# Patient Record
Sex: Male | Born: 1945 | Race: Black or African American | Hispanic: No | Marital: Married | Smoking: Former smoker
Health system: Southern US, Community
[De-identification: ages and names within clinical notes are randomized; demographics above are authoritative.]

## PROBLEM LIST (undated history)

## (undated) DIAGNOSIS — H409 Unspecified glaucoma: Secondary | ICD-10-CM

## (undated) DIAGNOSIS — M1990 Unspecified osteoarthritis, unspecified site: Secondary | ICD-10-CM

## (undated) DIAGNOSIS — Z9861 Coronary angioplasty status: Principal | ICD-10-CM

## (undated) DIAGNOSIS — E119 Type 2 diabetes mellitus without complications: Secondary | ICD-10-CM

## (undated) DIAGNOSIS — R112 Nausea with vomiting, unspecified: Secondary | ICD-10-CM

## (undated) DIAGNOSIS — R21 Rash and other nonspecific skin eruption: Secondary | ICD-10-CM

## (undated) DIAGNOSIS — C649 Malignant neoplasm of unspecified kidney, except renal pelvis: Secondary | ICD-10-CM

## (undated) DIAGNOSIS — R51 Headache: Secondary | ICD-10-CM

## (undated) DIAGNOSIS — N189 Chronic kidney disease, unspecified: Secondary | ICD-10-CM

## (undated) DIAGNOSIS — IMO0001 Reserved for inherently not codable concepts without codable children: Secondary | ICD-10-CM

## (undated) DIAGNOSIS — R011 Cardiac murmur, unspecified: Secondary | ICD-10-CM

## (undated) DIAGNOSIS — Z87442 Personal history of urinary calculi: Secondary | ICD-10-CM

## (undated) DIAGNOSIS — C61 Malignant neoplasm of prostate: Secondary | ICD-10-CM

## (undated) DIAGNOSIS — M109 Gout, unspecified: Secondary | ICD-10-CM

## (undated) DIAGNOSIS — I712 Thoracic aortic aneurysm, without rupture: Secondary | ICD-10-CM

## (undated) DIAGNOSIS — E785 Hyperlipidemia, unspecified: Secondary | ICD-10-CM

## (undated) DIAGNOSIS — Z9989 Dependence on other enabling machines and devices: Secondary | ICD-10-CM

## (undated) DIAGNOSIS — I2510 Atherosclerotic heart disease of native coronary artery without angina pectoris: Secondary | ICD-10-CM

## (undated) DIAGNOSIS — Z9889 Other specified postprocedural states: Secondary | ICD-10-CM

## (undated) DIAGNOSIS — R519 Headache, unspecified: Secondary | ICD-10-CM

## (undated) DIAGNOSIS — G4733 Obstructive sleep apnea (adult) (pediatric): Secondary | ICD-10-CM

## (undated) DIAGNOSIS — I10 Essential (primary) hypertension: Secondary | ICD-10-CM

## (undated) HISTORY — DX: Gout, unspecified: M10.9

## (undated) HISTORY — DX: Coronary angioplasty status: Z98.61

## (undated) HISTORY — DX: Unspecified glaucoma: H40.9

## (undated) HISTORY — DX: Obstructive sleep apnea (adult) (pediatric): G47.33

## (undated) HISTORY — PX: EYE SURGERY: SHX253

## (undated) HISTORY — DX: Reserved for inherently not codable concepts without codable children: IMO0001

## (undated) HISTORY — DX: Dependence on other enabling machines and devices: Z99.89

## (undated) HISTORY — DX: Essential (primary) hypertension: I10

## (undated) HISTORY — PX: NM MYOVIEW (ARMC HX): HXRAD1857

## (undated) HISTORY — PX: PROSTATECTOMY: SHX69

## (undated) HISTORY — PX: COLONOSCOPY W/ BIOPSIES AND POLYPECTOMY: SHX1376

## (undated) HISTORY — DX: Atherosclerotic heart disease of native coronary artery without angina pectoris: I25.10

## (undated) HISTORY — DX: Hyperlipidemia, unspecified: E78.5

## (undated) HISTORY — DX: Thoracic aortic aneurysm, without rupture: I71.2

## (undated) HISTORY — PX: REFRACTIVE SURGERY: SHX103

---

## 1999-11-07 HISTORY — PX: CORONARY ANGIOPLASTY WITH STENT PLACEMENT: SHX49

## 2004-08-27 ENCOUNTER — Emergency Department (HOSPITAL_COMMUNITY): Admission: EM | Admit: 2004-08-27 | Discharge: 2004-08-27 | Payer: Self-pay | Admitting: Emergency Medicine

## 2005-11-06 DIAGNOSIS — C61 Malignant neoplasm of prostate: Secondary | ICD-10-CM

## 2005-11-06 HISTORY — DX: Malignant neoplasm of prostate: C61

## 2008-01-23 ENCOUNTER — Emergency Department (HOSPITAL_COMMUNITY): Admission: EM | Admit: 2008-01-23 | Discharge: 2008-01-23 | Payer: Self-pay | Admitting: Emergency Medicine

## 2009-11-06 DIAGNOSIS — I2510 Atherosclerotic heart disease of native coronary artery without angina pectoris: Secondary | ICD-10-CM

## 2009-11-06 HISTORY — DX: Atherosclerotic heart disease of native coronary artery without angina pectoris: I25.10

## 2009-11-06 HISTORY — PX: CORONARY ANGIOPLASTY WITH STENT PLACEMENT: SHX49

## 2011-11-10 DIAGNOSIS — E119 Type 2 diabetes mellitus without complications: Secondary | ICD-10-CM | POA: Diagnosis not present

## 2011-11-16 DIAGNOSIS — M25569 Pain in unspecified knee: Secondary | ICD-10-CM | POA: Diagnosis not present

## 2011-11-16 DIAGNOSIS — M25849 Other specified joint disorders, unspecified hand: Secondary | ICD-10-CM | POA: Diagnosis not present

## 2011-11-16 DIAGNOSIS — S6980XA Other specified injuries of unspecified wrist, hand and finger(s), initial encounter: Secondary | ICD-10-CM | POA: Diagnosis not present

## 2011-11-16 DIAGNOSIS — E291 Testicular hypofunction: Secondary | ICD-10-CM | POA: Diagnosis not present

## 2011-11-16 DIAGNOSIS — M25469 Effusion, unspecified knee: Secondary | ICD-10-CM | POA: Diagnosis not present

## 2011-11-16 DIAGNOSIS — E119 Type 2 diabetes mellitus without complications: Secondary | ICD-10-CM | POA: Diagnosis not present

## 2011-11-16 DIAGNOSIS — M25869 Other specified joint disorders, unspecified knee: Secondary | ICD-10-CM | POA: Diagnosis not present

## 2011-11-16 DIAGNOSIS — M25549 Pain in joints of unspecified hand: Secondary | ICD-10-CM | POA: Diagnosis not present

## 2011-12-08 DIAGNOSIS — H01009 Unspecified blepharitis unspecified eye, unspecified eyelid: Secondary | ICD-10-CM | POA: Diagnosis not present

## 2011-12-08 DIAGNOSIS — H01119 Allergic dermatitis of unspecified eye, unspecified eyelid: Secondary | ICD-10-CM | POA: Diagnosis not present

## 2011-12-08 DIAGNOSIS — H02409 Unspecified ptosis of unspecified eyelid: Secondary | ICD-10-CM | POA: Diagnosis not present

## 2011-12-25 DIAGNOSIS — E291 Testicular hypofunction: Secondary | ICD-10-CM | POA: Diagnosis not present

## 2011-12-25 DIAGNOSIS — E789 Disorder of lipoprotein metabolism, unspecified: Secondary | ICD-10-CM | POA: Diagnosis not present

## 2011-12-25 DIAGNOSIS — E119 Type 2 diabetes mellitus without complications: Secondary | ICD-10-CM | POA: Diagnosis not present

## 2011-12-25 DIAGNOSIS — R05 Cough: Secondary | ICD-10-CM | POA: Diagnosis not present

## 2011-12-27 DIAGNOSIS — C61 Malignant neoplasm of prostate: Secondary | ICD-10-CM | POA: Diagnosis not present

## 2011-12-27 DIAGNOSIS — N529 Male erectile dysfunction, unspecified: Secondary | ICD-10-CM | POA: Diagnosis not present

## 2012-01-01 DIAGNOSIS — E119 Type 2 diabetes mellitus without complications: Secondary | ICD-10-CM | POA: Diagnosis not present

## 2012-01-01 DIAGNOSIS — R05 Cough: Secondary | ICD-10-CM | POA: Diagnosis not present

## 2012-01-18 DIAGNOSIS — E119 Type 2 diabetes mellitus without complications: Secondary | ICD-10-CM | POA: Diagnosis not present

## 2012-01-19 DIAGNOSIS — E789 Disorder of lipoprotein metabolism, unspecified: Secondary | ICD-10-CM | POA: Diagnosis not present

## 2012-01-19 DIAGNOSIS — E291 Testicular hypofunction: Secondary | ICD-10-CM | POA: Diagnosis not present

## 2012-01-19 DIAGNOSIS — I10 Essential (primary) hypertension: Secondary | ICD-10-CM | POA: Diagnosis not present

## 2012-01-19 DIAGNOSIS — E119 Type 2 diabetes mellitus without complications: Secondary | ICD-10-CM | POA: Diagnosis not present

## 2012-01-30 DIAGNOSIS — T380X5A Adverse effect of glucocorticoids and synthetic analogues, initial encounter: Secondary | ICD-10-CM | POA: Diagnosis not present

## 2012-01-30 DIAGNOSIS — H35039 Hypertensive retinopathy, unspecified eye: Secondary | ICD-10-CM | POA: Diagnosis not present

## 2012-01-30 DIAGNOSIS — H4060X0 Glaucoma secondary to drugs, unspecified eye, stage unspecified: Secondary | ICD-10-CM | POA: Diagnosis not present

## 2012-01-30 DIAGNOSIS — H01009 Unspecified blepharitis unspecified eye, unspecified eyelid: Secondary | ICD-10-CM | POA: Diagnosis not present

## 2012-01-30 DIAGNOSIS — E119 Type 2 diabetes mellitus without complications: Secondary | ICD-10-CM | POA: Diagnosis not present

## 2012-01-30 DIAGNOSIS — H2510 Age-related nuclear cataract, unspecified eye: Secondary | ICD-10-CM | POA: Diagnosis not present

## 2012-02-01 DIAGNOSIS — H04129 Dry eye syndrome of unspecified lacrimal gland: Secondary | ICD-10-CM | POA: Diagnosis not present

## 2012-02-01 DIAGNOSIS — H401390 Pigmentary glaucoma, unspecified eye, stage unspecified: Secondary | ICD-10-CM | POA: Diagnosis not present

## 2012-02-01 DIAGNOSIS — H409 Unspecified glaucoma: Secondary | ICD-10-CM | POA: Diagnosis not present

## 2012-02-01 DIAGNOSIS — H2510 Age-related nuclear cataract, unspecified eye: Secondary | ICD-10-CM | POA: Diagnosis not present

## 2012-02-01 DIAGNOSIS — E119 Type 2 diabetes mellitus without complications: Secondary | ICD-10-CM | POA: Diagnosis not present

## 2012-02-15 DIAGNOSIS — H04129 Dry eye syndrome of unspecified lacrimal gland: Secondary | ICD-10-CM | POA: Diagnosis not present

## 2012-02-15 DIAGNOSIS — H409 Unspecified glaucoma: Secondary | ICD-10-CM | POA: Diagnosis not present

## 2012-02-15 DIAGNOSIS — H02409 Unspecified ptosis of unspecified eyelid: Secondary | ICD-10-CM | POA: Diagnosis not present

## 2012-02-15 DIAGNOSIS — H401390 Pigmentary glaucoma, unspecified eye, stage unspecified: Secondary | ICD-10-CM | POA: Diagnosis not present

## 2012-02-29 DIAGNOSIS — H40019 Open angle with borderline findings, low risk, unspecified eye: Secondary | ICD-10-CM | POA: Diagnosis not present

## 2012-02-29 DIAGNOSIS — H2510 Age-related nuclear cataract, unspecified eye: Secondary | ICD-10-CM | POA: Diagnosis not present

## 2012-02-29 DIAGNOSIS — H01009 Unspecified blepharitis unspecified eye, unspecified eyelid: Secondary | ICD-10-CM | POA: Diagnosis not present

## 2012-03-21 DIAGNOSIS — H409 Unspecified glaucoma: Secondary | ICD-10-CM | POA: Diagnosis not present

## 2012-03-21 DIAGNOSIS — H401390 Pigmentary glaucoma, unspecified eye, stage unspecified: Secondary | ICD-10-CM | POA: Diagnosis not present

## 2012-04-18 DIAGNOSIS — H01009 Unspecified blepharitis unspecified eye, unspecified eyelid: Secondary | ICD-10-CM | POA: Diagnosis not present

## 2012-04-18 DIAGNOSIS — H11159 Pinguecula, unspecified eye: Secondary | ICD-10-CM | POA: Diagnosis not present

## 2012-04-18 DIAGNOSIS — H40059 Ocular hypertension, unspecified eye: Secondary | ICD-10-CM | POA: Diagnosis not present

## 2012-04-18 DIAGNOSIS — H401390 Pigmentary glaucoma, unspecified eye, stage unspecified: Secondary | ICD-10-CM | POA: Diagnosis not present

## 2012-04-22 DIAGNOSIS — I700 Atherosclerosis of aorta: Secondary | ICD-10-CM | POA: Diagnosis not present

## 2012-04-22 DIAGNOSIS — R918 Other nonspecific abnormal finding of lung field: Secondary | ICD-10-CM | POA: Diagnosis not present

## 2012-04-22 DIAGNOSIS — E1169 Type 2 diabetes mellitus with other specified complication: Secondary | ICD-10-CM | POA: Diagnosis not present

## 2012-04-22 DIAGNOSIS — R079 Chest pain, unspecified: Secondary | ICD-10-CM | POA: Diagnosis not present

## 2012-04-22 DIAGNOSIS — I2510 Atherosclerotic heart disease of native coronary artery without angina pectoris: Secondary | ICD-10-CM | POA: Diagnosis not present

## 2012-04-25 DIAGNOSIS — R0602 Shortness of breath: Secondary | ICD-10-CM | POA: Diagnosis not present

## 2012-04-25 DIAGNOSIS — R079 Chest pain, unspecified: Secondary | ICD-10-CM | POA: Diagnosis not present

## 2012-04-25 DIAGNOSIS — I10 Essential (primary) hypertension: Secondary | ICD-10-CM | POA: Diagnosis not present

## 2012-04-25 DIAGNOSIS — E782 Mixed hyperlipidemia: Secondary | ICD-10-CM | POA: Diagnosis not present

## 2012-05-01 DIAGNOSIS — E119 Type 2 diabetes mellitus without complications: Secondary | ICD-10-CM | POA: Diagnosis not present

## 2012-05-01 DIAGNOSIS — I2510 Atherosclerotic heart disease of native coronary artery without angina pectoris: Secondary | ICD-10-CM | POA: Diagnosis not present

## 2012-05-01 DIAGNOSIS — R079 Chest pain, unspecified: Secondary | ICD-10-CM | POA: Diagnosis not present

## 2012-05-01 DIAGNOSIS — R0602 Shortness of breath: Secondary | ICD-10-CM | POA: Diagnosis not present

## 2012-05-06 DIAGNOSIS — I2510 Atherosclerotic heart disease of native coronary artery without angina pectoris: Secondary | ICD-10-CM | POA: Diagnosis not present

## 2012-05-06 DIAGNOSIS — I10 Essential (primary) hypertension: Secondary | ICD-10-CM | POA: Diagnosis not present

## 2012-05-15 DIAGNOSIS — E789 Disorder of lipoprotein metabolism, unspecified: Secondary | ICD-10-CM | POA: Diagnosis not present

## 2012-05-16 DIAGNOSIS — H40059 Ocular hypertension, unspecified eye: Secondary | ICD-10-CM | POA: Diagnosis not present

## 2012-05-22 DIAGNOSIS — I10 Essential (primary) hypertension: Secondary | ICD-10-CM | POA: Diagnosis not present

## 2012-05-22 DIAGNOSIS — E789 Disorder of lipoprotein metabolism, unspecified: Secondary | ICD-10-CM | POA: Diagnosis not present

## 2012-05-22 DIAGNOSIS — E119 Type 2 diabetes mellitus without complications: Secondary | ICD-10-CM | POA: Diagnosis not present

## 2012-05-22 DIAGNOSIS — I509 Heart failure, unspecified: Secondary | ICD-10-CM | POA: Diagnosis not present

## 2012-05-28 DIAGNOSIS — R1310 Dysphagia, unspecified: Secondary | ICD-10-CM | POA: Diagnosis not present

## 2012-06-20 DIAGNOSIS — E119 Type 2 diabetes mellitus without complications: Secondary | ICD-10-CM | POA: Diagnosis not present

## 2012-06-20 DIAGNOSIS — E789 Disorder of lipoprotein metabolism, unspecified: Secondary | ICD-10-CM | POA: Diagnosis not present

## 2012-06-27 DIAGNOSIS — N529 Male erectile dysfunction, unspecified: Secondary | ICD-10-CM | POA: Diagnosis not present

## 2012-06-27 DIAGNOSIS — I10 Essential (primary) hypertension: Secondary | ICD-10-CM | POA: Diagnosis not present

## 2012-06-27 DIAGNOSIS — C61 Malignant neoplasm of prostate: Secondary | ICD-10-CM | POA: Diagnosis not present

## 2012-06-27 DIAGNOSIS — R350 Frequency of micturition: Secondary | ICD-10-CM | POA: Diagnosis not present

## 2012-06-27 DIAGNOSIS — E789 Disorder of lipoprotein metabolism, unspecified: Secondary | ICD-10-CM | POA: Diagnosis not present

## 2012-06-27 DIAGNOSIS — N318 Other neuromuscular dysfunction of bladder: Secondary | ICD-10-CM | POA: Diagnosis not present

## 2012-06-27 DIAGNOSIS — R3915 Urgency of urination: Secondary | ICD-10-CM | POA: Diagnosis not present

## 2012-06-27 DIAGNOSIS — E119 Type 2 diabetes mellitus without complications: Secondary | ICD-10-CM | POA: Diagnosis not present

## 2012-07-04 DIAGNOSIS — C61 Malignant neoplasm of prostate: Secondary | ICD-10-CM | POA: Diagnosis not present

## 2012-07-10 DIAGNOSIS — R195 Other fecal abnormalities: Secondary | ICD-10-CM | POA: Diagnosis not present

## 2012-07-10 DIAGNOSIS — R0602 Shortness of breath: Secondary | ICD-10-CM | POA: Diagnosis not present

## 2012-07-10 DIAGNOSIS — Z86010 Personal history of colonic polyps: Secondary | ICD-10-CM | POA: Diagnosis not present

## 2012-07-17 DIAGNOSIS — H409 Unspecified glaucoma: Secondary | ICD-10-CM | POA: Diagnosis not present

## 2012-07-17 DIAGNOSIS — H401390 Pigmentary glaucoma, unspecified eye, stage unspecified: Secondary | ICD-10-CM | POA: Diagnosis not present

## 2012-08-13 DIAGNOSIS — R1310 Dysphagia, unspecified: Secondary | ICD-10-CM | POA: Diagnosis not present

## 2012-08-21 DIAGNOSIS — E789 Disorder of lipoprotein metabolism, unspecified: Secondary | ICD-10-CM | POA: Diagnosis not present

## 2012-08-21 DIAGNOSIS — E119 Type 2 diabetes mellitus without complications: Secondary | ICD-10-CM | POA: Diagnosis not present

## 2012-08-21 DIAGNOSIS — I10 Essential (primary) hypertension: Secondary | ICD-10-CM | POA: Diagnosis not present

## 2012-08-21 DIAGNOSIS — E291 Testicular hypofunction: Secondary | ICD-10-CM | POA: Diagnosis not present

## 2012-08-28 DIAGNOSIS — L909 Atrophic disorder of skin, unspecified: Secondary | ICD-10-CM | POA: Diagnosis not present

## 2012-08-28 DIAGNOSIS — E789 Disorder of lipoprotein metabolism, unspecified: Secondary | ICD-10-CM | POA: Diagnosis not present

## 2012-08-28 DIAGNOSIS — E119 Type 2 diabetes mellitus without complications: Secondary | ICD-10-CM | POA: Diagnosis not present

## 2012-08-28 DIAGNOSIS — L919 Hypertrophic disorder of the skin, unspecified: Secondary | ICD-10-CM | POA: Diagnosis not present

## 2012-08-28 DIAGNOSIS — Z23 Encounter for immunization: Secondary | ICD-10-CM | POA: Diagnosis not present

## 2012-08-28 DIAGNOSIS — E291 Testicular hypofunction: Secondary | ICD-10-CM | POA: Diagnosis not present

## 2012-08-28 DIAGNOSIS — I10 Essential (primary) hypertension: Secondary | ICD-10-CM | POA: Diagnosis not present

## 2012-09-25 DIAGNOSIS — I10 Essential (primary) hypertension: Secondary | ICD-10-CM | POA: Diagnosis not present

## 2012-10-23 DIAGNOSIS — I10 Essential (primary) hypertension: Secondary | ICD-10-CM | POA: Diagnosis not present

## 2012-10-25 DIAGNOSIS — E119 Type 2 diabetes mellitus without complications: Secondary | ICD-10-CM | POA: Diagnosis not present

## 2012-10-25 DIAGNOSIS — I10 Essential (primary) hypertension: Secondary | ICD-10-CM | POA: Diagnosis not present

## 2012-10-25 DIAGNOSIS — E789 Disorder of lipoprotein metabolism, unspecified: Secondary | ICD-10-CM | POA: Diagnosis not present

## 2012-11-20 DIAGNOSIS — I10 Essential (primary) hypertension: Secondary | ICD-10-CM | POA: Diagnosis not present

## 2012-11-20 DIAGNOSIS — Z9861 Coronary angioplasty status: Secondary | ICD-10-CM | POA: Diagnosis not present

## 2012-11-20 DIAGNOSIS — E782 Mixed hyperlipidemia: Secondary | ICD-10-CM | POA: Diagnosis not present

## 2012-11-20 DIAGNOSIS — I2510 Atherosclerotic heart disease of native coronary artery without angina pectoris: Secondary | ICD-10-CM | POA: Diagnosis not present

## 2012-12-10 DIAGNOSIS — K625 Hemorrhage of anus and rectum: Secondary | ICD-10-CM | POA: Diagnosis not present

## 2012-12-10 DIAGNOSIS — R079 Chest pain, unspecified: Secondary | ICD-10-CM | POA: Diagnosis not present

## 2012-12-10 DIAGNOSIS — I10 Essential (primary) hypertension: Secondary | ICD-10-CM | POA: Diagnosis not present

## 2013-01-01 DIAGNOSIS — E789 Disorder of lipoprotein metabolism, unspecified: Secondary | ICD-10-CM | POA: Diagnosis not present

## 2013-01-01 DIAGNOSIS — G4730 Sleep apnea, unspecified: Secondary | ICD-10-CM | POA: Diagnosis not present

## 2013-01-01 DIAGNOSIS — I10 Essential (primary) hypertension: Secondary | ICD-10-CM | POA: Diagnosis not present

## 2013-01-01 DIAGNOSIS — E119 Type 2 diabetes mellitus without complications: Secondary | ICD-10-CM | POA: Diagnosis not present

## 2013-01-14 DIAGNOSIS — K921 Melena: Secondary | ICD-10-CM | POA: Diagnosis not present

## 2013-01-14 DIAGNOSIS — R195 Other fecal abnormalities: Secondary | ICD-10-CM | POA: Diagnosis not present

## 2013-02-05 DIAGNOSIS — G4733 Obstructive sleep apnea (adult) (pediatric): Secondary | ICD-10-CM | POA: Diagnosis not present

## 2013-02-18 DIAGNOSIS — G4733 Obstructive sleep apnea (adult) (pediatric): Secondary | ICD-10-CM | POA: Diagnosis not present

## 2013-03-05 DIAGNOSIS — K625 Hemorrhage of anus and rectum: Secondary | ICD-10-CM | POA: Diagnosis not present

## 2013-03-05 DIAGNOSIS — K219 Gastro-esophageal reflux disease without esophagitis: Secondary | ICD-10-CM | POA: Diagnosis not present

## 2013-03-05 DIAGNOSIS — M545 Low back pain: Secondary | ICD-10-CM | POA: Diagnosis not present

## 2013-03-12 DIAGNOSIS — G4733 Obstructive sleep apnea (adult) (pediatric): Secondary | ICD-10-CM | POA: Diagnosis not present

## 2013-03-14 DIAGNOSIS — H4010X0 Unspecified open-angle glaucoma, stage unspecified: Secondary | ICD-10-CM | POA: Diagnosis not present

## 2013-03-26 DIAGNOSIS — E789 Disorder of lipoprotein metabolism, unspecified: Secondary | ICD-10-CM | POA: Diagnosis not present

## 2013-03-28 DIAGNOSIS — H4010X0 Unspecified open-angle glaucoma, stage unspecified: Secondary | ICD-10-CM | POA: Diagnosis not present

## 2013-04-02 DIAGNOSIS — E119 Type 2 diabetes mellitus without complications: Secondary | ICD-10-CM | POA: Diagnosis not present

## 2013-04-02 DIAGNOSIS — I10 Essential (primary) hypertension: Secondary | ICD-10-CM | POA: Diagnosis not present

## 2013-04-02 DIAGNOSIS — G4730 Sleep apnea, unspecified: Secondary | ICD-10-CM | POA: Diagnosis not present

## 2013-04-14 DIAGNOSIS — H4010X0 Unspecified open-angle glaucoma, stage unspecified: Secondary | ICD-10-CM | POA: Diagnosis not present

## 2013-05-05 DIAGNOSIS — H4010X0 Unspecified open-angle glaucoma, stage unspecified: Secondary | ICD-10-CM | POA: Diagnosis not present

## 2013-06-04 ENCOUNTER — Ambulatory Visit (INDEPENDENT_AMBULATORY_CARE_PROVIDER_SITE_OTHER): Payer: Medicare Other | Admitting: Cardiology

## 2013-06-04 ENCOUNTER — Encounter: Payer: Self-pay | Admitting: Cardiology

## 2013-06-04 VITALS — BP 134/70 | HR 60 | Ht 72.0 in | Wt 275.5 lb

## 2013-06-04 DIAGNOSIS — Z9861 Coronary angioplasty status: Secondary | ICD-10-CM | POA: Diagnosis not present

## 2013-06-04 DIAGNOSIS — R0609 Other forms of dyspnea: Secondary | ICD-10-CM

## 2013-06-04 DIAGNOSIS — E119 Type 2 diabetes mellitus without complications: Secondary | ICD-10-CM

## 2013-06-04 DIAGNOSIS — G4733 Obstructive sleep apnea (adult) (pediatric): Secondary | ICD-10-CM | POA: Diagnosis not present

## 2013-06-04 DIAGNOSIS — I10 Essential (primary) hypertension: Secondary | ICD-10-CM

## 2013-06-04 DIAGNOSIS — R0989 Other specified symptoms and signs involving the circulatory and respiratory systems: Secondary | ICD-10-CM | POA: Diagnosis not present

## 2013-06-04 DIAGNOSIS — R0600 Dyspnea, unspecified: Secondary | ICD-10-CM

## 2013-06-04 DIAGNOSIS — E669 Obesity, unspecified: Secondary | ICD-10-CM

## 2013-06-04 DIAGNOSIS — I2510 Atherosclerotic heart disease of native coronary artery without angina pectoris: Secondary | ICD-10-CM

## 2013-06-04 DIAGNOSIS — Z794 Long term (current) use of insulin: Secondary | ICD-10-CM

## 2013-06-04 DIAGNOSIS — E785 Hyperlipidemia, unspecified: Secondary | ICD-10-CM

## 2013-06-04 NOTE — Patient Instructions (Addendum)
From a heart standpoint, your seem to be doing OK.  Your Blood Pressure is stable.  The swelling in your R ankle seems to be more related to a joint problem than fluid build up.   I am concerned about your weight gain.  We talked about some diet modifications, I would like for you to follow up with one of My PAs - Mr. Wilburt Finlay in ~3 months, then me in 6 months.  We are hoping to see some progress with decreasing your wgt.  He is a good motivator.  HARDING,DAVID W

## 2013-06-05 ENCOUNTER — Encounter: Payer: Self-pay | Admitting: Cardiology

## 2013-06-05 DIAGNOSIS — Z9989 Dependence on other enabling machines and devices: Secondary | ICD-10-CM | POA: Insufficient documentation

## 2013-06-05 DIAGNOSIS — I119 Hypertensive heart disease without heart failure: Secondary | ICD-10-CM | POA: Insufficient documentation

## 2013-06-05 DIAGNOSIS — E785 Hyperlipidemia, unspecified: Secondary | ICD-10-CM | POA: Insufficient documentation

## 2013-06-05 DIAGNOSIS — Z9861 Coronary angioplasty status: Secondary | ICD-10-CM | POA: Insufficient documentation

## 2013-06-05 DIAGNOSIS — R0600 Dyspnea, unspecified: Secondary | ICD-10-CM | POA: Insufficient documentation

## 2013-06-05 DIAGNOSIS — Z794 Long term (current) use of insulin: Secondary | ICD-10-CM | POA: Insufficient documentation

## 2013-06-05 DIAGNOSIS — I2510 Atherosclerotic heart disease of native coronary artery without angina pectoris: Secondary | ICD-10-CM | POA: Insufficient documentation

## 2013-06-05 DIAGNOSIS — E669 Obesity, unspecified: Secondary | ICD-10-CM | POA: Insufficient documentation

## 2013-06-05 DIAGNOSIS — E119 Type 2 diabetes mellitus without complications: Secondary | ICD-10-CM | POA: Insufficient documentation

## 2013-06-05 NOTE — Assessment & Plan Note (Signed)
He is on Vytorin and TriCor, prevertebral to be well-controlled. 3 diabetes mellitus primary physician. As close his ear was last checked about a minute good idea to check and a marked cystocele or stand. The anterior cruciate ligament was low before -- which should be helped with his increased activity his been doing.

## 2013-06-05 NOTE — Assessment & Plan Note (Signed)
Not really sure what to make of what this is. His EF was normal by the nuclear scan. The shortness really has gotten notable when he started putting on weight he is now about 24 pounds at what was last summer. I think some of these cysts shortness of breath when bending over or when just sitting there is do to his increased abdominal girth. When asked as substrate he was able to be breath following, this would insinuate that bending over causes some external restrictive problems, decreased ability of diaphragmatic contraction.Marland Kitchen

## 2013-06-05 NOTE — Assessment & Plan Note (Signed)
Stable, no active anginal symptoms. He is on a beta blocker, calcium channel blocker and ARB. He is also on statin and Plavix. He had been on aspirin so not sure why it was not listed. Stress test last year with no evidence of ischemia, excellent exercise tolerance.  Plan: Continue current therapy with a gush of risk modification.

## 2013-06-05 NOTE — Assessment & Plan Note (Signed)
His blood pressure was much better than it had been in the past. He is just on the borderline blood pressure today for his diabetes. His R. great dose of Bystolic and I think max dose of the Azor. When he is not on as a diuretic other than furosemide. Since he doesn't have that much leg edema, if his blood pressure increased to get this to switch furosemide for chlorthalidone.

## 2013-06-05 NOTE — Assessment & Plan Note (Signed)
Last but not least there is a 23 pound weight gain. We talked a lot about dietary modifications including simply removing the majority of the carbohydrates in his diet that or not noted in fruits and vegetables. Especially the simple carbohydrates, that are simply free calories. Hopefully once his back starts feeling better in his knees are feeling better, he'll get a get back into his routine exercise. We did talk about possibly doing water aerobics type activities while they're still bothering him. He may very well benefit from nutrition counseling session.  With that in mind and have him followup with Mr. Wilburt Finlay, PA in about 3 months to discuss continued lifestyle modification with dietary adjustment and exercise regimen plan. If he is agreeable at that time, we could even potentially consider referral to nutritionist.

## 2013-06-05 NOTE — Progress Notes (Signed)
Patient ID: Jacob Berger, male   DOB: 1946-07-26, 67 y.o.   MRN: [id number]  PCP: Michiel Sites, MD  Clinic Note: Chief Complaint  Patient presents with  . 6 month visit    no chest pain, little bit sob, edema in ankle ,    HPI: Jacob Berger is a 67 y.o. male with a PMH below who presents today for followup of his coronary artery disease time difficult control hypertension as well as dyslipidemia. He is a former patient of Dr. Caprice Kluver, who has a history of single vessel coronary disease, with PCI to the PDA with Endeavor DES stent in 2011 while still in [place]. He has not had any real recurrent anginal symptoms since then. Myoview done last year that was also normal. Most notably his been continuing to gain weight and got slightly but more short of breath. Last summer he had really difficult to hypertension but has been much improved.  Interval History: I last saw him in January, at that time he would 12 pounds positive for the last visit. He was not aware of it, but is now definitely aware of having been 11 pounds from last visit. This the point now where he is short of breath bending over to tie his shoes. He also notices that his heart is take full deep breath in. He is to really tell most 5-6 days legal in the gym, but now is trying at least 2-3 days and they've been having a lot of back spasms lately along with some pain in his left knee. That's really limited his activity.  He does continue to note a little mild shortness of breath on exertion, is not at all associated with chest tightness or chest pressure that was in the restroom exertion. Not really any change from what it was before. He is feeling feeling he has less energy. He denies any PND, orthopnea or any significant edema besides the swelling in his left ankle that may be more related to his left knee pain. He has occasional palpitations, but otherwise denies any , lightheadedness or dizziness, no wooziness or  syncope/near-syncope. No TIA or amaurosis fugax symptoms. No melena, hematochezia or hematuria. He denies any claudication symptoms.  Past Medical History  Diagnosis Date  . CAD S/P percutaneous coronary angioplasty 2011    PCI of PDA Endeavor 2.5 mm x 12 mm DES - [facility]  . Obesity, Class II, BMI 35-39.9, with comorbidity     BMI 37  . Hypertension, essential   . OSA on CPAP   . Diabetes mellitus, type II, insulin dependent     No longer on insulin according to his current med list   . Dyslipidemia, goal LDL below 70   . Gout   . Glaucoma    Prior Cardiac Evaluation and Past Surgical History: Past Surgical History  Procedure Laterality Date  . Prostatectomy  1995  . Coronary angioplasty with stent placement  2001    Endeavor 2.5 mm x 12 mm DES - distal PDA ([facility])  . Nm myoview ltd  June 2013    Treadmill Myoview: 10 minutes, 12 METS; no ischemia infarction, EF 65%    Allergies  Allergen Reactions  . Accupril (Quinapril Hcl) Swelling    Mouth swelling    Current Outpatient Prescriptions  Medication Sig Dispense Refill  . allopurinol (ZYLOPRIM) 300 MG tablet Take 300 mg by mouth daily.      Marland Kitchen amLODipine-olmesartan (AZOR) 10-40 MG  per tablet Take 1 tablet by mouth daily.      Marland Kitchen aspirin EC 81 MG tablet Take 81 mg by mouth daily.      . Brinzolamide-Brimonidine (SIMBRINZA) 1-0.2 % SUSP Apply to eye. 1 gtt each eye twice a day      . clopidogrel (PLAVIX) 75 MG tablet Take 75 mg by mouth daily.      . diphenhydrAMINE (BENADRYL) 25 mg capsule Take 25 mg by mouth every 6 (six) hours as needed for itching.      . ezetimibe-simvastatin (VYTORIN) 10-40 MG per tablet Take 1 tablet by mouth at bedtime.      . fenofibrate (TRICOR) 145 MG tablet Take 145 mg by mouth daily.      . furosemide (LASIX) 20 MG tablet Take 20 mg by mouth.      . metFORMIN (GLUCOPHAGE) 500 MG tablet Take 500 mg by mouth 3 (three) times daily.      . naproxen sodium  (ANAPROX) 220 MG tablet Take 220 mg by mouth 2 (two) times daily with a meal.      . Nebivolol HCl (BYSTOLIC) 20 MG TABS Take by mouth.      . NON FORMULARY USES C-PAP      . OMEGA-3 KRILL OIL PO Take by mouth.      . timolol (TIMOPTIC) 0.5 % ophthalmic solution Place 1 drop into both eyes 2 (two) times daily.       No current facility-administered medications for this visit.   Was previously on Lantus 60 units each bedtime; aspirin 81 mg,   Azor dose was doubled from last visit   Social History Narrative   Married father of 3. Had been walking up to 2 miles every other day appetite is good his weight for about half an hour time period, but limited now due to back pain. Occasional alcohol. No tobacco products -- he quit about 20-30 years ago.    ROS: A comprehensive Review of Systems - Negative except Pertinent cardiology positives above. Other noncardiac symptoms below General ROS: positive for  - weight gain Musculoskeletal ROS: positive for - Low back spasm, knee pain and swelling mostly in the left knee, this is associated with swelling of the left ankle as well. Mild hemorrhoids with occasional blood when wiping.  PHYSICAL EXAM BP 134/70  Pulse 60  Ht 6' (1.829 m)  Wt 275 lb 8 oz (124.966 kg)  BMI 37.36 kg/m2 General appearance: alert and oriented x3, cooperative, appears stated age, no distress, moderately obese and well-groomed, well-nourished. Pleasant mood and affect. Answers questions properly. HEENT: [place]/AT, EOMI, MMM, anicteric sclera Neck: no adenopathy, no JVD, supple, symmetrical, trachea midline and he may have a soft right carotid bruit versus just radiation of his systolic murmur. Lungs: clear to auscultation bilaterally, normal percussion bilaterally and nonlabored with good air movement. Heart: regular rate and rhythm, S1, S2 normal, no S3 or S4, systolic murmur: systolic ejection 1/6, crescendo and decrescendo at 2nd right intercostal space, radiates to carotids,  no click and no rub Abdomen: soft, non-tender; bowel sounds normal; no masses,  no organomegaly and moderate obesity Extremities: extremities normal, atraumatic, no cyanosis or edema and the left knee is swollen but not warm, in a similar manner the left ankle is also swollen. There is no edema in between on the foot Pulses: 2+ and symmetric Neurologic: Grossly normal  [id number] today: Yes Rate: 60 , Rhythm: Normal sinus rhythm, normal ECG;    Recent Labs: None available  ASSESSMENT / PLAN:  CAD S/P percutaneous coronary angioplasty Stable, no active anginal symptoms. He is on a beta blocker, calcium channel blocker and ARB. He is also on statin and Plavix. He had been on aspirin so not sure why it was not listed. Stress test last year with no evidence of ischemia, excellent exercise tolerance.  Plan: Continue current therapy with a gush of risk modification.  Dyspnea - likely due to increasing obesity Not really sure what to make of what this is. His EF was normal by the nuclear scan. The shortness really has gotten notable when he started putting on weight he is now about 24 pounds at what was last summer. I think some of these cysts shortness of breath when bending over or when just sitting there is do to his increased abdominal girth. When asked as substrate he was able to be breath following, this would insinuate that bending over causes some external restrictive problems, decreased ability of diaphragmatic contraction..  Hypertension, essential His blood pressure was much better than it had been in the past. He is just on the borderline blood pressure today for his diabetes. His R. great dose of Bystolic and I think max dose of the Azor. When he is not on as a diuretic other than furosemide. Since he doesn't have that much leg edema, if his blood pressure increased to get this to switch furosemide for chlorthalidone.  Dyslipidemia, goal LDL below 70 He is on Vytorin and TriCor,  prevertebral to be well-controlled. 3 diabetes mellitus primary physician. As close his ear was last checked about a minute good idea to check and a marked cystocele or stand. The anterior cruciate ligament was low before -- which should be helped with his increased activity his been doing.  Obesity (BMI 30-39.9) Last but not least there is a 23 pound weight gain. We talked a lot about dietary modifications including simply removing the majority of the carbohydrates in his diet that or not noted in fruits and vegetables. Especially the simple carbohydrates, that are simply free calories. Hopefully once his back starts feeling better in his knees are feeling better, he'll get a get back into his routine exercise. We did talk about possibly doing water aerobics type activities while they're still bothering him. He may very well benefit from nutrition counseling session.  With that in mind and have him followup with Mr. Wilburt Finlay, PA in about 3 months to discuss continued lifestyle modification with dietary adjustment and exercise regimen plan. If he is agreeable at that time, we could even potentially consider referral to nutritionist.    Orders Placed This Encounter  Procedures  . EKG 12-Lead   Followup: Wilburt Finlay, PA - 3 months, with me in 6 months  Dylyn Mclaren W. Herbie Baltimore, M.D., M.S. [facility] [address] [place], [place]  [address]  [phone] Pager # [phone]

## 2013-07-25 DIAGNOSIS — H4010X0 Unspecified open-angle glaucoma, stage unspecified: Secondary | ICD-10-CM | POA: Diagnosis not present

## 2013-08-26 DIAGNOSIS — H4010X0 Unspecified open-angle glaucoma, stage unspecified: Secondary | ICD-10-CM | POA: Diagnosis not present

## 2013-08-27 ENCOUNTER — Ambulatory Visit (INDEPENDENT_AMBULATORY_CARE_PROVIDER_SITE_OTHER): Payer: Medicare Other | Admitting: Physician Assistant

## 2013-08-27 ENCOUNTER — Encounter: Payer: Self-pay | Admitting: Physician Assistant

## 2013-08-27 VITALS — BP 140/80 | HR 60 | Ht 72.0 in | Wt 279.0 lb

## 2013-08-27 DIAGNOSIS — E119 Type 2 diabetes mellitus without complications: Secondary | ICD-10-CM

## 2013-08-27 DIAGNOSIS — R079 Chest pain, unspecified: Secondary | ICD-10-CM

## 2013-08-27 DIAGNOSIS — E669 Obesity, unspecified: Secondary | ICD-10-CM

## 2013-08-27 DIAGNOSIS — Z79899 Other long term (current) drug therapy: Secondary | ICD-10-CM | POA: Diagnosis not present

## 2013-08-27 DIAGNOSIS — Z794 Long term (current) use of insulin: Secondary | ICD-10-CM

## 2013-08-27 DIAGNOSIS — Z125 Encounter for screening for malignant neoplasm of prostate: Secondary | ICD-10-CM | POA: Diagnosis not present

## 2013-08-27 DIAGNOSIS — I10 Essential (primary) hypertension: Secondary | ICD-10-CM

## 2013-08-27 DIAGNOSIS — E789 Disorder of lipoprotein metabolism, unspecified: Secondary | ICD-10-CM | POA: Diagnosis not present

## 2013-08-27 NOTE — Patient Instructions (Signed)
1.  I will refer you for Medical Nutrition Therapy with Amy Bena Kobel. 2. Increase cardio exercise to 60 minutes per day.  Do so in a gradual manner. 3.  I will call Dr. Juleen China to get a TSH lab. 4.  Follow up in 6 months with Dr. Herbie Baltimore or sooner if needed.

## 2013-08-27 NOTE — Assessment & Plan Note (Signed)
The pressures mildly elevated today. Patient is on therapy with nebivolol, and Azor.

## 2013-08-27 NOTE — Assessment & Plan Note (Signed)
A1c is being checked by Dr. Juleen China

## 2013-08-27 NOTE — Assessment & Plan Note (Addendum)
The patient seemed to be eager to lose weight.  We discussed exercises to add to his workout at the gym.  We also discussed consult for medical nutrition therapy.  This will be arranged as soon as possible.  He is having labs drawn by Dr. Juleen China. We'll see if we can add a TSH

## 2013-08-27 NOTE — Progress Notes (Signed)
Date:  08/27/2013   ID:  Jacob Berger, DOB October 15, 1946, MRN [id number]  PCP:  Michiel Sites, MD  Primary Cardiologist:  Herbie Baltimore     History of Present Illness: Jacob Berger is a 67 y.o. male with a history of coronary artery disease PCI to the PDA Endeavor DES, obesity, hypertension, obstructive sleep apnea on CPAP, diabetes mellitus type 2, dyslipidemia, gout, glaucoma.  Patient has noticed continued weight gain over the last year.   Patient presented today for evaluation to discuss weight loss options and exercise.  Incidentally he's noticed some chest pain recently with said the chest pain would occur sometimes when lying in bed.  Her, it didn't necessarily occur while he was exercising at the gym. Left arm pain was also exacerbated with weight lifting.  He reports some nausea, shortness of breath but otherwise denies vomiting, fever, orthopnea, dizziness, PND, cough, congestion, abdominal pain, hematochezia, melena, lower extremity edema, claudication.  Wt Readings from Last 3 Encounters:  08/27/13 279 lb (126.554 kg)  06/04/13 275 lb 8 oz (124.966 kg)     Past Medical History  Diagnosis Date  . CAD S/P percutaneous coronary angioplasty 2011    PCI of PDA Endeavor 2.5 mm x 12 mm DES - [facility]  . Obesity, Class II, BMI 35-39.9, with comorbidity     BMI 37  . Hypertension, essential   . OSA on CPAP   . Diabetes mellitus, type II, insulin dependent     No longer on insulin according to his current med list   . Dyslipidemia, goal LDL below 70   . Gout   . Glaucoma     Current Outpatient Prescriptions  Medication Sig Dispense Refill  . allopurinol (ZYLOPRIM) 300 MG tablet Take 300 mg by mouth daily.      Marland Kitchen amLODipine-olmesartan (AZOR) 10-40 MG per tablet Take 1 tablet by mouth daily.      Marland Kitchen aspirin EC 81 MG tablet Take 81 mg by mouth daily.      . Brinzolamide-Brimonidine (SIMBRINZA) 1-0.2 % SUSP Apply to eye. 1 gtt each eye twice a day      .  clopidogrel (PLAVIX) 75 MG tablet Take 75 mg by mouth daily.      . diphenhydrAMINE (BENADRYL) 25 mg capsule Take 25 mg by mouth every 6 (six) hours as needed for itching.      . ezetimibe-simvastatin (VYTORIN) 10-40 MG per tablet Take 1 tablet by mouth at bedtime.      . fenofibrate (TRICOR) 145 MG tablet Take 145 mg by mouth daily.      . furosemide (LASIX) 20 MG tablet Take 20 mg by mouth.      . metFORMIN (GLUCOPHAGE) 500 MG tablet Take 500 mg by mouth 3 (three) times daily.      . naproxen sodium (ANAPROX) 220 MG tablet Take 220 mg by mouth 2 (two) times daily with a meal.      . Nebivolol HCl (BYSTOLIC) 20 MG TABS Take by mouth.      . NON FORMULARY USES C-PAP      . OMEGA-3 KRILL OIL PO Take by mouth.      . timolol (TIMOPTIC) 0.5 % ophthalmic solution Place 1 drop into both eyes 2 (two) times daily.       No current facility-administered medications for this visit.    Allergies:    Allergies  Allergen Reactions  . Accupril [Quinapril Hcl] Swelling    Mouth swelling    Social  History:  The patient  reports that he quit smoking about 20 years ago. His smoking use included Cigarettes. He smoked 0.00 packs per day. He does not have any smokeless tobacco history on file. He reports that he drinks alcohol. He reports that he does not use illicit drugs.   Family history:  History reviewed. No pertinent family history.  ROS:  Please see the history of present illness.  All other systems reviewed and negative.   PHYSICAL EXAM: VS:  BP 140/80  Pulse 60  Ht 6' (1.829 m)  Wt 279 lb (126.554 kg)  BMI 37.83 kg/m2 Obese, well developed, in no acute distress HEENT: Pupils are equal round react to light accommodation extraocular movements are intact.  Neck: no JVDNo cervical lymphadenopathy. Cardiac: Regular rate and rhythm 1/6 systolic murmur. Lungs:  clear to auscultation bilaterally, no wheezing, rhonchi or rales Abd: soft, nontender, positive bowel sounds all quadrants, no  hepatosplenomegaly Ext: Trace lower extremity edema.  2+ radial and dorsalis pedis pulses. Skin: warm and dry Neuro:  Grossly normal   ASSESSMENT AND PLAN:  Problem List Items Addressed This Visit   Obesity (BMI 30-39.9) (Chronic)     The patient seemed to be eager to lose weight.  We discussed exercises to add to his workout at the gym.  We also discussed consult for medical nutrition therapy.  This will be arranged as soon as possible.  He is having labs drawn by Dr. Juleen China. We'll see if we can add a TSH     Hypertension, essential (Chronic)     The pressures mildly elevated today. Patient is on therapy with nebivolol, and Azor.    Diabetes mellitus, type II, insulin dependent (Chronic)     A1c is being checked by Dr. Juleen China    Chest pain - Primary   Relevant Orders      EKG 12-Lead

## 2013-08-28 ENCOUNTER — Encounter: Payer: Self-pay | Admitting: Physician Assistant

## 2013-09-03 DIAGNOSIS — G479 Sleep disorder, unspecified: Secondary | ICD-10-CM | POA: Diagnosis not present

## 2013-09-03 DIAGNOSIS — R0609 Other forms of dyspnea: Secondary | ICD-10-CM | POA: Diagnosis not present

## 2013-09-03 DIAGNOSIS — I10 Essential (primary) hypertension: Secondary | ICD-10-CM | POA: Diagnosis not present

## 2013-09-03 DIAGNOSIS — M79609 Pain in unspecified limb: Secondary | ICD-10-CM | POA: Diagnosis not present

## 2013-09-03 DIAGNOSIS — M19079 Primary osteoarthritis, unspecified ankle and foot: Secondary | ICD-10-CM | POA: Diagnosis not present

## 2013-09-03 DIAGNOSIS — E119 Type 2 diabetes mellitus without complications: Secondary | ICD-10-CM | POA: Diagnosis not present

## 2013-09-05 DIAGNOSIS — H4010X0 Unspecified open-angle glaucoma, stage unspecified: Secondary | ICD-10-CM | POA: Diagnosis not present

## 2013-09-09 DIAGNOSIS — E119 Type 2 diabetes mellitus without complications: Secondary | ICD-10-CM | POA: Diagnosis not present

## 2013-09-10 DIAGNOSIS — Z23 Encounter for immunization: Secondary | ICD-10-CM | POA: Diagnosis not present

## 2013-10-27 DIAGNOSIS — Z23 Encounter for immunization: Secondary | ICD-10-CM | POA: Diagnosis not present

## 2013-12-18 ENCOUNTER — Other Ambulatory Visit: Payer: Self-pay | Admitting: Cardiology

## 2013-12-18 NOTE — Telephone Encounter (Signed)
Rx was sent to pharmacy electronically. 

## 2014-01-19 ENCOUNTER — Other Ambulatory Visit: Payer: Self-pay | Admitting: *Deleted

## 2014-01-19 MED ORDER — CLOPIDOGREL BISULFATE 75 MG PO TABS: 75.0000 mg | ORAL_TABLET | Freq: Every day | ORAL | Status: DC

## 2014-01-19 NOTE — Telephone Encounter (Signed)
Rx was sent to pharmacy electronically. 

## 2014-02-02 DIAGNOSIS — H4010X0 Unspecified open-angle glaucoma, stage unspecified: Secondary | ICD-10-CM | POA: Diagnosis not present

## 2014-03-17 DIAGNOSIS — G4733 Obstructive sleep apnea (adult) (pediatric): Secondary | ICD-10-CM | POA: Diagnosis not present

## 2014-04-14 DIAGNOSIS — H60399 Other infective otitis externa, unspecified ear: Secondary | ICD-10-CM | POA: Diagnosis not present

## 2014-06-02 DIAGNOSIS — M79609 Pain in unspecified limb: Secondary | ICD-10-CM | POA: Diagnosis not present

## 2014-06-02 DIAGNOSIS — R609 Edema, unspecified: Secondary | ICD-10-CM | POA: Diagnosis not present

## 2014-06-02 DIAGNOSIS — I10 Essential (primary) hypertension: Secondary | ICD-10-CM | POA: Diagnosis not present

## 2014-06-02 DIAGNOSIS — E119 Type 2 diabetes mellitus without complications: Secondary | ICD-10-CM | POA: Diagnosis not present

## 2014-07-15 ENCOUNTER — Other Ambulatory Visit: Payer: Self-pay | Admitting: *Deleted

## 2014-07-15 MED ORDER — CLOPIDOGREL BISULFATE 75 MG PO TABS: 75.0000 mg | ORAL_TABLET | Freq: Every day | ORAL | Status: DC

## 2014-07-15 NOTE — Telephone Encounter (Signed)
Rx refill sent to patient pharmacy   

## 2014-07-21 DIAGNOSIS — H4010X0 Unspecified open-angle glaucoma, stage unspecified: Secondary | ICD-10-CM | POA: Diagnosis not present

## 2014-08-06 ENCOUNTER — Encounter: Payer: Self-pay | Admitting: Cardiology

## 2014-08-06 ENCOUNTER — Ambulatory Visit (INDEPENDENT_AMBULATORY_CARE_PROVIDER_SITE_OTHER): Payer: Medicare Other | Admitting: Cardiology

## 2014-08-06 VITALS — BP 132/76 | HR 51 | Ht 72.0 in | Wt 273.3 lb

## 2014-08-06 DIAGNOSIS — Z9989 Dependence on other enabling machines and devices: Secondary | ICD-10-CM

## 2014-08-06 DIAGNOSIS — E785 Hyperlipidemia, unspecified: Secondary | ICD-10-CM | POA: Diagnosis not present

## 2014-08-06 DIAGNOSIS — I10 Essential (primary) hypertension: Secondary | ICD-10-CM

## 2014-08-06 DIAGNOSIS — E669 Obesity, unspecified: Secondary | ICD-10-CM

## 2014-08-06 DIAGNOSIS — Z9861 Coronary angioplasty status: Secondary | ICD-10-CM

## 2014-08-06 DIAGNOSIS — I2510 Atherosclerotic heart disease of native coronary artery without angina pectoris: Secondary | ICD-10-CM

## 2014-08-06 DIAGNOSIS — E119 Type 2 diabetes mellitus without complications: Secondary | ICD-10-CM

## 2014-08-06 DIAGNOSIS — G4733 Obstructive sleep apnea (adult) (pediatric): Secondary | ICD-10-CM | POA: Diagnosis not present

## 2014-08-06 DIAGNOSIS — Z794 Long term (current) use of insulin: Secondary | ICD-10-CM

## 2014-08-06 LAB — LIPID PANEL
Cholesterol: 109 mg/dL (ref 0–200)
HDL: 32 mg/dL — ABNORMAL LOW (ref >39)
LDL Cholesterol: 56 mg/dL (ref 0–99)
Total CHOL/HDL Ratio: 3.4 Ratio
Triglycerides: 106 mg/dL (ref <150)
VLDL: 21 mg/dL (ref 0–40)

## 2014-08-06 LAB — COMPREHENSIVE METABOLIC PANEL WITH GFR
ALT: 24 U/L (ref 0–53)
AST: 24 U/L (ref 0–37)
Albumin: 4.5 g/dL (ref 3.5–5.2)
Alkaline Phosphatase: 73 U/L (ref 39–117)
BUN: 17 mg/dL (ref 6–23)
CO2: 22 meq/L (ref 19–32)
Calcium: 9.8 mg/dL (ref 8.4–10.5)
Chloride: 106 meq/L (ref 96–112)
Creat: 1.11 mg/dL (ref 0.50–1.35)
Glucose, Bld: 156 mg/dL — ABNORMAL HIGH (ref 70–99)
Potassium: 4 meq/L (ref 3.5–5.3)
Sodium: 140 meq/L (ref 135–145)
Total Bilirubin: 0.5 mg/dL (ref 0.2–1.2)
Total Protein: 7.2 g/dL (ref 6.0–8.3)

## 2014-08-06 NOTE — Patient Instructions (Addendum)
LABS -CMP,LIPID -- may be able to go to just simvastatin from Vytorin for $ issues.  Your physician wants you to follow-up in [place]. You will receive a reminder letter in the mail two months in advance. If you don't receive a letter, please call our office to schedule the follow-up appointment.

## 2014-08-06 NOTE — Progress Notes (Signed)
PCP: Dwan Bolt, MD  Clinic Note: Chief Complaint  Patient presents with  . Follow-up    6 month; chest tightness, shortness of breath, ankle swelling, no dizziness    HPI: Jacob Berger is a 68 y.o. male with a PMH below who presents today for annual followup. I last saw him in July of 2014, but he saw Tenny Craw, [place]. in October of last year. The main focus at that visit was discussing weight loss and monitoring his blood pressure. He has a history of coronary disease -- he had sensation of chest discomfort and palpitations while in [place] in 2011 -- this is thought to be consistent with unstable angina and he underwent cardiac catheterization-PCI to the PDA with a DES stent.  He has not had any significant symptoms since.  Past Medical History  Diagnosis Date  . CAD S/P percutaneous coronary angioplasty 2011    PCI of PDA Endeavor 2.5 mm x 12 mm DES - [facility]  . Obesity, Class II, BMI 35-39.9, with comorbidity     BMI 37  . Hypertension, essential   . OSA on CPAP   . Diabetes mellitus, type II, insulin dependent     No longer on insulin according to his current med list   . Dyslipidemia, goal LDL below 70   . Gout   . Glaucoma     Prior Cardiac Evaluation and Past Surgical History: Past Surgical History  Procedure Laterality Date  . Prostatectomy  1995  . Coronary angioplasty with stent placement  2001    Endeavor 2.5 mm x 12 mm DES - distal PDA ([facility])  . Nm myoview ltd  June 2013    Treadmill Myoview: 10 minutes, 12 METS; no ischemia infarction, EF 65%    Interval History: He presents today doing "pretty good ". She denies any significant symptoms of palpitations or chest discomfort with rest or exertion.  He did have one episode of a marker so ago where he had some chest tightness that was subtle on his left side. He mostly does have some left shoulder pain from what he thinks is overuse injury. He denies any  resting or exertional dyspnea. No PND, orthopnea or edema. No palpitations, lightheadedness, dizziness, weakness or syncope/near syncope.  He tries to exercise about 4 or 5 days a week on the treadmill and elliptical as well as free weights and machines. Unfortunately he has not yet been able to lose weight. He is using his CPAP as much as he can.  ROS: A comprehensive Review of Systems - was performed Review of Systems  Constitutional: Negative for weight loss and malaise/fatigue.  HENT: Negative for nosebleeds.   Respiratory: Negative for cough, hemoptysis, sputum production, shortness of breath and wheezing.   Cardiovascular: Negative.  Negative for claudication.       Her history of present illness  Gastrointestinal: Negative for blood in stool and melena.  Genitourinary: Negative for dysuria and hematuria.  Musculoskeletal: Positive for joint pain.       Left shoulder pain; Intermittent gout pain in his ankles and knees. Left knee is most significant. He had a fall affecting his knee, he certainly has more swelling of his left knee.  Endo/Heme/Allergies: Does not bruise/bleed easily.  Psychiatric/Behavioral: Negative for depression. The patient is not nervous/anxious.   All other systems reviewed and are negative.  Current Outpatient Prescriptions on File Prior to Visit  Medication Sig Dispense Refill  . allopurinol (ZYLOPRIM) 300  MG tablet Take 300 mg by mouth daily.      Marland Kitchen aspirin EC 81 MG tablet Take 81 mg by mouth daily.      . AZOR 10-40 MG per tablet TAKE 1 TABLET BY MOUTH DAILY  90 tablet  2  . clopidogrel (PLAVIX) 75 MG tablet Take 1 tablet (75 mg total) by mouth daily.  30 tablet  0  . diphenhydrAMINE (BENADRYL) 25 mg capsule Take 25 mg by mouth every 6 (six) hours as needed for itching.      . fenofibrate (TRICOR) 145 MG tablet Take 145 mg by mouth daily.      . metFORMIN (GLUCOPHAGE) 500 MG tablet Take 500 mg by mouth 3 (three) times daily.      . naproxen sodium  (ANAPROX) 220 MG tablet Take 220 mg by mouth 2 (two) times daily with a meal.       No current facility-administered medications on file prior to visit.   ALLERGIES REVIEWED IN EPIC -- No change SOCIAL AND FAMILY HISTORY REVIEWED IN EPIC -- No change  Wt Readings from Last 3 Encounters:  08/06/14 273 lb 4.8 oz (123.968 kg)  08/27/13 279 lb (126.554 kg)  06/04/13 275 lb 8 oz (124.966 kg)    PHYSICAL EXAM BP 132/76  Pulse 51  Ht 6' (1.829 m)  Wt 273 lb 4.8 oz (123.968 kg)  BMI 37.06 kg/m2 General appearance: alert and oriented x3, cooperative, appears stated age, no distress, moderately obese and well-groomed, well-nourished. Pleasant mood and affect. Answers questions properly.  HEENT: [place]/AT, EOMI, MMM, anicteric sclera  Neck: no adenopathy, no JVD, supple, symmetrical, trachea midline and he may have a soft right carotid bruit versus just radiation of his systolic murmur.  Lungs: clear to auscultation bilaterally, normal percussion bilaterally and nonlabored with good air movement.  Heart: regular rate and rhythm, S1, S2 normal, no S3 or S4, systolic murmur: systolic ejection 1/6, crescendo and decrescendo at 2nd right intercostal space, radiates to carotids, no click and no rub  Abdomen: soft, non-tender; bowel sounds normal; no masses, no organomegaly and moderate obesity  Extremities: no C/C/E;  Pulses: 2+ and symmetric  Neurologic: Grossly normal   Adult ECG Report  Rate: 51 ;  Rhythm: sinus bradycardia , nonspecific ST and T wave changes,   Narrative Interpretation: No change from previous   Recent Labs:  No labs since last year   ASSESSMENT / PLAN: CAD S/P percutaneous coronary angioplasty He only had one episode a few months ago and otherwise has not had any chest discomfort since his PCI. With the fact that he is active as he has been with his exercise regimen and did not notice chest discomfort with that, I inclined to think that's probably not cardiac in nature. He is  on statin aspirin, Plavix, beta blocker and ARB.  If he has any bleeding issues he can stop the aspirin.  Dyslipidemia, goal LDL below 70 He is on a combination of statin plus Zetia (Vytorin) along with fenofibrate. No myalgias noted.  Plan: Recheck chemistry panel along with a lipid panel. He is asked about possibly converting from Vytorin to a different medication possibly because of financial concerns with his insurance coverage.-   Hypertension, essential Well controlled today on her medications.  Obesity (BMI 30-39.9) Again I talked about the importance of dietary modifications. He is doing plenty of exercise and he has lost some weight since his last visit. He is down from 279 pounds to 273 pounds.  OSA  on CPAP Tolerating CPAP  Diabetes mellitus, type II, insulin dependent Followed by PCP    Orders Placed This Encounter  Procedures  . Lipid panel    Order Specific Question:  Has the patient fasted?    Answer:  Yes  . Comprehensive metabolic panel    Order Specific Question:  Has the patient fasted?    Answer:  Yes  . EKG 12-Lead   Meds ordered this encounter  Medications  . dorzolamide-timolol (COSOPT) 22.3-6.8 MG/ML ophthalmic solution    Sig: Place 1 drop into both eyes 2 (two) times daily.  Marland Kitchen DISCONTD: furosemide (LASIX) 80 MG tablet    Sig: Take 80 mg by mouth.  . nebivolol (BYSTOLIC) 10 MG tablet    Sig: Take 10 mg by mouth 2 (two) times daily.  . travoprost, benzalkonium, (TRAVATAN) 0.004 % ophthalmic solution    Sig: Place 1 drop into both eyes at bedtime.  . Insulin Glargine (LANTUS SOLOSTAR [place])    Sig: Inject 55 Units into the skin at bedtime.  . furosemide (LASIX) 40 MG tablet    Sig: Take a 1/2 to 1 tablet a day as needed.  Marland Kitchen Cod Liver Oil w/Vit A & D CAPS    Sig: Take 1 capsule by mouth daily.  . Methylcellulose, Laxative, (CITRUCEL PO)    Sig: Take 1 tablet by mouth daily.    Followup: 6 months   HARDING,DAVID W, M.D., M.S. Interventional  Cardiologist   Pager # [phone]

## 2014-08-07 ENCOUNTER — Telehealth: Payer: Self-pay | Admitting: *Deleted

## 2014-08-07 DIAGNOSIS — Z79899 Other long term (current) drug therapy: Secondary | ICD-10-CM

## 2014-08-07 DIAGNOSIS — E785 Hyperlipidemia, unspecified: Secondary | ICD-10-CM

## 2014-08-07 MED ORDER — SIMVASTATIN 40 MG PO TABS: 40.0000 mg | ORAL_TABLET | Freq: Every day | ORAL | Status: DC

## 2014-08-07 NOTE — Telephone Encounter (Signed)
Left message to call back concerning lab results

## 2014-08-07 NOTE — Telephone Encounter (Signed)
Spoke with patient.  results given. Verbalized understanding. Patient had question about the possible change in cholesterol medication. RN informed patient will contact him back after Dr Ellyn Hack reviews

## 2014-08-07 NOTE — Telephone Encounter (Signed)
Can switch to Simvastatin alone -- will recheck in 6 months.  If need more control - will convert to atorvastatin.  Leonie Man, MD

## 2014-08-07 NOTE — Telephone Encounter (Signed)
Informed patient he may start Simvastatin 40 mg after completing Vytorin E -SENT MEDICATION SIMVASTAIN 40 MG WILL RECHECK IN 6 MONTHS LIPID,LIVER PATIENT VERBALIZED UNDERSTANDING.

## 2014-08-07 NOTE — Telephone Encounter (Signed)
Message copied by Raiford Simmonds on Fri Aug 07, 2014 10:44 AM ------      Message from: Leonie Man      Created: Thu Aug 06, 2014  9:50 PM       Labs overall are Good -- HDL (good cholesterol is a bit low, but this corresponds to overall low cholesterol.  Glucose level is too high for a Fasting glucose, consistent with Diabetes.            Leonie Man, MD       ------

## 2014-08-07 NOTE — Telephone Encounter (Signed)
Returning your call. °

## 2014-08-07 NOTE — Telephone Encounter (Signed)
Message copied by Raiford Simmonds on Fri Aug 07, 2014  1:37 PM ------      Message from: Leonie Man      Created: Thu Aug 06, 2014  9:50 PM       Labs overall are Good -- HDL (good cholesterol is a bit low, but this corresponds to overall low cholesterol.  Glucose level is too high for a Fasting glucose, consistent with Diabetes.            Leonie Man, MD       ------

## 2014-08-08 NOTE — Assessment & Plan Note (Signed)
Again I talked about the importance of dietary modifications. He is doing plenty of exercise and he has lost some weight since his last visit. He is down from 279 pounds to 273 pounds.

## 2014-08-08 NOTE — Assessment & Plan Note (Signed)
He is on a combination of statin plus Zetia (Vytorin) along with fenofibrate. No myalgias noted.  Plan: Recheck chemistry panel along with a lipid panel. He is asked about possibly converting from Vytorin to a different medication possibly because of financial concerns with his insurance coverage.-

## 2014-08-08 NOTE — Assessment & Plan Note (Signed)
He only had one episode a few months ago and otherwise has not had any chest discomfort since his PCI. With the fact that he is active as he has been with his exercise regimen and did not notice chest discomfort with that, I inclined to think that's probably not cardiac in nature. He is on statin aspirin, Plavix, beta blocker and ARB.  If he has any bleeding issues he can stop the aspirin.

## 2014-08-08 NOTE — Assessment & Plan Note (Signed)
Tolerating CPAP 

## 2014-08-08 NOTE — Assessment & Plan Note (Signed)
Followed by PCP

## 2014-08-08 NOTE — Assessment & Plan Note (Signed)
Well controlled today on her medications.

## 2014-08-12 ENCOUNTER — Telehealth: Payer: Self-pay | Admitting: *Deleted

## 2014-08-12 MED ORDER — SIMVASTATIN 40 MG PO TABS: 40.0000 mg | ORAL_TABLET | Freq: Every day | ORAL | Status: DC

## 2014-08-12 NOTE — Telephone Encounter (Signed)
E sent [facility]---simvastatin

## 2014-08-17 ENCOUNTER — Other Ambulatory Visit: Payer: Self-pay | Admitting: *Deleted

## 2014-08-17 MED ORDER — CLOPIDOGREL BISULFATE 75 MG PO TABS: 75.0000 mg | ORAL_TABLET | Freq: Every day | ORAL | Status: DC

## 2014-08-17 NOTE — Telephone Encounter (Signed)
Rx was sent to pharmacy electronically. 

## 2014-08-20 ENCOUNTER — Telehealth: Payer: Self-pay | Admitting: Cardiology

## 2014-08-20 MED ORDER — CLOPIDOGREL BISULFATE 75 MG PO TABS: 75.0000 mg | ORAL_TABLET | Freq: Every day | ORAL | Status: DC

## 2014-08-20 NOTE — Telephone Encounter (Signed)
Pt is out of town,he need his medicine until he gets it at home. He need his Clopidogrel 75 mg #7.Please call to [phone].

## 2014-08-20 NOTE — Telephone Encounter (Signed)
Rx was sent to pharmacy electronically. 

## 2014-09-04 DIAGNOSIS — E118 Type 2 diabetes mellitus with unspecified complications: Secondary | ICD-10-CM | POA: Diagnosis not present

## 2014-09-04 DIAGNOSIS — E789 Disorder of lipoprotein metabolism, unspecified: Secondary | ICD-10-CM | POA: Diagnosis not present

## 2014-09-04 DIAGNOSIS — Z125 Encounter for screening for malignant neoplasm of prostate: Secondary | ICD-10-CM | POA: Diagnosis not present

## 2014-09-04 DIAGNOSIS — Z79899 Other long term (current) drug therapy: Secondary | ICD-10-CM | POA: Diagnosis not present

## 2014-09-21 DIAGNOSIS — L570 Actinic keratosis: Secondary | ICD-10-CM | POA: Diagnosis not present

## 2014-09-21 DIAGNOSIS — E789 Disorder of lipoprotein metabolism, unspecified: Secondary | ICD-10-CM | POA: Diagnosis not present

## 2014-09-21 DIAGNOSIS — I10 Essential (primary) hypertension: Secondary | ICD-10-CM | POA: Diagnosis not present

## 2014-09-21 DIAGNOSIS — E118 Type 2 diabetes mellitus with unspecified complications: Secondary | ICD-10-CM | POA: Diagnosis not present

## 2014-09-24 DIAGNOSIS — G4733 Obstructive sleep apnea (adult) (pediatric): Secondary | ICD-10-CM | POA: Diagnosis not present

## 2014-09-24 DIAGNOSIS — G4710 Hypersomnia, unspecified: Secondary | ICD-10-CM | POA: Diagnosis not present

## 2014-11-02 DIAGNOSIS — N529 Male erectile dysfunction, unspecified: Secondary | ICD-10-CM | POA: Diagnosis not present

## 2014-11-02 DIAGNOSIS — C61 Malignant neoplasm of prostate: Secondary | ICD-10-CM | POA: Diagnosis not present

## 2015-01-05 ENCOUNTER — Encounter: Payer: Self-pay | Admitting: *Deleted

## 2015-01-07 DIAGNOSIS — H4011X2 Primary open-angle glaucoma, moderate stage: Secondary | ICD-10-CM | POA: Diagnosis not present

## 2015-01-11 DIAGNOSIS — H4011X2 Primary open-angle glaucoma, moderate stage: Secondary | ICD-10-CM | POA: Diagnosis not present

## 2015-01-12 ENCOUNTER — Ambulatory Visit (INDEPENDENT_AMBULATORY_CARE_PROVIDER_SITE_OTHER): Payer: Medicare Other | Admitting: Cardiology

## 2015-01-12 ENCOUNTER — Encounter: Payer: Self-pay | Admitting: Cardiology

## 2015-01-12 VITALS — BP 142/74 | HR 75 | Ht 72.0 in | Wt 269.8 lb

## 2015-01-12 DIAGNOSIS — E669 Obesity, unspecified: Secondary | ICD-10-CM

## 2015-01-12 DIAGNOSIS — G4733 Obstructive sleep apnea (adult) (pediatric): Secondary | ICD-10-CM

## 2015-01-12 DIAGNOSIS — Z9861 Coronary angioplasty status: Secondary | ICD-10-CM | POA: Diagnosis not present

## 2015-01-12 DIAGNOSIS — I10 Essential (primary) hypertension: Secondary | ICD-10-CM | POA: Diagnosis not present

## 2015-01-12 DIAGNOSIS — E785 Hyperlipidemia, unspecified: Secondary | ICD-10-CM

## 2015-01-12 DIAGNOSIS — I2510 Atherosclerotic heart disease of native coronary artery without angina pectoris: Secondary | ICD-10-CM

## 2015-01-12 DIAGNOSIS — Z9989 Dependence on other enabling machines and devices: Secondary | ICD-10-CM

## 2015-01-12 NOTE — Patient Instructions (Signed)
NO CHANGE IN MEDICATIONS   PLEASE SEND COPY OF LABS FROM YOUR PRIMARY OFFICE.  Your physician wants you to follow-up in [place].  You will receive a reminder letter in the mail two months in advance. If you don't receive a letter, please call our office to schedule the follow-up appointment.

## 2015-01-13 DIAGNOSIS — E118 Type 2 diabetes mellitus with unspecified complications: Secondary | ICD-10-CM | POA: Diagnosis not present

## 2015-01-13 DIAGNOSIS — E789 Disorder of lipoprotein metabolism, unspecified: Secondary | ICD-10-CM | POA: Diagnosis not present

## 2015-01-14 ENCOUNTER — Encounter: Payer: Self-pay | Admitting: Cardiology

## 2015-01-14 NOTE — Progress Notes (Signed)
PCP: Dwan Bolt, MD  Clinic Note: Chief Complaint  Patient presents with  . 6 MONTH VISIT    CHEST DISCOMFORT EVERY ONCE IN WHILE, NO SOB , NO EDEMA BUT SWELLING AND PAIN IN LEFT KNEE  . Coronary Artery Disease   HPI: Renan Danese is a 69 y.o. male with a PMH below who presents today for a six-month followup of his CAD. As you recall he underwent right catheterization with PCI in in 2011 while he was still living in [place]. His presenting symptom was a discomfort sensation in his chest with palpitations. He had PCI to the PDA with an endeavor DES stent. Next low semisolid and 1 she he was doing quite well. No active symptoms..  Past Medical History  Diagnosis Date  . CAD S/P percutaneous coronary angioplasty 2011    PCI of PDA Endeavor 2.5 mm x 12 mm DES - [facility]  . Obesity, Class II, BMI 35-39.9, with comorbidity     BMI 37  . Hypertension, essential   . OSA on CPAP   . Diabetes mellitus, type II, insulin dependent     No longer on insulin according to his current med list   . Dyslipidemia, goal LDL below 70   . Gout   . Glaucoma     Prior Cardiac Evaluation and Past Surgical History: Past Surgical History  Procedure Laterality Date  . Prostatectomy  1995  . Coronary angioplasty with stent placement  2001    Endeavor 2.5 mm x 12 mm DES - distal PDA ([facility])  . Nm myoview ltd  June 2013    Treadmill Myoview: 10 minutes, 12 METS; no ischemia infarction, EF 65%   Interval History: Today, Antrone presents doing relatively well. He says he occasionally will have some twinging, pinching type of chest tightness that happens oftentimes in the morning or at night when he lies down. It is not necessarily associated with exertion. He has no real symptoms when he works out on the treadmill or the stationary bicycle or doing weights. He does this about 5 days a week. He continued muscle in his chest wall. Other than these periodic  symptoms, he seems relatively asymptomatic from a cardiac standpoint with no heart rate or symptoms of PND, orthopnea or edema. No palpitations. No dyspnea on exertion.  No lightheadedness, dizziness, weakness, syncope/near syncope, or TIA/amaurosis fugax symptoms.  ROS: A comprehensive was performed. Review of Systems  Constitutional: Negative for malaise/fatigue.  HENT: Negative for nosebleeds.   Respiratory: Negative for sputum production, shortness of breath and wheezing.   Cardiovascular: Negative for claudication.  Gastrointestinal: Negative for blood in stool and melena.  Genitourinary: Negative for hematuria.  Musculoskeletal: Positive for joint pain (leftkknee pain with swelling that goes down to the ankle).  Neurological: Negative.   Endo/Heme/Allergies: Does not bruise/bleed easily.  Psychiatric/Behavioral: Negative.   All other systems reviewed and are negative.   Current Outpatient Prescriptions on File Prior to Visit  Medication Sig Dispense Refill  . allopurinol (ZYLOPRIM) 300 MG tablet Take 300 mg by mouth daily.    Marland Kitchen aspirin EC 81 MG tablet Take 81 mg by mouth daily.    . AZOR 10-40 MG per tablet TAKE 1 TABLET BY MOUTH DAILY 90 tablet 2  . clopidogrel (PLAVIX) 75 MG tablet Take 1 tablet (75 mg total) by mouth daily. 7 tablet 0  . Cod Liver Oil w/Vit A & D CAPS Take 1 capsule by mouth daily.    Marland Kitchen  diphenhydrAMINE (BENADRYL) 25 mg capsule Take 25 mg by mouth every 6 (six) hours as needed for itching.    . dorzolamide-timolol (COSOPT) 22.3-6.8 MG/ML ophthalmic solution Place 1 drop into both eyes 2 (two) times daily.    . fenofibrate (TRICOR) 145 MG tablet Take 145 mg by mouth daily.    . furosemide (LASIX) 40 MG tablet Take a 1/2 to 1 tablet a day as needed.    . Insulin Glargine (LANTUS SOLOSTAR [place]) Inject 55 Units into the skin at bedtime.    . metFORMIN (GLUCOPHAGE) 500 MG tablet Take 500 mg by mouth 3 (three) times daily.    . Methylcellulose, Laxative, (CITRUCEL PO)  Take 1 tablet by mouth daily.    . naproxen sodium (ANAPROX) 220 MG tablet Take 220 mg by mouth 2 (two) times daily with a meal.    . simvastatin (ZOCOR) 40 MG tablet Take 1 tablet (40 mg total) by mouth daily. 90 tablet 3  . travoprost, benzalkonium, (TRAVATAN) 0.004 % ophthalmic solution Place 1 drop into both eyes at bedtime.     No current facility-administered medications on file prior to visit.   Allergies  Allergen Reactions  . Accupril [Quinapril Hcl] Swelling    Mouth swelling   History  Substance Use Topics  . Smoking status: Former Smoker    Types: Cigarettes    Quit date: 06/04/1993  . Smokeless tobacco: Not on file  . Alcohol Use: Yes    History reviewed. No pertinent family history. - he doesn't recall any premature CAD or diabetes in his mother or father.  His son just had a child who is now his second grandchild.  Wt Readings from Last 3 Encounters:  01/12/15 269 lb 12.8 oz (122.38 kg)  08/06/14 273 lb 4.8 oz (123.968 kg)  08/27/13 279 lb (126.554 kg)    PHYSICAL EXAM BP 142/74 mmHg  Pulse 75  Ht 6' (1.829 m)  Wt 269 lb 12.8 oz (122.38 kg)  BMI 36.58 kg/m2 General appearance: alert and oriented x3, cooperative, appears stated age, no distress, moderately obese and well-groomed, well-nourished. Pleasant mood and affect. Answers questions properly.  HEENT: [place]/AT, EOMI, MMM, anicteric sclera  Neck: no adenopathy, no JVD, supple, symmetrical, trachea midline and he may have a soft right carotid bruit versus just radiation of his systolic murmur.  Lungs: clear to auscultation bilaterally, normal percussion bilaterally and nonlabored with good air movement.  Heart: regular rate and rhythm, S1, S2 normal, no S3 or S4, systolic murmur: systolic ejection 1/6, crescendo and decrescendo at 2nd right intercostal space, radiates to carotids, no click and no rub  Abdomen: soft, non-tender; bowel sounds normal; no masses, no organomegaly and moderate obesity  Extremities: no  C/C/E; Pulses: 2+ and symmetric  Neurologic: Grossly normal   Adult ECG Report  Rate: 75 ;  Rhythm: normal sinus rhythm and nnormal axis, intervals and durations.  Narrative Interpretation: normal EKG  Recent Labs:  From October 2015 - reviewed in Epic  Lab Results  Component Value Date   CHOL 109 08/06/2014   HDL 32* 08/06/2014   LDLCALC 56 08/06/2014   TRIG 106 08/06/2014   CHOLHDL 3.4 08/06/2014     Chemistry      Component Value Date/Time   NA 140 08/06/2014 1041   K 4.0 08/06/2014 1041   CL 106 08/06/2014 1041   CO2 22 08/06/2014 1041   BUN 17 08/06/2014 1041   CREATININE 1.11 08/06/2014 1041      Component Value Date/Time  CALCIUM 9.8 08/06/2014 1041   ALKPHOS 73 08/06/2014 1041   AST 24 08/06/2014 1041   ALT 24 08/06/2014 1041   BILITOT 0.5 08/06/2014 1041      ASSESSMENT / PLAN: Overall relatively stable gentleman here for annual followup. No real active symptoms. Occasional mild tingling sensation in his chest is probably not an anginal equivalent as it is not associated with exertion. No medication changes needed. He apparently has had labs checked by his PCP recently. We are asked to Otherwise he is stable from cardiac standpoint and can followup on an annual basis.  CAD S/P percutaneous coronary angioplasty Intermittent but relatively infrequent episodes of chest discomfort as very fleeting in nature to it he had an episode this morning. These usually happen at rest and are not associated with exertion, making an ischemic etiology unlikely. Based on this OB reluctant to evaluate unless her symptoms get progressively worse. Excellent he is on a stable dose of statin as well as an ARB/amlodipine. He is not on a beta blocker however, likely because of prior presentation with sinus bradycardia.   Hypertension, essential Borderline control today. I am reluctant to increase any medications says he is a pretty good dose of most of his medications. His last couple  blood pressure readings were also borderline. He only takes when necessary Lasix, perhaps additional blood pressure control is needed we could consider chlorthalidone for mild diuretic effect as well as better blood pressure effect and HCTZ.   Dyslipidemia, goal LDL below 70 He is on fenofibrate along with simvastatin. No myalgias. Last lipids look pretty good from October 2015. Apparently he has had labs checked recently. I am a bit leery of him being on TriCor plus simvastatin. We may want to consider switching him over to a different statins because of increased risk of toxicity with this combination.   Obesity (BMI 30-39.9) Dietary modifications and continued exercise. Probably is unable to exercise whenever you or to burn off calories that he is eating. The patient understands the need to lose weight with diet and exercise. We have discussed specific strategies for this.    OSA on CPAP He is tolerating CPAP better. Seems to be resting better.     Orders Placed This Encounter  Procedures  . EKG 12-Lead   Meds ordered this encounter  Medications  . DISCONTD: metoprolol (LOPRESSOR) 100 MG tablet    Sig: Take 100 mg by mouth 2 (two) times daily.    Followup: one year   Kyona Chauncey, Leonie Green, M.D., M.S. Interventional Cardiologist   Pager # [phone]

## 2015-01-14 NOTE — Assessment & Plan Note (Signed)
Borderline control today. I am reluctant to increase any medications says he is a pretty good dose of most of his medications. His last couple blood pressure readings were also borderline. He only takes when necessary Lasix, perhaps additional blood pressure control is needed we could consider chlorthalidone for mild diuretic effect as well as better blood pressure effect and HCTZ.

## 2015-01-14 NOTE — Assessment & Plan Note (Signed)
He is on fenofibrate along with simvastatin. No myalgias. Last lipids look pretty good from October 2015. Apparently he has had labs checked recently. I am a bit leery of him being on TriCor plus simvastatin. We may want to consider switching him over to a different statins because of increased risk of toxicity with this combination.

## 2015-01-14 NOTE — Assessment & Plan Note (Signed)
Dietary modifications and continued exercise. Probably is unable to exercise whenever you or to burn off calories that he is eating. The patient understands the need to lose weight with diet and exercise. We have discussed specific strategies for this.

## 2015-01-14 NOTE — Assessment & Plan Note (Signed)
He is tolerating CPAP better. Seems to be resting better.

## 2015-01-14 NOTE — Assessment & Plan Note (Signed)
Intermittent but relatively infrequent episodes of chest discomfort as very fleeting in nature to it he had an episode this morning. These usually happen at rest and are not associated with exertion, making an ischemic etiology unlikely. Based on this OB reluctant to evaluate unless her symptoms get progressively worse. Excellent he is on a stable dose of statin as well as an ARB/amlodipine. He is not on a beta blocker however, likely because of prior presentation with sinus bradycardia.

## 2015-01-20 DIAGNOSIS — E118 Type 2 diabetes mellitus with unspecified complications: Secondary | ICD-10-CM | POA: Diagnosis not present

## 2015-01-20 DIAGNOSIS — C61 Malignant neoplasm of prostate: Secondary | ICD-10-CM | POA: Diagnosis not present

## 2015-01-20 DIAGNOSIS — M1712 Unilateral primary osteoarthritis, left knee: Secondary | ICD-10-CM | POA: Diagnosis not present

## 2015-01-20 DIAGNOSIS — E790 Hyperuricemia without signs of inflammatory arthritis and tophaceous disease: Secondary | ICD-10-CM | POA: Diagnosis not present

## 2015-01-20 DIAGNOSIS — M25569 Pain in unspecified knee: Secondary | ICD-10-CM | POA: Diagnosis not present

## 2015-01-20 DIAGNOSIS — M109 Gout, unspecified: Secondary | ICD-10-CM | POA: Diagnosis not present

## 2015-02-03 ENCOUNTER — Telehealth: Payer: Self-pay | Admitting: *Deleted

## 2015-02-03 DIAGNOSIS — E785 Hyperlipidemia, unspecified: Secondary | ICD-10-CM

## 2015-02-03 DIAGNOSIS — Z79899 Other long term (current) drug therapy: Secondary | ICD-10-CM

## 2015-02-03 NOTE — Telephone Encounter (Signed)
Mailed and lab slip liver ,lipid

## 2015-02-03 NOTE — Telephone Encounter (Signed)
-----   Message from Raiford Simmonds, RN sent at 08/07/2014  3:26 PM EDT ----- DUE IN April 2016- LIPID , LIVER  MAIL  IN MARCH 2016

## 2015-02-21 ENCOUNTER — Other Ambulatory Visit: Payer: Self-pay | Admitting: Cardiology

## 2015-02-22 NOTE — Telephone Encounter (Signed)
Rx refill sent to patient pharmacy   

## 2015-03-08 DIAGNOSIS — H409 Unspecified glaucoma: Secondary | ICD-10-CM | POA: Diagnosis not present

## 2015-03-08 DIAGNOSIS — Z955 Presence of coronary angioplasty implant and graft: Secondary | ICD-10-CM | POA: Diagnosis not present

## 2015-03-08 DIAGNOSIS — E119 Type 2 diabetes mellitus without complications: Secondary | ICD-10-CM | POA: Diagnosis not present

## 2015-03-08 DIAGNOSIS — I10 Essential (primary) hypertension: Secondary | ICD-10-CM | POA: Diagnosis not present

## 2015-03-08 DIAGNOSIS — M5134 Other intervertebral disc degeneration, thoracic region: Secondary | ICD-10-CM | POA: Diagnosis not present

## 2015-03-08 DIAGNOSIS — R42 Dizziness and giddiness: Secondary | ICD-10-CM | POA: Diagnosis not present

## 2015-03-08 DIAGNOSIS — M109 Gout, unspecified: Secondary | ICD-10-CM | POA: Diagnosis not present

## 2015-03-25 DIAGNOSIS — R202 Paresthesia of skin: Secondary | ICD-10-CM | POA: Diagnosis not present

## 2015-03-25 DIAGNOSIS — M7989 Other specified soft tissue disorders: Secondary | ICD-10-CM | POA: Diagnosis not present

## 2015-03-25 DIAGNOSIS — I10 Essential (primary) hypertension: Secondary | ICD-10-CM | POA: Diagnosis not present

## 2015-03-25 DIAGNOSIS — M79604 Pain in right leg: Secondary | ICD-10-CM | POA: Diagnosis not present

## 2015-03-25 DIAGNOSIS — Z955 Presence of coronary angioplasty implant and graft: Secondary | ICD-10-CM | POA: Diagnosis not present

## 2015-03-25 DIAGNOSIS — Z0389 Encounter for observation for other suspected diseases and conditions ruled out: Secondary | ICD-10-CM | POA: Diagnosis not present

## 2015-03-25 DIAGNOSIS — M47812 Spondylosis without myelopathy or radiculopathy, cervical region: Secondary | ICD-10-CM | POA: Diagnosis not present

## 2015-03-25 DIAGNOSIS — E119 Type 2 diabetes mellitus without complications: Secondary | ICD-10-CM | POA: Diagnosis not present

## 2015-03-25 DIAGNOSIS — R42 Dizziness and giddiness: Secondary | ICD-10-CM | POA: Diagnosis not present

## 2015-03-25 DIAGNOSIS — I2510 Atherosclerotic heart disease of native coronary artery without angina pectoris: Secondary | ICD-10-CM | POA: Diagnosis not present

## 2015-03-25 DIAGNOSIS — R531 Weakness: Secondary | ICD-10-CM | POA: Diagnosis not present

## 2015-03-25 DIAGNOSIS — Z87891 Personal history of nicotine dependence: Secondary | ICD-10-CM | POA: Diagnosis not present

## 2015-03-25 DIAGNOSIS — G4730 Sleep apnea, unspecified: Secondary | ICD-10-CM | POA: Diagnosis not present

## 2015-03-25 DIAGNOSIS — M109 Gout, unspecified: Secondary | ICD-10-CM | POA: Diagnosis not present

## 2015-03-25 DIAGNOSIS — H538 Other visual disturbances: Secondary | ICD-10-CM | POA: Diagnosis not present

## 2015-03-25 DIAGNOSIS — E669 Obesity, unspecified: Secondary | ICD-10-CM | POA: Diagnosis not present

## 2015-03-25 DIAGNOSIS — E785 Hyperlipidemia, unspecified: Secondary | ICD-10-CM | POA: Diagnosis not present

## 2015-03-25 DIAGNOSIS — M79605 Pain in left leg: Secondary | ICD-10-CM | POA: Diagnosis not present

## 2015-03-25 DIAGNOSIS — H409 Unspecified glaucoma: Secondary | ICD-10-CM | POA: Diagnosis not present

## 2015-03-26 DIAGNOSIS — E119 Type 2 diabetes mellitus without complications: Secondary | ICD-10-CM | POA: Diagnosis not present

## 2015-03-26 DIAGNOSIS — R200 Anesthesia of skin: Secondary | ICD-10-CM | POA: Diagnosis not present

## 2015-03-26 DIAGNOSIS — R202 Paresthesia of skin: Secondary | ICD-10-CM | POA: Diagnosis not present

## 2015-03-26 DIAGNOSIS — I2510 Atherosclerotic heart disease of native coronary artery without angina pectoris: Secondary | ICD-10-CM | POA: Diagnosis not present

## 2015-03-26 DIAGNOSIS — M7989 Other specified soft tissue disorders: Secondary | ICD-10-CM | POA: Diagnosis not present

## 2015-03-26 DIAGNOSIS — R51 Headache: Secondary | ICD-10-CM | POA: Diagnosis not present

## 2015-03-26 DIAGNOSIS — I67848 Other cerebrovascular vasospasm and vasoconstriction: Secondary | ICD-10-CM | POA: Diagnosis not present

## 2015-03-27 DIAGNOSIS — R202 Paresthesia of skin: Secondary | ICD-10-CM | POA: Diagnosis not present

## 2015-03-27 DIAGNOSIS — I2510 Atherosclerotic heart disease of native coronary artery without angina pectoris: Secondary | ICD-10-CM | POA: Diagnosis not present

## 2015-03-27 DIAGNOSIS — E119 Type 2 diabetes mellitus without complications: Secondary | ICD-10-CM | POA: Diagnosis not present

## 2015-03-28 DIAGNOSIS — Z9889 Other specified postprocedural states: Secondary | ICD-10-CM | POA: Diagnosis not present

## 2015-03-28 DIAGNOSIS — I25810 Atherosclerosis of coronary artery bypass graft(s) without angina pectoris: Secondary | ICD-10-CM | POA: Diagnosis not present

## 2015-03-28 DIAGNOSIS — R42 Dizziness and giddiness: Secondary | ICD-10-CM | POA: Diagnosis not present

## 2015-03-28 DIAGNOSIS — M109 Gout, unspecified: Secondary | ICD-10-CM | POA: Diagnosis not present

## 2015-03-28 DIAGNOSIS — R202 Paresthesia of skin: Secondary | ICD-10-CM | POA: Diagnosis not present

## 2015-03-28 DIAGNOSIS — Z794 Long term (current) use of insulin: Secondary | ICD-10-CM | POA: Diagnosis not present

## 2015-03-28 DIAGNOSIS — H409 Unspecified glaucoma: Secondary | ICD-10-CM | POA: Diagnosis not present

## 2015-03-28 DIAGNOSIS — G459 Transient cerebral ischemic attack, unspecified: Secondary | ICD-10-CM | POA: Diagnosis not present

## 2015-03-28 DIAGNOSIS — I10 Essential (primary) hypertension: Secondary | ICD-10-CM | POA: Diagnosis not present

## 2015-03-28 DIAGNOSIS — Z79899 Other long term (current) drug therapy: Secondary | ICD-10-CM | POA: Diagnosis not present

## 2015-03-28 DIAGNOSIS — Z888 Allergy status to other drugs, medicaments and biological substances status: Secondary | ICD-10-CM | POA: Diagnosis not present

## 2015-03-28 DIAGNOSIS — E1165 Type 2 diabetes mellitus with hyperglycemia: Secondary | ICD-10-CM | POA: Diagnosis not present

## 2015-03-28 DIAGNOSIS — E785 Hyperlipidemia, unspecified: Secondary | ICD-10-CM | POA: Diagnosis not present

## 2015-03-28 DIAGNOSIS — Z955 Presence of coronary angioplasty implant and graft: Secondary | ICD-10-CM | POA: Diagnosis not present

## 2015-03-28 DIAGNOSIS — G4733 Obstructive sleep apnea (adult) (pediatric): Secondary | ICD-10-CM | POA: Diagnosis not present

## 2015-03-29 DIAGNOSIS — E119 Type 2 diabetes mellitus without complications: Secondary | ICD-10-CM | POA: Diagnosis not present

## 2015-03-29 DIAGNOSIS — I10 Essential (primary) hypertension: Secondary | ICD-10-CM | POA: Diagnosis not present

## 2015-03-29 DIAGNOSIS — I2510 Atherosclerotic heart disease of native coronary artery without angina pectoris: Secondary | ICD-10-CM | POA: Diagnosis not present

## 2015-03-29 DIAGNOSIS — G4733 Obstructive sleep apnea (adult) (pediatric): Secondary | ICD-10-CM | POA: Diagnosis not present

## 2015-03-29 DIAGNOSIS — R200 Anesthesia of skin: Secondary | ICD-10-CM | POA: Diagnosis not present

## 2015-03-29 DIAGNOSIS — R202 Paresthesia of skin: Secondary | ICD-10-CM | POA: Diagnosis not present

## 2015-03-29 DIAGNOSIS — R42 Dizziness and giddiness: Secondary | ICD-10-CM | POA: Diagnosis not present

## 2015-03-29 DIAGNOSIS — M542 Cervicalgia: Secondary | ICD-10-CM | POA: Diagnosis not present

## 2015-03-30 DIAGNOSIS — R42 Dizziness and giddiness: Secondary | ICD-10-CM | POA: Diagnosis not present

## 2015-03-30 DIAGNOSIS — R200 Anesthesia of skin: Secondary | ICD-10-CM | POA: Diagnosis not present

## 2015-03-30 DIAGNOSIS — I2510 Atherosclerotic heart disease of native coronary artery without angina pectoris: Secondary | ICD-10-CM | POA: Diagnosis not present

## 2015-03-30 DIAGNOSIS — G4733 Obstructive sleep apnea (adult) (pediatric): Secondary | ICD-10-CM | POA: Diagnosis not present

## 2015-03-30 DIAGNOSIS — R202 Paresthesia of skin: Secondary | ICD-10-CM | POA: Diagnosis not present

## 2015-03-30 DIAGNOSIS — E119 Type 2 diabetes mellitus without complications: Secondary | ICD-10-CM | POA: Diagnosis not present

## 2015-03-30 DIAGNOSIS — M542 Cervicalgia: Secondary | ICD-10-CM | POA: Diagnosis not present

## 2015-03-30 DIAGNOSIS — I10 Essential (primary) hypertension: Secondary | ICD-10-CM | POA: Diagnosis not present

## 2015-04-07 DIAGNOSIS — E789 Disorder of lipoprotein metabolism, unspecified: Secondary | ICD-10-CM | POA: Diagnosis not present

## 2015-04-07 DIAGNOSIS — I10 Essential (primary) hypertension: Secondary | ICD-10-CM | POA: Diagnosis not present

## 2015-04-07 DIAGNOSIS — I739 Peripheral vascular disease, unspecified: Secondary | ICD-10-CM | POA: Diagnosis not present

## 2015-04-07 DIAGNOSIS — E118 Type 2 diabetes mellitus with unspecified complications: Secondary | ICD-10-CM | POA: Diagnosis not present

## 2015-04-07 DIAGNOSIS — E139 Other specified diabetes mellitus without complications: Secondary | ICD-10-CM | POA: Diagnosis not present

## 2015-04-07 DIAGNOSIS — I2510 Atherosclerotic heart disease of native coronary artery without angina pectoris: Secondary | ICD-10-CM | POA: Diagnosis not present

## 2015-04-09 DIAGNOSIS — I10 Essential (primary) hypertension: Secondary | ICD-10-CM | POA: Diagnosis not present

## 2015-04-09 DIAGNOSIS — A070 Balantidiasis: Secondary | ICD-10-CM | POA: Diagnosis not present

## 2015-04-09 DIAGNOSIS — L255 Unspecified contact dermatitis due to plants, except food: Secondary | ICD-10-CM | POA: Diagnosis not present

## 2015-04-13 DIAGNOSIS — A070 Balantidiasis: Secondary | ICD-10-CM | POA: Diagnosis not present

## 2015-04-28 ENCOUNTER — Encounter (INDEPENDENT_AMBULATORY_CARE_PROVIDER_SITE_OTHER): Payer: Self-pay | Admitting: Neurology

## 2015-04-28 ENCOUNTER — Ambulatory Visit (INDEPENDENT_AMBULATORY_CARE_PROVIDER_SITE_OTHER): Payer: Medicare Other | Admitting: Neurology

## 2015-04-28 DIAGNOSIS — G5601 Carpal tunnel syndrome, right upper limb: Secondary | ICD-10-CM

## 2015-04-28 DIAGNOSIS — M5412 Radiculopathy, cervical region: Secondary | ICD-10-CM

## 2015-04-28 DIAGNOSIS — Z0289 Encounter for other administrative examinations: Secondary | ICD-10-CM

## 2015-04-28 DIAGNOSIS — R200 Anesthesia of skin: Secondary | ICD-10-CM

## 2015-04-28 DIAGNOSIS — R202 Paresthesia of skin: Secondary | ICD-10-CM

## 2015-04-28 NOTE — Progress Notes (Signed)
  Referring physician:  Anda Kraft, MD Performing physician: Nanine Means. Felecia Shelling, MD. PhD Date 04/28/2015  Patient: Jacob Berger (DOB September 10, 1946)   HISTORY:  Mr. Barren is a 70 year old man who reports 5 episodes of numbness in the left arm. The episodes were similar with pain that started in the hand and quickly worked itself up the arm towards the neck. There was not pain. With at least one episode he had milder symptoms in the right leg. There is no weakness but he felt clumsier. The spells would last 10 minutes. As the numbness improved he would begin to get a headache, on the left side of his head.   Of note, the numbness involved all fingers and both the palm and the dorsum of the hand.  Currently, he denies any numbness or weakness in the right arm.  He does note some lower neck pain, more on the right.  NERVE CONDUCTION STUDIES:  The right median motor response had a mildly increased distal latency with a mildly reduced amplitude. Conduction velocity in the forearm was normal. The median F-wave response was normal. The right median sensory response was slowed across the wrist. The right ulnar motor and sensory responses were normal. The ulnar F-wave response was normal. The right radial sensory response was normal.  EMG STUDIES:  Needle EMG of select muscles of the right arm was performed Deltoid: Motor unit morphology and recruitment were normal. There was no spontaneous activity. Triceps: Polyphasic motor units were observed with neuropathic recruitment. There was no spontaneous activity. Biceps: Motor unit morphology and recruitment were normal. There was no spontaneous activity. EDC:  Polyphasic motor units were observed with neuropathic recruitment. There was no spontaneous activity. FDIO:  Motor unit morphology and recruitment were normal. There was no spontaneous activity. APB:  Motor unit morphology and recruitment were normal. There was no spontaneous  activity.  IMPRESSION:  This is an abnormal NCV/EMG of the right arm showing the following: 1.  Mild chronic right C7 radiculopathy 2.  Moderate right median neuropathy at the wrist  (carpal tunnel syndrome.  A median innervated hand muscle was normal.   Elvera Almario A. Felecia Shelling, MD, PhD Certified in Neurology, Clinical Neurophysiology, Sleep Medicine, Pain Medicine and Neuroimaging  [facility] [address] [place], [place] [address] [phone]  Addendum: Clinically, his episodes might represent atypical migraine headaches with sensory aura followed by contralateral headache pain. --  RAS

## 2015-04-29 ENCOUNTER — Encounter: Payer: Self-pay | Admitting: Neurology

## 2015-04-29 ENCOUNTER — Ambulatory Visit (INDEPENDENT_AMBULATORY_CARE_PROVIDER_SITE_OTHER): Payer: Medicare Other | Admitting: Neurology

## 2015-04-29 VITALS — BP 128/64 | HR 66 | Resp 14 | Ht 72.0 in | Wt 262.6 lb

## 2015-04-29 DIAGNOSIS — M5412 Radiculopathy, cervical region: Secondary | ICD-10-CM

## 2015-04-29 DIAGNOSIS — G4489 Other headache syndrome: Secondary | ICD-10-CM | POA: Diagnosis not present

## 2015-04-29 DIAGNOSIS — G5601 Carpal tunnel syndrome, right upper limb: Secondary | ICD-10-CM

## 2015-04-29 DIAGNOSIS — R202 Paresthesia of skin: Secondary | ICD-10-CM

## 2015-04-29 DIAGNOSIS — I2510 Atherosclerotic heart disease of native coronary artery without angina pectoris: Secondary | ICD-10-CM

## 2015-04-29 DIAGNOSIS — Z9861 Coronary angioplasty status: Secondary | ICD-10-CM

## 2015-04-29 DIAGNOSIS — R200 Anesthesia of skin: Secondary | ICD-10-CM

## 2015-04-29 NOTE — Progress Notes (Addendum)
[facility]  PATIENT: Jacob Berger DOB: 1946/01/17  REFERRING DOCTOR OR PCP:  Anda Kraft SOURCE: patient and records form Dr. Wilson Singer and recent NCV /EMG  _________________________________   HISTORICAL  CHIEF COMPLAINT:  Chief Complaint  Patient presents with  . Carpal Tunnel    Numbness right hand radiating up to shoulder onset about 2 mos. ago.  EMG/NCV was done here at [facility] yesterday and he is here today to review results./fim    HISTORY OF PRESENT ILLNESS:  I had the pleasure of seeing your patient Jacob Berger, at [facility] for a consultation regarding his spells of right sided neurologic symptoms.      He reports about 5-6 episodes of right arm numbness lasting 10-30 minutes followed by left sided headache.   The first (end of April, 2016) and third time this occurred, he went to the hospital ([facility] in [place], [place]) and had a CT scan and doppler studies for concern of TIA/stroke.    The third episode, he also had mild right leg weakness and clumsiness for a few minutes.    CT scan Mar 25, 2015 showed mild small vessel ischemic disease.    MRI 03/26/15 confirmed the CT finding and showed no acute CVA.   He was in 24 hour ED monitoring.   The dopplers showed normal left and right internal carotid arteries.    Cervical spine x-rays showed degenerative changes.   HgbA1C was elevated at 9.1(has DM, on insulin).     He did note lightheadedness with some of the spells and noted a left sided headache with some of the spells that followed the neurologic symptoms.      He had a NCV/EMG here showing moderate right carpal tunnel syndrome and mild chronic right C7 radiculopathy but no acute findings.      He denies any current issues with his bladder (did better after CPAP).   No current weakness or numbness.     Sometimes he gets tingling in the right palm.fingers.    He shakes his hand and it feels better within a minute.     He has  OSA and is on CPAP  REVIEW OF SYSTEMS: Constitutional: No fevers, chills, sweats, or change in appetite Eyes: No visual changes, double vision, eye pain Ear, nose and throat: No hearing loss, ear pain, nasal congestion, sore throat Cardiovascular: No chest pain, palpitations Respiratory: No shortness of breath at rest or with exertion.   No wheezes.   Has OSA on CPAP GastrointestinaI: No nausea, vomiting, diarrhea, abdominal pain, fecal incontinence Genitourinary: No dysuria, urinary retention or frequency.  No nocturia. Musculoskeletal: No neck pain, back pain Integumentary: No rash, pruritus, skin lesions Neurological: as above Psychiatric: No depression at this time.  No anxiety Endocrine: No palpitations, diaphoresis, change in appetite, change in weigh or increased thirst Hematologic/Lymphatic: No anemia, purpura, petechiae. Allergic/Immunologic: No itchy/runny eyes, nasal congestion, recent allergic reactions, rashes  ALLERGIES: Allergies  Allergen Reactions  . Accupril [Quinapril Hcl] Swelling    Mouth swelling    HOME MEDICATIONS:  Current outpatient prescriptions:  .  allopurinol (ZYLOPRIM) 300 MG tablet, Take 300 mg by mouth daily., Disp: , Rfl:  .  aspirin EC 81 MG tablet, Take 81 mg by mouth daily., Disp: , Rfl:  .  AZOR 10-40 MG per tablet, TAKE 1 TABLET BY MOUTH DAILY, Disp: 90 tablet, Rfl: 2 .  B-D ULTRAFINE III SHORT PEN 31G X 8 MM MISC, , Disp: , Rfl:  .  betamethasone valerate (VALISONE) 0.1 % cream, , Disp: , Rfl:  .  BRILINTA 60 MG TABS tablet, , Disp: , Rfl:  .  Cod Liver Oil w/Vit A & D CAPS, Take 1 capsule by mouth daily., Disp: , Rfl:  .  diphenhydrAMINE (BENADRYL) 25 mg capsule, Take 25 mg by mouth every 6 (six) hours as needed for itching., Disp: , Rfl:  .  dorzolamide-timolol (COSOPT) 22.3-6.8 MG/ML ophthalmic solution, Place 1 drop into both eyes 2 (two) times daily., Disp: , Rfl:  .  fenofibrate (TRICOR) 145 MG tablet, Take 145 mg by mouth daily.,  Disp: , Rfl:  .  furosemide (LASIX) 40 MG tablet, Take a 1/2 to 1 tablet a day as needed., Disp: , Rfl:  .  Insulin Glargine (LANTUS SOLOSTAR [place]), Inject 55 Units into the skin at bedtime., Disp: , Rfl:  .  metFORMIN (GLUCOPHAGE) 500 MG tablet, Take 500 mg by mouth 3 (three) times daily., Disp: , Rfl:  .  Methylcellulose, Laxative, (CITRUCEL PO), Take 1 tablet by mouth daily., Disp: , Rfl:  .  metoprolol succinate (TOPROL-XL) 100 MG 24 hr tablet, , Disp: , Rfl:  .  naproxen sodium (ANAPROX) 220 MG tablet, Take 220 mg by mouth 2 (two) times daily with a meal., Disp: , Rfl:  .  simvastatin (ZOCOR) 40 MG tablet, Take 1 tablet (40 mg total) by mouth daily., Disp: 90 tablet, Rfl: 3 .  terbinafine (LAMISIL) 250 MG tablet, , Disp: , Rfl:  .  travoprost, benzalkonium, (TRAVATAN) 0.004 % ophthalmic solution, Place 1 drop into both eyes at bedtime., Disp: , Rfl:  .  VYTORIN 10-40 MG per tablet, , Disp: , Rfl:  .  triamcinolone cream (KENALOG) 0.1 %, , Disp: , Rfl:   PAST MEDICAL HISTORY: Past Medical History  Diagnosis Date  . CAD S/P percutaneous coronary angioplasty 2011    PCI of PDA Endeavor 2.5 mm x 12 mm DES - [facility]  . Obesity, Class II, BMI 35-39.9, with comorbidity     BMI 37  . Hypertension, essential   . OSA on CPAP   . Diabetes mellitus, type II, insulin dependent     No longer on insulin according to his current med list   . Dyslipidemia, goal LDL below 70   . Gout   . Glaucoma     PAST SURGICAL HISTORY: Past Surgical History  Procedure Laterality Date  . Prostatectomy  1995  . Coronary angioplasty with stent placement  2001    Endeavor 2.5 mm x 12 mm DES - distal PDA ([facility])  . Nm myoview ltd  June 2013    Treadmill Myoview: 10 minutes, 12 METS; no ischemia infarction, EF 65%    FAMILY HISTORY: No family history on file.  SOCIAL HISTORY:  History   Social History  . Marital Status: Unknown    Spouse Name: N/A  . Number of  Children: N/A  . Years of Education: N/A   Occupational History  . Not on file.   Social History Main Topics  . Smoking status: Former Smoker    Types: Cigarettes    Quit date: 06/04/1993  . Smokeless tobacco: Not on file  . Alcohol Use: Yes  . Drug Use: No  . Sexual Activity: Not on file   Other Topics Concern  . Not on file   Social History Narrative   Married father of 3. Had been walking up to 2 miles every other day appetite is good  his weight for about half an hour time period, but limited now due to back pain. Occasional alcohol. No tobacco products -- he quit about 20-30 years ago.     PHYSICAL EXAM  Filed Vitals:   04/29/15 0956  BP: 128/64  Pulse: 66  Resp: 14  Height: 6' (1.829 m)  Weight: 262 lb 9.6 oz (119.115 kg)    Body mass index is 35.61 kg/(m^2).   General: The patient is well-developed and well-nourished and in no acute distress  Eyes:  Funduscopic exam shows normal optic discs and retinal vessels.  Neck: The neck is supple, no carotid bruits are noted.  The neck is slightly tender around C7-T2 .  Cardiovascular: The heart has a regular rate and rhythm with a normal S1 and S2. There were no murmurs, gallops or rubs. Lungs are clear to auscultation.  Skin: Extremities are without significant edema.  Musculoskeletal:  Back is nontender  Neurologic Exam  Mental status: The patient is alert and oriented x 3 at the time of the examination. The patient has apparent normal recent and remote memory, with an apparently normal attention span and concentration ability.   Speech is normal.  Cranial nerves: Extraocular movements are full.   Facial symmetry is present. There is good facial sensation to soft touch bilaterally.Facial strength is normal.  Trapezius and sternocleidomastoid strength is normal. No dysarthria is noted.  The tongue is midline, and the patient has symmetric elevation of the soft palate. No obvious hearing deficits are noted.  Motor:   Muscle bulk is normal.   Tone is normal. Strength is  5 / 5 in all 4 extremities except 4+/5 right APB muscle (median innervated)  Sensory: Sensory testing is intact to pinprick, soft touch and vibration sensation in all 4 extremities.  Coordination: Cerebellar testing reveals good finger-nose-finger    Gait and station: Station is normal.   Gait is normal. Tandem gait is normal. Romberg is negative.   Reflexes: Deep tendon reflexes are symmetric and normal bilaterally.   Plantar responses are flexor.    DIAGNOSTIC DATA (LABS, IMAGING, TESTING) - I reviewed patient records, labs, notes, testing and imaging myself where available.  No results found for: WBC, HGB, HCT, MCV, PLT    Component Value Date/Time   NA 140 08/06/2014 1041   K 4.0 08/06/2014 1041   CL 106 08/06/2014 1041   CO2 22 08/06/2014 1041   GLUCOSE 156* 08/06/2014 1041   BUN 17 08/06/2014 1041   CREATININE 1.11 08/06/2014 1041   CALCIUM 9.8 08/06/2014 1041   PROT 7.2 08/06/2014 1041   ALBUMIN 4.5 08/06/2014 1041   AST 24 08/06/2014 1041   ALT 24 08/06/2014 1041   ALKPHOS 73 08/06/2014 1041   BILITOT 0.5 08/06/2014 1041   Lab Results  Component Value Date   CHOL 109 08/06/2014   HDL 32* 08/06/2014   LDLCALC 56 08/06/2014   TRIG 106 08/06/2014   CHOLHDL 3.4 08/06/2014     ASSESSMENT AND PLAN  Right cervical radiculopathy  Right carpal tunnel syndrome  Right arm numbness  Other headache syndrome   In summary, Mr. Tooker is a 69 year old man who has had multiple episodes of right arm numbness, some associated with some clumsiness in the arm and right leg and some followed by headache on the left. If his symptoms had been in the right arm, then his mild chronic C7 radiculopathy and carpal tunnel syndrome could explain his symptoms. However, because the leg was involved in one of the  episodes, we need to also consider a CNS process. He reportedly had no evidence of stroke on the MRI but I have requested the  films for personal review. Multiple TIAs is uncommon and his carotid Doppler was normal.  Another alternative could be complicated migraines, especially since some of the episodes were followed by headache on the left. These often respond to calcium channel blockers but since he is on high dose metoprolol the combination could lead to lightheadedness and bradycardia.  It is uncertain whether he had an MRI of the cervical spine in [place] or not and I have requested all MRIs for review. If his symptoms persist and he did not have an MRI, we may need to obtain one to better characterize the radiculopathy. Also, if symptoms persist we may want to try adding vrapamil to see if the episodes improve but hold if he gets side effects.  He will return to see me in 2 months or sooner if there are new or worsening neurologic symptoms.    Shirline Kendle A. Felecia Shelling, MD, PhD 2/67/1245, 80:99 AM Certified in Neurology, Clinical Neurophysiology, Sleep Medicine, Pain Medicine and Neuroimaging  [facility] [address] [place], [place] [address] [phone]   Addendum: Additional information from [facility] became available. Besides the studies reviewed above, he also had an MR angiogram that was normal. An MRA of the neck was normal. MRI of the cervical spine showed multilevel degenerative changes with moderate left greater than right neural foraminal narrowing at C4-C5, moderate left greater than right foraminal narrowing at C5-C6.

## 2015-04-29 NOTE — Patient Instructions (Signed)
Jacob Berger has had several episodes of right arm numbness, some accompanied by right leg numbness and clumsiness the etiology is not completely clear. MRI of the brain reportedly just showed mild age related findings and nothing acute. Carotid Doppler studies showed normal arteries. Nerve conduction studies shows mild chronic right C7 radiculopathy and moderate right carpal tunnel syndrome, though this cannot explain all of his symptoms.   The episodes could represent complicated migraines as most of them were followed by a left-sided headache.

## 2015-05-12 ENCOUNTER — Telehealth: Payer: Self-pay | Admitting: *Deleted

## 2015-05-12 NOTE — Telephone Encounter (Signed)
LMOM that he has medical records ready to be picked up at the [facility] front desk.  (these are records that he dropped off for Dr. Felecia Shelling to look at, and he wanted them back.)/fim

## 2015-05-17 ENCOUNTER — Encounter: Payer: Self-pay | Admitting: *Deleted

## 2015-05-17 NOTE — Telephone Encounter (Signed)
LMOM that med. records are ready to be picked up.  I am not sure he is getting my messages, so I have also mailed him a letter today letting him know Dr. Felecia Shelling is finished reviewing med. records and he can pick them up/fim

## 2015-06-23 ENCOUNTER — Ambulatory Visit (INDEPENDENT_AMBULATORY_CARE_PROVIDER_SITE_OTHER): Payer: Medicare Other | Admitting: Neurology

## 2015-06-23 ENCOUNTER — Encounter: Payer: Self-pay | Admitting: Neurology

## 2015-06-23 VITALS — BP 142/70 | HR 76 | Resp 16 | Ht 72.0 in | Wt 267.6 lb

## 2015-06-23 DIAGNOSIS — R202 Paresthesia of skin: Secondary | ICD-10-CM | POA: Diagnosis not present

## 2015-06-23 DIAGNOSIS — Z9861 Coronary angioplasty status: Secondary | ICD-10-CM

## 2015-06-23 DIAGNOSIS — G5601 Carpal tunnel syndrome, right upper limb: Secondary | ICD-10-CM

## 2015-06-23 DIAGNOSIS — M5412 Radiculopathy, cervical region: Secondary | ICD-10-CM

## 2015-06-23 DIAGNOSIS — R200 Anesthesia of skin: Secondary | ICD-10-CM

## 2015-06-23 DIAGNOSIS — G4489 Other headache syndrome: Secondary | ICD-10-CM

## 2015-06-23 DIAGNOSIS — I2510 Atherosclerotic heart disease of native coronary artery without angina pectoris: Secondary | ICD-10-CM

## 2015-06-23 NOTE — Progress Notes (Signed)
[facility]  PATIENT: Jacob Berger DOB: February 04, 1946  REFERRING DOCTOR OR PCP:  Anda Kraft SOURCE: patient and records form Dr. Wilson Singer and recent NCV /EMG  _________________________________   HISTORICAL  CHIEF COMPLAINT:  Chief Complaint  Patient presents with  . Right arm numbness    Sts. right arm numbness/clumsiness is much improved.  He still occasionally wakes during the night with tingling in right 4th, 5th an sometimes right 3rd fingers./fim    HISTORY OF PRESENT ILLNESS:  Jacob Berger is a 69 year old man who was experiencing episodes of numbness and tingling in the right arm.      He had 5-6 episodes of right arm numbness lasting 10-30 minutes in April and May but has not had any more episodes.     He has had a few more episodes of tingling in the right wrist and 4th/5th fingers only but nothing as bad as symptoms a few months ago.    These episodes usually just lasts 5 or 10 minutes and are most always occur first thing in the morning when he wakes up. He notes that he sleeps with his arm bent over his chest    He has also noted some left knee tightening at times.   At times, he notes mild left eye blurring.      He denies urinary frequency/urgency and nocturia improved when OSA was treated.    DATA:    CT scan Mar 25, 2015 showed mild small vessel ischemic disease.    MRI 03/26/15 confirmed the CT finding and showed no acute CVA.   He also had an MR angiogram that was normal. An MRA of the neck was normal. MRI of the cervical spine showed multilevel degenerative changes with moderate left greater than right neural foraminal narrowing at C4-C5, moderate left greater than right foraminal narrowing at C5-C6.    He had a NCV/EMG here showing moderate right carpal tunnel syndrome and mild chronic right C7 radiculopathy but no acute findings.       REVIEW OF SYSTEMS: Constitutional: No fevers, chills, sweats, or change in appetite Eyes: No visual changes,  double vision, eye pain Ear, nose and throat: No hearing loss, ear pain, nasal congestion, sore throat Cardiovascular: No chest pain, palpitations Respiratory: No shortness of breath at rest or with exertion.   No wheezes.   Has OSA on CPAP GastrointestinaI: No nausea, vomiting, diarrhea, abdominal pain, fecal incontinence Genitourinary: No dysuria, urinary retention or frequency.  No nocturia. Musculoskeletal: No neck pain, back pain Integumentary: No rash, pruritus, skin lesions Neurological: as above Psychiatric: No depression at this time.  No anxiety Endocrine: No palpitations, diaphoresis, change in appetite, change in weigh or increased thirst Hematologic/Lymphatic: No anemia, purpura, petechiae. Allergic/Immunologic: No itchy/runny eyes, nasal congestion, recent allergic reactions, rashes  ALLERGIES: Allergies  Allergen Reactions  . Accupril [Quinapril Hcl] Swelling    Mouth swelling    HOME MEDICATIONS:  Current outpatient prescriptions:  .  allopurinol (ZYLOPRIM) 300 MG tablet, Take 300 mg by mouth daily., Disp: , Rfl:  .  aspirin EC 81 MG tablet, Take 81 mg by mouth daily., Disp: , Rfl:  .  AZOR 10-40 MG per tablet, TAKE 1 TABLET BY MOUTH DAILY, Disp: 90 tablet, Rfl: 2 .  B-D ULTRAFINE III SHORT PEN 31G X 8 MM MISC, , Disp: , Rfl:  .  BRILINTA 60 MG TABS tablet, , Disp: , Rfl:  .  Cod Liver Oil w/Vit A & D CAPS, Take 1 capsule by  mouth daily., Disp: , Rfl:  .  diphenhydrAMINE (BENADRYL) 25 mg capsule, Take 25 mg by mouth every 6 (six) hours as needed for itching., Disp: , Rfl:  .  dorzolamide-timolol (COSOPT) 22.3-6.8 MG/ML ophthalmic solution, Place 1 drop into both eyes 2 (two) times daily., Disp: , Rfl:  .  fenofibrate (TRICOR) 145 MG tablet, Take 145 mg by mouth daily., Disp: , Rfl:  .  furosemide (LASIX) 40 MG tablet, Take a 1/2 to 1 tablet a day as needed., Disp: , Rfl:  .  Insulin Glargine (LANTUS SOLOSTAR [place]), Inject 55 Units into the skin at bedtime., Disp: ,  Rfl:  .  metFORMIN (GLUCOPHAGE) 500 MG tablet, Take 500 mg by mouth 3 (three) times daily., Disp: , Rfl:  .  Methylcellulose, Laxative, (CITRUCEL PO), Take 1 tablet by mouth daily., Disp: , Rfl:  .  metoprolol succinate (TOPROL-XL) 100 MG 24 hr tablet, , Disp: , Rfl:  .  simvastatin (ZOCOR) 40 MG tablet, Take 1 tablet (40 mg total) by mouth daily., Disp: 90 tablet, Rfl: 3 .  travoprost, benzalkonium, (TRAVATAN) 0.004 % ophthalmic solution, Place 1 drop into both eyes at bedtime., Disp: , Rfl:  .  VYTORIN 10-40 MG per tablet, , Disp: , Rfl:  .  betamethasone valerate (VALISONE) 0.1 % cream, , Disp: , Rfl:  .  naproxen sodium (ANAPROX) 220 MG tablet, Take 220 mg by mouth 2 (two) times daily with a meal., Disp: , Rfl:  .  terbinafine (LAMISIL) 250 MG tablet, , Disp: , Rfl:  .  triamcinolone cream (KENALOG) 0.1 %, , Disp: , Rfl:   PAST MEDICAL HISTORY: Past Medical History  Diagnosis Date  . CAD S/P percutaneous coronary angioplasty 2011    PCI of PDA Endeavor 2.5 mm x 12 mm DES - [facility]  . Obesity, Class II, BMI 35-39.9, with comorbidity     BMI 37  . Hypertension, essential   . OSA on CPAP   . Diabetes mellitus, type II, insulin dependent     No longer on insulin according to his current med list   . Dyslipidemia, goal LDL below 70   . Gout   . Glaucoma     PAST SURGICAL HISTORY: Past Surgical History  Procedure Laterality Date  . Prostatectomy  1995  . Coronary angioplasty with stent placement  2001    Endeavor 2.5 mm x 12 mm DES - distal PDA ([facility])  . Nm myoview ltd  June 2013    Treadmill Myoview: 10 minutes, 12 METS; no ischemia infarction, EF 65%    FAMILY HISTORY: History reviewed. No pertinent family history.  SOCIAL HISTORY:  Social History   Social History  . Marital Status: Unknown    Spouse Name: N/A  . Number of Children: N/A  . Years of Education: N/A   Occupational History  . Not on file.   Social History  Main Topics  . Smoking status: Former Smoker    Types: Cigarettes    Quit date: 06/04/1993  . Smokeless tobacco: Not on file  . Alcohol Use: Yes  . Drug Use: No  . Sexual Activity: Not on file   Other Topics Concern  . Not on file   Social History Narrative   Married father of 3. Had been walking up to 2 miles every other day appetite is good his weight for about half an hour time period, but limited now due to back pain. Occasional alcohol. No tobacco products --  he quit about 20-30 years ago.     PHYSICAL EXAM  Filed Vitals:   06/23/15 1047  BP: 142/70  Pulse: 76  Resp: 16  Height: 6' (1.829 m)  Weight: 267 lb 9.6 oz (121.383 kg)    Body mass index is 36.29 kg/(m^2).   General: The patient is well-developed and well-nourished and in no acute distress  Neck: The neck is slightly tender around C7-T2 .  Skin: Extremities are without significant edema.  Musculoskeletal:  Back is nontender  Neurologic Exam  Mental status: The patient is alert and oriented x 3 at the time of the examination.   Cranial nerves: Extraocular movements are full.  Facial strength is normal.  Trapezius and sternocleidomastoid strength is normal. No dysarthria is noted.  No obvious hearing deficits are noted.  Motor:  Muscle bulk is normal.   Tone is normal. Strength is  5 / 5 in all 4 extremities except 4+/5 right APB muscle (median innervated)  Sensory: Sensory testing is intact to pinprick, soft touch and vibration sensation in all 4 extremities.  Coordination: Cerebellar testing reveals good finger-nose-finger    Gait and station: Station is normal.   Gait is normal.   Reflexes: Deep tendon reflexes are symmetric and normal bilaterally.       DIAGNOSTIC DATA (LABS, IMAGING, TESTING) - I reviewed patient records, labs, notes, testing and imaging myself where available.  No results found for: WBC, HGB, HCT, MCV, PLT    Component Value Date/Time   NA 140 08/06/2014 1041   K 4.0  08/06/2014 1041   CL 106 08/06/2014 1041   CO2 22 08/06/2014 1041   GLUCOSE 156* 08/06/2014 1041   BUN 17 08/06/2014 1041   CREATININE 1.11 08/06/2014 1041   CALCIUM 9.8 08/06/2014 1041   PROT 7.2 08/06/2014 1041   ALBUMIN 4.5 08/06/2014 1041   AST 24 08/06/2014 1041   ALT 24 08/06/2014 1041   ALKPHOS 73 08/06/2014 1041   BILITOT 0.5 08/06/2014 1041   Lab Results  Component Value Date   CHOL 109 08/06/2014   HDL 32* 08/06/2014   LDLCALC 56 08/06/2014   TRIG 106 08/06/2014   CHOLHDL 3.4 08/06/2014     ASSESSMENT AND PLAN  No diagnosis found.  1.    Continue to be active, exercise as tolerated. 2.  If knee pain acts up, may need to see ortho.   He is not good NSAID candidate as he has IDDM.    Advised to use the OTC NSAID's very sparingly.    3.    If radicular pain worsens, consider gabapentin. He will return to see me in 2 months or sooner if there are new or worsening neurologic symptoms.   Dshawn Mcnay A. Felecia Shelling, MD, PhD 0/27/7412, 87:86 AM Certified in Neurology, Clinical Neurophysiology, Sleep Medicine, Pain Medicine and Neuroimaging  [facility] [address] [place], [place] [address] [phone]

## 2015-07-02 DIAGNOSIS — E118 Type 2 diabetes mellitus with unspecified complications: Secondary | ICD-10-CM | POA: Diagnosis not present

## 2015-07-08 DIAGNOSIS — I2510 Atherosclerotic heart disease of native coronary artery without angina pectoris: Secondary | ICD-10-CM | POA: Diagnosis not present

## 2015-07-08 DIAGNOSIS — I10 Essential (primary) hypertension: Secondary | ICD-10-CM | POA: Diagnosis not present

## 2015-07-08 DIAGNOSIS — E118 Type 2 diabetes mellitus with unspecified complications: Secondary | ICD-10-CM | POA: Diagnosis not present

## 2015-07-08 DIAGNOSIS — L309 Dermatitis, unspecified: Secondary | ICD-10-CM | POA: Diagnosis not present

## 2015-07-13 DIAGNOSIS — H4011X2 Primary open-angle glaucoma, moderate stage: Secondary | ICD-10-CM | POA: Diagnosis not present

## 2015-07-26 ENCOUNTER — Telehealth: Payer: Self-pay | Admitting: *Deleted

## 2015-07-26 DIAGNOSIS — E118 Type 2 diabetes mellitus with unspecified complications: Secondary | ICD-10-CM | POA: Diagnosis not present

## 2015-07-26 NOTE — Telephone Encounter (Signed)
Spoke with patient and he can see Dr. Ellyn Hack in [place] on Wednesday. Message routed to [facility] to schedule

## 2015-07-26 NOTE — Telephone Encounter (Signed)
Patient walked in with complaints of chest pain (described as a "tiredness" on the left side of chest) and shortness of breath that has been off and on for 2-3 months. He states he notices the chest pain when he uses his CPAP but it is not always linked to this. He states his SOB is not constant and he cannot associate it with rest or with exertion - it can occur with both. During our conversation he felt like he had to take a "deep breath" but was not audibly or visibly in any distress. There was no shortness of breath noticed by RN when he walked from check in to room for work up and no shortness of breath noted when he was talking. He states he was switched to from plavix to Brilinta by his former cardiologist in [place] over the summer (has been on [place] since at least 04/2015) - he is not able to discern if his symptoms began around the time of the med change. He reports he was having right arm pain but this has been diagnosed as a pinched nerve. He states he exercises daily to every other day and cannot link his symptoms to this either. He has not taken any PRN medications for his complains. He does not check his VS regularly.  BP 127/69 HR 70 Weight: 267lbs  Patient requests to see Dr. Ellyn Hack but no appointments are available until >1 month. Patient scheduled to see R. Barrett, PA on 08/02/15 @ 3pm. He is scheduled to see his PCP on Thursday 9/22  He had lab work today and he was asked to have his PCP send the results to our office for review.   Message routed to Dr. Ellyn Hack for review and advice as necessary

## 2015-07-26 NOTE — Telephone Encounter (Signed)
I am not sure of my availability - only 1 clinic day this week @ [facility].  I can probably fit him in there, if not - ? i could potentially see him in the [place] office this Wednesday.   Have only PM clinics next week with rounds -- hard to add on there.  If not, can we get him in to  See an APP this week?  Hard to decipher this via telephone.  [place]

## 2015-07-26 NOTE — Telephone Encounter (Signed)
LM for patient to call back to see if he can see Dr. Ellyn Hack in [facility] 9/21. Billie can schedule appointment.

## 2015-07-27 NOTE — Telephone Encounter (Signed)
Patient scheduled to see Dr. Ellyn Hack 9/21 in [place].

## 2015-07-28 ENCOUNTER — Ambulatory Visit (INDEPENDENT_AMBULATORY_CARE_PROVIDER_SITE_OTHER): Payer: Medicare Other | Admitting: Cardiology

## 2015-07-28 ENCOUNTER — Encounter: Payer: Self-pay | Admitting: Cardiology

## 2015-07-28 VITALS — BP 140/68 | HR 77 | Ht 72.0 in | Wt 263.2 lb

## 2015-07-28 DIAGNOSIS — R0602 Shortness of breath: Secondary | ICD-10-CM

## 2015-07-28 DIAGNOSIS — E785 Hyperlipidemia, unspecified: Secondary | ICD-10-CM

## 2015-07-28 DIAGNOSIS — I2510 Atherosclerotic heart disease of native coronary artery without angina pectoris: Secondary | ICD-10-CM | POA: Diagnosis not present

## 2015-07-28 DIAGNOSIS — Z9861 Coronary angioplasty status: Secondary | ICD-10-CM | POA: Diagnosis not present

## 2015-07-28 DIAGNOSIS — I10 Essential (primary) hypertension: Secondary | ICD-10-CM

## 2015-07-28 DIAGNOSIS — Z794 Long term (current) use of insulin: Secondary | ICD-10-CM

## 2015-07-28 DIAGNOSIS — E669 Obesity, unspecified: Secondary | ICD-10-CM

## 2015-07-28 DIAGNOSIS — E119 Type 2 diabetes mellitus without complications: Secondary | ICD-10-CM

## 2015-07-28 DIAGNOSIS — R079 Chest pain, unspecified: Secondary | ICD-10-CM | POA: Diagnosis not present

## 2015-07-28 NOTE — Progress Notes (Signed)
PCP: Dwan Bolt, MD  Clinic Note: Chief Complaint  Patient presents with  . other    Pt. c/o back pain that radiates to his chest and down left arm, occas. shortness of breath, he also is c/o rash on right arm and upper left back; symptoms for 2-3 months. Meds reviewed by the patient verbally.   . Coronary Artery Disease    HPI: Jacob Berger is a 69 y.o. male with a PMH below who presents today for six-month followup of his CAD. As you recall he underwent right catheterization with PCI in in 2011 while he was still living in [place]. His presenting symptom was a discomfort sensation in his chest with palpitations. He had PCI to the PDA with an endeavor DES stent. Next low semisolid and 1 she he was doing quite well. No active symptoms...  [name] was last seen in March 2016 in our [place] office.  Recent Hospitalizations: in roughly April he was admitted to hospital in [place] - for right hand/arm paresthesias and discomfort/numbness. This was concerning for possible TIA or CVA. He had an MRI/MRA of head and neck, as well as carotid Dopplers and CT of the head which did not show any stroke or any carotid disease. There was mild peri-White matter ischemic changes, but no acute findings. 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  Studies Reviewed: I reviewed his discharge summary and MRI/MRA and other studies from [place]. He brought the entire ream of paper. As the results were verbally significant, did not clinical every single finding. He had P2Y12 inhibtion assay checked by his other cardiologist for. Is being a TIA.  He was on Plavix and the TRU was work high suggesting Plavix nonresponsiveness. Therefore he was switched to Brilinta.  Interval History: [name] has been doing relatively well since his last visit, but ever since he had that hospital stay in [place], he's been noticing having difficulty with  breathing it's not necessarily associated with exertion. He still goes to the gym and works out denying any symptoms while working out. He more doses at after he works out. He also is noticing some dull pressure like sensation it will then go initiating that pattern either from the back to front or front to the back.  This tends to occur at same time his dyspnea. He definitely notices when he is exerting himself and does not recall having done any strenuous activity that may cause injury. He describes it in the left upper chest and going to bed at the shoulder mostly straight through to the back. It will also begin in the back and will come to the front. He seems to be quite concerned about this symptom because the stent there are some similarities to when he had his initial stents were used having some strange sensations and occasional palpitations that he really couldn't describe completely. He's had a few mild palpitations but nothing rapid or irregular. No further TIA or amaurosis fugax symptoms. He also has some mild gasping for breath episodes. Mild worsening in again today but no significant PND or orthopnea. He uses CPAP so is not the best at this symptoms recollection.  No syncope/near syncope. No furtherTIA/amaurosis fugax symptoms.   Past Medical History  Diagnosis Date  . CAD S/P percutaneous coronary angioplasty 2011    PCI of PDA Endeavor 2.5 mm x 12 mm DES - [place]  . Obesity, Class II, BMI 35-39.9, with comorbidity  BMI 37  . Hypertension, essential   . OSA on CPAP   . Diabetes mellitus, type II, insulin dependent     No longer on insulin according to his current med list   . Dyslipidemia, goal LDL below 70   . Gout   . Glaucoma     Past Surgical History  Procedure Laterality Date  . Prostatectomy  1995  . Coronary angioplasty with stent placement  2001    Endeavor 2.5 mm x 12 mm DES - distal PDA ([place] [facility])  . Nm myoview ltd   June 2013    Treadmill Myoview: 10 minutes, 12 METS; no ischemia infarction, EF 65%    ROS: A comprehensive was performed. Review of Systems  Constitutional: Negative for weight loss and malaise/fatigue.  HENT: Negative for nosebleeds.   Respiratory: Positive for shortness of breath (Mostly exertional).   Cardiovascular: Positive for chest pain and leg swelling (Mild in the end of the day). Negative for palpitations.  Gastrointestinal: Positive for heartburn.  Skin: Positive for rash (Itchy rash on left upper back, right arm and a little chest. -- Looks dry & scaly).  Neurological: Positive for dizziness (occasional with dyspnea.).  Endo/Heme/Allergies: Does not bruise/bleed easily.  All other systems reviewed and are negative.   Prior to Admission medications   Medication Sig Start Date End Date Taking? Authorizing Provider  Albiglutide (TANZEUM) 50 MG PEN Inject into the skin once a week.   Yes Historical Provider, MD  allopurinol (ZYLOPRIM) 300 MG tablet Take 300 mg by mouth daily.   Yes Historical Provider, MD  aspirin EC 81 MG tablet Take 81 mg by mouth daily.   Yes Historical Provider, MD  AZOR 10-40 MG per tablet TAKE 1 TABLET BY MOUTH DAILY 12/18/13  Yes Leonie Man, MD  B-D ULTRAFINE III SHORT PEN 31G X 8 MM MISC  02/23/15  Yes Historical Provider, MD  betamethasone valerate (VALISONE) 0.1 % cream  04/01/15  Yes Historical Provider, MD  BRILINTA 60 MG TABS tablet Take 60 mg by mouth.  04/02/15  Yes Historical Provider, MD  [facility] Liver Oil w/Vit A & D CAPS Take 1 capsule by mouth daily.   Yes Historical Provider, MD  diphenhydrAMINE (BENADRYL) 25 mg capsule Take 25 mg by mouth every 6 (six) hours as needed for itching.   Yes Historical Provider, MD  dorzolamide-timolol (COSOPT) 22.3-6.8 MG/ML ophthalmic solution Place 1 drop into both eyes 2 (two) times daily.   Yes Historical Provider, MD  fenofibrate (TRICOR) 145 MG tablet Take 145 mg by mouth daily.   Yes Historical Provider, MD   furosemide (LASIX) 40 MG tablet Take a 1/2 to 1 tablet a day as needed.   Yes Historical Provider, MD  Insulin Glargine (LANTUS SOLOSTAR [place]) Inject 55 Units into the skin at bedtime.   Yes Historical Provider, MD  metFORMIN (GLUCOPHAGE) 500 MG tablet Take 500 mg by mouth 3 (three) times daily.   Yes Historical Provider, MD  Methylcellulose, Laxative, (CITRUCEL PO) Take 1 tablet by mouth daily.   Yes Historical Provider, MD  metoprolol succinate (TOPROL-XL) 100 MG 24 hr tablet Take 100 mg by mouth daily.  04/23/15  Yes Historical Provider, MD  naproxen sodium (ANAPROX) 220 MG tablet Take 220 mg by mouth 2 (two) times daily with a meal.   Yes Historical Provider, MD  simvastatin (ZOCOR) 40 MG tablet Take 1 tablet (40 mg total) by mouth daily. 08/12/14  Yes Leonie Man, MD  terbinafine (LAMISIL)  250 MG tablet Take 250 mg by mouth daily.  04/13/15  Yes Historical Provider, MD  travoprost, benzalkonium, (TRAVATAN) 0.004 % ophthalmic solution Place 1 drop into both eyes at bedtime.   Yes Historical Provider, MD  triamcinolone cream (KENALOG) 0.1 %  04/09/15  Yes Historical Provider, MD  VYTORIN 10-40 MG per tablet 1 tablet.  04/27/15  Yes Historical Provider, MD   Allergies  Allergen Reactions  . Accupril [Quinapril Hcl] Swelling    Mouth swelling    Social History   Social History  . Marital Status: Unknown    Spouse Name: N/A  . Number of Children: N/A  . Years of Education: N/A   Social History Main Topics  . Smoking status: Former Smoker    Types: Cigarettes    Quit date: 06/04/1993  . Smokeless tobacco: None  . Alcohol Use: Yes  . Drug Use: No  . Sexual Activity: Not Asked   Other Topics Concern  . None   Social History Narrative   Married father of 3. Had been walking up to 2 miles every other day appetite is good his weight for about half an hour time period, but limited now due to back pain. Occasional alcohol. No tobacco products -- he quit about 20-30 years ago.   Family  History  Problem Relation Age of Onset  . Family history unknown: Yes    Wt Readings from Last 3 Encounters:  07/28/15 263 lb 4 oz (119.409 kg)  06/23/15 267 lb 9.6 oz (121.383 kg)  04/29/15 262 lb 9.6 oz (119.115 kg)    PHYSICAL EXAM BP 140/68 mmHg  Pulse 77  Ht 6' (1.829 m)  Wt 263 lb 4 oz (119.409 kg)  BMI 35.70 kg/m2 General appearance: alert and oriented x3, cooperative, appears stated age, no distress, moderately obese and well-groomed, well-nourished. Pleasant mood and affect. Answers questions properly.  HEENT: [place]/AT, EOMI, MMM, anicteric sclera  Neck: no adenopathy, no JVD, supple, symmetrical, trachea midline and he may have a soft right carotid bruit versus just radiation of his systolic murmur.  Lungs: clear to auscultation bilaterally, normal percussion bilaterally and nonlabored with good air movement.  Heart: regular rate and rhythm, S1, S2 normal, no S3 or S4, systolic murmur: systolic ejection 1/6, crescendo and decrescendo at 2nd right intercostal space, radiates to carotids, no click and no rub ;; I was not able to reproduce the discomfort that really seemed to be just below the left clavicle. Abdomen: soft, non-tender; bowel sounds normal; no masses, no organomegaly and moderate obesity  Extremities: no C/C/E; Pulses: 2+ and symmetric  Neurologic: Grossly normal Skin: He does have a slightly erythematous scaly maculopapular rash on the right forearm as well as the left upper back and less pronounced on the chest.    Adult ECG Report  Rate: 77 ;  Rhythm: normal sinus rhythm; normal axis, intervals and durations. Mild nonspecific ST and T-wave changes.  Narrative Interpretation: normal EKG.   Other studies Reviewed: Additional studies/ records that were reviewed today include:  Recent Labs:  No recent labs    ASSESSMENT / PLAN: Problem List Items Addressed This Visit    CAD S/P percutaneous coronary angioplasty (Chronic)    He tells me these had 2  additional stents beyond what I have listed. Unfortunately none of those studies. He was switched by his cardiologist from [place] to [place] plus baby aspirin, but is well beyond the mandatory coverage times a week and hold aspirin if necessary. Concerning potential side  effect of Brilinta. Since he was just switched to this, we'll try the use of caffeine as a cataract in mechanism for this dyspnea. No bleeding issues. He is on ARB/ calcium channel blocker and beta blocker along with statin. He is not showing signs of bradycardia and with high-dose Toprol.      Chest pain with moderate risk for cardiac etiology - Primary    His symptoms are somewhat atypical. They seem to be more associated with post stress than with during stress. However he really did not have classic anginal type symptoms when he had his PCI done  Years ago. I want him to continue to do carpal exercising I think he is starting to feel weary of that. I do think it's reasonable therefore to assess this chest discomfort which with his history and risk factors it has at least moderate risk of being cardiac in etiology.  Plan: Treadmill Myoview Stress Test      Relevant Orders   EKG 12-Lead   Diabetes mellitus, type II, insulin dependent (Chronic)   Relevant Medications   Albiglutide (TANZEUM) 50 MG PEN   Dyslipidemia, goal LDL below 70 (Chronic)    He is now on fenofibrate along with statin.  He has not had labs checked that I can see. Monitor for toxicity effects of this combination. He reports to me that his PCP should be checking his labs.  I look forward to seeing results.      Hypertension, essential (Chronic)    He is on several medications with continued borderline control. He is taking Lasix and therefore I think using some chlorthalidone would probably be less helpful. Consider using spironolactone.      Obesity (BMI 30-39.9) (Chronic)   Relevant Medications   Albiglutide (TANZEUM) 50 MG PEN   Right arm  numbness   SOB (shortness of breath)    He's having more episodes of dyspnea that very well could be obesity related, but I am more concerned that may be related to Brilinta as a side effect. I talked about taking Brilinta with caffeine to see if this will help obviate that side effect. The fact that this symptom often times will occur with the chest discomfort however is a little bit concerning and therefore for additional potential anginal feature I plan to evaluate with a Myoview stress test.      Relevant Orders   EKG 12-Lead   NM Myocar Multi W/Spect W/Wall Motion / EF      Current medicines are reviewed at length with the patient today. (+/- concerns) recently changed to [place]. The following changes have been made: take once a week caffeine  Studies Ordered:   Orders Placed This Encounter  Procedures  . NM Myocar Multi W/Spect W/Wall Motion / EF  . EKG 12-Lead      Leonie Man, M.D., M.S. Interventional Cardiologist   Pager # [phone]

## 2015-07-28 NOTE — Patient Instructions (Addendum)
Medication Instructions:  Your physician recommends that you continue on your current medications as directed. Please refer to the Current Medication list given to you today.   Labwork: none  Testing/Procedures: Your physician has requested that you have a lexiscan myoview. For further information please visit [email or web address]. Please follow instruction sheet, as given.  [place]  Your caregiver has ordered a Stress Test with nuclear imaging. The purpose of this test is to evaluate the blood supply to your heart muscle. This procedure is referred to as a "Non-Invasive Stress Test." This is because other than having an IV started in your vein, nothing is inserted or "invades" your body. Cardiac stress tests are done to find areas of poor blood flow to the heart by determining the extent of coronary artery disease (CAD). Some patients exercise on a treadmill, which naturally increases the blood flow to your heart, while others who are  unable to walk on a treadmill due to physical limitations have a pharmacologic/chemical stress agent called Lexiscan . This medicine will mimic walking on a treadmill by temporarily increasing your coronary blood flow.   Please note: these test may take anywhere between 2-4 hours to complete  PLEASE REPORT TO [place] WILL DIRECT YOU WHERE TO GO  Date of Procedure: Tues, Sept 27. 8:30am Arrival Time for Procedure:_8:00am__  Instructions regarding medication:   __xx__ : Hold metformin 24 hours before and 48 hours after procedure.  Take 1/2 dose or no evening dose of insulin the night before    __xx__:  Hold metoprolol night before procedure and morning of procedure  _xx___:  Hold other medications as follows: lasix the morning of ___________________________________________________________________________________________  PLEASE NOTIFY THE OFFICE AT LEAST 24 HOURS IN ADVANCE IF YOU ARE UNABLE TO KEEP  YOUR APPOINTMENT.  [phone] AND  PLEASE NOTIFY NUCLEAR MEDICINE AT [facility] AT LEAST 24 HOURS IN ADVANCE IF YOU ARE UNABLE TO KEEP YOUR APPOINTMENT. [phone]  How to prepare for your Myoview test:  1. Do not eat or drink after midnight 2. No caffeine for 24 hours prior to test 3. No smoking 24 hours prior to test. 4. Your medication may be taken with water.  If your doctor stopped a medication because of this test, do not take that medication. 5. Ladies, please do not wear dresses.  Skirts or pants are appropriate. Please wear a short sleeve shirt. 6. No perfume, cologne or lotion. 7. Wear comfortable walking shoes. No heels!            Follow-Up: Your physician wants you to follow-up in: six months unless treadmill myoview is abnormal. You will receive a reminder letter in the mail two months in advance. If you don't receive a letter, please call our office to schedule the follow-up appointment.   Any Other Special Instructions Will Be Listed Below (If Applicable).

## 2015-07-29 ENCOUNTER — Encounter: Payer: Self-pay | Admitting: Cardiology

## 2015-07-29 DIAGNOSIS — L309 Dermatitis, unspecified: Secondary | ICD-10-CM | POA: Diagnosis not present

## 2015-07-29 DIAGNOSIS — E789 Disorder of lipoprotein metabolism, unspecified: Secondary | ICD-10-CM | POA: Diagnosis not present

## 2015-07-29 DIAGNOSIS — E118 Type 2 diabetes mellitus with unspecified complications: Secondary | ICD-10-CM | POA: Diagnosis not present

## 2015-07-29 NOTE — Assessment & Plan Note (Signed)
He's having more episodes of dyspnea that very well could be obesity related, but I am more concerned that may be related to Brilinta as a side effect. I talked about taking Brilinta with caffeine to see if this will help obviate that side effect. The fact that this symptom often times will occur with the chest discomfort however is a little bit concerning and therefore for additional potential anginal feature I plan to evaluate with a Myoview stress test.

## 2015-07-29 NOTE — Assessment & Plan Note (Signed)
He is now on fenofibrate along with statin.  He has not had labs checked that I can see. Monitor for toxicity effects of this combination. He reports to me that his PCP should be checking his labs.  I look forward to seeing results.

## 2015-07-29 NOTE — Assessment & Plan Note (Signed)
He tells me these had 2 additional stents beyond what I have listed. Unfortunately none of those studies. He was switched by his cardiologist from [place] to [place] plus baby aspirin, but is well beyond the mandatory coverage times a week and hold aspirin if necessary. Concerning potential side effect of Brilinta. Since he was just switched to this, we'll try the use of caffeine as a cataract in mechanism for this dyspnea. No bleeding issues. He is on ARB/ calcium channel blocker and beta blocker along with statin. He is not showing signs of bradycardia and with high-dose Toprol.

## 2015-07-29 NOTE — Assessment & Plan Note (Signed)
His symptoms are somewhat atypical. They seem to be more associated with post stress than with during stress. However he really did not have classic anginal type symptoms when he had his PCI done  Years ago. I want him to continue to do carpal exercising I think he is starting to feel weary of that. I do think it's reasonable therefore to assess this chest discomfort which with his history and risk factors it has at least moderate risk of being cardiac in etiology.  Plan: Treadmill Myoview Stress Test

## 2015-07-29 NOTE — Assessment & Plan Note (Signed)
He is on several medications with continued borderline control. He is taking Lasix and therefore I think using some chlorthalidone would probably be less helpful. Consider using spironolactone.

## 2015-08-02 ENCOUNTER — Ambulatory Visit: Payer: Medicare Other | Admitting: Physician Assistant

## 2015-08-03 ENCOUNTER — Ambulatory Visit
Admission: RE | Admit: 2015-08-03 | Discharge: 2015-08-03 | Disposition: A | Discharge status: Home / Self Care | Payer: Medicare Other | Source: Ambulatory Visit | Attending: Cardiology | Admitting: Cardiology

## 2015-08-03 DIAGNOSIS — R0602 Shortness of breath: Secondary | ICD-10-CM | POA: Insufficient documentation

## 2015-08-03 MED ORDER — TECHNETIUM TC 99M SESTAMIBI - CARDIOLITE
13.5700 | Freq: Once | INTRAVENOUS | Status: AC | PRN
  Administered 2015-08-03: 13.57 via INTRAVENOUS

## 2015-08-03 MED ORDER — TECHNETIUM TC 99M SESTAMIBI - CARDIOLITE
30.0000 | Freq: Once | INTRAVENOUS | Status: AC | PRN
  Administered 2015-08-03: 30.84 via INTRAVENOUS

## 2015-08-04 ENCOUNTER — Telehealth: Payer: Self-pay | Admitting: *Deleted

## 2015-08-04 LAB — NM MYOCAR MULTI W/SPECT W/WALL MOTION / EF
CHL CUP NUCLEAR SDS: 0
CHL CUP RESTING HR STRESS: 63 {beats}/min
CSEPEW: 10.4 METS
CSEPHR: 88 %
Exercise duration (min): 8 min
LV dias vol: 72 mL
LV sys vol: 28 mL
Peak HR: 133 {beats}/min
SRS: 3
SSS: 0

## 2015-08-04 NOTE — Telephone Encounter (Signed)
LEFT MESSAGE TO CALL BACK

## 2015-08-04 NOTE — Progress Notes (Signed)
Quick Note:  Stress Test looked good!! No sign of significant Heart Artery Disease. Pump function is normal.  Good news!!.  HARDING,DAVID W, MD  ______ 

## 2015-08-04 NOTE — Telephone Encounter (Signed)
-----   Message from Leonie Man, MD sent at 08/04/2015  5:59 PM EDT ----- Stress Test looked good!! No sign of significant Heart Artery Disease.  Pump function is normal.  Good news!!.  Leonie Man, MD

## 2015-08-06 NOTE — Telephone Encounter (Signed)
Spoke to patient. Result given . Verbalized understanding  

## 2015-08-06 NOTE — Telephone Encounter (Signed)
Left message to call.

## 2015-08-24 DIAGNOSIS — H401132 Primary open-angle glaucoma, bilateral, moderate stage: Secondary | ICD-10-CM | POA: Diagnosis not present

## 2015-09-22 DIAGNOSIS — E118 Type 2 diabetes mellitus with unspecified complications: Secondary | ICD-10-CM | POA: Diagnosis not present

## 2015-09-29 DIAGNOSIS — Z23 Encounter for immunization: Secondary | ICD-10-CM | POA: Diagnosis not present

## 2015-09-29 DIAGNOSIS — E789 Disorder of lipoprotein metabolism, unspecified: Secondary | ICD-10-CM | POA: Diagnosis not present

## 2015-09-29 DIAGNOSIS — I2510 Atherosclerotic heart disease of native coronary artery without angina pectoris: Secondary | ICD-10-CM | POA: Diagnosis not present

## 2015-09-29 DIAGNOSIS — E118 Type 2 diabetes mellitus with unspecified complications: Secondary | ICD-10-CM | POA: Diagnosis not present

## 2015-10-08 DIAGNOSIS — H401132 Primary open-angle glaucoma, bilateral, moderate stage: Secondary | ICD-10-CM | POA: Diagnosis not present

## 2015-11-15 DIAGNOSIS — E118 Type 2 diabetes mellitus with unspecified complications: Secondary | ICD-10-CM | POA: Diagnosis not present

## 2015-11-15 DIAGNOSIS — E789 Disorder of lipoprotein metabolism, unspecified: Secondary | ICD-10-CM | POA: Diagnosis not present

## 2015-11-15 DIAGNOSIS — R05 Cough: Secondary | ICD-10-CM | POA: Diagnosis not present

## 2015-11-15 DIAGNOSIS — N390 Urinary tract infection, site not specified: Secondary | ICD-10-CM | POA: Diagnosis not present

## 2015-11-15 DIAGNOSIS — I10 Essential (primary) hypertension: Secondary | ICD-10-CM | POA: Diagnosis not present

## 2015-11-25 DIAGNOSIS — Z0000 Encounter for general adult medical examination without abnormal findings: Secondary | ICD-10-CM | POA: Diagnosis not present

## 2015-11-25 DIAGNOSIS — N5201 Erectile dysfunction due to arterial insufficiency: Secondary | ICD-10-CM | POA: Diagnosis not present

## 2015-11-25 DIAGNOSIS — R310 Gross hematuria: Secondary | ICD-10-CM | POA: Diagnosis not present

## 2015-12-01 DIAGNOSIS — N201 Calculus of ureter: Secondary | ICD-10-CM | POA: Diagnosis not present

## 2015-12-01 DIAGNOSIS — R310 Gross hematuria: Secondary | ICD-10-CM | POA: Diagnosis not present

## 2015-12-07 ENCOUNTER — Other Ambulatory Visit: Payer: Self-pay | Admitting: Urology

## 2015-12-07 DIAGNOSIS — D49512 Neoplasm of unspecified behavior of left kidney: Secondary | ICD-10-CM

## 2015-12-14 ENCOUNTER — Ambulatory Visit
Admission: RE | Admit: 2015-12-14 | Discharge: 2015-12-14 | Disposition: A | Discharge status: Home / Self Care | Payer: Medicare Other | Source: Ambulatory Visit | Attending: Urology | Admitting: Urology

## 2015-12-14 DIAGNOSIS — D49512 Neoplasm of unspecified behavior of left kidney: Secondary | ICD-10-CM

## 2015-12-22 DIAGNOSIS — N390 Urinary tract infection, site not specified: Secondary | ICD-10-CM | POA: Diagnosis not present

## 2015-12-22 DIAGNOSIS — Z0000 Encounter for general adult medical examination without abnormal findings: Secondary | ICD-10-CM | POA: Diagnosis not present

## 2015-12-22 DIAGNOSIS — E789 Disorder of lipoprotein metabolism, unspecified: Secondary | ICD-10-CM | POA: Diagnosis not present

## 2015-12-22 DIAGNOSIS — E118 Type 2 diabetes mellitus with unspecified complications: Secondary | ICD-10-CM | POA: Diagnosis not present

## 2015-12-22 DIAGNOSIS — Z125 Encounter for screening for malignant neoplasm of prostate: Secondary | ICD-10-CM | POA: Diagnosis not present

## 2015-12-22 DIAGNOSIS — I10 Essential (primary) hypertension: Secondary | ICD-10-CM | POA: Diagnosis not present

## 2015-12-23 ENCOUNTER — Ambulatory Visit
Admission: RE | Admit: 2015-12-23 | Discharge: 2015-12-23 | Disposition: A | Discharge status: Home / Self Care | Payer: Medicare Other | Source: Ambulatory Visit | Attending: Urology | Admitting: Urology

## 2015-12-23 DIAGNOSIS — K8020 Calculus of gallbladder without cholecystitis without obstruction: Secondary | ICD-10-CM | POA: Diagnosis not present

## 2015-12-23 MED ORDER — GADOBENATE DIMEGLUMINE 529 MG/ML IV SOLN
20.0000 mL | Freq: Once | INTRAVENOUS | Status: AC | PRN
  Administered 2015-12-23: 20 mL via INTRAVENOUS

## 2015-12-29 ENCOUNTER — Encounter: Payer: Self-pay | Admitting: Cardiology

## 2015-12-29 ENCOUNTER — Ambulatory Visit (INDEPENDENT_AMBULATORY_CARE_PROVIDER_SITE_OTHER): Payer: Medicare Other | Admitting: Cardiology

## 2015-12-29 VITALS — BP 120/64 | HR 70 | Ht 72.0 in | Wt 261.1 lb

## 2015-12-29 DIAGNOSIS — E785 Hyperlipidemia, unspecified: Secondary | ICD-10-CM | POA: Diagnosis not present

## 2015-12-29 DIAGNOSIS — I2510 Atherosclerotic heart disease of native coronary artery without angina pectoris: Secondary | ICD-10-CM

## 2015-12-29 DIAGNOSIS — R319 Hematuria, unspecified: Secondary | ICD-10-CM | POA: Diagnosis not present

## 2015-12-29 DIAGNOSIS — R0600 Dyspnea, unspecified: Secondary | ICD-10-CM | POA: Diagnosis not present

## 2015-12-29 DIAGNOSIS — E669 Obesity, unspecified: Secondary | ICD-10-CM

## 2015-12-29 DIAGNOSIS — Z9861 Coronary angioplasty status: Secondary | ICD-10-CM | POA: Diagnosis not present

## 2015-12-29 DIAGNOSIS — E789 Disorder of lipoprotein metabolism, unspecified: Secondary | ICD-10-CM | POA: Diagnosis not present

## 2015-12-29 DIAGNOSIS — E118 Type 2 diabetes mellitus with unspecified complications: Secondary | ICD-10-CM | POA: Diagnosis not present

## 2015-12-29 DIAGNOSIS — I10 Essential (primary) hypertension: Secondary | ICD-10-CM | POA: Diagnosis not present

## 2015-12-29 MED ORDER — BRILINTA 60 MG PO TABS: 60.0000 mg | ORAL_TABLET | Freq: Two times a day (BID) | ORAL | Status: DC

## 2015-12-29 NOTE — Patient Instructions (Signed)
Medication Instructions:  Your physician has recommended you make the following change in your medication:  STOP taking aspirin STOP taking bystolic CONTINUE metoprolol 100mg  daily   Labwork: none  Testing/Procedures: none  Follow-Up: Your physician wants you to follow-up in: six months with Dr. Ellyn Hack.  You will receive a reminder letter in the mail two months in advance. If you don't receive a letter, please call our office to schedule the follow-up appointment.   Any Other Special Instructions Will Be Listed Below (If Applicable). Call our office if you continue to have memory issues.      If you need a refill on your cardiac medications before your next appointment, please call your pharmacy.

## 2015-12-29 NOTE — Progress Notes (Signed)
PCP: Jacob Bolt, MD  Clinic Note: Chief Complaint  Patient presents with  . Follow-up    NO COMPLAINTS  . Coronary Artery Disease    HPI: Jacob Berger is a 70 y.o. male with a PMH below who presents today for ~5-6 month f/u of CAD-PCI. S/p Cardiac Cath with PCI-RCA (Endeavor DES) in 2011 (in [place]).Jacob Berger was last seen on 07/28/2015 -- as follow-up for hospitalization in April 2016 for TIA/CVA. However thought probably related to C5-C7 radiculopathy. He was noting exertional dyspnea --> treadmill Myoview ordered  Recent Hospitalizations: None  Studies Reviewed:  We actually discussed findings prior to initiating the interview.  Myoview 08/03/2015 at [facility]: LOW RISK. EF 57%. 10.4 METS low risk scan no ischemia. No wall motion abnormality.  Having Hematuria evaluated by Urology = MRI & CT scan.    Interval History: Jacob Berger presents today doing relatively well. He still has mild aches and pains, but is not had any recurrence of any anginal type chest tightness or pressure with rest or exertion. Mild puffy swelling at the end of the day, but no significant PND orthopnea or edema. He still tries to go to the gym, but has not been doing as much lately. He still notes exertional fatigue, but more fatigue and dyspnea or angina. The rare palpitations but nothing rapid or irregular. He does use CPAP at night.  No lightheadedness, dizziness, weakness or syncope/near syncope. No TIA/amaurosis fugax symptoms. No claudication.  ROS: A comprehensive was performed. Review of Systems  Constitutional: Positive for malaise/fatigue (Less pronounced than last visit).  HENT: Negative for nosebleeds.   Respiratory: Positive for shortness of breath (xertional dyspnea if he overexerts).   Cardiovascular: Positive for leg swelling. Negative for chest pain.  Gastrointestinal: Negative for blood in stool and melena.  Genitourinary: Negative for hematuria.  Musculoskeletal:  Positive for joint pain. Negative for myalgias and falls.  Neurological: Positive for dizziness (Some positional. Some the sound like vertigo). Negative for headaches.  Endo/Heme/Allergies: Bruises/bleeds easily.  Psychiatric/Behavioral: Negative for memory loss. The patient is not nervous/anxious and does not have insomnia.   All other systems reviewed and are negative.    Past Medical History  Diagnosis Date  . CAD S/P percutaneous coronary angioplasty 2011    PCI of PDA Endeavor 2.5 mm x 12 mm DES - [place]  . Obesity, Class II, BMI 35-39.9, with comorbidity (HCC)     BMI 37  . Hypertension, essential   . OSA on CPAP   . Diabetes mellitus, type II, insulin dependent ([place])     No longer on insulin according to his current med list   . Dyslipidemia, goal LDL below 70   . Gout   . Glaucoma     Past Surgical History  Procedure Laterality Date  . Prostatectomy  1995  . Nm myoview ltd  June 2013; Sep 2016    a. Treadmill Myoview: 10 minutes, 12 METS; no ischemia infarction, EF 65%; b. [facility]: LOW RISK. EF 57%. 10.4 METS low risk scan no ischemia. No wall motion abnormality.   . Prostatectomy    . Coronary angioplasty with stent placement  2001    [place]: Endeavor 2.5 mm x 12 mm DES - distal PDA ([facility])   Prior to Admission medications   Medication Sig Start Date End Date Taking? Authorizing Provider  Albiglutide (TANZEUM) 50 MG PEN Inject into the skin once a week.   Yes Historical Provider, MD  allopurinol (ZYLOPRIM) 300 MG tablet Take 300 mg by mouth daily.   Yes Historical Provider, MD  aspirin EC 81 MG tablet Take 81 mg by mouth daily.   Yes Historical Provider, MD  AZOR 10-40 MG per tablet TAKE 1 TABLET BY MOUTH DAILY 12/18/13  Yes Leonie Man, MD  B-D ULTRAFINE III SHORT PEN 31G X 8 MM MISC  02/23/15  Yes Historical Provider, MD  betamethasone valerate (VALISONE) 0.1 % cream Apply 1 application topically daily.  04/01/15  Yes  Historical Provider, MD  BRILINTA 60 MG TABS tablet Take 60 mg by mouth 2 (two) times daily.  04/02/15  Yes Historical Provider, MD  [facility] Liver Oil w/Vit A & D CAPS Take 1 capsule by mouth daily.   Yes Historical Provider, MD  diphenhydrAMINE (BENADRYL) 25 mg capsule Take 25 mg by mouth every 6 (six) hours as needed for itching.   Yes Historical Provider, MD  dorzolamide-timolol (COSOPT) 22.3-6.8 MG/ML ophthalmic solution Place 1 drop into both eyes 2 (two) times daily.   Yes Historical Provider, MD  fenofibrate (TRICOR) 145 MG tablet Take 145 mg by mouth daily.   Yes Historical Provider, MD  furosemide (LASIX) 40 MG tablet Take a 1/2 to 1 tablet a day as needed.   Yes Historical Provider, MD  Insulin Glargine (LANTUS SOLOSTAR [place]) Inject 55 Units into the skin at bedtime.   Yes Historical Provider, MD  metFORMIN (GLUCOPHAGE) 500 MG tablet Take 500 mg by mouth 3 (three) times daily.   Yes Historical Provider, MD  Methylcellulose, Laxative, (CITRUCEL PO) Take 1 tablet by mouth daily.   Yes Historical Provider, MD  metoprolol succinate (TOPROL-XL) 100 MG 24 hr tablet Take 100 mg by mouth daily.  04/23/15  Yes Historical Provider, MD  naproxen sodium (ANAPROX) 220 MG tablet Take 220 mg by mouth 2 (two) times daily with a meal.   Yes Historical Provider, MD  simvastatin (ZOCOR) 40 MG tablet Take 1 tablet (40 mg total) by mouth daily. 08/12/14  Yes Leonie Man, MD  terbinafine (LAMISIL) 250 MG tablet Take 250 mg by mouth daily.  04/13/15  Yes Historical Provider, MD  travoprost, benzalkonium, (TRAVATAN) 0.004 % ophthalmic solution Place 1 drop into both eyes at bedtime.   Yes Historical Provider, MD  triamcinolone cream (KENALOG) 0.1 % Apply 1 application topically 3 (three) times daily.  04/09/15  Yes Historical Provider, MD  VYTORIN 10-40 MG per tablet Take 1 tablet by mouth daily at 6 PM.  04/27/15  Yes Historical Provider, MD    Allergies  Allergen Reactions  . Accupril [Quinapril Hcl] Swelling     Mouth swelling     Social History   Social History  . Marital Status: Married    Spouse Name: N/A  . Number of Children: N/A  . Years of Education: N/A   Social History Main Topics  . Smoking status: Former Smoker    Types: Cigarettes    Quit date: 06/04/1993  . Smokeless tobacco: None  . Alcohol Use: Yes  . Drug Use: No  . Sexual Activity: Not Asked   Other Topics Concern  . None   Social History Narrative   Married father of 3. Had been walking up to 2 miles every other day appetite is good his weight for about half an hour time period, but limited now due to back pain. Occasional alcohol. No tobacco products -- he quit about 20-30 years ago.   Family History  Problem Relation Age of Onset  .  Family history unknown: Yes     Wt Readings from Last 3 Encounters:  12/29/15 261 lb 1.9 oz (118.443 kg)  12/23/15 254 lb (115.214 kg)  07/28/15 263 lb 4 oz (119.409 kg)    PHYSICAL EXAM BP 120/64 mmHg  Pulse 70  Ht 6' (1.829 m)  Wt 261 lb 1.9 oz (118.443 kg)  BMI 35.41 kg/m2 General appearance: alert and oriented x3, cooperative, appears stated age, no distress, moderately obese and well-groomed, well-nourished. Pleasant mood and affect. Answers questions properly.  HEENT: [place]/AT, EOMI, MMM, anicteric sclera  Neck: no adenopathy, no JVD, supple, symmetrical, trachea midline and he may have a soft right carotid bruit versus just radiation of his systolic murmur.  Lungs: clear to auscultation bilaterally, normal percussion bilaterally and nonlabored with good air movement.  Heart: regular rate and rhythm, S1, S2 normal, no S3 or S4; 1/6 c-d SEM @ RUSB --> carotids. No click and no rub  Abdomen: soft, non-tender; bowel sounds normal; no masses, no organomegaly and moderate obesity  Extremities: no C/C/E; Pulses: 2+ and symmetric  Neurologic: Grossly normal Skin: No significant rashes or lesions.    Adult ECG Report Not checked   Other studies  Reviewed: Additional studies/ records that were reviewed today include:  Recent Labs:  PCP checks labs. None available right now    ASSESSMENT / PLAN: Problem List Items Addressed This Visit    Obesity (BMI 30-39.9) (Chronic)    The patient understands the need to lose weight with diet and exercise. We have discussed specific strategies for this.      Hypertension, essential (Chronic)    Actually pretty well-controlled now. Clarify the issue as to which beta blocker initially on. He really is either one of the other and not both. My current records indicate that he is no longer on Bystolic and is on Toprol.      Dyspnea - likely due to increasing obesity    His dyspnea seems to have improved. I wonder if the Lasix is helping with some potential diastolic dysfunction mediated dyspnea. His edema has improved. Unfortunately his weight fluctuates quite a bit.      Dyslipidemia, goal LDL below 70 (Chronic)    He is now on fenofibrate as well as what appears to be Vytorin. We need to monitor for adverse effects. He is also on cod liver oil. We need to clarify what medications he is truly on and monitor for side effects.  His PCP has been following labs. I don't have current results.      CAD S/P percutaneous coronary angioplasty - Primary (Chronic)    Status post PCI while in [place]. He now has had a Myoview stress test that was nonischemic. He actually exercised quite well without significant symptoms either. Seems like his exertional dyspnea and discomfort as improved. It may be related to underlying illness that the time. He is on reduced dose Brilinta. I think we can stop aspirin. No longer having the dyspnea spells with reduced dose. He is on a combination ARB/CCB. I only see the Toprol listed and not Bystolic. In the past he was on Bystolic, but is not currently listed on his medications. He should only be on one or the other. My last note indicated that he was on Toprol only. I  think the visual changes because of insurance related issues. He also has to statins listed one being simvastatin dopamine Vytorin. He thinks he is taking Vytorin only.  Current medicines are reviewed at length with the patient today. (+/- concerns) is exposed to be on both Bystolic and metoprolol The following changes have been made:  Your physician has recommended you make the following change in your medication:  STOP taking aspirin STOP taking bystolic CONTINUE metoprolol 100mg  daily  Studies Ordered:   No orders of the defined types were placed in this encounter.      Leonie Man, M.D., M.S.  [employer]  [address] [place], [place] [address] [phone] Fax [phone]

## 2015-12-31 ENCOUNTER — Other Ambulatory Visit: Payer: Self-pay

## 2015-12-31 ENCOUNTER — Telehealth: Payer: Self-pay

## 2015-12-31 ENCOUNTER — Encounter: Payer: Self-pay | Admitting: Cardiology

## 2015-12-31 MED ORDER — METOPROLOL SUCCINATE ER 100 MG PO TB24: 100.0000 mg | ORAL_TABLET | Freq: Every day | ORAL | Status: DC

## 2015-12-31 NOTE — Assessment & Plan Note (Signed)
His dyspnea seems to have improved. I wonder if the Lasix is helping with some potential diastolic dysfunction mediated dyspnea. His edema has improved. Unfortunately his weight fluctuates quite a bit.

## 2015-12-31 NOTE — Assessment & Plan Note (Signed)
The patient understands the need to lose weight with diet and exercise. We have discussed specific strategies for this.  

## 2015-12-31 NOTE — Assessment & Plan Note (Signed)
Status post PCI while in [place]. He now has had a Myoview stress test that was nonischemic. He actually exercised quite well without significant symptoms either. Seems like his exertional dyspnea and discomfort as improved. It may be related to underlying illness that the time. He is on reduced dose Brilinta. I think we can stop aspirin. No longer having the dyspnea spells with reduced dose. He is on a combination ARB/CCB. I only see the Toprol listed and not Bystolic. In the past he was on Bystolic, but is not currently listed on his medications. He should only be on one or the other. My last note indicated that he was on Toprol only. I think the visual changes because of insurance related issues. He also has to statins listed one being simvastatin dopamine Vytorin. He thinks he is taking Vytorin only.

## 2015-12-31 NOTE — Telephone Encounter (Signed)
S/w pt and reviewed Feb 22 AVS. Stop asa and bystolic. Continue metoprolol 100mg  daily. Refill sent to [facility] Pt verbalized understanding

## 2015-12-31 NOTE — Assessment & Plan Note (Signed)
He is now on fenofibrate as well as what appears to be Vytorin. We need to monitor for adverse effects. He is also on cod liver oil. We need to clarify what medications he is truly on and monitor for side effects.  His PCP has been following labs. I don't have current results.

## 2015-12-31 NOTE — Assessment & Plan Note (Signed)
Actually pretty well-controlled now. Clarify the issue as to which beta blocker initially on. He really is either one of the other and not both. My current records indicate that he is no longer on Bystolic and is on Toprol.

## 2015-12-31 NOTE — Telephone Encounter (Signed)
Pt states Dr . Ellyn Hack wanted pt to stop taking aspirin, and Bystolic. States he hasn't taken Metoprolol either. Please call to confirm this is correct. If he needs to take Metoprolol, he  will need a rx called in to  [place]

## 2016-01-14 ENCOUNTER — Other Ambulatory Visit: Payer: Self-pay | Admitting: Urology

## 2016-01-14 DIAGNOSIS — N2889 Other specified disorders of kidney and ureter: Secondary | ICD-10-CM

## 2016-01-18 ENCOUNTER — Telehealth: Payer: Self-pay | Admitting: *Deleted

## 2016-01-18 DIAGNOSIS — R079 Chest pain, unspecified: Secondary | ICD-10-CM | POA: Diagnosis not present

## 2016-01-18 NOTE — Telephone Encounter (Signed)
Request for surgical clearance:  1. What type of surgery is being performed? Kidney biopsy  2. When is this surgery scheduled? 01/21/16  3. Are there any medications that need to be held prior to surgery and how long Brilinta 60 mg 5 days prior to procedure  4. Name of physician performing surgery? Dr Junious Silk   5. What is your office phone and fax number?  [place]

## 2016-01-20 NOTE — Telephone Encounter (Signed)
OK to hold Brilinta x 5-7 days pre-op.  Leonie Man, MD

## 2016-01-20 NOTE — Telephone Encounter (Signed)
Routed to [place]

## 2016-01-21 ENCOUNTER — Ambulatory Visit (HOSPITAL_COMMUNITY): Payer: Medicare Other

## 2016-01-28 ENCOUNTER — Other Ambulatory Visit: Payer: Self-pay | Admitting: General Surgery

## 2016-01-31 ENCOUNTER — Ambulatory Visit (HOSPITAL_COMMUNITY)
Admission: RE | Admit: 2016-01-31 | Discharge: 2016-01-31 | Disposition: A | Discharge status: Home / Self Care | Payer: Medicare Other | Source: Ambulatory Visit | Attending: Urology | Admitting: Urology

## 2016-01-31 ENCOUNTER — Encounter (HOSPITAL_COMMUNITY): Payer: Self-pay

## 2016-01-31 DIAGNOSIS — N2889 Other specified disorders of kidney and ureter: Secondary | ICD-10-CM | POA: Diagnosis not present

## 2016-01-31 DIAGNOSIS — D49512 Neoplasm of unspecified behavior of left kidney: Secondary | ICD-10-CM | POA: Insufficient documentation

## 2016-01-31 DIAGNOSIS — G4733 Obstructive sleep apnea (adult) (pediatric): Secondary | ICD-10-CM | POA: Diagnosis not present

## 2016-01-31 DIAGNOSIS — H409 Unspecified glaucoma: Secondary | ICD-10-CM | POA: Diagnosis not present

## 2016-01-31 DIAGNOSIS — Z955 Presence of coronary angioplasty implant and graft: Secondary | ICD-10-CM | POA: Insufficient documentation

## 2016-01-31 DIAGNOSIS — I10 Essential (primary) hypertension: Secondary | ICD-10-CM | POA: Diagnosis not present

## 2016-01-31 DIAGNOSIS — E119 Type 2 diabetes mellitus without complications: Secondary | ICD-10-CM | POA: Diagnosis not present

## 2016-01-31 DIAGNOSIS — Z9889 Other specified postprocedural states: Secondary | ICD-10-CM | POA: Diagnosis not present

## 2016-01-31 DIAGNOSIS — Z6837 Body mass index (BMI) 37.0-37.9, adult: Secondary | ICD-10-CM | POA: Insufficient documentation

## 2016-01-31 DIAGNOSIS — E669 Obesity, unspecified: Secondary | ICD-10-CM | POA: Diagnosis not present

## 2016-01-31 DIAGNOSIS — E785 Hyperlipidemia, unspecified: Secondary | ICD-10-CM | POA: Diagnosis not present

## 2016-01-31 DIAGNOSIS — Z79899 Other long term (current) drug therapy: Secondary | ICD-10-CM | POA: Insufficient documentation

## 2016-01-31 DIAGNOSIS — I2510 Atherosclerotic heart disease of native coronary artery without angina pectoris: Secondary | ICD-10-CM | POA: Insufficient documentation

## 2016-01-31 DIAGNOSIS — C642 Malignant neoplasm of left kidney, except renal pelvis: Secondary | ICD-10-CM | POA: Diagnosis not present

## 2016-01-31 DIAGNOSIS — Z7984 Long term (current) use of oral hypoglycemic drugs: Secondary | ICD-10-CM | POA: Insufficient documentation

## 2016-01-31 DIAGNOSIS — Z8546 Personal history of malignant neoplasm of prostate: Secondary | ICD-10-CM | POA: Insufficient documentation

## 2016-01-31 DIAGNOSIS — Z87891 Personal history of nicotine dependence: Secondary | ICD-10-CM | POA: Insufficient documentation

## 2016-01-31 DIAGNOSIS — M109 Gout, unspecified: Secondary | ICD-10-CM | POA: Insufficient documentation

## 2016-01-31 LAB — APTT: aPTT: 30 seconds (ref 24–37)

## 2016-01-31 LAB — CBC
HEMATOCRIT: 34.6 % — AB (ref 39.0–52.0)
Hemoglobin: 11.9 g/dL — ABNORMAL LOW (ref 13.0–17.0)
MCH: 29.2 pg (ref 26.0–34.0)
MCHC: 34.4 g/dL (ref 30.0–36.0)
MCV: 85 fL (ref 78.0–100.0)
PLATELETS: 188 10*3/uL (ref 150–400)
RBC: 4.07 MIL/uL — AB (ref 4.22–5.81)
RDW: 14.4 % (ref 11.5–15.5)
WBC: 6 10*3/uL (ref 4.0–10.5)

## 2016-01-31 LAB — GLUCOSE, CAPILLARY: GLUCOSE-CAPILLARY: 176 mg/dL — AB (ref 65–99)

## 2016-01-31 LAB — PROTIME-INR
INR: 1.15 (ref 0.00–1.49)
Prothrombin Time: 14.4 seconds (ref 11.6–15.2)

## 2016-01-31 MED ORDER — SODIUM CHLORIDE 0.9 % IV SOLN
INTRAVENOUS | Status: DC
  Administered 2016-01-31: 13:00:00 via INTRAVENOUS

## 2016-01-31 MED ORDER — FENTANYL CITRATE (PF) 100 MCG/2ML IJ SOLN
INTRAMUSCULAR | Status: AC
  Filled 2016-01-31: qty 4

## 2016-01-31 MED ORDER — FENTANYL CITRATE (PF) 100 MCG/2ML IJ SOLN
INTRAMUSCULAR | Status: AC | PRN
  Administered 2016-01-31: 50 ug via INTRAVENOUS
  Administered 2016-01-31: 25 ug via INTRAVENOUS

## 2016-01-31 NOTE — Progress Notes (Signed)
Patient arrived in Short Stay at 1130 for biopsy in IR. Stated he had oatmeal at 0730. He also had raisin toast with cheese one hour ago at 1030 with an 8oz bottle of water. Informed Rowe Robert PA who will discuss with MD.

## 2016-01-31 NOTE — Sedation Documentation (Signed)
Patient denies pain and is resting comfortably.  

## 2016-01-31 NOTE — Sedation Documentation (Signed)
No sedation given. Fentanyl only.

## 2016-01-31 NOTE — Discharge Instructions (Signed)
Kidney Biopsy, Care After °Refer to this sheet in the next few weeks. These instructions provide you with information on caring for yourself after your procedure. Your health care provider may also give you more specific instructions. Your treatment has been planned according to current medical practices, but problems sometimes occur. Call your health care provider if you have any problems or questions after your procedure.  °WHAT TO EXPECT AFTER THE PROCEDURE  °· You may notice blood in the urine for the first 24 hours after the biopsy. °· You may feel some pain at the biopsy site for 1-2 weeks after the biopsy. °HOME CARE INSTRUCTIONS °· Do not lift anything heavier than 10 lb (4.5 kg) for 2 weeks. °· Do not take any non-steroidal anti-inflammatory drugs (NSAIDs) or any blood thinners for a week after the biopsy unless instructed to do so by your health care provider. °· Only take medicines for pain, fever, or discomfort as directed by your health care provider. °SEEK MEDICAL CARE IF: °· You have bloody urine more than 24 hours after the biopsy.   °· You develop a fever.   °· You cannot urinate.   °· You have increasing pain at the biopsy site.   °SEEK IMMEDIATE MEDICAL CARE IF: °You feel faint or dizzy.  °  °This information is not intended to replace advice given to you by your health care provider. Make sure you discuss any questions you have with your health care provider. °  °Document Released: 06/25/2013 Document Reviewed: 06/25/2013 °Elsevier Interactive Patient Education ©2016 Elsevier Inc. ° °

## 2016-01-31 NOTE — H&P (Signed)
Chief Complaint: Patient was seen in consultation today for ultrasound-guided left renal mass biopsy  Referring Physician(s): Eskridge,Matthew  Supervising Physician: Hoss,Art  History of Present Illness: Jacob Berger is a 70 y.o. male with past medical history significant for coronary artery disease with prior angioplasty/stenting 2011, obesity, hypertension, obstructive sleep apnea on CPAP , diabetes, gout, glaucoma, dyslipidemia and prostate carcinoma in 1995. Recent MRI of the abdomen performed on 12/23/15 revealed at least 4 solid enhancing lesions within the left kidney which are suspicious for multiple renal cell carcinomas. He presents today following referral from urology for ultrasound-guided left renal mass biopsy.  Past Medical History  Diagnosis Date  . CAD S/P percutaneous coronary angioplasty 2011    PCI of PDA Endeavor 2.5 mm x 12 mm DES - [place]  . Obesity, Class II, BMI 35-39.9, with comorbidity (HCC)     BMI 37  . Hypertension, essential   . OSA on CPAP   . Diabetes mellitus, type II, insulin dependent ([place])     No longer on insulin according to his current med list   . Dyslipidemia, goal LDL below 70   . Gout   . Glaucoma     Past Surgical History  Procedure Laterality Date  . Prostatectomy  1995  . Nm myoview ltd  June 2013; Sep 2016    a. Treadmill Myoview: 10 minutes, 12 METS; no ischemia infarction, EF 65%; b. [facility]: LOW RISK. EF 57%. 10.4 METS low risk scan no ischemia. No wall motion abnormality.   . Prostatectomy    . Coronary angioplasty with stent placement  2001    [place]: Endeavor 2.5 mm x 12 mm DES - distal PDA ([facility] [facility])    Allergies: Accupril  Medications: Prior to Admission medications   Medication Sig Start Date End Date Taking? Authorizing Provider  Albiglutide (TANZEUM) 50 MG PEN Inject into the skin once a week.    Historical Provider, MD  allopurinol (ZYLOPRIM) 300 MG tablet Take 300  mg by mouth daily.    Historical Provider, MD  AZOR 10-40 MG per tablet TAKE 1 TABLET BY MOUTH DAILY 12/18/13   Leonie Man, MD  B-D ULTRAFINE III SHORT PEN 31G X 8 MM MISC  02/23/15   Historical Provider, MD  betamethasone valerate (VALISONE) 0.1 % cream Apply 1 application topically daily.  04/01/15   Historical Provider, MD  BRILINTA 60 MG TABS tablet Take 1 tablet (60 mg total) by mouth 2 (two) times daily. 12/29/15   Leonie Man, MD  [facility] Liver Oil w/Vit A & D CAPS Take 1 capsule by mouth daily.    Historical Provider, MD  diphenhydrAMINE (BENADRYL) 25 mg capsule Take 25 mg by mouth every 6 (six) hours as needed for itching.    Historical Provider, MD  dorzolamide-timolol (COSOPT) 22.3-6.8 MG/ML ophthalmic solution Place 1 drop into both eyes 2 (two) times daily.    Historical Provider, MD  fenofibrate (TRICOR) 145 MG tablet Take 145 mg by mouth daily.    Historical Provider, MD  furosemide (LASIX) 40 MG tablet Take a 1/2 to 1 tablet a day as needed.    Historical Provider, MD  Insulin Glargine (LANTUS SOLOSTAR [place]) Inject 55 Units into the skin at bedtime.    Historical Provider, MD  metFORMIN (GLUCOPHAGE) 500 MG tablet Take 500 mg by mouth 3 (three) times daily.    Historical Provider, MD  Methylcellulose, Laxative, (CITRUCEL PO) Take 1 tablet by mouth daily.  Historical Provider, MD  metoprolol succinate (TOPROL-XL) 100 MG 24 hr tablet Take 1 tablet (100 mg total) by mouth daily. 12/31/15   Leonie Man, MD  naproxen sodium (ANAPROX) 220 MG tablet Take 220 mg by mouth 2 (two) times daily with a meal.    Historical Provider, MD  simvastatin (ZOCOR) 40 MG tablet Take 1 tablet (40 mg total) by mouth daily. 08/12/14   Leonie Man, MD  terbinafine (LAMISIL) 250 MG tablet Take 250 mg by mouth daily.  04/13/15   Historical Provider, MD  travoprost, benzalkonium, (TRAVATAN) 0.004 % ophthalmic solution Place 1 drop into both eyes at bedtime.    Historical Provider, MD  triamcinolone cream  (KENALOG) 0.1 % Apply 1 application topically 3 (three) times daily.  04/09/15   Historical Provider, MD  VYTORIN 10-40 MG per tablet Take 1 tablet by mouth daily at 6 PM.  04/27/15   Historical Provider, MD     Family History  Problem Relation Age of Onset  . Family history unknown: Yes    Social History   Social History  . Marital Status: Married    Spouse Name: N/A  . Number of Children: N/A  . Years of Education: N/A   Social History Main Topics  . Smoking status: Former Smoker    Types: Cigarettes    Quit date: 06/04/1993  . Smokeless tobacco: Not on file  . Alcohol Use: Yes  . Drug Use: No  . Sexual Activity: Not on file   Other Topics Concern  . Not on file   Social History Narrative   Married father of 3. Had been walking up to 2 miles every other day appetite is good his weight for about half an hour time period, but limited now due to back pain. Occasional alcohol. No tobacco products -- he quit about 20-30 years ago.      Review of Systems  Constitutional: Negative for fever and chills.  Respiratory: Positive for cough. Negative for shortness of breath.   Cardiovascular: Negative for chest pain.  Gastrointestinal: Negative for nausea, vomiting, abdominal pain and blood in stool.  Genitourinary: Negative for dysuria and hematuria.  Musculoskeletal:       Occ rt sided flank discomfort; denies left flank pain  Neurological: Negative for headaches.    Vital Signs: Blood pressure 153/74, temperature 98.6, heart rate 75, respirations 18, O2 sat 98% room air   Physical Exam  Constitutional: He is oriented to person, place, and time. He appears well-developed and well-nourished.  Cardiovascular: Normal rate and regular rhythm.   Pulmonary/Chest: Effort normal and breath sounds normal.  Abdominal: Soft. Bowel sounds are normal. There is no tenderness.  obese  Musculoskeletal: Normal range of motion. He exhibits no edema.  Neurological: He is alert and oriented  to person, place, and time.    Mallampati Score:     Imaging: No results found.  Labs:  CBC: No results for input(s): WBC, HGB, HCT, PLT in the last 8760 hours.  COAGS: No results for input(s): INR, APTT in the last 8760 hours.  BMP: No results for input(s): NA, K, CL, CO2, GLUCOSE, BUN, CALCIUM, CREATININE, GFRNONAA, GFRAA in the last 8760 hours.  Invalid input(s): CMP  LIVER FUNCTION TESTS: No results for input(s): BILITOT, AST, ALT, ALKPHOS, PROT, ALBUMIN in the last 8760 hours.  TUMOR MARKERS: No results for input(s): AFPTM, CEA, CA199, CHROMGRNA in the last 8760 hours.  Assessment and Plan: 70 y.o. male with past medical history significant for coronary  artery disease with prior angioplasty/stenting 2011, obesity, hypertension, obstructive sleep apnea on CPAP , diabetes, gout, glaucoma, dyslipidemia and prostate carcinoma in 1995. Recent MRI of the abdomen performed on 12/23/15 revealed at least 4 solid enhancing lesions within the left kidney which are suspicious for multiple renal cell carcinomas. He presents today following referral from urology for ultrasound-guided left renal mass biopsy. Risks and benefits discussed with the patient/wife including, but not limited to bleeding, infection, damage to adjacent structures or low yield requiring additional tests.All of the patient's questions were answered, patient is agreeable to proceed.Consent signed and in chart.  Patient had solid food this morning at 10:30 AM therefore only IV fentanyl will be utilized during procedure.Labs pending. Patient's last dose of Brilinta was on 3/21.    Thank you for this interesting consult.  I greatly enjoyed meeting Chawn Piech and look forward to participating in their care.  A copy of this report was sent to the requesting provider on this date.  Electronically Signed: D. Rowe Robert 01/31/2016, 11:21 AM   I spent a total of 20 minutes in face to face in clinical consultation,  greater than 50% of which was counseling/coordinating care for US guided left renal mass biopsy

## 2016-01-31 NOTE — Procedures (Signed)
L renal mass Bx No comp 18 g core times one

## 2016-02-08 DIAGNOSIS — R310 Gross hematuria: Secondary | ICD-10-CM | POA: Diagnosis not present

## 2016-02-08 DIAGNOSIS — D49512 Neoplasm of unspecified behavior of left kidney: Secondary | ICD-10-CM | POA: Diagnosis not present

## 2016-02-15 DIAGNOSIS — I712 Thoracic aortic aneurysm, without rupture, unspecified: Secondary | ICD-10-CM | POA: Insufficient documentation

## 2016-02-15 HISTORY — DX: Thoracic aortic aneurysm, without rupture, unspecified: I71.20

## 2016-02-15 HISTORY — DX: Thoracic aortic aneurysm, without rupture: I71.2

## 2016-03-03 DIAGNOSIS — C642 Malignant neoplasm of left kidney, except renal pelvis: Secondary | ICD-10-CM | POA: Diagnosis not present

## 2016-03-03 DIAGNOSIS — D49512 Neoplasm of unspecified behavior of left kidney: Secondary | ICD-10-CM | POA: Diagnosis not present

## 2016-03-07 ENCOUNTER — Other Ambulatory Visit: Payer: Self-pay | Admitting: Urology

## 2016-03-07 DIAGNOSIS — N201 Calculus of ureter: Secondary | ICD-10-CM | POA: Diagnosis not present

## 2016-03-10 ENCOUNTER — Encounter: Payer: Self-pay | Admitting: Cardiology

## 2016-03-10 ENCOUNTER — Telehealth: Payer: Self-pay | Admitting: Cardiology

## 2016-03-10 NOTE — Telephone Encounter (Signed)
Forward to MD to make aware.  

## 2016-03-10 NOTE — Telephone Encounter (Signed)
OK - I did see the CT Scan.  Have noted it in his Problem List/PMH.  Will need to f/u Yearly CTA of chest.  Usually not considered "Surgical" until closer to 5.0.  Leonie Man, MD

## 2016-03-10 NOTE — Telephone Encounter (Signed)
Pt calling wanting to give Jacob Berger an update on his health.  Dr Alinda Money his neruologist ordered patient to have  CAT scan It showed Thoracic aortic aneurysm  Just wanted to let Jacob Berger know so we are aware on this. Please call back if we have any questions.

## 2016-03-22 ENCOUNTER — Encounter: Payer: Self-pay | Admitting: Surgery

## 2016-03-22 ENCOUNTER — Institutional Professional Consult (permissible substitution) (INDEPENDENT_AMBULATORY_CARE_PROVIDER_SITE_OTHER): Payer: Medicare Other | Admitting: Surgery

## 2016-03-22 VITALS — BP 122/77 | HR 80 | Resp 16 | Ht 72.0 in | Wt 260.0 lb

## 2016-03-22 DIAGNOSIS — I712 Thoracic aortic aneurysm, without rupture, unspecified: Secondary | ICD-10-CM

## 2016-03-22 NOTE — Progress Notes (Signed)
Cardiothoracic Surgery Consultation   PCP is Dwan Bolt, MD Referring Provider is Raynelle Bring, MD  Chief Complaint  Patient presents with  . TAA    eval with CT CHEST per [facility] 01/18/16    HPI:  The patient is a 70 year old gentleman with a history of coronary artery disease s/p PCI and stenting of the PDA in [place], type 2 DM, hypertension,, OSA on CPAP who has been followed by Dr. Ellyn Hack for his cardiology care. In February he was having some abdominal pain and had an MR of the abdomen which showed at least 4 solid, enhancing lesions in the left kidney suspicious for multiple small renal cell carcinomas. He underwent US biopsy which showed renal cell carcinoma and he is scheduled for left nephrectomy next month. He has a chest CT done at [facility] due to some chest pain and this showed an incidental 4.5 cm fusiform ascending aneurysm.  Past Medical History  Diagnosis Date  . CAD S/P percutaneous coronary angioplasty 2011    PCI of PDA Endeavor 2.5 mm x 12 mm DES - [place] [facility]  . Obesity, Class II, BMI 35-39.9, with comorbidity (HCC)     BMI 37  . Hypertension, essential   . OSA on CPAP   . Diabetes mellitus, type II, insulin dependent ([place])     No longer on insulin according to his current med list   . Dyslipidemia, goal LDL below 70   . Gout   . Glaucoma   . Thoracic aortic aneurysm without rupture ([place]) - Noted on chest CT. 4.5 cm 02/15/2016    Past Surgical History  Procedure Laterality Date  . Prostatectomy  1995  . Nm myoview ltd  June 2013; Sep 2016    a. Treadmill Myoview: 10 minutes, 12 METS; no ischemia infarction, EF 65%; b. [facility]: LOW RISK. EF 57%. 10.4 METS low risk scan no ischemia. No wall motion abnormality.   . Prostatectomy    . Coronary angioplasty with stent placement  2001    [place]: Endeavor 2.5 mm x 12 mm DES - distal PDA ([place] [facility])    Family History  Problem Relation Age of  Onset  . Family history unknown: Yes    Social History Social History  Substance Use Topics  . Smoking status: Former Smoker    Types: Cigarettes    Quit date: 06/04/1993  . Smokeless tobacco: Former Systems developer    Types: Chew     Comment: [place]  . Alcohol Use: 0.0 oz/week    0 Standard drinks or equivalent per week    Current Outpatient Prescriptions  Medication Sig Dispense Refill  . Albiglutide (TANZEUM) 50 MG PEN Inject into the skin once a week.    Marland Kitchen allopurinol (ZYLOPRIM) 300 MG tablet Take 300 mg by mouth daily.    . AZOR 10-40 MG per tablet TAKE 1 TABLET BY MOUTH DAILY 90 tablet 2  . B-D ULTRAFINE III SHORT PEN 31G X 8 MM MISC     . betamethasone valerate (VALISONE) 0.1 % cream Apply 1 application topically daily.     Marland Kitchen BRILINTA 60 MG TABS tablet Take 1 tablet (60 mg total) by mouth 2 (two) times daily. 60 tablet 5  . Cod Liver Oil w/Vit A & D CAPS Take 1 capsule by mouth daily.    . diphenhydrAMINE (BENADRYL) 25 mg capsule Take 25 mg by mouth every 6 (six) hours as  needed for itching.    . dorzolamide-timolol (COSOPT) 22.3-6.8 MG/ML ophthalmic solution Place 1 drop into both eyes 2 (two) times daily.    . fenofibrate (TRICOR) 145 MG tablet Take 145 mg by mouth daily.    . furosemide (LASIX) 40 MG tablet Take a 1/2 to 1 tablet a day as needed.    . Insulin Glargine (LANTUS SOLOSTAR [place]) Inject 55 Units into the skin at bedtime.    . metFORMIN (GLUCOPHAGE) 500 MG tablet Take 500 mg by mouth 3 (three) times daily.    . Methylcellulose, Laxative, (CITRUCEL PO) Take 1 tablet by mouth daily.    . metoprolol succinate (TOPROL-XL) 100 MG 24 hr tablet Take 1 tablet (100 mg total) by mouth daily. 30 tablet 5  . naproxen sodium (ANAPROX) 220 MG tablet Take 220 mg by mouth 2 (two) times daily with a meal.    . simvastatin (ZOCOR) 40 MG tablet Take 1 tablet (40 mg total) by mouth daily. 90 tablet 3  . terbinafine (LAMISIL) 250 MG tablet Take 250 mg by mouth daily.     .  travoprost, benzalkonium, (TRAVATAN) 0.004 % ophthalmic solution Place 1 drop into both eyes at bedtime.    . triamcinolone cream (KENALOG) 0.1 % Apply 1 application topically 3 (three) times daily.     Marland Kitchen VYTORIN 10-40 MG per tablet Take 1 tablet by mouth daily at 6 PM.      No current facility-administered medications for this visit.    Allergies  Allergen Reactions  . Accupril [Quinapril Hcl] Swelling    Mouth swelling    Review of Systems  Constitutional: Positive for fatigue.  HENT: Negative.   Eyes: Positive for visual disturbance.  Respiratory: Positive for apnea, cough and shortness of breath.   Cardiovascular: Positive for leg swelling. Negative for chest pain.  Gastrointestinal:       Difficulty swallowing  Endocrine: Negative.   Genitourinary: Positive for hematuria.       Kidney stones  Musculoskeletal: Positive for joint swelling and arthralgias.  Skin: Negative.   Allergic/Immunologic: Negative.   Neurological: Positive for numbness.       Hands and feet  Hematological: Negative.   Psychiatric/Behavioral: Negative.     BP 122/77 mmHg  Pulse 80  Resp 16  Ht 6' (1.829 m)  Wt 260 lb (117.935 kg)  BMI 35.25 kg/m2  SpO2 98% Physical Exam  Constitutional: He is oriented to person, place, and time.  Obese gentleman in no distress  HENT:  Head: Normocephalic and atraumatic.  Mouth/Throat: Oropharynx is clear and moist.  Eyes: EOM are normal.  Neck: Normal range of motion. Neck supple. No JVD present. No thyromegaly present.  Cardiovascular: Normal rate and normal heart sounds.   No murmur heard. Pulmonary/Chest: Effort normal and breath sounds normal. No respiratory distress. He has no wheezes. He exhibits no tenderness.  Abdominal: Soft. Bowel sounds are normal. He exhibits no mass. There is no tenderness.  obese  Musculoskeletal: Normal range of motion. He exhibits edema. He exhibits no tenderness.  Lymphadenopathy:    He has no cervical adenopathy.    Neurological: He is alert and oriented to person, place, and time. He has normal strength. No cranial nerve deficit or sensory deficit.  Skin: Skin is warm and dry.  Psychiatric: He has a normal mood and affect.     Diagnostic Tests:  I have reviewed the CT chest report from 02/18/2016 at [facility]. There is reportedly a 4.5 cm fusiform ascending aortic aneurysm.  Impression:  He has a 4.5 cm ascending aortic aneurysm of unknown duration. His BP is under adequate control and he is on a beta blocker. This does not require surgical treatment at this time. The current ACC/STS guidelines recommend surgical treatment if the aorta reaches 5.5 cm. I reviewed the scan with him and answered his questions.  Plan:  He will have a follow up CTA of the chest in one year and I will see him afterward to review the results.  Gaye Pollack, MD [facility] [phone]

## 2016-04-06 NOTE — Patient Instructions (Addendum)
Dashawn Bicknell  04/06/2016   Your procedure is scheduled on: 04-13-16  Report to [facility]  Entrance take [facility]  elevators to 3rd floor to  [place] at 930 AM.  Call this number if you have problems the morning of surgery [phone]   Remember: ONLY 1 PERSON MAY GO WITH YOU TO SHORT STAY TO GET  READY MORNING OF [place].  Do not eat food  :After Midnight Tuesday night, clear liquids all day Wednesday, nothiong by mouth after midnight Wednesday night.  FOLLOW ANY BOWEL PREP INSTRUCTIONS FROM DR Alinda Money    Bring cpap mask and tubing    Take these medicines the morning of surgery with A SIP OF WATER: ALLOPURINOL, EYE DROP, METOPROLOL SUCCINATE, FOLLOW ANY INSTRUCTIONS ABOUT STOPPING BRILINTA FROM DR Alinda Money DO NOT TAKE ANY DIABETIC MEDICATIONS DAY OF YOUR SURGERY, take 1/2 dose levemir insulin bedtime 04-12-16                               You may not have any metal on your body including hair pins and              piercings  Do not wear jewelry, make-up, lotions, powders or perfumes, deodorant             Do not wear nail polish.  Do not shave  48 hours prior to surgery.              Men may shave face and neck.   Do not bring valuables to the hospital. [place].  Contacts, dentures or bridgework may not be worn into surgery.  Leave suitcase in the car. After surgery it may be brought to your room.     Patients discharged the day of surgery will not be allowed to drive home.  Name and phone number of your driver:  Special Instructions: N/A              Please read over the following fact sheets you were given: _____________________________________________________________________             [facility] - Preparing for Surgery Before surgery, you can play an important role.  Because skin is not sterile, your skin needs to be as free of germs as possible.  You can reduce the number of germs on  your skin by washing with CHG (chlorahexidine gluconate) soap before surgery.  CHG is an antiseptic cleaner which kills germs and bonds with the skin to continue killing germs even after washing. Please DO NOT use if you have an allergy to CHG or antibacterial soaps.  If your skin becomes reddened/irritated stop using the CHG and inform your nurse when you arrive at Short Stay. Do not shave (including legs and underarms) for at least 48 hours prior to the first CHG shower.  You may shave your face/neck. Please follow these instructions carefully:  1.  Shower with CHG Soap the night before surgery and the  morning of Surgery.  2.  If you choose to wash your hair, wash your hair first as usual with your  normal  shampoo.  3.  After you shampoo, rinse your hair and body thoroughly to remove the  shampoo.  4.  Use CHG as you would any other liquid soap.  You can apply chg directly  to the skin and wash                       Gently with a scrungie or clean washcloth.  5.  Apply the CHG Soap to your body ONLY FROM THE NECK DOWN.   Do not use on face/ open                           Wound or open sores. Avoid contact with eyes, ears mouth and genitals (private parts).                       Wash face,  Genitals (private parts) with your normal soap.             6.  Wash thoroughly, paying special attention to the area where your surgery  will be performed.  7.  Thoroughly rinse your body with warm water from the neck down.  8.  DO NOT shower/wash with your normal soap after using and rinsing off  the CHG Soap.                9.  Pat yourself dry with a clean towel.            10.  Wear clean pajamas.            11.  Place clean sheets on your bed the night of your first shower and do not  sleep with pets. Day of Surgery : Do not apply any lotions/deodorants the morning of surgery.  Please wear clean clothes to the hospital/surgery center.  FAILURE TO FOLLOW THESE INSTRUCTIONS MAY  RESULT IN THE CANCELLATION OF YOUR SURGERY PATIENT SIGNATURE_________________________________  NURSE SIGNATURE__________________________________  ________________________________________________________________________  WHAT IS A BLOOD TRANSFUSION? Blood Transfusion Information  A transfusion is the replacement of blood or some of its parts. Blood is made up of multiple cells which provide different functions.  Red blood cells carry oxygen and are used for blood loss replacement.  White blood cells fight against infection.  Platelets control bleeding.  Plasma helps clot blood.  Other blood products are available for specialized needs, such as hemophilia or other clotting disorders. BEFORE THE TRANSFUSION  Who gives blood for transfusions?   Healthy volunteers who are fully evaluated to make sure their blood is safe. This is blood bank blood. Transfusion therapy is the safest it has ever been in the practice of medicine. Before blood is taken from a donor, a complete history is taken to make sure that person has no history of diseases nor engages in risky social behavior (examples are intravenous drug use or sexual activity with multiple partners). The donor's travel history is screened to minimize risk of transmitting infections, such as malaria. The donated blood is tested for signs of infectious diseases, such as HIV and hepatitis. The blood is then tested to be sure it is compatible with you in order to minimize the chance of a transfusion reaction. If you or a relative donates blood, this is often done in anticipation of surgery and is not appropriate for emergency situations. It takes many days to process the donated blood. RISKS AND COMPLICATIONS Although transfusion therapy is very safe and saves many lives, the main dangers of transfusion include:   Getting an infectious disease.  Developing a transfusion reaction. This  is an allergic reaction to something in the blood you were  given. Every precaution is taken to prevent this. The decision to have a blood transfusion has been considered carefully by your caregiver before blood is given. Blood is not given unless the benefits outweigh the risks. AFTER THE TRANSFUSION  Right after receiving a blood transfusion, you will usually feel much better and more energetic. This is especially true if your red blood cells have gotten low (anemic). The transfusion raises the level of the red blood cells which carry oxygen, and this usually causes an energy increase.  The nurse administering the transfusion will monitor you carefully for complications. HOME CARE INSTRUCTIONS  No special instructions are needed after a transfusion. You may find your energy is better. Speak with your caregiver about any limitations on activity for underlying diseases you may have. SEEK MEDICAL CARE IF:   Your condition is not improving after your transfusion.  You develop redness or irritation at the intravenous (IV) site. SEEK IMMEDIATE MEDICAL CARE IF:  Any of the following symptoms occur over the next 12 hours:  Shaking chills.  You have a temperature by mouth above 102 F (38.9 C), not controlled by medicine.  Chest, back, or muscle pain.  People around you feel you are not acting correctly or are confused.  Shortness of breath or difficulty breathing.  Dizziness and fainting.  You get a rash or develop hives.  You have a decrease in urine output.  Your urine turns a dark color or changes to pink, red, or brown. Any of the following symptoms occur over the next 10 days:  You have a temperature by mouth above 102 F (38.9 C), not controlled by medicine.  Shortness of breath.  Weakness after normal activity.  The white part of the eye turns yellow (jaundice).  You have a decrease in the amount of urine or are urinating less often.  Your urine turns a dark color or changes to pink, red, or brown. Document Released:  10/20/2000 Document Revised: 01/15/2012 Document Reviewed: 06/08/2008 ExitCare Patient Information 2014 ExitCare, [place].  _______________________________________________________________________   CLEAR LIQUID DIET   Foods Allowed                                                                     Foods Excluded  Coffee and tea, regular and decaf                             liquids that you cannot  Plain Jell-O in any flavor                                             see through such as: Fruit ices (not with fruit pulp)                                     milk, soups, orange juice  Iced Popsicles  All solid food Carbonated beverages, regular and diet                                    Cranberry, grape and apple juices Sports drinks like Gatorade Lightly seasoned clear broth or consume(fat free) Sugar, honey syrup  Sample Menu Breakfast                                Lunch                                     Supper Cranberry juice                    Beef broth                            Chicken broth Jell-O                                     Grape juice                           Apple juice Coffee or tea                        Jell-O                                      Popsicle                                                Coffee or tea                        Coffee or tea  _____________________________________________________________________

## 2016-04-06 NOTE — Progress Notes (Signed)
CHEST CT 01-18-16 [facility] ON CHART,  LOV DR BARTLE CARDIO/THORACIC 03-22-16 [facility],  LOV DR HARDING CARDIO 12-29-15 EKG 07-28-15 [facility]

## 2016-04-10 ENCOUNTER — Encounter (HOSPITAL_COMMUNITY): Payer: Self-pay

## 2016-04-10 ENCOUNTER — Other Ambulatory Visit: Payer: Self-pay | Admitting: Urology

## 2016-04-10 ENCOUNTER — Encounter (HOSPITAL_COMMUNITY)
Admission: RE | Admit: 2016-04-10 | Discharge: 2016-04-10 | Disposition: A | Discharge status: Home / Self Care | Payer: Medicare Other | Source: Ambulatory Visit | Attending: Urology | Admitting: Urology

## 2016-04-10 HISTORY — DX: Nausea with vomiting, unspecified: R11.2

## 2016-04-10 HISTORY — DX: Rash and other nonspecific skin eruption: R21

## 2016-04-10 HISTORY — DX: Other specified postprocedural states: Z98.890

## 2016-04-10 LAB — BASIC METABOLIC PANEL
ANION GAP: 9 (ref 5–15)
BUN: 18 mg/dL (ref 6–20)
CO2: 22 mmol/L (ref 22–32)
Calcium: 9.4 mg/dL (ref 8.9–10.3)
Chloride: 107 mmol/L (ref 101–111)
Creatinine, Ser: 1.13 mg/dL (ref 0.61–1.24)
Glucose, Bld: 170 mg/dL — ABNORMAL HIGH (ref 65–99)
POTASSIUM: 3.8 mmol/L (ref 3.5–5.1)
SODIUM: 138 mmol/L (ref 135–145)

## 2016-04-10 LAB — CBC
HEMATOCRIT: 39.7 % (ref 39.0–52.0)
HEMOGLOBIN: 13.1 g/dL (ref 13.0–17.0)
MCH: 28.5 pg (ref 26.0–34.0)
MCHC: 33 g/dL (ref 30.0–36.0)
MCV: 86.3 fL (ref 78.0–100.0)
Platelets: 208 10*3/uL (ref 150–400)
RBC: 4.6 MIL/uL (ref 4.22–5.81)
RDW: 14.8 % (ref 11.5–15.5)
WBC: 6.2 10*3/uL (ref 4.0–10.5)

## 2016-04-10 LAB — ABO/RH: ABO/RH(D): O POS

## 2016-04-11 LAB — HEMOGLOBIN A1C
Hgb A1c MFr Bld: 9.5 % — ABNORMAL HIGH (ref 4.8–5.6)
Mean Plasma Glucose: 226 mg/dL

## 2016-04-11 NOTE — Progress Notes (Signed)
FAXED HEAMGLOBIN A1C RESULTS TO DR Ruby AND POD NUMBER AN DLEFT MESSAGE WITH Ashley Akin

## 2016-04-11 NOTE — Progress Notes (Signed)
SPOKE WITH PT BY PHONE AWARE SURGERY TIME CHANGED TO 1045, ARRIVE 845 AM 04-13-16 [facility] SHORT STAY

## 2016-04-12 NOTE — H&P (Signed)
Chief Complaint Left renal neoplasms     History of Present Illness Mr. Jacob Berger is a 70 year old gentleman who presented to Dr. Junious Silk with gross hematuria in January 2017. He underwent a hematuria protocol CT scan demonstrating a distal 3 mm right ureteral stone and incidentally detected 4 separate left renal masses that appeared to demonstrate enhancement. Cystoscopy on 02/08/16 was unremarkable. An MRI of the kidneys was performed on 12/23/15 for further evaluation and confirmed 4 enhancing masses including a left mid/upper pole 2 cm mass, left posterior 2.8 cm renal mass, left anterior 2.3 cm renal mass, and an inferior 2.3 cm left renal mass. A CT of the chest on 01/20/16 was negative pulmonary metastases or other primary malignancy. However, he was found to have a 4.5 cm ascending thoracic aortic aneurysm. 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 It was not clear from the images nor the IR report as to which mass was biopsied. His imaging studies indicated no evidence of regional lymphadenopathy, contralateral renal masses except for multiple tiny cysts, no renal vein or IVC involvement, and no other evidence of metastatic disease in the abdomen.     His baseline Cr is 1.37 (eGFR 52 ml/min).    He does have a history of prostate cancer s/p a radical prostatectomy in 2004. His PSA from January 2017 was undetectable.    ** His PMH includes DM, gout, hyperlipidemia, hypertension, CAD s/p cardiac stent placement in 2011 (takes Brillinta), OSA managed with CPAP, and prostate cancer.    He denies any ongoing gross hematuria or flank pain. He states he is unsure whether he passed a stone but has not definitively noted that he passed a stone. He denies any unintentional weight loss, night sweats, or abdominal pain symptoms.   Past Medical History Problems  1. History of Arthritis 2. History of Coronary  artery disease (I25.10) 3. History of Gout (M10.9) 4. History of diabetes mellitus (Z86.39) 5. History of glaucoma (Z86.69) 6. History of hypercholesterolemia (Z86.39) 7. History of hypertension (Z86.79) 8. History of Murmur (R01.1) 9. History of Obstructive sleep apnea, adult (G47.33) 10. Personal history of prostate cancer (Z85.46)  Surgical History Problems  1. History of Cath Stent Placement 2. History of Prostatect Retropubic Radical W/ Bilat Pelv Lymphadenectomy  Current Meds 1. Allopurinol 300 MG Oral Tablet;  Therapy: (Recorded:15Nov2012) to Recorded 2. Aspirin 81 MG TABS;  Therapy: (Recorded:22Aug2013) to Recorded 3. Azor 5-40 MG Oral Tablet;  Therapy: 20Aug2013 to Recorded 4. Brilinta 60 MG Oral Tablet;  Therapy: (Recorded:19Jan2017) to Recorded 5. Citrucel TABS;  Therapy: (Recorded:15Nov2012) to Recorded 6. Cod Liver OIL;  Therapy: (Recorded:28Dec2015) to Recorded 7. Combigan 0.2-0.5 % Ophthalmic Solution;  Therapy: [phone] to Recorded 8. CPAP;  Therapy: (Recorded:28Dec2015) to Recorded 9. LamISIL 250 MG Oral Tablet;  Therapy: (Recorded:19Jan2017) to Recorded 10. Lantus SOLN;   Therapy: (Recorded:15Nov2012) to Recorded 11. Lasix TABS;   Therapy: (Recorded:19Jan2017) to Recorded 12. MetFORMIN HCl - 500 MG Oral Tablet;   Therapy: (Recorded:15Nov2012) to Recorded 13. Metoprolol Succinate ER 100 MG Oral Tablet Extended Release 24 Hour;   Therapy: (Recorded:28Dec2015) to Recorded 14. [place];   Therapy: (Recorded:28Dec2015) to Recorded 15. Simvastatin 80 MG Oral Tablet;   Therapy: (Recorded:15Nov2012) to Recorded 16. Tanzeum 50 MG Subcutaneous Pen-injector;   Therapy: (Recorded:19Jan2017) to Recorded 17. Travatan Z 0.004 % Ophthalmic Solution;   Therapy: [phone] to Recorded 18. Tricor TABS;   Therapy: (Recorded:19Jan2017) to Recorded 19. Vytorin 10-20  MG Oral Tablet;   Therapy: (Recorded:19Jan2017) to Recorded  Allergies Medication  1. Accupril  TABS  Family History Problems  1. Family history of Family Health Status Number Of Children   2 sons/ 1 daughter 2. Denied: Family history of kidney cancer 3. Family history of Father Deceased At Age ___ 36. Family history of Mother Deceased At Age ___  Social History Problems    Alcohol Use (History)   1-2   Caffeine Use   1   Former smoker [phone])   Marital History - Currently Married   Tobacco use (Z72.0)   1 pk a wk for 3-4 yrs/ quit 20 yrs ago  Review of Systems Genitourinary, constitutional, skin, eye, otolaryngeal, hematologic/lymphatic, cardiovascular, pulmonary, endocrine, musculoskeletal, gastrointestinal, neurological and psychiatric system(s) were reviewed and pertinent findings if present are noted and are otherwise negative.  Hematologic/Lymphatic: no swollen glands.  Cardiovascular: no chest pain.  Respiratory: no shortness of breath during exertion.    Physical Exam Constitutional: Well nourished and well developed . No acute distress.  ENT:. The ears and nose are normal in appearance.  Neck: The appearance of the neck is normal and no neck mass is present.  Pulmonary: No respiratory distress, normal respiratory rhythm and effort and clear bilateral breath sounds.  Cardiovascular: Heart rate and rhythm are normal . No peripheral edema.  Abdomen: lower midline incision site(s) well healed. The abdomen is soft and nontender. No masses are palpated. No CVA tenderness. No hernias are palpable. No hepatosplenomegaly noted.  Lymphatics: The supraclavicular, femoral and inguinal nodes are not enlarged or tender.  Skin: Normal skin turgor, no visible rash and no visible skin lesions.  Neuro/Psych:. Mood and affect are appropriate.     Assessment Assessed  1. Renal cell carcinoma, left (C64.2)    Discussion/Summary 1. Renal cell carcinoma: He will undergo a left laparoscopic radical nephrectomy.

## 2016-04-13 ENCOUNTER — Inpatient Hospital Stay (HOSPITAL_COMMUNITY): Payer: Medicare Other | Admitting: Certified Registered Nurse Anesthetist

## 2016-04-13 ENCOUNTER — Encounter (HOSPITAL_COMMUNITY)
Admission: RE | Disposition: A | Discharge status: Home / Self Care | Payer: Self-pay | Source: Ambulatory Visit | Attending: Urology

## 2016-04-13 ENCOUNTER — Encounter (HOSPITAL_COMMUNITY): Payer: Self-pay | Admitting: *Deleted

## 2016-04-13 ENCOUNTER — Inpatient Hospital Stay (HOSPITAL_COMMUNITY)
Admission: RE | Admit: 2016-04-13 | Discharge: 2016-04-16 | DRG: 658 | Disposition: A | Discharge status: Home / Self Care | Payer: Medicare Other | Source: Ambulatory Visit | Attending: Urology | Admitting: Urology

## 2016-04-13 DIAGNOSIS — C642 Malignant neoplasm of left kidney, except renal pelvis: Principal | ICD-10-CM | POA: Diagnosis present

## 2016-04-13 DIAGNOSIS — I10 Essential (primary) hypertension: Secondary | ICD-10-CM | POA: Diagnosis present

## 2016-04-13 DIAGNOSIS — G4733 Obstructive sleep apnea (adult) (pediatric): Secondary | ICD-10-CM | POA: Diagnosis present

## 2016-04-13 DIAGNOSIS — E7800 Pure hypercholesterolemia, unspecified: Secondary | ICD-10-CM | POA: Diagnosis present

## 2016-04-13 DIAGNOSIS — H409 Unspecified glaucoma: Secondary | ICD-10-CM | POA: Diagnosis present

## 2016-04-13 DIAGNOSIS — Z955 Presence of coronary angioplasty implant and graft: Secondary | ICD-10-CM

## 2016-04-13 DIAGNOSIS — I2510 Atherosclerotic heart disease of native coronary artery without angina pectoris: Secondary | ICD-10-CM | POA: Diagnosis present

## 2016-04-13 DIAGNOSIS — Z8546 Personal history of malignant neoplasm of prostate: Secondary | ICD-10-CM | POA: Diagnosis not present

## 2016-04-13 DIAGNOSIS — E785 Hyperlipidemia, unspecified: Secondary | ICD-10-CM | POA: Diagnosis present

## 2016-04-13 DIAGNOSIS — Z794 Long term (current) use of insulin: Secondary | ICD-10-CM

## 2016-04-13 DIAGNOSIS — Z87891 Personal history of nicotine dependence: Secondary | ICD-10-CM

## 2016-04-13 DIAGNOSIS — M109 Gout, unspecified: Secondary | ICD-10-CM | POA: Diagnosis present

## 2016-04-13 DIAGNOSIS — I712 Thoracic aortic aneurysm, without rupture: Secondary | ICD-10-CM | POA: Diagnosis present

## 2016-04-13 DIAGNOSIS — Z7982 Long term (current) use of aspirin: Secondary | ICD-10-CM | POA: Diagnosis not present

## 2016-04-13 DIAGNOSIS — Z79899 Other long term (current) drug therapy: Secondary | ICD-10-CM

## 2016-04-13 DIAGNOSIS — E119 Type 2 diabetes mellitus without complications: Secondary | ICD-10-CM | POA: Diagnosis present

## 2016-04-13 DIAGNOSIS — Z85528 Personal history of other malignant neoplasm of kidney: Secondary | ICD-10-CM | POA: Diagnosis present

## 2016-04-13 HISTORY — PX: LAPAROSCOPIC NEPHRECTOMY: SHX1930

## 2016-04-13 LAB — TYPE AND SCREEN
ABO/RH(D): O POS
ANTIBODY SCREEN: NEGATIVE

## 2016-04-13 LAB — GLUCOSE, CAPILLARY
GLUCOSE-CAPILLARY: 166 mg/dL — AB (ref 65–99)
Glucose-Capillary: 181 mg/dL — ABNORMAL HIGH (ref 65–99)
Glucose-Capillary: 204 mg/dL — ABNORMAL HIGH (ref 65–99)
Glucose-Capillary: 244 mg/dL — ABNORMAL HIGH (ref 65–99)

## 2016-04-13 LAB — BASIC METABOLIC PANEL
Anion gap: 10 (ref 5–15)
BUN: 18 mg/dL (ref 6–20)
CO2: 21 mmol/L — ABNORMAL LOW (ref 22–32)
Calcium: 8.9 mg/dL (ref 8.9–10.3)
Chloride: 108 mmol/L (ref 101–111)
Creatinine, Ser: 1.4 mg/dL — ABNORMAL HIGH (ref 0.61–1.24)
GFR calc Af Amer: 57 mL/min — ABNORMAL LOW (ref >60)
GFR calc non Af Amer: 49 mL/min — ABNORMAL LOW (ref >60)
Glucose, Bld: 215 mg/dL — ABNORMAL HIGH (ref 65–99)
Potassium: 4.2 mmol/L (ref 3.5–5.1)
Sodium: 139 mmol/L (ref 135–145)

## 2016-04-13 LAB — HEMOGLOBIN AND HEMATOCRIT, BLOOD
HCT: 38.3 % — ABNORMAL LOW (ref 39.0–52.0)
Hemoglobin: 13.2 g/dL (ref 13.0–17.0)

## 2016-04-13 SURGERY — NEPHRECTOMY, RADICAL, LAPAROSCOPIC, ADULT
Anesthesia: General | Laterality: Left

## 2016-04-13 MED ORDER — DIPHENHYDRAMINE HCL 50 MG/ML IJ SOLN: 12.5000 mg | Freq: Four times a day (QID) | INTRAMUSCULAR | Status: DC | PRN

## 2016-04-13 MED ORDER — IRBESARTAN 300 MG PO TABS
300.0000 mg | ORAL_TABLET | Freq: Every day | ORAL | Status: DC
Start: 2016-04-14 — End: 2016-04-16
  Administered 2016-04-14 – 2016-04-16 (×3): 300 mg via ORAL
  Filled 2016-04-13 (×3): qty 1

## 2016-04-13 MED ORDER — BUPIVACAINE LIPOSOME 1.3 % IJ SUSP
20.0000 mL | Freq: Once | INTRAMUSCULAR | Status: DC
  Filled 2016-04-13: qty 20

## 2016-04-13 MED ORDER — BUPIVACAINE LIPOSOME 1.3 % IJ SUSP
INTRAMUSCULAR | Status: DC | PRN
  Administered 2016-04-13: 30 mL

## 2016-04-13 MED ORDER — SUGAMMADEX SODIUM 200 MG/2ML IV SOLN
INTRAVENOUS | Status: DC | PRN
  Administered 2016-04-13: 200 mg via INTRAVENOUS

## 2016-04-13 MED ORDER — HYDROMORPHONE HCL 2 MG/ML IJ SOLN
INTRAMUSCULAR | Status: AC
  Filled 2016-04-13: qty 1

## 2016-04-13 MED ORDER — EZETIMIBE-SIMVASTATIN 10-40 MG PO TABS
1.0000 | ORAL_TABLET | Freq: Every day | ORAL | Status: DC
  Administered 2016-04-13 – 2016-04-15 (×3): 1 via ORAL
  Filled 2016-04-13 (×4): qty 1

## 2016-04-13 MED ORDER — CEFAZOLIN SODIUM 1-5 GM-% IV SOLN
1.0000 g | Freq: Three times a day (TID) | INTRAVENOUS | Status: AC
  Administered 2016-04-13 – 2016-04-14 (×2): 1 g via INTRAVENOUS
  Filled 2016-04-13 (×2): qty 50

## 2016-04-13 MED ORDER — METOCLOPRAMIDE HCL 5 MG/ML IJ SOLN
INTRAMUSCULAR | Status: AC
  Filled 2016-04-13: qty 2

## 2016-04-13 MED ORDER — MIDAZOLAM HCL 2 MG/2ML IJ SOLN
INTRAMUSCULAR | Status: AC
  Filled 2016-04-13: qty 2

## 2016-04-13 MED ORDER — METOPROLOL SUCCINATE ER 100 MG PO TB24
100.0000 mg | ORAL_TABLET | Freq: Every day | ORAL | Status: DC
  Administered 2016-04-14 – 2016-04-16 (×3): 100 mg via ORAL
  Filled 2016-04-13 (×3): qty 1

## 2016-04-13 MED ORDER — SUGAMMADEX SODIUM 200 MG/2ML IV SOLN
INTRAVENOUS | Status: AC
  Filled 2016-04-13: qty 2

## 2016-04-13 MED ORDER — METOCLOPRAMIDE HCL 5 MG/ML IJ SOLN
INTRAMUSCULAR | Status: DC | PRN
  Administered 2016-04-13: 10 mg via INTRAVENOUS

## 2016-04-13 MED ORDER — FENTANYL CITRATE (PF) 100 MCG/2ML IJ SOLN
INTRAMUSCULAR | Status: AC
  Filled 2016-04-13: qty 2

## 2016-04-13 MED ORDER — ONDANSETRON HCL 4 MG/2ML IJ SOLN: 4.0000 mg | Freq: Once | INTRAMUSCULAR | Status: DC | PRN

## 2016-04-13 MED ORDER — LIDOCAINE HCL (CARDIAC) 20 MG/ML IV SOLN
INTRAVENOUS | Status: AC
  Filled 2016-04-13: qty 5

## 2016-04-13 MED ORDER — PROPOFOL 10 MG/ML IV BOLUS
INTRAVENOUS | Status: DC | PRN
  Administered 2016-04-13: 130 mg via INTRAVENOUS

## 2016-04-13 MED ORDER — ONDANSETRON HCL 4 MG/2ML IJ SOLN
INTRAMUSCULAR | Status: AC
  Filled 2016-04-13: qty 2

## 2016-04-13 MED ORDER — CEFAZOLIN SODIUM-DEXTROSE 2-4 GM/100ML-% IV SOLN
INTRAVENOUS | Status: AC
  Filled 2016-04-13: qty 100

## 2016-04-13 MED ORDER — LIDOCAINE HCL (CARDIAC) 20 MG/ML IV SOLN
INTRAVENOUS | Status: DC | PRN
  Administered 2016-04-13: 100 mg via INTRAVENOUS

## 2016-04-13 MED ORDER — TRIAMCINOLONE ACETONIDE 0.1 % EX CREA: 1.0000 "application " | TOPICAL_CREAM | Freq: Three times a day (TID) | CUTANEOUS | Status: DC | PRN

## 2016-04-13 MED ORDER — CEFAZOLIN SODIUM-DEXTROSE 2-4 GM/100ML-% IV SOLN
2.0000 g | INTRAVENOUS | Status: AC
  Administered 2016-04-13: 2 g via INTRAVENOUS
  Filled 2016-04-13: qty 100

## 2016-04-13 MED ORDER — ONDANSETRON HCL 4 MG/2ML IJ SOLN
4.0000 mg | INTRAMUSCULAR | Status: DC | PRN
  Administered 2016-04-14: 4 mg via INTRAVENOUS
  Filled 2016-04-13: qty 2

## 2016-04-13 MED ORDER — DOCUSATE SODIUM 100 MG PO CAPS
100.0000 mg | ORAL_CAPSULE | Freq: Two times a day (BID) | ORAL | Status: DC
Start: 2016-04-13 — End: 2016-04-16
  Administered 2016-04-13 – 2016-04-16 (×6): 100 mg via ORAL
  Filled 2016-04-13 (×6): qty 1

## 2016-04-13 MED ORDER — HYDROCODONE-ACETAMINOPHEN 5-325 MG PO TABS: 1.0000 | ORAL_TABLET | Freq: Four times a day (QID) | ORAL | Status: DC | PRN

## 2016-04-13 MED ORDER — AMLODIPINE-OLMESARTAN 10-40 MG PO TABS: 1.0000 | ORAL_TABLET | Freq: Every day | ORAL | Status: DC

## 2016-04-13 MED ORDER — DORZOLAMIDE HCL-TIMOLOL MAL 2-0.5 % OP SOLN
1.0000 [drp] | Freq: Two times a day (BID) | OPHTHALMIC | Status: DC
  Administered 2016-04-13 – 2016-04-16 (×6): 1 [drp] via OPHTHALMIC
  Filled 2016-04-13: qty 10

## 2016-04-13 MED ORDER — SODIUM CHLORIDE 0.45 % IV SOLN
INTRAVENOUS | Status: DC
  Administered 2016-04-13 – 2016-04-14 (×2): via INTRAVENOUS

## 2016-04-13 MED ORDER — INSULIN ASPART 100 UNIT/ML ~~LOC~~ SOLN
0.0000 [IU] | SUBCUTANEOUS | Status: DC
  Administered 2016-04-13 – 2016-04-14 (×3): 5 [IU] via SUBCUTANEOUS
  Administered 2016-04-14 (×2): 3 [IU] via SUBCUTANEOUS
  Administered 2016-04-14: 5 [IU] via SUBCUTANEOUS
  Administered 2016-04-14: 8 [IU] via SUBCUTANEOUS
  Administered 2016-04-14: 3 [IU] via SUBCUTANEOUS
  Administered 2016-04-15: 5 [IU] via SUBCUTANEOUS
  Administered 2016-04-15: 8 [IU] via SUBCUTANEOUS
  Administered 2016-04-15: 5 [IU] via SUBCUTANEOUS
  Administered 2016-04-15: 8 [IU] via SUBCUTANEOUS
  Administered 2016-04-16: 5 [IU] via SUBCUTANEOUS

## 2016-04-13 MED ORDER — TRAVOPROST (BAK FREE) 0.004 % OP SOLN
1.0000 [drp] | Freq: Every day | OPHTHALMIC | Status: DC
  Filled 2016-04-13: qty 2.5

## 2016-04-13 MED ORDER — FENOFIBRATE 160 MG PO TABS
160.0000 mg | ORAL_TABLET | Freq: Every day | ORAL | Status: DC
  Administered 2016-04-14 – 2016-04-16 (×3): 160 mg via ORAL
  Filled 2016-04-13 (×3): qty 1

## 2016-04-13 MED ORDER — FENTANYL CITRATE (PF) 100 MCG/2ML IJ SOLN
INTRAMUSCULAR | Status: DC | PRN
  Administered 2016-04-13: 150 ug via INTRAVENOUS
  Administered 2016-04-13: 100 ug via INTRAVENOUS

## 2016-04-13 MED ORDER — FENTANYL CITRATE (PF) 250 MCG/5ML IJ SOLN
INTRAMUSCULAR | Status: AC
Start: 2016-04-13 — End: 2016-04-13
  Filled 2016-04-13: qty 5

## 2016-04-13 MED ORDER — DEXAMETHASONE SODIUM PHOSPHATE 10 MG/ML IJ SOLN
INTRAMUSCULAR | Status: DC | PRN
  Administered 2016-04-13: 5 mg via INTRAVENOUS

## 2016-04-13 MED ORDER — HYDROMORPHONE HCL 1 MG/ML IJ SOLN
INTRAMUSCULAR | Status: DC | PRN
  Administered 2016-04-13: .2 mg via INTRAVENOUS
  Administered 2016-04-13 (×4): .4 mg via INTRAVENOUS
  Administered 2016-04-13: .2 mg via INTRAVENOUS

## 2016-04-13 MED ORDER — LATANOPROST 0.005 % OP SOLN
1.0000 [drp] | Freq: Every day | OPHTHALMIC | Status: DC
  Administered 2016-04-13 – 2016-04-15 (×3): 1 [drp] via OPHTHALMIC
  Filled 2016-04-13: qty 2.5

## 2016-04-13 MED ORDER — ALLOPURINOL 300 MG PO TABS
300.0000 mg | ORAL_TABLET | Freq: Every day | ORAL | Status: DC
  Administered 2016-04-14 – 2016-04-16 (×3): 300 mg via ORAL
  Filled 2016-04-13 (×3): qty 1

## 2016-04-13 MED ORDER — AMLODIPINE BESYLATE 5 MG PO TABS
5.0000 mg | ORAL_TABLET | Freq: Every day | ORAL | Status: DC
  Administered 2016-04-14 – 2016-04-16 (×3): 5 mg via ORAL
  Filled 2016-04-13 (×3): qty 1

## 2016-04-13 MED ORDER — PROPOFOL 10 MG/ML IV BOLUS
INTRAVENOUS | Status: AC
  Filled 2016-04-13: qty 20

## 2016-04-13 MED ORDER — MEPERIDINE HCL 50 MG/ML IJ SOLN: 6.2500 mg | INTRAMUSCULAR | Status: DC | PRN

## 2016-04-13 MED ORDER — LACTATED RINGERS IV SOLN
INTRAVENOUS | Status: DC
Start: 2016-04-13 — End: 2016-04-13
  Administered 2016-04-13 (×2): via INTRAVENOUS

## 2016-04-13 MED ORDER — ROCURONIUM BROMIDE 100 MG/10ML IV SOLN
INTRAVENOUS | Status: DC | PRN
  Administered 2016-04-13: 50 mg via INTRAVENOUS
  Administered 2016-04-13: 10 mg via INTRAVENOUS

## 2016-04-13 MED ORDER — ROCURONIUM BROMIDE 100 MG/10ML IV SOLN
INTRAVENOUS | Status: AC
  Filled 2016-04-13: qty 1

## 2016-04-13 MED ORDER — DEXAMETHASONE SODIUM PHOSPHATE 10 MG/ML IJ SOLN
INTRAMUSCULAR | Status: AC
  Filled 2016-04-13: qty 1

## 2016-04-13 MED ORDER — HYDRALAZINE HCL 20 MG/ML IJ SOLN
INTRAMUSCULAR | Status: DC | PRN
  Administered 2016-04-13: 2 mg via INTRAVENOUS

## 2016-04-13 MED ORDER — SODIUM CHLORIDE 0.9 % IJ SOLN
INTRAMUSCULAR | Status: AC
  Filled 2016-04-13: qty 20

## 2016-04-13 MED ORDER — HYDROMORPHONE HCL 1 MG/ML IJ SOLN: 0.2500 mg | INTRAMUSCULAR | Status: DC | PRN

## 2016-04-13 MED ORDER — FUROSEMIDE 20 MG PO TABS: 20.0000 mg | ORAL_TABLET | Freq: Every day | ORAL | Status: DC | PRN

## 2016-04-13 MED ORDER — HYDROMORPHONE HCL 1 MG/ML IJ SOLN
0.5000 mg | INTRAMUSCULAR | Status: DC | PRN
  Administered 2016-04-13 (×2): 1 mg via INTRAVENOUS
  Filled 2016-04-13 (×2): qty 1

## 2016-04-13 MED ORDER — ACETAMINOPHEN 10 MG/ML IV SOLN
1000.0000 mg | Freq: Four times a day (QID) | INTRAVENOUS | Status: DC
  Administered 2016-04-13 – 2016-04-14 (×3): 1000 mg via INTRAVENOUS
  Filled 2016-04-13 (×4): qty 100

## 2016-04-13 MED ORDER — MIDAZOLAM HCL 5 MG/5ML IJ SOLN
INTRAMUSCULAR | Status: DC | PRN
  Administered 2016-04-13: 2 mg via INTRAVENOUS

## 2016-04-13 MED ORDER — ONDANSETRON HCL 4 MG/2ML IJ SOLN
INTRAMUSCULAR | Status: DC | PRN
  Administered 2016-04-13 (×2): 4 mg via INTRAVENOUS

## 2016-04-13 MED ORDER — DIPHENHYDRAMINE HCL 12.5 MG/5ML PO ELIX: 12.5000 mg | ORAL_SOLUTION | Freq: Four times a day (QID) | ORAL | Status: DC | PRN

## 2016-04-13 SURGICAL SUPPLY — 60 items
BAG SPEC RTRVL LRG 6X4 10 (ENDOMECHANICALS) ×1
BAG SPEC THK2 15X12 ZIP CLS (MISCELLANEOUS) ×1
BAG ZIPLOCK 12X15 (MISCELLANEOUS) ×3 IMPLANT
BLADE EXTENDED COATED 6.5IN (ELECTRODE) IMPLANT
BLADE SURG SZ10 CARB STEEL (BLADE) IMPLANT
CATH FOLEY 3WAY  5CC 18FR (CATHETERS) ×2
CATH FOLEY 3WAY 5CC 18FR (CATHETERS) ×1 IMPLANT
CHLORAPREP W/TINT 26ML (MISCELLANEOUS) ×3 IMPLANT
CLIP LIGATING HEM O LOK PURPLE (MISCELLANEOUS) ×7 IMPLANT
CLIP LIGATING HEMO LOK XL GOLD (MISCELLANEOUS) ×2 IMPLANT
CLIP LIGATING HEMO O LOK GREEN (MISCELLANEOUS) ×3 IMPLANT
COVER SURGICAL LIGHT HANDLE (MISCELLANEOUS) ×3 IMPLANT
CUTTER FLEX LINEAR 45M (STAPLE) ×2 IMPLANT
DRAIN CHANNEL 10F 3/8 F FF (DRAIN) IMPLANT
DRAPE INCISE IOBAN 66X45 STRL (DRAPES) ×3 IMPLANT
DRAPE LAPAROSCOPIC ABDOMINAL (DRAPES) ×3 IMPLANT
DRAPE WARM FLUID 44X44 (DRAPE) IMPLANT
DRSG TEGADERM 4X4.75 (GAUZE/BANDAGES/DRESSINGS) ×1 IMPLANT
ELECT PENCIL ROCKER SW 15FT (MISCELLANEOUS) ×3 IMPLANT
ELECT REM PT RETURN 9FT ADLT (ELECTROSURGICAL) ×3
ELECTRODE REM PT RTRN 9FT ADLT (ELECTROSURGICAL) ×1 IMPLANT
EVACUATOR SILICONE 100CC (DRAIN) IMPLANT
GLOVE BIO SURGEON STRL SZ 6.5 (GLOVE) ×2 IMPLANT
GLOVE BIO SURGEONS STRL SZ 6.5 (GLOVE) ×1
GLOVE BIOGEL M STRL SZ7.5 (GLOVE) ×3 IMPLANT
GOWN STRL REUS W/TWL LRG LVL3 (GOWN DISPOSABLE) ×6 IMPLANT
HEMOSTAT SURGICEL 4X8 (HEMOSTASIS) IMPLANT
KIT BASIN OR (CUSTOM PROCEDURE TRAY) ×3 IMPLANT
LIQUID BAND (GAUZE/BANDAGES/DRESSINGS) ×3 IMPLANT
POUCH ENDO CATCH II 15MM (MISCELLANEOUS) ×3 IMPLANT
POUCH SPECIMEN RETRIEVAL 10MM (ENDOMECHANICALS) ×3 IMPLANT
RELOAD 45 VASCULAR/THIN (ENDOMECHANICALS) ×3 IMPLANT
RELOAD STAPLE 45 2.5 WHT GRN (ENDOMECHANICALS) IMPLANT
RETRACTOR LAPSCP 12X46 CVD (ENDOMECHANICALS) IMPLANT
RTRCTR LAPSCP 12X46 CVD (ENDOMECHANICALS)
SCISSORS LAP 5X35 DISP (ENDOMECHANICALS) IMPLANT
SET IRRIG TUBING LAPAROSCOPIC (IRRIGATION / IRRIGATOR) ×3 IMPLANT
SHEARS HARMONIC ACE PLUS 36CM (ENDOMECHANICALS) ×3 IMPLANT
SPONGE LAP 18X18 X RAY DECT (DISPOSABLE) ×6 IMPLANT
SPONGE SURGIFOAM ABS GEL 100 (HEMOSTASIS) IMPLANT
SURGIFLO W/THROMBIN 8M KIT (HEMOSTASIS) IMPLANT
SUT CHROMIC 2 0 SH (SUTURE) ×3 IMPLANT
SUT ETHILON 3 0 PS 1 (SUTURE) IMPLANT
SUT MNCRL AB 4-0 PS2 18 (SUTURE) ×6 IMPLANT
SUT PDS AB 1 CTX 36 (SUTURE) ×6 IMPLANT
SUT VIC AB 2-0 SH 27 (SUTURE)
SUT VIC AB 2-0 SH 27X BRD (SUTURE) IMPLANT
SUT VICRYL 0 UR6 27IN ABS (SUTURE) ×12 IMPLANT
TOWEL OR 17X26 10 PK STRL BLUE (TOWEL DISPOSABLE) ×6 IMPLANT
TRAY FOLEY W/METER SILVER 14FR (SET/KITS/TRAYS/PACK) ×3 IMPLANT
TRAY FOLEY W/METER SILVER 16FR (SET/KITS/TRAYS/PACK) ×3 IMPLANT
TRAY LAPAROSCOPIC (CUSTOM PROCEDURE TRAY) ×3 IMPLANT
TROCAR BLADELESS OPT 5 100 (ENDOMECHANICALS) ×3 IMPLANT
TROCAR BLADELESS OPT 5 75 (ENDOMECHANICALS) ×3 IMPLANT
TROCAR XCEL 12X100 BLDLESS (ENDOMECHANICALS) ×3 IMPLANT
TROCAR XCEL BLUNT TIP 100MML (ENDOMECHANICALS) ×3 IMPLANT
TUBING INSUF HEATED (TUBING) ×3 IMPLANT
TUBING TUR DISP (UROLOGICAL SUPPLIES) ×3 IMPLANT
URINEMETER 200ML W/220 (MISCELLANEOUS) ×3 IMPLANT
YANKAUER SUCT BULB TIP 10FT TU (MISCELLANEOUS) ×3 IMPLANT

## 2016-04-13 NOTE — Progress Notes (Signed)
Pt has refused CPAP for the night.  Pt states that he wishes to wear only the [place], Pt to notify RT if he changes his mind.  RT to monitor and assess as needed.

## 2016-04-13 NOTE — Anesthesia Postprocedure Evaluation (Signed)
Anesthesia Post Note  Patient: Jacob Berger  Procedure(s) Performed: Procedure(s) (LRB): LAPAROSCOPIC RADICAL LEFT NEPHRECTOMY (Left)  Patient location during evaluation: PACU Anesthesia Type: General Level of consciousness: awake and alert Pain management: pain level controlled Vital Signs Assessment: post-procedure vital signs reviewed and stable Respiratory status: spontaneous breathing, nonlabored ventilation, respiratory function stable and patient connected to nasal cannula oxygen Cardiovascular status: blood pressure returned to baseline and stable Postop Assessment: no signs of nausea or vomiting Anesthetic complications: no    Last Vitals:  Filed Vitals:   04/13/16 1315 04/13/16 1330  BP: 167/97 139/65  Pulse: 69 68  Temp:    Resp: 19 8    Last Pain:  Filed Vitals:   04/13/16 1411  PainSc: Asleep                 TRUE Garciamartinez L

## 2016-04-13 NOTE — Op Note (Signed)
Preoperative diagnosis: Left renal cell carcinoma  Postoperative diagnosis: Left renal cell carcinoma  Procedure: 1.  Left laparoscopic radical nephrectomy  Surgeon: Pryor Curia. M.D.  Assistant(s): Debbrah Alar, PA-C  Anesthesia: General  Complications: None  EBL: 25 mL  IVF:  1400 mL crystalloid  Specimens: 1. Left kidney  Disposition of specimens: Pathology  Indication: Sho Diener is a 70 y.o. patient with multiple left renal tumors suspicious for malignancy.  He underwent percutaneous biopsy confirming renal cell carcinoma.  After a thorough review of the management options for their renal masses, they elected to proceed with surgical treatment and the above procedure.  We have discussed the potential benefits and risks of the procedure, side effects of the proposed treatment, the likelihood of the patient achieving the goals of the procedure, and any potential problems that might occur during the procedure or recuperation. Informed consent has been obtained.  Description of procedure:  The patient was taken to the operating room and a general anesthetic was administered. The patient was given preoperative antibiotics, placed in the left modified flank position, and prepped and draped in the usual sterile fashion. Next a preoperative timeout was performed.  A site was selected near the umbilicus for placement of the camera port. This was placed using a standard open Hassan technique which allowed entry into the peritoneal cavity under direct vision and without difficulty. A 12 mm Hassan cannula was placed and a pneumoperitoneum established. The camera was then used to inspect the abdomen and there was no evidence of any intra-abdominal injuries or other abnormalities. The remaining abdominal ports were then placed. A 12 mm port was placed in the left lower quadrant and a 5 mm port was placed in the left upper quadrant.  All ports were placed under direct vision without  difficulty.  Utilizing the harmonic scalpel, the white line of Toldt was incised allowing the colon to be reflected medially and the plane between the mesocolon and the anterior layer of Gerota's fascia to be developed and the kidney exposed.  The ureter and gonadal vein were identified inferiorly and the ureter was lifted anteriorly off the psoas muscle.  Dissection proceeded superiorly along the gonadal vein until the renal vein was identified.  The renal hilum was then carefully isolated with a combination of blunt and sharp dissectiong allowing the renal arterial and venous structures to be separated and isolated.  There was an early branching renal artery and two renal veins identified.  The renal arteries were isolated and ligated with multiple Weck clips and subsequently divided.  The anterior renal vein was smaller and was also ligated with Weck clips and divided. The main renal vein was then isolated and also ligated and divided with a 45 mm Flex ETS stapler.  Gerota's fascia was intentionally entered superiorly and the space between the adrenal gland and the kidney was developed allowing the adrenal gland to be spared.  The splenorenal ligaments were divided with the harmonic scalpel.  The lateral and posterior attachements to the kidney were then divided.  The ureter was ligated with Weck clips and divided allowing the specimen to be freed from all surrounding structures.  The kidney specimen was then placed into a 15 mm Endocatch II retrieval bag.  The renal hilum, liver, adrenal bed and gonadal vein areas were each inspected and hemostasis was ensured with the pneomperitoneal pressures lowered.  The 12 mm lower quadrant port was then closed with a 0-vicryl suture placed laparoscopically to close the fascia  of this incision. All remaining ports were removed under direct vision.  The kidney specimen was removed intact within the retrieval bag via the camera port site after this incision was  extended slightly. This fascial opening was then closed with two #1 PDS sutures.    All incisions were injected with local anesthetic and reapproximated at the skin with 4-0 monocryl sutures.  Dermabond was applied to the skin. The patient tolerated the procedure well and without complications and was transferred to the recovery unit in satisfactory condition.   Pryor Curia MD

## 2016-04-13 NOTE — Anesthesia Preprocedure Evaluation (Signed)
Anesthesia Evaluation  Patient identified by MRN, date of birth, ID band Patient awake    Reviewed: Allergy & Precautions, NPO status , Patient's Chart, lab work & pertinent test results  History of Anesthesia Complications (+) PONV  Airway Mallampati: I  TM Distance: >3 FB Neck ROM: Full    Dental   Pulmonary sleep apnea , former smoker,    Pulmonary exam normal        Cardiovascular hypertension, Pt. on medications + CAD  Normal cardiovascular exam     Neuro/Psych    GI/Hepatic   Endo/Other  diabetes, Type 2, Insulin Dependent  Renal/GU      Musculoskeletal   Abdominal   Peds  Hematology   Anesthesia Other Findings   Reproductive/Obstetrics                             Anesthesia Physical Anesthesia Plan  ASA: III  Anesthesia Plan: General   Post-op Pain Management:    Induction: Intravenous  Airway Management Planned: Oral ETT  Additional Equipment:   Intra-op Plan:   Post-operative Plan: Extubation in OR  Informed Consent: I have reviewed the patients History and Physical, chart, labs and discussed the procedure including the risks, benefits and alternatives for the proposed anesthesia with the patient or authorized representative who has indicated his/her understanding and acceptance.     Plan Discussed with: CRNA and Surgeon  Anesthesia Plan Comments:         Anesthesia Quick Evaluation

## 2016-04-13 NOTE — Anesthesia Procedure Notes (Signed)
Procedure Name: Intubation Date/Time: 04/13/2016 10:37 AM Performed by: West Pugh Pre-anesthesia Checklist: Patient identified, Emergency Drugs available, Suction available, Patient being monitored and Timeout performed Patient Re-evaluated:Patient Re-evaluated prior to inductionOxygen Delivery Method: Circle system utilized Preoxygenation: Pre-oxygenation with 100% oxygen Intubation Type: IV induction Ventilation: Mask ventilation without difficulty Laryngoscope Size: Mac and 4 Grade View: Grade II Tube type: Oral Tube size: 7.5 mm Number of attempts: 1 Airway Equipment and Method: Stylet Placement Confirmation: ETT inserted through vocal cords under direct vision,  positive ETCO2,  CO2 detector and breath sounds checked- equal and bilateral Secured at: 22 cm Tube secured with: Tape Dental Injury: Teeth and Oropharynx as per pre-operative assessment

## 2016-04-13 NOTE — Care Management Note (Signed)
Case Management Note  Patient Details  Name: Bernardo Jodoin MRN: [id number] Date of Birth: 1946/04/03  Subjective/Objective:69 y/o m admitted w/renal mass. S/p Nephrectomy. From home.                    Action/Plan:d/c plan home.   Expected Discharge Date:                  Expected Discharge Plan:  Home/Self Care  In-House Referral:     Discharge planning Services  CM Consult  Post Acute Care Choice:    Choice offered to:     DME Arranged:    DME Agency:     HH Arranged:    HH Agency:     Status of Service:  In process, will continue to follow  Medicare Important Message Given:    Date Medicare IM Given:    Medicare IM give by:    Date Additional Medicare IM Given:    Additional Medicare Important Message give by:     If discussed at [place] of Stay Meetings, dates discussed:    Additional Comments:  Dessa Phi, RN 04/13/2016, 3:55 PM

## 2016-04-13 NOTE — Transfer of Care (Signed)
Immediate Anesthesia Transfer of Care Note  Patient: Jacob Berger  Procedure(s) Performed: Procedure(s): LAPAROSCOPIC RADICAL LEFT NEPHRECTOMY (Left)  Patient Location: PACU  Anesthesia Type:General  Level of Consciousness:  sedated, patient cooperative and responds to stimulation  Airway & Oxygen Therapy:Patient Spontanous Breathing and Patient connected to face mask oxgen  Post-op Assessment:  Report given to PACU RN and Post -op Vital signs reviewed and stable  Post vital signs:  Reviewed and stable  Last Vitals:  Filed Vitals:   04/13/16 0823 04/13/16 1256  BP: 152/73   Pulse: 57 71  Temp: 36.5 C 36.4 C  Resp: 18 9    Complications: No apparent anesthesia complications

## 2016-04-13 NOTE — Discharge Instructions (Signed)
1.  Activity:  You are encouraged to ambulate frequently (about every hour during waking hours) to help prevent blood clots from forming in your legs or lungs.  However, you should not engage in any heavy lifting (> 10-15 lbs), strenuous activity, or straining. 2. Diet: You should advance your diet as instructed by your physician.  It will be normal to have some bloating, nausea, and abdominal discomfort intermittently. 3. Prescriptions:  You will be provided a prescription for pain medication to take as needed.  If your pain is not severe enough to require the prescription pain medication, you may take extra strength Tylenol instead which will have less side effects.  You should also take a prescribed stool softener to avoid straining with bowel movements as the prescription pain medication may constipate you. 4. Incisions: You may remove your dressing bandages 48 hours after surgery if not removed in the hospital.  You will either have some small staples or special tissue glue at each of the incision sites. Once the bandages are removed (if present), the incisions may stay open to air.  You may start showering (but not soaking or bathing in water) the 2nd day after surgery and the incisions simply need to be patted dry after the shower.  No additional care is needed. 5. What to call us about: You should call the office [phone]) if you develop fever > 101 or develop persistent vomiting.  You may restart Brilinta, advil, aleve, vitamins, and supplements 7 days after surgery

## 2016-04-14 LAB — BASIC METABOLIC PANEL
ANION GAP: 9 (ref 5–15)
BUN: 22 mg/dL — AB (ref 6–20)
CHLORIDE: 107 mmol/L (ref 101–111)
CO2: 20 mmol/L — ABNORMAL LOW (ref 22–32)
Calcium: 8.9 mg/dL (ref 8.9–10.3)
Creatinine, Ser: 1.78 mg/dL — ABNORMAL HIGH (ref 0.61–1.24)
GFR calc Af Amer: 43 mL/min — ABNORMAL LOW (ref >60)
GFR, EST NON AFRICAN AMERICAN: 37 mL/min — AB (ref >60)
GLUCOSE: 216 mg/dL — AB (ref 65–99)
POTASSIUM: 4 mmol/L (ref 3.5–5.1)
Sodium: 136 mmol/L (ref 135–145)

## 2016-04-14 LAB — GLUCOSE, CAPILLARY
GLUCOSE-CAPILLARY: 170 mg/dL — AB (ref 65–99)
GLUCOSE-CAPILLARY: 193 mg/dL — AB (ref 65–99)
GLUCOSE-CAPILLARY: 205 mg/dL — AB (ref 65–99)
GLUCOSE-CAPILLARY: 274 mg/dL — AB (ref 65–99)
Glucose-Capillary: 218 mg/dL — ABNORMAL HIGH (ref 65–99)
Glucose-Capillary: 195 mg/dL — ABNORMAL HIGH (ref 65–99)

## 2016-04-14 LAB — HEMOGLOBIN AND HEMATOCRIT, BLOOD
HCT: 36.5 % — ABNORMAL LOW (ref 39.0–52.0)
HEMOGLOBIN: 12.8 g/dL — AB (ref 13.0–17.0)

## 2016-04-14 MED ORDER — BISACODYL 10 MG RE SUPP
10.0000 mg | Freq: Once | RECTAL | Status: AC
  Administered 2016-04-14: 10 mg via RECTAL
  Filled 2016-04-14: qty 1

## 2016-04-14 MED ORDER — LACTULOSE 10 GM/15ML PO SOLN
20.0000 g | Freq: Once | ORAL | Status: DC
  Filled 2016-04-14 (×2): qty 30

## 2016-04-14 MED ORDER — HYDROCODONE-ACETAMINOPHEN 5-325 MG PO TABS
1.0000 | ORAL_TABLET | Freq: Four times a day (QID) | ORAL | Status: DC | PRN
  Administered 2016-04-14 (×2): 1 via ORAL
  Administered 2016-04-14: 2 via ORAL
  Administered 2016-04-15 – 2016-04-16 (×4): 1 via ORAL
  Filled 2016-04-14 (×2): qty 1
  Filled 2016-04-14: qty 2
  Filled 2016-04-14 (×2): qty 1
  Filled 2016-04-14: qty 2
  Filled 2016-04-14: qty 1

## 2016-04-14 NOTE — Progress Notes (Signed)
Patient ID: Jacob Berger, male   DOB: [birth year], 70 y.o.   MRN: [id number]  Pt progressing slowly.  Ambulating slightly better.  No flatus yet.  Tolerating liquids.  Exam is benign.  - Advance diet - Dulcolax supposity - SL IVF - Will encourage increased ambulation - He expressed concern about going home tomorrow.  I explained that he likely will feel much improved in the morning.  I expressed the importance of him ambulating this afternoon and tonight though in order for him to progress.  He expressed his understanding.

## 2016-04-14 NOTE — Progress Notes (Signed)
RT placed patient on CPAP. Patient is on auto titration 5-20 H2O. Patient seems to be tolerating well. Sterile water was added to water chamber for humidification. RT will continue to monitor.

## 2016-04-14 NOTE — Progress Notes (Signed)
Patient ID: Jacob Berger, male   DOB: 11/11/1945, 70 y.o.   MRN: [id number]  1 Day Post-Op Subjective: Pt doing well.  No flatus.  Mild nausea but no vomiting.  Ambulating but not much.  Objective: Vital signs in last 24 hours: Temp:  [97 F (36.1 C)-97.9 F (36.6 C)] 97.9 F (36.6 C) (06/09 0522) Pulse Rate:  [57-76] 57 (06/09 0522) Resp:  [5-19] 16 (06/09 0522) BP: (116-167)/(60-97) 123/60 mmHg (06/09 0522) SpO2:  [98 %-100 %] 100 % (06/09 0522) Weight:  [117.935 kg (260 lb)] 117.935 kg (260 lb) (06/08 0845)  Intake/Output from previous day: 06/08 0701 - 06/09 0700 In: 3436.7 [I.V.:3236.7; IV Piggyback:200] Out: 2675 [Urine:2650; Blood:25] Intake/Output this shift:    Physical Exam:  General: Alert and oriented CV: RRR Lungs: Clear Abdomen: Soft, ND, positive BS Incisions: C/D/I Ext: NT, No erythema  Lab Results:  Recent Labs  04/13/16 1342 04/14/16 0450  HGB 13.2 12.8*  HCT 38.3* 36.5*   BMET  Recent Labs  04/13/16 1342 04/14/16 0450  NA 139 136  K 4.2 4.0  CL 108 107  CO2 21* 20*  GLUCOSE 215* 216*  BUN 18 22*  CREATININE 1.40* 1.78*  CALCIUM 8.9 8.9     Studies/Results: No results found.  Assessment/Plan: POD # 1 s/p left laparoscopic radical nephrectomy - D/C Foley - Ambulate, IS - Dulcolax suppository - Advance to carb modified diet - Oral pain medication - Decrease IVF - Monitor renal function   LOS: 1 day   Kanin Lia,LES 04/14/2016, 7:05 AM

## 2016-04-15 LAB — BASIC METABOLIC PANEL
ANION GAP: 7 (ref 5–15)
BUN: 22 mg/dL — ABNORMAL HIGH (ref 6–20)
CHLORIDE: 107 mmol/L (ref 101–111)
CO2: 22 mmol/L (ref 22–32)
Calcium: 9 mg/dL (ref 8.9–10.3)
Creatinine, Ser: 1.79 mg/dL — ABNORMAL HIGH (ref 0.61–1.24)
GFR calc Af Amer: 43 mL/min — ABNORMAL LOW (ref >60)
GFR calc non Af Amer: 37 mL/min — ABNORMAL LOW (ref >60)
GLUCOSE: 220 mg/dL — AB (ref 65–99)
POTASSIUM: 3.7 mmol/L (ref 3.5–5.1)
Sodium: 136 mmol/L (ref 135–145)

## 2016-04-15 LAB — GLUCOSE, CAPILLARY
GLUCOSE-CAPILLARY: 291 mg/dL — AB (ref 65–99)
Glucose-Capillary: 232 mg/dL — ABNORMAL HIGH (ref 65–99)
Glucose-Capillary: 293 mg/dL — ABNORMAL HIGH (ref 65–99)
Glucose-Capillary: 240 mg/dL — ABNORMAL HIGH (ref 65–99)

## 2016-04-15 NOTE — Progress Notes (Signed)
Patient has had two small loose BM's and states he is passing gas.  Jacob Berger. Brigitte Pulse, RN

## 2016-04-15 NOTE — Progress Notes (Signed)
Patient ID: Jacob Berger, male   DOB: 07/12/1946, 70 y.o.   MRN: [id number]  Pt progressing slowly.  Ambulatingin halls without difficulty  No flatus yet.  Tolerating regular diet  Exam is benign.  - lactulose 20g once -continue ambulation in halls -likely discharge tomorrow  Nicolette Bang

## 2016-04-16 LAB — GLUCOSE, CAPILLARY: Glucose-Capillary: 219 mg/dL — ABNORMAL HIGH (ref 65–99)

## 2016-04-16 MED ORDER — POLYETHYLENE GLYCOL 3350 17 G PO PACK: 17.0000 g | PACK | Freq: Every day | ORAL | Status: DC

## 2016-04-16 NOTE — Care Management Important Message (Signed)
Important Message  Patient Details  Name: Mkai Ferryman MRN: [id number] Date of Birth: 1946-06-05   Medicare Important Message Given:  Yes    Erenest Rasher, RN 04/16/2016, 11:13 AM

## 2016-04-18 NOTE — Discharge Summary (Addendum)
Physician Discharge Summary  Patient ID: Jacob Berger MRN: [id number] DOB/AGE: [birth year] 70 y.o.  Admit date: 04/13/2016 Discharge date: 04/16/2016  Admission Diagnoses:  Discharge Diagnoses:  Active Problems:   Neoplasm of left kidney   Discharged Condition: good  Hospital Course: The patient tolerated the procedure well and was transferred to the floor on IV pain meds, IV fluid. On POD#1 foley was removed, pt was started on clear liquid diet and they ambulated in the halls. On POD#2 the patient was transitioned to a regular diet, IVFs were discontinued, and the patient passed flatus. Prior to discharge the pt was tolerating a regular diet, pain was controlled on PO pain meds, they were ambulating without difficulty, and they had normal bowel function.   Consults: None  Significant Diagnostic Studies: none  Treatments: surgery: laparoscopic radical nephrectomy  Discharge Exam: Blood pressure 142/83, pulse 66, temperature 98.4 F (36.9 C), temperature source Oral, resp. rate 18, height 6' (1.829 m), weight 117.935 kg (260 lb), SpO2 95 %. General appearance: alert, cooperative and appears stated age Head: Normocephalic, without obvious abnormality, atraumatic Eyes: conjunctivae/corneas clear. PERRL, EOM's intact. Fundi benign. Resp: clear to auscultation bilaterally GI: soft, non-tender; bowel sounds normal; no masses,  no organomegaly Extremities: extremities normal, atraumatic, no cyanosis or edema Neurologic: Alert and oriented X 3, normal strength and tone. Normal symmetric reflexes. Normal coordination and gait  Disposition: 01-Home or Self Care     Medication List    STOP taking these medications        BRILINTA 60 MG Tabs tablet  Generic drug:  ticagrelor     Cod Liver Oil w/Vit A & D Caps      TAKE these medications        allopurinol 300 MG tablet  Commonly known as:  ZYLOPRIM  Take 300 mg by mouth daily.     AZOR 10-40 MG tablet  Generic drug:   amLODipine-olmesartan  TAKE 1 TABLET BY MOUTH DAILY     B-D ULTRAFINE III SHORT PEN 31G X 8 MM Misc  Generic drug:  Insulin Pen Needle     betamethasone valerate 0.1 % cream  Commonly known as:  VALISONE  Apply 1 application topically daily.     CITRUCEL PO  Take 1 tablet by mouth daily.     diphenhydrAMINE 25 mg capsule  Commonly known as:  BENADRYL  Take 25 mg by mouth every 6 (six) hours as needed for itching.     dorzolamide-timolol 22.3-6.8 MG/ML ophthalmic solution  Commonly known as:  COSOPT  Place 1 drop into both eyes 2 (two) times daily.     fenofibrate 145 MG tablet  Commonly known as:  TRICOR  Take 145 mg by mouth daily.     furosemide 40 MG tablet  Commonly known as:  LASIX  Take 20-40 mg by mouth daily as needed for fluid or edema.     HYDROcodone-acetaminophen 5-325 MG tablet  Commonly known as:  NORCO  Take 1-2 tablets by mouth every 6 (six) hours as needed for moderate pain.     LEVEMIR FLEXTOUCH 100 UNIT/ML Pen  Generic drug:  Insulin Detemir  Inject 50 Units into the skin at bedtime.     metFORMIN 500 MG tablet  Commonly known as:  GLUCOPHAGE  Take 1,000 mg by mouth 2 (two) times daily with a meal.     metoprolol succinate 100 MG 24 hr tablet  Commonly known as:  TOPROL-XL  Take 1 tablet (100 mg total) by mouth daily.  polyethylene glycol packet  Commonly known as:  MIRALAX  Take 17 g by mouth daily.     TANZEUM 50 MG Pen  Generic drug:  Albiglutide  Inject 50 mg into the skin every Thursday.     travoprost (benzalkonium) 0.004 % ophthalmic solution  Commonly known as:  TRAVATAN  Place 1 drop into both eyes at bedtime.     triamcinolone cream 0.1 %  Commonly known as:  KENALOG  Apply 1 application topically 3 (three) times daily as needed (Apply to affected area.).     VYTORIN 10-40 MG tablet  Generic drug:  ezetimibe-simvastatin  Take 1 tablet by mouth daily at 6 PM.           Follow-up Information    Follow up with  Dutch Gray, MD On 05/03/2016.   Specialty:  Urology   Why:  at 12:45   Contact information:   [place] [address] [phone]       Signed: Nicolette Bang 04/18/2016, 12:31 PM

## 2016-04-26 DIAGNOSIS — E789 Disorder of lipoprotein metabolism, unspecified: Secondary | ICD-10-CM | POA: Diagnosis not present

## 2016-04-26 DIAGNOSIS — I10 Essential (primary) hypertension: Secondary | ICD-10-CM | POA: Diagnosis not present

## 2016-04-26 DIAGNOSIS — E118 Type 2 diabetes mellitus with unspecified complications: Secondary | ICD-10-CM | POA: Diagnosis not present

## 2016-05-03 DIAGNOSIS — C642 Malignant neoplasm of left kidney, except renal pelvis: Secondary | ICD-10-CM | POA: Diagnosis not present

## 2016-05-03 DIAGNOSIS — C649 Malignant neoplasm of unspecified kidney, except renal pelvis: Secondary | ICD-10-CM | POA: Diagnosis not present

## 2016-05-03 DIAGNOSIS — E789 Disorder of lipoprotein metabolism, unspecified: Secondary | ICD-10-CM | POA: Diagnosis not present

## 2016-05-03 DIAGNOSIS — I10 Essential (primary) hypertension: Secondary | ICD-10-CM | POA: Diagnosis not present

## 2016-05-03 DIAGNOSIS — E118 Type 2 diabetes mellitus with unspecified complications: Secondary | ICD-10-CM | POA: Diagnosis not present

## 2016-05-06 HISTORY — PX: TRANSTHORACIC ECHOCARDIOGRAM: SHX275

## 2016-05-10 ENCOUNTER — Emergency Department (HOSPITAL_COMMUNITY): Payer: Medicare Other

## 2016-05-10 ENCOUNTER — Observation Stay (HOSPITAL_COMMUNITY)
Admission: EM | Admit: 2016-05-10 | Discharge: 2016-05-12 | Disposition: A | Discharge status: Home / Self Care | Payer: Medicare Other | Attending: Internal Medicine | Admitting: Internal Medicine

## 2016-05-10 ENCOUNTER — Encounter (HOSPITAL_COMMUNITY): Payer: Self-pay

## 2016-05-10 ENCOUNTER — Observation Stay (HOSPITAL_COMMUNITY): Payer: Medicare Other

## 2016-05-10 ENCOUNTER — Observation Stay (HOSPITAL_BASED_OUTPATIENT_CLINIC_OR_DEPARTMENT_OTHER): Payer: Medicare Other

## 2016-05-10 ENCOUNTER — Emergency Department (EMERGENCY_DEPARTMENT_HOSPITAL)
Admit: 2016-05-10 | Discharge: 2016-05-10 | Disposition: A | Discharge status: Home / Self Care | Payer: Medicare Other | Attending: Emergency Medicine | Admitting: Emergency Medicine

## 2016-05-10 DIAGNOSIS — N179 Acute kidney failure, unspecified: Secondary | ICD-10-CM | POA: Diagnosis not present

## 2016-05-10 DIAGNOSIS — Z8546 Personal history of malignant neoplasm of prostate: Secondary | ICD-10-CM | POA: Diagnosis not present

## 2016-05-10 DIAGNOSIS — L03116 Cellulitis of left lower limb: Secondary | ICD-10-CM | POA: Insufficient documentation

## 2016-05-10 DIAGNOSIS — E119 Type 2 diabetes mellitus without complications: Secondary | ICD-10-CM | POA: Diagnosis not present

## 2016-05-10 DIAGNOSIS — R0600 Dyspnea, unspecified: Secondary | ICD-10-CM

## 2016-05-10 DIAGNOSIS — Z87891 Personal history of nicotine dependence: Secondary | ICD-10-CM | POA: Insufficient documentation

## 2016-05-10 DIAGNOSIS — Z7984 Long term (current) use of oral hypoglycemic drugs: Secondary | ICD-10-CM | POA: Diagnosis not present

## 2016-05-10 DIAGNOSIS — M7989 Other specified soft tissue disorders: Secondary | ICD-10-CM | POA: Diagnosis present

## 2016-05-10 DIAGNOSIS — R42 Dizziness and giddiness: Secondary | ICD-10-CM | POA: Insufficient documentation

## 2016-05-10 DIAGNOSIS — I2510 Atherosclerotic heart disease of native coronary artery without angina pectoris: Secondary | ICD-10-CM

## 2016-05-10 DIAGNOSIS — R2990 Unspecified symptoms and signs involving the nervous system: Secondary | ICD-10-CM | POA: Diagnosis present

## 2016-05-10 DIAGNOSIS — Z955 Presence of coronary angioplasty implant and graft: Secondary | ICD-10-CM | POA: Insufficient documentation

## 2016-05-10 DIAGNOSIS — M79606 Pain in leg, unspecified: Secondary | ICD-10-CM

## 2016-05-10 DIAGNOSIS — Z85528 Personal history of other malignant neoplasm of kidney: Secondary | ICD-10-CM | POA: Diagnosis present

## 2016-05-10 DIAGNOSIS — R609 Edema, unspecified: Secondary | ICD-10-CM

## 2016-05-10 DIAGNOSIS — R600 Localized edema: Secondary | ICD-10-CM | POA: Diagnosis not present

## 2016-05-10 DIAGNOSIS — I10 Essential (primary) hypertension: Secondary | ICD-10-CM | POA: Insufficient documentation

## 2016-05-10 DIAGNOSIS — Z9861 Coronary angioplasty status: Secondary | ICD-10-CM

## 2016-05-10 DIAGNOSIS — M79605 Pain in left leg: Secondary | ICD-10-CM | POA: Diagnosis not present

## 2016-05-10 DIAGNOSIS — I252 Old myocardial infarction: Secondary | ICD-10-CM | POA: Diagnosis not present

## 2016-05-10 DIAGNOSIS — R0602 Shortness of breath: Secondary | ICD-10-CM | POA: Insufficient documentation

## 2016-05-10 DIAGNOSIS — G4733 Obstructive sleep apnea (adult) (pediatric): Secondary | ICD-10-CM | POA: Diagnosis present

## 2016-05-10 DIAGNOSIS — R4182 Altered mental status, unspecified: Secondary | ICD-10-CM | POA: Diagnosis not present

## 2016-05-10 DIAGNOSIS — I119 Hypertensive heart disease without heart failure: Secondary | ICD-10-CM | POA: Diagnosis present

## 2016-05-10 DIAGNOSIS — R55 Syncope and collapse: Secondary | ICD-10-CM | POA: Diagnosis not present

## 2016-05-10 DIAGNOSIS — Z9989 Dependence on other enabling machines and devices: Secondary | ICD-10-CM

## 2016-05-10 DIAGNOSIS — E878 Other disorders of electrolyte and fluid balance, not elsewhere classified: Secondary | ICD-10-CM | POA: Diagnosis not present

## 2016-05-10 DIAGNOSIS — R2689 Other abnormalities of gait and mobility: Secondary | ICD-10-CM

## 2016-05-10 DIAGNOSIS — D49512 Neoplasm of unspecified behavior of left kidney: Secondary | ICD-10-CM

## 2016-05-10 HISTORY — DX: Malignant neoplasm of unspecified kidney, except renal pelvis: C64.9

## 2016-05-10 HISTORY — DX: Type 2 diabetes mellitus without complications: E11.9

## 2016-05-10 HISTORY — DX: Malignant neoplasm of prostate: C61

## 2016-05-10 HISTORY — DX: Headache, unspecified: R51.9

## 2016-05-10 HISTORY — DX: Unspecified osteoarthritis, unspecified site: M19.90

## 2016-05-10 HISTORY — DX: Headache: R51

## 2016-05-10 LAB — I-STAT TROPONIN, ED: TROPONIN I, POC: 0 ng/mL (ref 0.00–0.08)

## 2016-05-10 LAB — CBC WITH DIFFERENTIAL/PLATELET
Basophils Absolute: 0 10*3/uL (ref 0.0–0.1)
Basophils Relative: 0 %
Eosinophils Absolute: 0.1 10*3/uL (ref 0.0–0.7)
Eosinophils Relative: 2 %
HEMATOCRIT: 36.7 % — AB (ref 39.0–52.0)
HEMOGLOBIN: 12.3 g/dL — AB (ref 13.0–17.0)
LYMPHS ABS: 2 10*3/uL (ref 0.7–4.0)
LYMPHS PCT: 27 %
MCH: 28.4 pg (ref 26.0–34.0)
MCHC: 33.5 g/dL (ref 30.0–36.0)
MCV: 84.8 fL (ref 78.0–100.0)
Monocytes Absolute: 0.6 10*3/uL (ref 0.1–1.0)
Monocytes Relative: 8 %
NEUTROS ABS: 4.5 10*3/uL (ref 1.7–7.7)
NEUTROS PCT: 63 %
Platelets: 214 10*3/uL (ref 150–400)
RBC: 4.33 MIL/uL (ref 4.22–5.81)
RDW: 14.5 % (ref 11.5–15.5)
WBC: 7.2 10*3/uL (ref 4.0–10.5)

## 2016-05-10 LAB — I-STAT CHEM 8, ED
BUN: 41 mg/dL — AB (ref 6–20)
CALCIUM ION: 1.23 mmol/L (ref 1.12–1.23)
CHLORIDE: 104 mmol/L (ref 101–111)
Creatinine, Ser: 2.6 mg/dL — ABNORMAL HIGH (ref 0.61–1.24)
Glucose, Bld: 232 mg/dL — ABNORMAL HIGH (ref 65–99)
HCT: 39 % (ref 39.0–52.0)
Hemoglobin: 13.3 g/dL (ref 13.0–17.0)
Potassium: 3.7 mmol/L (ref 3.5–5.1)
SODIUM: 139 mmol/L (ref 135–145)
TCO2: 22 mmol/L (ref 0–100)

## 2016-05-10 LAB — ECHOCARDIOGRAM COMPLETE
CHL CUP RV SYS PRESS: 28 mmHg
E/e' ratio: 9.74
EWDT: 280 ms
FS: 28 % (ref 28–44)
Height: 73 in
IV/PV OW: 0.95
LA ID, A-P, ES: 39 mm
LA vol: 48 mL
LADIAMINDEX: 1.62 cm/m2
LAVOLA4C: 47.2 mL
LAVOLIN: 19.9 mL/m2
LDCA: 3.46 cm2
LEFT ATRIUM END SYS DIAM: 39 mm
LV E/e' medial: 9.74
LV E/e'average: 9.74
LV PW d: 12.3 mm — AB (ref 0.6–1.1)
LV TDI E'MEDIAL: 4.9
LV e' LATERAL: 7.62 cm/s
LVOTD: 21 mm
Lateral S' vel: 12 cm/s
MV Dec: 280
MV pk E vel: 74.2 m/s
MVPG: 2 mmHg
MVPKAVEL: 80 m/s
P 1/2 time: 610 ms
Reg peak vel: 251 cm/s
TDI e' lateral: 7.62
TR max vel: 251 cm/s
Weight: 3880.1 oz

## 2016-05-10 LAB — URINALYSIS, ROUTINE W REFLEX MICROSCOPIC
Bilirubin Urine: NEGATIVE
GLUCOSE, UA: NEGATIVE mg/dL
HGB URINE DIPSTICK: NEGATIVE
Ketones, ur: NEGATIVE mg/dL
Leukocytes, UA: NEGATIVE
Nitrite: NEGATIVE
Protein, ur: NEGATIVE mg/dL
SPECIFIC GRAVITY, URINE: 1.015 (ref 1.005–1.030)
pH: 5 (ref 5.0–8.0)

## 2016-05-10 LAB — COMPREHENSIVE METABOLIC PANEL
ALBUMIN: 4.1 g/dL (ref 3.5–5.0)
ALT: 20 U/L (ref 17–63)
ANION GAP: 12 (ref 5–15)
AST: 26 U/L (ref 15–41)
Alkaline Phosphatase: 75 U/L (ref 38–126)
BUN: 40 mg/dL — ABNORMAL HIGH (ref 6–20)
CO2: 20 mmol/L — AB (ref 22–32)
Calcium: 10.2 mg/dL (ref 8.9–10.3)
Chloride: 104 mmol/L (ref 101–111)
Creatinine, Ser: 2.48 mg/dL — ABNORMAL HIGH (ref 0.61–1.24)
GFR calc Af Amer: 29 mL/min — ABNORMAL LOW (ref >60)
GFR calc non Af Amer: 25 mL/min — ABNORMAL LOW (ref >60)
GLUCOSE: 235 mg/dL — AB (ref 65–99)
POTASSIUM: 3.6 mmol/L (ref 3.5–5.1)
SODIUM: 136 mmol/L (ref 135–145)
Total Bilirubin: 0.5 mg/dL (ref 0.3–1.2)
Total Protein: 8.5 g/dL — ABNORMAL HIGH (ref 6.5–8.1)

## 2016-05-10 LAB — I-STAT CG4 LACTIC ACID, ED: Lactic Acid, Venous: 2.04 mmol/L (ref 0.5–1.9)

## 2016-05-10 LAB — TROPONIN I
TROPONIN I: 0.03 ng/mL — AB (ref <0.03)
TROPONIN I: 0.05 ng/mL — AB (ref <0.03)

## 2016-05-10 LAB — BRAIN NATRIURETIC PEPTIDE: B Natriuretic Peptide: 18.5 pg/mL (ref 0.0–100.0)

## 2016-05-10 LAB — GLUCOSE, CAPILLARY: GLUCOSE-CAPILLARY: 118 mg/dL — AB (ref 65–99)

## 2016-05-10 LAB — LIPASE, BLOOD: Lipase: 65 U/L — ABNORMAL HIGH (ref 11–51)

## 2016-05-10 MED ORDER — DORZOLAMIDE HCL-TIMOLOL MAL 2-0.5 % OP SOLN
1.0000 [drp] | Freq: Two times a day (BID) | OPHTHALMIC | Status: DC
  Administered 2016-05-10 – 2016-05-12 (×4): 1 [drp] via OPHTHALMIC
  Filled 2016-05-10: qty 10

## 2016-05-10 MED ORDER — POLYETHYLENE GLYCOL 3350 17 G PO PACK: 17.0000 g | PACK | Freq: Every day | ORAL | Status: DC | PRN

## 2016-05-10 MED ORDER — SODIUM CHLORIDE 0.9% FLUSH
3.0000 mL | Freq: Two times a day (BID) | INTRAVENOUS | Status: DC
  Administered 2016-05-11: 3 mL via INTRAVENOUS

## 2016-05-10 MED ORDER — SODIUM CHLORIDE 0.9 % IV SOLN
Freq: Once | INTRAVENOUS | Status: AC
  Administered 2016-05-10: 16:00:00 via INTRAVENOUS

## 2016-05-10 MED ORDER — ONDANSETRON HCL 4 MG PO TABS: 4.0000 mg | ORAL_TABLET | Freq: Four times a day (QID) | ORAL | Status: DC | PRN

## 2016-05-10 MED ORDER — INSULIN ASPART 100 UNIT/ML ~~LOC~~ SOLN
0.0000 [IU] | Freq: Three times a day (TID) | SUBCUTANEOUS | Status: DC
  Administered 2016-05-11: 3 [IU] via SUBCUTANEOUS
  Administered 2016-05-11 (×2): 2 [IU] via SUBCUTANEOUS
  Administered 2016-05-12: 1 [IU] via SUBCUTANEOUS
  Administered 2016-05-12: 2 [IU] via SUBCUTANEOUS

## 2016-05-10 MED ORDER — ACETAMINOPHEN 650 MG RE SUPP: 650.0000 mg | Freq: Four times a day (QID) | RECTAL | Status: DC | PRN

## 2016-05-10 MED ORDER — HYDRALAZINE HCL 20 MG/ML IJ SOLN: 10.0000 mg | Freq: Three times a day (TID) | INTRAMUSCULAR | Status: DC | PRN

## 2016-05-10 MED ORDER — SODIUM CHLORIDE 0.9 % IV SOLN
INTRAVENOUS | Status: DC
  Administered 2016-05-10: 16:00:00 via INTRAVENOUS

## 2016-05-10 MED ORDER — POLYETHYLENE GLYCOL 3350 17 G PO PACK
17.0000 g | PACK | Freq: Every day | ORAL | Status: DC
  Administered 2016-05-11 – 2016-05-12 (×2): 17 g via ORAL
  Filled 2016-05-10 (×2): qty 1

## 2016-05-10 MED ORDER — SODIUM CHLORIDE 0.9 % IV BOLUS (SEPSIS)
500.0000 mL | Freq: Once | INTRAVENOUS | Status: AC
  Administered 2016-05-10: 500 mL via INTRAVENOUS

## 2016-05-10 MED ORDER — TECHNETIUM TC 99M DIETHYLENETRIAME-PENTAACETIC ACID: 30.5000 | Freq: Once | INTRAVENOUS | Status: DC | PRN

## 2016-05-10 MED ORDER — ALLOPURINOL 300 MG PO TABS
300.0000 mg | ORAL_TABLET | Freq: Every day | ORAL | Status: DC
  Administered 2016-05-11 – 2016-05-12 (×2): 300 mg via ORAL
  Filled 2016-05-10 (×3): qty 1

## 2016-05-10 MED ORDER — ONDANSETRON HCL 4 MG/2ML IJ SOLN: 4.0000 mg | Freq: Four times a day (QID) | INTRAMUSCULAR | Status: DC | PRN

## 2016-05-10 MED ORDER — SODIUM CHLORIDE 0.9 % IV SOLN
INTRAVENOUS | Status: DC
  Administered 2016-05-10 – 2016-05-11 (×2): via INTRAVENOUS

## 2016-05-10 MED ORDER — TECHNETIUM TO 99M ALBUMIN AGGREGATED
4.3800 | Freq: Once | INTRAVENOUS | Status: AC | PRN
  Administered 2016-05-10: 4 via INTRAVENOUS

## 2016-05-10 MED ORDER — ACETAMINOPHEN 325 MG PO TABS: 650.0000 mg | ORAL_TABLET | Freq: Four times a day (QID) | ORAL | Status: DC | PRN

## 2016-05-10 MED ORDER — ACETAMINOPHEN 325 MG PO TABS
650.0000 mg | ORAL_TABLET | ORAL | Status: DC | PRN
  Administered 2016-05-12: 650 mg via ORAL
  Filled 2016-05-10: qty 2

## 2016-05-10 MED ORDER — HYDROCODONE-ACETAMINOPHEN 5-325 MG PO TABS: 1.0000 | ORAL_TABLET | Freq: Four times a day (QID) | ORAL | Status: DC | PRN

## 2016-05-10 MED ORDER — DIPHENHYDRAMINE HCL 25 MG PO CAPS: 25.0000 mg | ORAL_CAPSULE | Freq: Four times a day (QID) | ORAL | Status: DC | PRN

## 2016-05-10 MED ORDER — BISACODYL 5 MG PO TBEC: 5.0000 mg | DELAYED_RELEASE_TABLET | Freq: Every day | ORAL | Status: DC | PRN

## 2016-05-10 MED ORDER — DOXYCYCLINE HYCLATE 100 MG PO TABS
100.0000 mg | ORAL_TABLET | Freq: Two times a day (BID) | ORAL | Status: DC
  Administered 2016-05-10 – 2016-05-12 (×4): 100 mg via ORAL
  Filled 2016-05-10 (×4): qty 1

## 2016-05-10 MED ORDER — TICAGRELOR 60 MG PO TABS
60.0000 mg | ORAL_TABLET | Freq: Two times a day (BID) | ORAL | Status: DC
  Administered 2016-05-10 – 2016-05-12 (×4): 60 mg via ORAL
  Filled 2016-05-10 (×4): qty 1

## 2016-05-10 MED ORDER — FENOFIBRATE 54 MG PO TABS
54.0000 mg | ORAL_TABLET | Freq: Every day | ORAL | Status: DC
  Administered 2016-05-11 – 2016-05-12 (×2): 54 mg via ORAL
  Filled 2016-05-10 (×2): qty 1

## 2016-05-10 MED ORDER — TRAVOPROST (BAK FREE) 0.004 % OP SOLN
1.0000 [drp] | Freq: Every day | OPHTHALMIC | Status: DC
  Administered 2016-05-10 – 2016-05-11 (×2): 1 [drp] via OPHTHALMIC
  Filled 2016-05-10: qty 2.5

## 2016-05-10 MED ORDER — EZETIMIBE-SIMVASTATIN 10-40 MG PO TABS
1.0000 | ORAL_TABLET | Freq: Every day | ORAL | Status: DC
  Administered 2016-05-10 – 2016-05-11 (×2): 1 via ORAL
  Filled 2016-05-10 (×3): qty 1

## 2016-05-10 NOTE — ED Notes (Addendum)
Patient developed chills on 7/3, had nephrectomy early June and has been recovering well. States he went to grocery store on 7/3 and doesn't remember driving back home and on arrival to home was diaphoretic and seemed altered. Today on assessment alert and oriented, denies pain. NAD. Patient complains of ongoing headache since Monday and now concerned with left leg swelling, no trauma

## 2016-05-10 NOTE — Progress Notes (Signed)
Admission note:  Arrival Method: Stretcher from ED Mental Orientation: A&OX4 Telemetry: 6E20 CCMD notified, 2 RNs verified Assessment: See flowsheet Skin: Dry; intact, redden area on LLE IV: R A/C Pain: 3/10 LLE pain Tubes: N/A Safety Measures: Bed in lowest position, call light within reach, bed alarm activated, nonskid socks placed, pt in camera room and educated. Fall Prevention Safety Plan: Reviewed with pt and family Admission Screening: To be completed 6700 Orientation: Patient has been oriented to the unit, staff and to the room.  MD aware of pt's admission to unit. Orders have been reviewed and implemented. Will continue to assess and monitor pt.   Carole Civil, RN, [facility] [place] [address] [place] Phone [phone]

## 2016-05-10 NOTE — Progress Notes (Signed)
Patient stated that he didn't want to not wear CPAP tonight and would call if he changed his mind. RT will continue to monitor.

## 2016-05-10 NOTE — ED Notes (Signed)
Pt reports episode of confusion after going to farmer's market and getting into truck w/ broken air conditioning system. Pt fails to remember events from parking lot to house and is concerned. Recent nephrectomy. Pt also c/o some pain in his chest starting around the same time. Pt also c/o left ankle/calf swelling and redness. A&O x 4, NAD. Eddie Dibbles, resident MD, at bedside at this time.

## 2016-05-10 NOTE — H&P (Signed)
History and Physical    Jacob Berger [id number] DOB: August 21, 1946 DOA: 05/10/2016  PCP: Jacob Bolt, MD  Patient coming from:    Home    Chief Complaint:  doesn't feel well  HPI: Jacob Berger is a 70 y.o. male with medical history significant for DM2, HTN, and RCC s/p left nephrectomy early June. A few days ago patient went to grocery store, returned home confused. Couldn't really remember driving home. Once home he had difficulty getting in house the way he usually does. Made it inside, sat at table where wife found him confused, soaked in sweat but it was very hot outside. Upon standing he had balance problems. Patient recalls having a severe headache in back left head the same day. Since this episode patient hasn't had anymore confusion issues but complains of balance problems. He has also developed SOB over last couple of days. Patient gave history of chest pain to EDP. Upon further questioning the chest pain is chronic, intermittent. Pain is left sided, occasionally radiates through to left back and is not exertional. Pain has not changed in any way and Cardiologist is "just watching it for now"   ED Course:  Afebrile, VSS.  WBC normal Hgb 12.3 bun 40 / Cr 2.4 Lactic acid 2.04 Chest x-ray no acute findings  Review of Systems: As per HPI, otherwise 10 point review of systems negative.    Past Medical History  Diagnosis Date  . CAD S/P percutaneous coronary angioplasty 2011    PCI of PDA Endeavor 2.5 mm x 12 mm DES - [place] [facility]  . Obesity, Class II, BMI 35-39.9, with comorbidity (HCC)     BMI 37  . Hypertension, essential   . Diabetes mellitus, type II, insulin dependent (HCC)     No longer on insulin according to his current med list   . Dyslipidemia, goal LDL below 70   . Gout   . Glaucoma   . OSA on CPAP   . Cancer [facility]) 2007    prostate  . Complication of anesthesia   . PONV (postoperative nausea and vomiting)     ponv after  colonscopy  . Rash  since 04-08-16    on neck healing, right arm improving  . Thoracic aortic aneurysm without rupture ([place]) - Noted on chest CT. 4.5 cm 02/15/2016    4.5 cm per dr Cyndia Bent note and [facility] ct 01-18-16    Past Surgical History  Procedure Laterality Date  . Prostatectomy  1995  . Nm myoview ltd  June 2013; Sep 2016    a. Treadmill Myoview: 10 minutes, 12 METS; no ischemia infarction, EF 65%; b. [facility]: LOW RISK. EF 57%. 10.4 METS low risk scan no ischemia. No wall motion abnormality.   . Prostatectomy  2007  . Coronary angioplasty with stent placement  2001    [place]: Endeavor 2.5 mm x 12 mm DES - distal PDA ([facility])  . Colonscopy  yrs ago    x 3 maybe more  . Laparoscopic nephrectomy Left 04/13/2016    Procedure: LAPAROSCOPIC RADICAL LEFT NEPHRECTOMY;  Surgeon: Raynelle Bring, MD;  Location: [facility];  Service: Urology;  Laterality: Left;    Social History   Social History  . Marital Status: Married    Spouse Name: N/A  . Number of Children: N/A  . Years of Education: N/A   Occupational History  . Not on file.   Social History Main Topics  . Smoking status: Former Smoker  Types: Cigarettes    Quit date: 06/04/1993  . Smokeless tobacco: Never Used     Comment: A PK WOULD LAST A WEEK  . Alcohol Use: 0.0 oz/week    0 Standard drinks or equivalent per week     Comment: occ wine or beer  . Drug Use: No  . Sexual Activity: Not on file   Other Topics Concern  . Not on file   Social History Narrative   Married father of 3. Had been walking up to 2 miles every other day appetite is good his weight for about half an hour time period, but limited now due to back pain. Occasional alcohol. No tobacco products -- he quit about 20-30 years ago.   Lives at home with wife. No assistive devices needed for ambulation  Allergies  Allergen Reactions  . Accupril [Quinapril Hcl] Swelling and Other (See Comments)    Mouth swelling    Family History  Problem  Relation Age of Onset  . Family history unknown: Yes    Prior to Admission medications   Medication Sig Start Date End Date Taking? Authorizing Provider  Albiglutide (TANZEUM) 50 MG PEN Inject 50 mg into the skin every Thursday.     Historical Provider, MD  allopurinol (ZYLOPRIM) 300 MG tablet Take 300 mg by mouth daily.    Historical Provider, MD  AZOR 10-40 MG per tablet TAKE 1 TABLET BY MOUTH DAILY 12/18/13   Leonie Man, MD  B-D ULTRAFINE III SHORT PEN 31G X 8 MM MISC  02/23/15   Historical Provider, MD  betamethasone valerate (VALISONE) 0.1 % cream Apply 1 application topically daily.  04/01/15   Historical Provider, MD  diphenhydrAMINE (BENADRYL) 25 mg capsule Take 25 mg by mouth every 6 (six) hours as needed for itching.    Historical Provider, MD  dorzolamide-timolol (COSOPT) 22.3-6.8 MG/ML ophthalmic solution Place 1 drop into both eyes 2 (two) times daily.    Historical Provider, MD  fenofibrate (TRICOR) 145 MG tablet Take 145 mg by mouth daily.    Historical Provider, MD  furosemide (LASIX) 40 MG tablet Take 20-40 mg by mouth daily as needed for fluid or edema.     Historical Provider, MD  HYDROcodone-acetaminophen (NORCO) 5-325 MG tablet Take 1-2 tablets by mouth every 6 (six) hours as needed for moderate pain. 04/13/16   Amanda Dancy, PA-C  LEVEMIR FLEXTOUCH 100 UNIT/ML Pen Inject 50 Units into the skin at bedtime. 01/04/16   Historical Provider, MD  metFORMIN (GLUCOPHAGE) 500 MG tablet Take 1,000 mg by mouth 2 (two) times daily with a meal.     Historical Provider, MD  Methylcellulose, Laxative, (CITRUCEL PO) Take 1 tablet by mouth daily.    Historical Provider, MD  metoprolol succinate (TOPROL-XL) 100 MG 24 hr tablet Take 1 tablet (100 mg total) by mouth daily. 12/31/15   Leonie Man, MD  polyethylene glycol [facility]) packet Take 17 g by mouth daily. 04/16/16   Cleon Gustin, MD  travoprost, benzalkonium, (TRAVATAN) 0.004 % ophthalmic solution Place 1 drop into both eyes at  bedtime.    Historical Provider, MD  triamcinolone cream (KENALOG) 0.1 % Apply 1 application topically 3 (three) times daily as needed (Apply to affected area.).  04/09/15   Historical Provider, MD  VYTORIN 10-40 MG per tablet Take 1 tablet by mouth daily at 6 PM.  04/27/15   Historical Provider, MD    Physical Exam: Filed Vitals:   05/10/16 1203 05/10/16 1210  BP: 132/66  Pulse: 72   Temp:  98.8 F (37.1 C)  TempSrc:  Oral  Resp: 16   Weight: 110.043 kg (242 lb 9.6 oz)   SpO2: 100%     Constitutional:  Pleasant well developed black male in NAD, calm, comfortable Filed Vitals:   05/10/16 1203 05/10/16 1210  BP: 132/66   Pulse: 72   Temp:  98.8 F (37.1 C)  TempSrc:  Oral  Resp: 16   Weight: 110.043 kg (242 lb 9.6 oz)   SpO2: 100%    Eyes: PER, lids and conjunctivae normal ENMT: Mucous membranes are moist. Posterior pharynx clear of any exudate or lesions..  Neck: normal, supple, no masses Respiratory: clear to auscultation bilaterally, no wheezing, no crackles. Normal respiratory effort. No accessory muscle use.  Cardiovascular: Regular rate and rhythm, no murmurs / rubs / gallops. LLE 2+ edema. Distal extremity erythematous, no obvious trauma. 2+ pedal pulses.   Abdomen: no tenderness, no masses palpated. No hepatomegaly. Bowel sounds positive.  Musculoskeletal: no clubbing / cyanosis. No joint deformity upper and lower extremities. Good ROM, no contractures. Normal muscle tone.  Skin: no rashes, lesions, ulcers.  Neurologic: CN 2-12 grossly intact. Sensation intact, Strength 5/5 in all 4.  Psychiatric: Normal judgment and insight. Alert and oriented x 3. Normal mood.    Labs on Admission: I have personally reviewed following labs and imaging studies  Radiological Exams on Admission: Dg Chest 2 View  05/10/2016  CLINICAL DATA:  Diaphoresis and altered mental status. EXAM: CHEST  2 VIEW COMPARISON:  Chest radiograph November 15, 2015; chest CT January 18, 2016 FINDINGS: There  is no edema or consolidation. Heart size and pulmonary vascularity are normal. Aorta is somewhat tortuous with atherosclerotic calcification present. No adenopathy. There is degenerative change in the thoracic spine. IMPRESSION: No edema or consolidation. Aorta tortuous with aortic atherosclerosis. No adenopathy evident. Electronically Signed   By: Lowella Grip III M.D.   On: 05/10/2016 13:49    EKG: Independently reviewed.   EKG Interpretation  Date/Time:  Wednesday May 10 2016 12:26:20 EDT Ventricular Rate:  72 PR Interval:    QRS Duration: 92 QT Interval:  372 QTC Calculation: 408 R Axis:   32 Text Interpretation:  Sinus rhythm No significant change since last tracing Confirmed by FLOYD MD, Quillian Quince [id number]) on 05/10/2016 12:28:57 PM      Assessment/Plan   Active Problems:   CAD S/P percutaneous coronary angioplasty   Hypertension, essential   OSA on CPAP   Neoplasm of left kidney   AKI (acute kidney injury) ([place])   Loss of balance   Type 2 diabetes mellitus ([place])      Episode of disorientation / severe posterior left sided headache / diaphoresis 2 days ago. Now with some "left sided dizziness" affecting balance. TIA? Stroke seems unlikely as patient is on supposedly on Brilinta (pharmacy to confirm). Possibly hypoglycemic episode, took HS insulin but didn't eat breakfast. ? Dehydration - symptoms occurred on hot day. No further episodes of confusion per wife but still with some balances issues -Place in Observation - tele -IV hydration -Neurology evaluation -Neuro checks Q4 hours x 3 -Head CTscan now, may need MRI if negative. Discussed MRI with patient, he will require sedation for claustrophobia.  -follow up on u/a -PT evaluation for loss of balance  Mildly elevated lactic acid.  -trend levels, suspect improvement with IVF  Dyspnea on exertion x 2-3 days. He is post-op so PE consideration but seems unlikely on Brillinta. No acute CXR findings  -  VQ  scan -echocardiogram -BNP -Intake and output  Left sided chest pain, chronic intermittent and unchanged. Sometimes radiates to left upper back. Followed by Dr. Ellyn Hack -  Myoview 08/03/2015 at [facility]: [place]. EF 57%. 10.4 METS low risk scan no ischemia. No wall motion abnormality. POC trop negative. No acute EKG findings  -monitor of Telemetry -cycle troponins  AKI. Baseline Cr 1.13. AKI post left Nephrectomy early June (Cr 1.79), now with progressive AKI. Cr 2.6 -Suspect volume depletion may be contributing factor -renal ultrasound -I+0  Peripheral edema. Distal LLE edematous and erythematous. Doppler negative.   HTN. Controlled           -Hold home anti-hypertensive until CVA confidently excluded  Ascending aortic aneurysm , 4.5 cm on CTscan in March 2017.   DM2, uncontrolled. A1c early June 9.5 -hold home oral diabetic agents -continue home [place], SSI  OSA.  -Cpap  CAD / PCI 2011.  On Brillinta -continue Brillinta  Dysplipidemia.    -continue home tricor  Gout -continue zyloprim  DVT prophylaxis:  Anti-coagulated at home with Brillinta  Code Status:   Full code  Family Communication:  Treatment plan discussed with wife in room. She understands and agrees with plans.  Disposition Plan: Discharge home in 24-48 hours              Consults called:    None Admission status:  Observation -  telemetry    Tye Savoy NP [facility] Pager [phone]  If 7PM-7AM, please contact night-coverage [email or web address] Password [email or web address]  05/10/2016, 2:15 PM

## 2016-05-10 NOTE — Progress Notes (Signed)
Call from RN. Trop 0.03. Patient has AKI so doubt clinically significant. His EKG was unremarkable in ED and no complaints of chest pain. Due for one more troponin in 6 hours.

## 2016-05-10 NOTE — Progress Notes (Signed)
  Echocardiogram 2D Echocardiogram has been performed.  Jacob Berger 05/10/2016, 5:32 PM

## 2016-05-10 NOTE — Progress Notes (Signed)
CRITICAL VALUE ALERT  Critical value received:  Trop 0.03  Date of notification:  05/10/16  Time of notification:  [id number]  Critical value read back:Yes.    Nurse who received alert:  Dwaine Gale, RN  MD notified (1st page):  Tye Savoy, NP  Time of first page:  1731  MD notified (2nd page): Tye Savoy, NP  Time of second page: 1743  Responding MD:  Tye Savoy, NP  Time MD responded:  1744   Joellen Jersey, RN.

## 2016-05-10 NOTE — Progress Notes (Signed)
Preliminary results by tech - Venous Duplex Lower Ext. Left Completed. Negative for deep and superficial vein thrombosis in the left leg. Jolaine Fryberger, BS, RDMS, RVT  

## 2016-05-10 NOTE — ED Provider Notes (Signed)
CSN: [id number]     Arrival date & time 05/10/16  1159 History   First MD Initiated Contact with Patient 05/10/16 1211     Chief Complaint  Patient presents with  . Chills     (Consider location/radiation/quality/duration/timing/severity/associated sxs/prior Treatment) Patient is a 70 y.o. male presenting with general illness. The history is provided by the patient.  Illness Severity:  Moderate Onset quality:  Sudden Duration:  3 days Timing:  Constant Progression:  Partially resolved Chronicity:  New Associated symptoms: no abdominal pain, no chest pain, no congestion, no diarrhea, no fever, no headaches, no myalgias, no rash, no shortness of breath and no vomiting    70 yo M with confusion, occurred three days ago after a heat Exposure.  Having some chest discomfort since then. He has difficulty describing this. Denies radiation, fevers.  Has been feeling a bit weak is a well-developed lightheaded. Has been ill exercise without too much difficulty. Having some pain to the left lower extremity and some swelling. Denies any flank pain denies vomiting denies diarrhea.   Past Medical History  Diagnosis Date  . CAD S/P percutaneous coronary angioplasty 2011    PCI of PDA Endeavor 2.5 mm x 12 mm DES - [place] [facility]  . Obesity, Class II, BMI 35-39.9, with comorbidity (HCC)     BMI 37  . Hypertension, essential   . Diabetes mellitus, type II, insulin dependent (HCC)     No longer on insulin according to his current med list   . Dyslipidemia, goal LDL below 70   . Gout   . Glaucoma   . OSA on CPAP   . Cancer [facility]) 2007    prostate  . Complication of anesthesia   . PONV (postoperative nausea and vomiting)     ponv after  colonscopy  . Rash since 04-08-16    on neck healing, right arm improving  . Thoracic aortic aneurysm without rupture ([place]) - Noted on chest CT. 4.5 cm 02/15/2016    4.5 cm per dr Cyndia Bent note and [facility] ct 01-18-16   Past Surgical History   Procedure Laterality Date  . Prostatectomy  1995  . Nm myoview ltd  June 2013; Sep 2016    a. Treadmill Myoview: 10 minutes, 12 METS; no ischemia infarction, EF 65%; b. [facility]: LOW RISK. EF 57%. 10.4 METS low risk scan no ischemia. No wall motion abnormality.   . Prostatectomy  2007  . Coronary angioplasty with stent placement  2001    [place]: Endeavor 2.5 mm x 12 mm DES - distal PDA ([facility])  . Colonscopy  yrs ago    x 3 maybe more  . Laparoscopic nephrectomy Left 04/13/2016    Procedure: LAPAROSCOPIC RADICAL LEFT NEPHRECTOMY;  Surgeon: Raynelle Bring, MD;  Location: [facility];  Service: Urology;  Laterality: Left;   Family History  Problem Relation Age of Onset  . Family history unknown: Yes   Social History  Substance Use Topics  . Smoking status: Former Smoker    Types: Cigarettes    Quit date: 06/04/1993  . Smokeless tobacco: Never Used     Comment: A PK WOULD LAST A WEEK  . Alcohol Use: 0.0 oz/week    0 Standard drinks or equivalent per week     Comment: occ wine or beer    Review of Systems  Constitutional: Negative for fever and chills.  HENT: Negative for congestion and facial swelling.   Eyes: Negative for discharge and  visual disturbance.  Respiratory: Negative for shortness of breath.   Cardiovascular: Negative for chest pain and palpitations.  Gastrointestinal: Negative for vomiting, abdominal pain and diarrhea.  Musculoskeletal: Negative for myalgias and arthralgias.  Skin: Negative for color change and rash.  Neurological: Negative for tremors, syncope and headaches.  Psychiatric/Behavioral: Negative for confusion and dysphoric mood.      Allergies  Accupril  Home Medications   Prior to Admission medications   Medication Sig Start Date End Date Taking? Authorizing Provider  Albiglutide (TANZEUM) 50 MG PEN Inject 50 mg into the skin every Thursday.     Historical Provider, MD  allopurinol (ZYLOPRIM) 300 MG tablet Take 300 mg by mouth  daily.    Historical Provider, MD  AZOR 10-40 MG per tablet TAKE 1 TABLET BY MOUTH DAILY 12/18/13   Leonie Man, MD  B-D ULTRAFINE III SHORT PEN 31G X 8 MM MISC  02/23/15   Historical Provider, MD  betamethasone valerate (VALISONE) 0.1 % cream Apply 1 application topically daily.  04/01/15   Historical Provider, MD  diphenhydrAMINE (BENADRYL) 25 mg capsule Take 25 mg by mouth every 6 (six) hours as needed for itching.    Historical Provider, MD  dorzolamide-timolol (COSOPT) 22.3-6.8 MG/ML ophthalmic solution Place 1 drop into both eyes 2 (two) times daily.    Historical Provider, MD  fenofibrate (TRICOR) 145 MG tablet Take 145 mg by mouth daily.    Historical Provider, MD  furosemide (LASIX) 40 MG tablet Take 20-40 mg by mouth daily as needed for fluid or edema.     Historical Provider, MD  HYDROcodone-acetaminophen (NORCO) 5-325 MG tablet Take 1-2 tablets by mouth every 6 (six) hours as needed for moderate pain. 04/13/16   Amanda Dancy, PA-C  LEVEMIR FLEXTOUCH 100 UNIT/ML Pen Inject 50 Units into the skin at bedtime. 01/04/16   Historical Provider, MD  metFORMIN (GLUCOPHAGE) 500 MG tablet Take 1,000 mg by mouth 2 (two) times daily with a meal.     Historical Provider, MD  Methylcellulose, Laxative, (CITRUCEL PO) Take 1 tablet by mouth daily.    Historical Provider, MD  metoprolol succinate (TOPROL-XL) 100 MG 24 hr tablet Take 1 tablet (100 mg total) by mouth daily. 12/31/15   Leonie Man, MD  travoprost, benzalkonium, (TRAVATAN) 0.004 % ophthalmic solution Place 1 drop into both eyes at bedtime.    Historical Provider, MD  triamcinolone cream (KENALOG) 0.1 % Apply 1 application topically 3 (three) times daily as needed (Apply to affected area.).  04/09/15   Historical Provider, MD  VYTORIN 10-40 MG per tablet Take 1 tablet by mouth daily at 6 PM.  04/27/15   Historical Provider, MD   BP 130/75 mmHg  Pulse 76  Temp(Src) 98.8 F (37.1 C) (Oral)  Resp 16  Wt 242 lb 9.6 oz (110.043 kg)  SpO2  100% Physical Exam  Constitutional: He is oriented to person, place, and time. He appears well-developed and well-nourished.  HENT:  Head: Normocephalic and atraumatic.  Eyes: EOM are normal. Pupils are equal, round, and reactive to light.  Neck: Normal range of motion. Neck supple. No JVD present.  Cardiovascular: Normal rate and regular rhythm.  Exam reveals no gallop and no friction rub.   No murmur heard. Pulmonary/Chest: No respiratory distress. He has no wheezes.  Abdominal: He exhibits no distension. There is no rebound and no guarding.  Musculoskeletal: Normal range of motion.  Neurological: He is alert and oriented to person, place, and time.  Skin: No rash noted.  No pallor.  Psychiatric: He has a normal mood and affect. His behavior is normal.  Nursing note and vitals reviewed.   ED Course  Procedures (including critical care time) Labs Review Labs Reviewed  COMPREHENSIVE METABOLIC PANEL - Abnormal; Notable for the following:    CO2 20 (*)    Glucose, Bld 235 (*)    BUN 40 (*)    Creatinine, Ser 2.48 (*)    Total Protein 8.5 (*)    GFR calc non Af Amer 25 (*)    GFR calc Af Amer 29 (*)    All other components within normal limits  CBC WITH DIFFERENTIAL/PLATELET - Abnormal; Notable for the following:    Hemoglobin 12.3 (*)    HCT 36.7 (*)    All other components within normal limits  LIPASE, BLOOD - Abnormal; Notable for the following:    Lipase 65 (*)    All other components within normal limits  I-STAT CG4 LACTIC ACID, ED - Abnormal; Notable for the following:    Lactic Acid, Venous 2.04 (*)    All other components within normal limits  I-STAT CHEM 8, ED - Abnormal; Notable for the following:    BUN 41 (*)    Creatinine, Ser 2.60 (*)    Glucose, Bld 232 (*)    All other components within normal limits  BRAIN NATRIURETIC PEPTIDE  URINALYSIS, ROUTINE W REFLEX MICROSCOPIC (NOT AT [facility])  I-STAT TROPOININ, ED  I-STAT CG4 LACTIC ACID, ED    Imaging Review Dg  Chest 2 View  05/10/2016  CLINICAL DATA:  Diaphoresis and altered mental status. EXAM: CHEST  2 VIEW COMPARISON:  Chest radiograph November 15, 2015; chest CT January 18, 2016 FINDINGS: There is no edema or consolidation. Heart size and pulmonary vascularity are normal. Aorta is somewhat tortuous with atherosclerotic calcification present. No adenopathy. There is degenerative change in the thoracic spine. IMPRESSION: No edema or consolidation. Aorta tortuous with aortic atherosclerosis. No adenopathy evident. Electronically Signed   By: Lowella Grip III M.D.   On: 05/10/2016 13:49   I have personally reviewed and evaluated these images and lab results as part of my medical decision-making.   EKG Interpretation   Date/Time:  Wednesday May 10 2016 12:26:20 EDT Ventricular Rate:  72 PR Interval:    QRS Duration: 92 QT Interval:  372 QTC Calculation: 408 R Axis:   32 Text Interpretation:  Sinus rhythm No significant change since last  tracing Confirmed by Trudie Cervantes MD, Quillian Quince [id number]) on 05/10/2016 12:28:57 PM      MDM   Final diagnoses:  Leg pain  AKI (acute kidney injury) ([place])    70 yo M with possible heat exposure three days ago, now having chest discomfort. Also feeling a bit lightheaded when he moves around. Has been able to exercise. Patient has left lower extremity edema greater than the right. Some mild erythema as well. This could be cellulitis versus DVT as the patient recently had surgery. With his chest discomfort and mild lightheadedness there is some concern for PE as the patient was recently operated on. He has no exertional symptoms is not tachycardic not hypoxic. I am unable to obtain a CT angiogram secondary to acute kidney injury. 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  With one kidney and creatinine bump feel patient would benefit from gently IV fluids, obs, will discuss with hospitalist.    The patients results and plan were reviewed and discussed.    Any x-rays  performed were independently reviewed by myself.   Differential diagnosis were considered with the presenting HPI.  Medications  0.9 %  sodium chloride infusion (not administered)  fenofibrate tablet 54 mg (not administered)  travoprost (benzalkonium) (TRAVATAN) 0.004 % ophthalmic solution 1 drop (not administered)  diphenhydrAMINE (BENADRYL) capsule 25 mg (not administered)  allopurinol (ZYLOPRIM) tablet 300 mg (not administered)  dorzolamide-timolol (COSOPT) 22.3-6.8 MG/ML ophthalmic solution 1 drop (not administered)  ezetimibe-simvastatin (VYTORIN) 10-40 MG per tablet 1 tablet (not administered)  polyethylene glycol (MIRALAX / GLYCOLAX) packet 17 g (not administered)  HYDROcodone-acetaminophen (NORCO/VICODIN) 5-325 MG per tablet 1-2 tablet (not administered)    Filed Vitals:   05/10/16 1203 05/10/16 1210 05/10/16 1230 05/10/16 1315  BP: 132/66  112/73 130/75  Pulse: 72  75 76  Temp:  98.8 F (37.1 C)    TempSrc:  Oral    Resp: 16  18 16   Weight: 242 lb 9.6 oz (110.043 kg)     SpO2: 100%  99% 100%    Final diagnoses:  Leg pain  AKI (acute kidney injury) ([place])    Admission/ observation were discussed with the admitting physician, patient and/or family and they are comfortable with the plan.    Deno Etienne, DO 05/10/16 [phone]

## 2016-05-11 ENCOUNTER — Observation Stay (HOSPITAL_COMMUNITY): Payer: Medicare Other

## 2016-05-11 ENCOUNTER — Observation Stay (HOSPITAL_BASED_OUTPATIENT_CLINIC_OR_DEPARTMENT_OTHER): Payer: Medicare Other

## 2016-05-11 DIAGNOSIS — R55 Syncope and collapse: Secondary | ICD-10-CM | POA: Diagnosis not present

## 2016-05-11 DIAGNOSIS — R2990 Unspecified symptoms and signs involving the nervous system: Secondary | ICD-10-CM

## 2016-05-11 DIAGNOSIS — L03116 Cellulitis of left lower limb: Secondary | ICD-10-CM | POA: Diagnosis not present

## 2016-05-11 DIAGNOSIS — N179 Acute kidney failure, unspecified: Secondary | ICD-10-CM | POA: Diagnosis not present

## 2016-05-11 LAB — LACTIC ACID, PLASMA: Lactic Acid, Venous: 1.9 mmol/L (ref 0.5–1.9)

## 2016-05-11 LAB — VAS US CAROTID
LCCAPSYS: 173 cm/s
LEFT ECA DIAS: -7 cm/s
LEFT VERTEBRAL DIAS: -15 cm/s
LICADDIAS: -17 cm/s
LICADSYS: -62 cm/s
LICAPSYS: -52 cm/s
Left CCA dist dias: -18 cm/s
Left CCA dist sys: -111 cm/s
Left CCA prox dias: 14 cm/s
Left ICA prox dias: -15 cm/s
RCCADSYS: -98 cm/s
RIGHT ECA DIAS: -6 cm/s
RIGHT VERTEBRAL DIAS: -12 cm/s
Right CCA prox dias: 10 cm/s
Right CCA prox sys: 127 cm/s

## 2016-05-11 LAB — BASIC METABOLIC PANEL
ANION GAP: 8 (ref 5–15)
BUN: 38 mg/dL — ABNORMAL HIGH (ref 6–20)
CALCIUM: 9.7 mg/dL (ref 8.9–10.3)
CHLORIDE: 108 mmol/L (ref 101–111)
CO2: 22 mmol/L (ref 22–32)
Creatinine, Ser: 2.26 mg/dL — ABNORMAL HIGH (ref 0.61–1.24)
GFR calc non Af Amer: 28 mL/min — ABNORMAL LOW (ref >60)
GFR, EST AFRICAN AMERICAN: 32 mL/min — AB (ref >60)
Glucose, Bld: 170 mg/dL — ABNORMAL HIGH (ref 65–99)
Potassium: 3.4 mmol/L — ABNORMAL LOW (ref 3.5–5.1)
Sodium: 138 mmol/L (ref 135–145)

## 2016-05-11 LAB — CBC
HCT: 33.3 % — ABNORMAL LOW (ref 39.0–52.0)
HEMOGLOBIN: 11 g/dL — AB (ref 13.0–17.0)
MCH: 28.1 pg (ref 26.0–34.0)
MCHC: 33 g/dL (ref 30.0–36.0)
MCV: 84.9 fL (ref 78.0–100.0)
Platelets: 182 10*3/uL (ref 150–400)
RBC: 3.92 MIL/uL — AB (ref 4.22–5.81)
RDW: 14.5 % (ref 11.5–15.5)
WBC: 5.8 10*3/uL (ref 4.0–10.5)

## 2016-05-11 LAB — GLUCOSE, CAPILLARY
GLUCOSE-CAPILLARY: 181 mg/dL — AB (ref 65–99)
GLUCOSE-CAPILLARY: 250 mg/dL — AB (ref 65–99)
GLUCOSE-CAPILLARY: 153 mg/dL — AB (ref 65–99)
GLUCOSE-CAPILLARY: 162 mg/dL — AB (ref 65–99)

## 2016-05-11 LAB — TROPONIN I

## 2016-05-11 MED ORDER — METOPROLOL SUCCINATE ER 50 MG PO TB24
50.0000 mg | ORAL_TABLET | Freq: Every day | ORAL | Status: DC
  Administered 2016-05-11 – 2016-05-12 (×2): 50 mg via ORAL
  Filled 2016-05-11 (×2): qty 1

## 2016-05-11 MED ORDER — INSULIN DETEMIR 100 UNIT/ML FLEXPEN: 20.0000 [IU] | PEN_INJECTOR | Freq: Every day | SUBCUTANEOUS | Status: DC

## 2016-05-11 MED ORDER — LORAZEPAM 2 MG/ML IJ SOLN
0.5000 mg | Freq: Once | INTRAMUSCULAR | Status: AC
  Administered 2016-05-11: 0.5 mg via INTRAVENOUS

## 2016-05-11 MED ORDER — INSULIN DETEMIR 100 UNIT/ML ~~LOC~~ SOLN
20.0000 [IU] | Freq: Every day | SUBCUTANEOUS | Status: DC
  Administered 2016-05-11: 20 [IU] via SUBCUTANEOUS
  Filled 2016-05-11 (×2): qty 0.2

## 2016-05-11 MED ORDER — LORAZEPAM 2 MG/ML IJ SOLN
INTRAMUSCULAR | Status: AC
  Filled 2016-05-11: qty 1

## 2016-05-11 MED ORDER — FUROSEMIDE 20 MG PO TABS
20.0000 mg | ORAL_TABLET | Freq: Every day | ORAL | Status: DC
  Administered 2016-05-11 – 2016-05-12 (×2): 20 mg via ORAL
  Filled 2016-05-11 (×2): qty 1

## 2016-05-11 NOTE — Evaluation (Signed)
Physical Therapy Evaluation Patient Details Name: Jacob Berger MRN: [id number] DOB: [birth year] Today's Date: 05/11/2016   History of Present Illness  70 y.o. male with known DM2, HTN, and RCC s/p left nephrectomy early June, presented to [facility] ED 05/10/16  with sudden onset of confusion and left sided headache, unsteady gait.CT head only with chronic small vessel changes. MRI pending.  Clinical Impression  The patient ambulated x 125' without assistance. Gait is slow and steady. The patient c/o discomfort of the lower Left leg. Noted to have area that is reddened and mildly edematous. Pt admitted with above diagnosis. Pt currently with functional limitations due to the deficits listed below (see PT Problem List).  Pt will benefit from skilled PT to increase their independence and safety with mobility to allow discharge to the venue listed below.        Follow Up Recommendations No PT follow up    Equipment Recommendations  None recommended by PT    Recommendations for Other Services       Precautions / Restrictions Precautions Precautions: Fall      Mobility  Bed Mobility Overal bed mobility: Independent                Transfers Overall transfer level: Needs assistance   Transfers: Sit to/from Stand Sit to Stand: Supervision         General transfer comment: no assistance required  Ambulation/Gait Ambulation/Gait assistance: Supervision Ambulation Distance (Feet): 50 Feet (with RW) Assistive device: Rolling walker (2 wheeled);None Gait Pattern/deviations: Step-through pattern;WFL(Within Functional Limits) Gait velocity: decreased   General Gait Details: then ambulated x 125' withou RW ans supervision. pace is slow and steady  Financial trader Rankin (Stroke Patients Only)       Balance Overall balance assessment: Needs assistance Sitting-balance support: Feet supported;No upper extremity supported Sitting  balance-Leahy Scale: Normal     Standing balance support: No upper extremity supported Standing balance-Leahy Scale: Good                               Pertinent Vitals/Pain Pain Assessment: 0-10 Pain Score: 4  Pain Location: Left lower leg Pain Descriptors / Indicators: Discomfort;Tender;Tightness Pain Intervention(s): Limited activity within patient's tolerance;Monitored during session    Home Living Family/patient expects to be discharged to:: Private residence Living Arrangements: Spouse/significant other Available Help at Discharge: Family Type of Home: House Home Access: Stairs to enter Entrance Stairs-Rails: Psychiatric nurse of Steps: 6 Home Layout: One level Home Equipment: None      Prior Function Level of Independence: Independent               Hand Dominance        Extremity/Trunk Assessment   Upper Extremity Assessment: Overall WFL for tasks assessed           Lower Extremity Assessment: Overall WFL for tasks assessed      Cervical / Trunk Assessment: Normal  Communication   Communication: No difficulties  Cognition Arousal/Alertness: Awake/alert Behavior During Therapy: WFL for tasks assessed/performed Overall Cognitive Status: Within Functional Limits for tasks assessed                      General Comments      Exercises        Assessment/Plan    PT Assessment Patient needs continued PT services  PT Diagnosis Difficulty walking   PT Problem List Decreased activity tolerance;Decreased mobility;Pain  PT Treatment Interventions Gait training;Functional mobility training;Therapeutic activities;Patient/family education   PT Goals (Current goals can be found in the Care Plan section) Acute Rehab PT Goals Patient Stated Goal: to walk more, go home PT Goal Formulation: With patient Time For Goal Achievement: 05/25/16 Potential to Achieve Goals: Good    Frequency Min 3X/week   Barriers to  discharge        Co-evaluation               End of Session Equipment Utilized During Treatment: Gait belt Activity Tolerance: Patient tolerated treatment well Patient left: in chair;with call bell/phone within reach Nurse Communication: Mobility status    Functional Assessment Tool Used: clinical judgement Functional Limitation: Mobility: Walking and moving around Mobility: Walking and Moving Around Current Status [phone]): At least 1 percent but less than 20 percent impaired, limited or restricted Mobility: Walking and Moving Around Goal Status [phone]): 0 percent impaired, limited or restricted    Time: 1217-1245 PT Time Calculation (min) (ACUTE ONLY): 28 min   Charges:   PT Evaluation $PT Eval Low Complexity: 1 Procedure PT Treatments $Gait Training: 8-22 mins   PT G Codes:   PT G-Codes **NOT FOR INPATIENT CLASS** Functional Assessment Tool Used: clinical judgement Functional Limitation: Mobility: Walking and moving around Mobility: Walking and Moving Around Current Status [id number]): At least 1 percent but less than 20 percent impaired, limited or restricted Mobility: Walking and Moving Around Goal Status [phone]): 0 percent impaired, limited or restricted    Claretha Cooper 05/11/2016, 1:01 PM  Tresa Endo PT [phone]

## 2016-05-11 NOTE — Progress Notes (Addendum)
Patient ID: Jacob Berger, male   DOB: Oct 13, 1946, 70 y.o.   MRN: [id number]    PROGRESS NOTE    Jacob Berger  [id number] DOB: March 01, 1946 DOA: 05/10/2016  PCP: Dwan Bolt, MD   Brief Narrative:  70 y.o. male with known DM2, HTN, and RCC s/p left nephrectomy early June, presented to [facility] ED with main concern of sudden onset of confusion and left sided headache, unsteady gait. There was question about chest pain, pt explains he has intermittent and chronic chest pain but none since arrival to the ED.   Assessment & Plan:  Confusion, left sided headache, unsteady gait - unclear etiology, suspect component of dehydration with elevated Cr above pt's baseline and on medications lasix and metformin - CT head only with chronic small vessel changes - V/Q scan negative for PE - MRI brain pending  - will check orthostatic vitals, PT evaluation requested   Mildly elevated lactic acid, mild LLE cellulitis - suspect component of cellulitis - continue doxycycline day #2  - repeat lactic acid to ensure clearance   Dyspnea on exertion, acute on chronic diastolic CHF, NYHA class II - V/Q scan negative for PE  - ECHO with normal EF and grade I diastolic CHF - pt with some LE edema + 1 but no crackles on exam, weight is up from 242 --> 244 lbs  - stop IVF and start low dose Lasix 20 mg PO QD - monitor daily weights, strict I/O  Left sided chest pain - Followed by Dr. Ellyn Hack  - Myoview 08/03/2015 at [facility]: [place]. EF 57%. 10.4 METS low risk scan no ischemia. No wall motion abnormality.  - No acute EKG findings  - troponins mildly elevated, flat, now WNL - no chest pain this AM  Hypokalemia - mild, supplement, repeat BMP In AM  AKI - Baseline Cr 1.13. - AKI post left Nephrectomy early June (Cr 1.79), now with progressive AKI. Cr 2.6 - was given IVF for suspected dehydration but now with more LE edema and weight up - renal ultrasound with no acute abnormalities, ? Small lesions,  needs outpatient follow up recommended - stop Metformin, stop IVF  - repeat BMP in AM  HTN, essential  - reasonable inpatient control   Ascending aortic aneurysm  - 4.5 cm on CTscan in March 2017.   DM2, uncontrolled. A1c early June 9.5 with complications of nephropathy  - holding home oral diabetic agents - continue home regimen with insulin   OSA - CPAP  CAD / PCI 2011 - continue Brillinta  Dysplipidemia.  - continue home tricor  Gout - continue zyloprim  Obesity - Body mass index is 32.2 kg/(m^2)   DVT prophylaxis: SCD  Code Status: Full Family Communication: Patient at bedside  Disposition Plan: Home in 1-2 in AM  Consultants:   Neurology   Procedures:   None  Antimicrobials:   Doxycycline 7/5 -->   Subjective: Reports feeling better. Still with left sided headache, 3/10.  Objective: Filed Vitals:   05/10/16 2100 05/10/16 2235 05/11/16 0630 05/11/16 0800  BP:  121/65 122/61 128/66  Pulse:  72 78 74  Temp:  99.2 F (37.3 C) 98.4 F (36.9 C) 99.6 F (37.6 C)  TempSrc:  Oral Oral Oral  Resp:  20 16 17   Height:      Weight: 110.678 kg (244 lb)     SpO2:  99% 100% 100%    Intake/Output Summary (Last 24 hours) at 05/11/16 1121 Last data filed at 05/11/16 1031  Gross per 24 hour  Intake    840 ml  Output    500 ml  Net    340 ml   Filed Weights   05/10/16 1203 05/10/16 1530 05/10/16 2100  Weight: 110.043 kg (242 lb 9.6 oz) 110 kg (242 lb 8.1 oz) 110.678 kg (244 lb)   Examination:  General exam: Appears calm and comfortable  Respiratory system: Respiratory effort normal. Diminished sounds at bases  Cardiovascular system: S1 & S2 heard, RRR. No JVD, murmurs, rubs, gallops or clicks. +1 bilateral LE edema  Gastrointestinal system: Abdomen is nondistended, soft and nontender. No organomegaly or masses felt.  Central nervous system: Alert and oriented. No focal neurological deficits. Extremities: Symmetric 5 x 5 power. LLE with mild  erythema and warmth to touch   Data Reviewed: I have personally reviewed following labs and imaging studies  CBC:  Recent Labs Lab 05/10/16 1208 05/10/16 1255 05/11/16 0315  WBC 7.2  --  5.8  NEUTROABS 4.5  --   --   HGB 12.3* 13.3 11.0*  HCT 36.7* 39.0 33.3*  MCV 84.8  --  84.9  PLT 214  --  [id number]   Basic Metabolic Panel:  Recent Labs Lab 05/10/16 1208 05/10/16 1255 05/11/16 0315  NA 136 139 138  K 3.6 3.7 3.4*  CL 104 104 108  CO2 20*  --  22  GLUCOSE 235* 232* 170*  BUN 40* 41* 38*  CREATININE 2.48* 2.60* 2.26*  CALCIUM 10.2  --  9.7   Liver Function Tests:  Recent Labs Lab 05/10/16 1208  AST 26  ALT 20  ALKPHOS 75  BILITOT 0.5  PROT 8.5*  ALBUMIN 4.1    Recent Labs Lab 05/10/16 1208  LIPASE 65*   Cardiac Enzymes:  Recent Labs Lab 05/10/16 1551 05/10/16 2218 05/11/16 0315  TROPONINI 0.03* 0.05* <0.03   CBG:  Recent Labs Lab 05/10/16 1530 05/11/16 0824  GLUCAP 118* 181*   Urine analysis:    Component Value Date/Time   COLORURINE YELLOW 05/10/2016 1929   APPEARANCEUR CLEAR 05/10/2016 1929   LABSPEC 1.015 05/10/2016 [place] 5.0 05/10/2016 1929   GLUCOSEU NEGATIVE 05/10/2016 1929   HGBUR NEGATIVE 05/10/2016 [place] NEGATIVE 05/10/2016 [place] NEGATIVE 05/10/2016 1929   PROTEINUR NEGATIVE 05/10/2016 1929   NITRITE NEGATIVE 05/10/2016 1929   LEUKOCYTESUR NEGATIVE 05/10/2016 1929   Radiology Studies: Dg Chest 2 View  05/10/2016  CLINICAL DATA:  Diaphoresis and altered mental status. EXAM: CHEST  2 VIEW COMPARISON:  Chest radiograph November 15, 2015; chest CT January 18, 2016 FINDINGS: There is no edema or consolidation. Heart size and pulmonary vascularity are normal. Aorta is somewhat tortuous with atherosclerotic calcification present. No adenopathy. There is degenerative change in the thoracic spine. IMPRESSION: No edema or consolidation. Aorta tortuous with aortic atherosclerosis. No adenopathy evident.  Electronically Signed   By: Lowella Grip III M.D.   On: 05/10/2016 13:49   Ct Head Wo Contrast  05/10/2016  CLINICAL DATA:  Loss of balance. Forgetfulness. Symptoms worsening recently. EXAM: CT HEAD WITHOUT CONTRAST TECHNIQUE: Contiguous axial images were obtained from the base of the skull through the vertex without intravenous contrast. COMPARISON:  08/27/2004 FINDINGS: The brain does not show advanced degree of atrophy for a person of this age. There are mild to moderate chronic small-vessel ischemic changes of the cerebral hemispheric white matter. No sign of acute infarction, mass lesion, hemorrhage, hydrocephalus or extra-axial collection. No calvarial abnormality. Sinuses are clear. There  is atherosclerotic calcification of the major vessels at the base of the brain. IMPRESSION: No acute finding. Mild age related volume loss. Mild to moderate chronic small-vessel ischemic changes of the cerebral hemispheric white matter. Electronically Signed   By: Nelson Chimes M.D.   On: 05/10/2016 17:49   US Renal  05/10/2016  CLINICAL DATA:  Acute kidney injury ; history prostate cancer, LEFT nephrectomy June 2017 EXAM: RENAL / URINARY TRACT ULTRASOUND COMPLETE COMPARISON:  CT abdomen and pelvis 12/01/2015, MR abdomen 12/23/2015 FINDINGS: Right Kidney: Length: 13.4 cm. Normal cortical thickness and echogenicity. Two foci of slightly increased cortical echogenicity at the mid to inferior pole region, measuring 13 mm and 12 mm in greatest size. These are of uncertain significance, with no definite lesions seen at this site on prior CT or MR exams. No hydronephrosis or shadowing calcification. Left Kidney: Surgically absent. No gross evidence of mass at the LEFT renal fossa. Bladder: Contains minimal urine, grossly unremarkable IMPRESSION: Post LEFT nephrectomy. Two foci of slightly increased cortical echogenicity at mid to inferior RIGHT kidney, of uncertain significance, with no definite lesions seen at these sites  on prior CT or MR exams; followup CT imaging with and without contrast recommended to exclude developing mass lesions in the RIGHT kidney. Electronically Signed   By: Lavonia Dana M.D.   On: 05/10/2016 20:24   Nm Pulmonary Perf And Vent  05/10/2016  CLINICAL DATA:  Dyspnea EXAM: NUCLEAR MEDICINE VENTILATION - PERFUSION LUNG SCAN TECHNIQUE: Ventilation images were obtained in multiple projections using inhaled aerosol Tc-24m DTPA. Perfusion images were obtained in multiple projections after intravenous injection of Tc-64m MAA. RADIOPHARMACEUTICALS:  31.3 mCi Technetium-51m DTPA aerosol inhalation and 4.06 mCi Technetium-44m MAA IV COMPARISON:  None Correlation: Chest radiograph 05/10/2016 FINDINGS: Ventilation: Normal Perfusion: Normal Chest radiograph: Lungs clear. IMPRESSION: Normal ventilation and perfusion lung scan. Electronically Signed   By: Lavonia Dana M.D.   On: 05/10/2016 19:02      Scheduled Meds: . allopurinol  300 mg Oral Daily  . dorzolamide-timolol  1 drop Both Eyes BID  . doxycycline  100 mg Oral Q12H  . ezetimibe-simvastatin  1 tablet Oral q1800  . fenofibrate  54 mg Oral Daily  . insulin aspart  0-9 Units Subcutaneous TID WC  . LORazepam  0.5 mg Intravenous Once  . polyethylene glycol  17 g Oral Daily  . sodium chloride flush  3 mL Intravenous Q12H  . ticagrelor  60 mg Oral BID  . Travoprost (BAK Free)  1 drop Both Eyes QHS   Continuous Infusions: . sodium chloride 100 mL/hr at 05/10/16 1539  . sodium chloride 75 mL/hr at 05/11/16 1032    Time spent: 20 minutes   Faye Ramsay, MD [facility] Pager [phone]  If 7PM-7AM, please contact night-coverage [email or web address] Password [email or web address] 05/11/2016, 11:21 AM

## 2016-05-11 NOTE — Progress Notes (Signed)
Pt has refused CPAP for the night.  RT to monitor and assess as needed.  

## 2016-05-11 NOTE — Progress Notes (Signed)
VASCULAR LAB PRELIMINARY  PRELIMINARY  PRELIMINARY  PRELIMINARY  Carotid duplex completed.    Preliminary report:  Bilateral:  1-39% ICA stenosis.  Vertebral artery flow is antegrade.     Tishia Maestre, RVS 05/11/2016, 2:42 PM

## 2016-05-12 DIAGNOSIS — L03116 Cellulitis of left lower limb: Secondary | ICD-10-CM | POA: Diagnosis not present

## 2016-05-12 DIAGNOSIS — N179 Acute kidney failure, unspecified: Secondary | ICD-10-CM | POA: Diagnosis not present

## 2016-05-12 LAB — CBC
HEMATOCRIT: 32.9 % — AB (ref 39.0–52.0)
Hemoglobin: 10.9 g/dL — ABNORMAL LOW (ref 13.0–17.0)
MCH: 28.2 pg (ref 26.0–34.0)
MCHC: 33.1 g/dL (ref 30.0–36.0)
MCV: 85 fL (ref 78.0–100.0)
PLATELETS: 177 10*3/uL (ref 150–400)
RBC: 3.87 MIL/uL — AB (ref 4.22–5.81)
RDW: 14.6 % (ref 11.5–15.5)
WBC: 6 10*3/uL (ref 4.0–10.5)

## 2016-05-12 LAB — BASIC METABOLIC PANEL
ANION GAP: 6 (ref 5–15)
BUN: 29 mg/dL — ABNORMAL HIGH (ref 6–20)
CALCIUM: 9.5 mg/dL (ref 8.9–10.3)
CO2: 24 mmol/L (ref 22–32)
Chloride: 110 mmol/L (ref 101–111)
Creatinine, Ser: 1.95 mg/dL — ABNORMAL HIGH (ref 0.61–1.24)
GFR, EST AFRICAN AMERICAN: 38 mL/min — AB (ref >60)
GFR, EST NON AFRICAN AMERICAN: 33 mL/min — AB (ref >60)
GLUCOSE: 166 mg/dL — AB (ref 65–99)
POTASSIUM: 3.9 mmol/L (ref 3.5–5.1)
Sodium: 140 mmol/L (ref 135–145)

## 2016-05-12 LAB — GLUCOSE, CAPILLARY
GLUCOSE-CAPILLARY: 147 mg/dL — AB (ref 65–99)
Glucose-Capillary: 190 mg/dL — ABNORMAL HIGH (ref 65–99)

## 2016-05-12 MED ORDER — ALBIGLUTIDE 50 MG ~~LOC~~ PEN: 50.0000 mg | PEN_INJECTOR | SUBCUTANEOUS | Status: DC

## 2016-05-12 MED ORDER — DOXYCYCLINE HYCLATE 100 MG PO TABS: 100.0000 mg | ORAL_TABLET | Freq: Two times a day (BID) | ORAL | Status: DC

## 2016-05-12 NOTE — Progress Notes (Signed)
Physical Therapy Treatment Patient Details Name: Jacob Berger MRN: [id number] DOB: 09/02/1946 Today's Date: 05/12/2016    History of Present Illness 70 y.o. male with known DM2, HTN, and RCC s/p left nephrectomy early June, presented to [facility] ED 05/10/16  with sudden onset of confusion and left sided headache, unsteady gait.CT head only with chronic small vessel changes    PT Comments    Noted MRI head negative for acute infarct. Pt reports balance is improved. He ambulated 200' without an assistive device, with no loss of balance with head turns, nor with fast stops. He is somewhat cautious with walking and reports this is due to a "sensation in his head".  He had HA earlier this morning which has resolved with Tylenol, 3/10 LLE pain with walking. From PT standpoint he is ready to DC home as he is independent with mobility.   Follow Up Recommendations  No PT follow up     Equipment Recommendations  None recommended by PT    Recommendations for Other Services       Precautions / Restrictions Precautions Precautions: Fall Restrictions Weight Bearing Restrictions: No    Mobility  Bed Mobility Overal bed mobility: Independent                Transfers Overall transfer level: Independent     Sit to Stand: Independent         General transfer comment: no assistance required  Ambulation/Gait Ambulation/Gait assistance: Independent Ambulation Distance (Feet): 200 Feet Assistive device: None Gait Pattern/deviations: WFL(Within Functional Limits) Gait velocity: decreased   General Gait Details: no LOB with head turns, nor with looking up/down, nor with fast stop   Stairs            Wheelchair Mobility    Modified Rankin (Stroke Patients Only)       Balance     Sitting balance-Leahy Scale: Normal       Standing balance-Leahy Scale: Good               High level balance activites: Sudden stops;Turns;Head turns High Level Balance Comments: no LOB  with 360* turn, standing with feet together, reaching forward over BOS in standing, sudden stop with walking, unable to maintain tandem stance without single UE support    Cognition Arousal/Alertness: Awake/alert Behavior During Therapy: WFL for tasks assessed/performed Overall Cognitive Status: Within Functional Limits for tasks assessed                      Exercises      General Comments        Pertinent Vitals/Pain Pain Score: 3  Pain Location: LLE Pain Descriptors / Indicators: Discomfort Pain Intervention(s): Monitored during session;Limited activity within patient's tolerance    Home Living                      Prior Function            PT Goals (current goals can now be found in the care plan section) Acute Rehab PT Goals Patient Stated Goal: to walk more, go home PT Goal Formulation: With patient Time For Goal Achievement: 05/25/16 Potential to Achieve Goals: Good Progress towards PT goals: Progressing toward goals    Frequency  Min 3X/week    PT Plan Current plan remains appropriate    Co-evaluation             End of Session Equipment Utilized During Treatment: Gait belt Activity Tolerance: Patient tolerated  treatment well Patient left: in chair;with call bell/phone within reach     Time: 0935-0948 PT Time Calculation (min) (ACUTE ONLY): 13 min  Charges:  $Gait Training: 8-22 mins                    G Codes:  Functional Assessment Tool Used: clinical judgement Mobility: Walking and Moving Around Discharge Status [phone]): 0 percent impaired, limited or restricted   Philomena Doheny 05/12/2016, 9:55 AM [phone]

## 2016-05-12 NOTE — Progress Notes (Signed)
Jacob Berger to be D/C'd Home per MD order.  Discussed prescriptions and follow up appointments with the patient. Prescriptions given to patient, medication list explained in detail. Pt verbalized understanding.    Medication List    STOP taking these medications        metFORMIN 500 MG tablet  Commonly known as:  GLUCOPHAGE      TAKE these medications        allopurinol 300 MG tablet  Commonly known as:  ZYLOPRIM  Take 300 mg by mouth 2 (two) times daily.     AZOR 10-40 MG tablet  Generic drug:  amLODipine-olmesartan  TAKE 1 TABLET BY MOUTH DAILY     B-D ULTRAFINE III SHORT PEN 31G X 8 MM Misc  Generic drug:  Insulin Pen Needle     BRILINTA 60 MG Tabs tablet  Generic drug:  ticagrelor  Take 60 mg by mouth 2 (two) times daily.     CITRUCEL PO  Take 1 tablet by mouth daily.     diphenhydrAMINE 25 mg capsule  Commonly known as:  BENADRYL  Take 25 mg by mouth every 6 (six) hours as needed for itching.     dorzolamide-timolol 22.3-6.8 MG/ML ophthalmic solution  Commonly known as:  COSOPT  Place 1 drop into both eyes 2 (two) times daily.     doxycycline 100 MG tablet  Commonly known as:  VIBRA-TABS  Take 1 tablet (100 mg total) by mouth every 12 (twelve) hours.     fenofibrate 145 MG tablet  Commonly known as:  TRICOR  Take 145 mg by mouth daily.     furosemide 40 MG tablet  Commonly known as:  LASIX  Take 20-40 mg by mouth daily as needed for fluid or edema.     HYDROcodone-acetaminophen 5-325 MG tablet  Commonly known as:  NORCO  Take 1-2 tablets by mouth every 6 (six) hours as needed for moderate pain.     LEVEMIR FLEXTOUCH 100 UNIT/ML Pen  Generic drug:  Insulin Detemir  Inject 40 Units into the skin at bedtime.     metoprolol succinate 100 MG 24 hr tablet  Commonly known as:  TOPROL-XL  Take 1 tablet (100 mg total) by mouth daily.     TANZEUM 50 MG Pen  Generic drug:  Albiglutide  Inject 50 mg into the skin every Thursday.     travoprost  (benzalkonium) 0.004 % ophthalmic solution  Commonly known as:  TRAVATAN  Place 1 drop into both eyes at bedtime.     VYTORIN 10-40 MG tablet  Generic drug:  ezetimibe-simvastatin  Take 1 tablet by mouth daily at 6 PM.        Filed Vitals:   05/12/16 0634 05/12/16 0948  BP: 123/69 112/57  Pulse: 60 66  Temp: 98 F (36.7 C) 97.8 F (36.6 C)  Resp: 17 18    Skin clean, dry and intact without evidence of skin break down, no evidence of skin tears noted. IV catheter discontinued intact. Site without signs and symptoms of complications. Dressing and pressure applied. Pt denies pain at this time. No complaints noted.  An After Visit Summary was printed and given to the patient. Patient escorted via [place], and D/C home via private auto.  Carole Civil RN [facility] Phone [phone]

## 2016-05-12 NOTE — Care Management Obs Status (Signed)
[place] [facility] NOTIFICATION   Patient Details  Name: Jacob Berger MRN: [id number] Date of Birth: Sep 22, 1946   Medicare Observation Status Notification Given:  Other (see comment) Unable to present due to pt leaving prior to this CM delivering OBS notificaton.    Yuleimy Kretz, Rory Percy, RN 05/12/2016, 3:58 PM

## 2016-05-12 NOTE — Discharge Instructions (Signed)
°Acute Kidney Injury °Acute kidney injury is any condition in which there is sudden (acute) damage to the kidneys. Acute kidney injury was previously known as acute kidney failure or acute renal failure. The kidneys are two organs that lie on either side of the spine between the middle of the back and the front of the abdomen. The kidneys: °· Remove wastes and extra water from the blood.   °· Produce important hormones. These help keep bones strong, regulate blood pressure, and help create red blood cells.   °· Balance the fluids and chemicals in the blood and tissues. °A small amount of kidney damage may not cause problems, but a large amount of damage may make it difficult or impossible for the kidneys to work the way they should. Acute kidney injury may develop into long-lasting (chronic) kidney disease. It may also develop into a life-threatening disease called end-stage kidney disease. Acute kidney injury can get worse very quickly, so it should be treated right away. Early treatment may prevent other kidney diseases from developing. °CAUSES  °· A problem with blood flow to the kidneys. This may be caused by:   °¨ Blood loss.   °¨ Heart disease.   °¨ Severe burns.   °¨ Liver disease. °· Direct damage to the kidneys. This may be caused by: °¨ Some medicines.   °¨ A kidney infection.   °¨ Poisoning or consuming toxic substances.   °¨ A surgical wound.   °¨ A blow to the kidney area.   °· A problem with urine flow. This may be caused by:   °¨ Cancer.   °¨ Kidney stones.   °¨ An enlarged prostate. °SIGNS AND SYMPTOMS  °· Swelling (edema) of the legs, ankles, or feet.   °· Tiredness (lethargy).   °· Nausea or vomiting.   °· Confusion.   °· Problems with urination, such as:   °¨ Painful or burning feeling during urination.   °¨ Decreased urine production.   °¨ Frequent accidents in children who are potty trained.   °¨ Bloody urine.   °· Muscle twitches and cramps.   °· Shortness of breath.   °· Seizures.   °· Chest  pain or pressure. °Sometimes, no symptoms are present.  °DIAGNOSIS °Acute kidney injury may be detected and diagnosed by tests, including blood, urine, imaging, or kidney biopsy tests.  °TREATMENT °Treatment of acute kidney injury varies depending on the cause and severity of the kidney damage. In mild cases, no treatment may be needed. The kidneys may heal on their own. If acute kidney injury is more severe, your health care provider will treat the cause of the kidney damage, help the kidneys heal, and prevent complications from occurring. Severe cases may require a procedure to remove toxic wastes from the body (dialysis) or surgery to repair kidney damage. Surgery may involve:  °· Repair of a torn kidney.   °· Removal of an obstruction. °HOME CARE INSTRUCTIONS °· Follow your prescribed diet. °· Take medicines only as directed by your health care provider.  °· Do not take any new medicines (prescription, over-the-counter, or nutritional supplements) unless approved by your health care provider. Many medicines can worsen your kidney damage or may need to have the dose adjusted.   °· Keep all follow-up visits as directed by your health care provider. This is important. °· Observe your condition to make sure you are healing as expected. °SEEK IMMEDIATE MEDICAL CARE IF: °· You are feeling ill or have severe pain in the back or side.   °· Your symptoms return or you have new symptoms. °· You have any symptoms of end-stage kidney disease. These include:   °¨ Persistent itchiness.   °¨   Loss of appetite.   °¨ Headaches.   °¨ Abnormally dark or light skin. °¨ Numbness in the hands or feet.   °¨ Easy bruising.   °¨ Frequent hiccups.   °¨ Menstruation stops.   °· You have a fever. °· You have increased urine production. °· You have pain or bleeding when urinating. °MAKE SURE YOU:  °· Understand these instructions. °· Will watch your condition. °· Will get help right away if you are not doing well or get worse. °  °This  information is not intended to replace advice given to you by your health care provider. Make sure you discuss any questions you have with your health care provider. °  °Document Released: 05/08/2011 Document Revised: 11/13/2014 Document Reviewed: 06/21/2012 °Elsevier Interactive Patient Education ©2016 [employer] °Cellulitis °Cellulitis is an infection of the skin and the tissue beneath it. The infected area is usually red and tender. Cellulitis occurs most often in the arms and lower legs.  °CAUSES  °Cellulitis is caused by bacteria that enter the skin through cracks or cuts in the skin. The most common types of bacteria that cause cellulitis are staphylococci and streptococci. °SIGNS AND SYMPTOMS  °· Redness and warmth. °· Swelling. °· Tenderness or pain. °· Fever. °DIAGNOSIS  °Your health care provider can usually determine what is wrong based on a physical exam. Blood tests may also be done. °TREATMENT  °Treatment usually involves taking an antibiotic medicine. °HOME CARE INSTRUCTIONS  °· Take your antibiotic medicine as directed by your health care provider. Finish the antibiotic even if you start to feel better. °· Keep the infected arm or leg elevated to reduce swelling. °· Apply a warm cloth to the affected area up to 4 times per day to relieve pain. °· Take medicines only as directed by your health care provider. °· Keep all follow-up visits as directed by your health care provider. °SEEK MEDICAL CARE IF:  °· You notice red streaks coming from the infected area. °· Your red area gets larger or turns dark in color. °· Your bone or joint underneath the infected area becomes painful after the skin has healed. °· Your infection returns in the same area or another area. °· You notice a swollen bump in the infected area. °· You develop new symptoms. °· You have a fever. °SEEK IMMEDIATE MEDICAL CARE IF:  °· You feel very sleepy. °· You develop vomiting or diarrhea. °· You have a general ill feeling (malaise)  with muscle aches and pains. °  °This information is not intended to replace advice given to you by your health care provider. Make sure you discuss any questions you have with your health care provider. °  °Document Released: 08/02/2005 Document Revised: 07/14/2015 Document Reviewed: 01/08/2012 °Elsevier Interactive Patient Education ©2016 [employer] ° °

## 2016-05-12 NOTE — Discharge Summary (Signed)
Physician Discharge Summary  Jacob Berger [id number] DOB: 05/17/1946 DOA: 05/10/2016  PCP: Dwan Bolt, MD  Admit date: 05/10/2016 Discharge date: 05/12/2016  Recommendations for Outpatient Follow-up:  1. Pt will need to follow up with PCP in 1 week post discharge 2. Please obtain BMP to evaluate electrolytes and kidney function 3. Continue doxy for 7 more days 4. Stop metformin until you are seen by PCP and to be determined if metformin should be restarted provided renal function is stable   Discharge Diagnoses:  Active Problems:   AKI (acute kidney injury) ([place])   Loss of balance   Type 2 diabetes mellitus ([place])   Peripheral edema  Discharge Condition: Stable  Diet recommendation: Heart healthy diet discussed in details   History of present illness:  70 y.o. male with known DM2, HTN, and RCC s/p left nephrectomy early June, presented to [facility] ED with main concern of sudden onset of confusion and left sided headache, unsteady gait. There was question about chest pain, pt explains he has intermittent and chronic chest pain but none since arrival to the ED.   Assessment & Plan:  Confusion, left sided headache, unsteady gait - unclear etiology, suspect component of dehydration with elevated Cr above pt's baseline and on medications lasix and metformin - CT head only with chronic small vessel changes - V/Q scan negative for PE - MRI brain with no evidence of acute stroke  - PT eval done, pt wants to go home  - no further recommendations per PT   Mildly elevated lactic acid, mild LLE erythema and edema, TTP  - suspect cellulitis - continue doxycycline day #3 and continue upon discharge to complete therapy   Dyspnea on exertion, acute on chronic diastolic CHF, NYHA class II - V/Q scan negative for PE  - ECHO with normal EF and grade I diastolic CHF - pt with some LE edema + 1 but no crackles on exam, weight is up from 242 --> 244 lbs  - resume home regimen with lasix    Left sided chest pain - Followed by Dr. Ellyn Hack  - Myoview 08/03/2015 at [facility]: [place]. EF 57%. 10.4 METS low risk scan no ischemia. No wall motion abnormality.  - No acute EKG findings  - troponins mildly elevated, flat, now WNL - no chest pain this AM  Hypokalemia - mild, supplemented   AKI - Baseline Cr 1.13. - AKI post left Nephrectomy early June (Cr 1.79), now with progressive AKI. Cr 2.6 - was given IVF for suspected dehydration - renal ultrasound with no acute abnormalities, ? Small lesions, needs outpatient follow up recommended - metformin has been stopped and pt made aware he should not take it until seen by PCP - Cr trended down   HTN, essential  - reasonable inpatient control   Ascending aortic aneurysm  - 4.5 cm on CTscan in March 2017.   DM2, uncontrolled. A1c early June 9.5 with complications of nephropathy  - resume home medical regimen   OSA - CPAP  CAD / PCI 2011 - continue Brillinta  Dysplipidemia.  - continue home tricor  Gout - continue zyloprim  Obesity - Body mass index is 32.2 kg/(m^2)   DVT prophylaxis: SCD  Code Status: Full Family Communication: Patient at bedside  Disposition Plan: Home  Consultants:   Neurology  Procedures:   None  Antimicrobials:   Doxycycline 7/5 -->  Discharge Exam: Filed Vitals:   05/12/16 0634 05/12/16 0948  BP: 123/69 112/57  Pulse: 60 66  Temp:  6 F (36.7 C) 97.8 F (36.6 C)  Resp: 17 18   Filed Vitals:   05/11/16 1640 05/11/16 2054 05/12/16 0634 05/12/16 0948  BP: 127/63 122/62 123/69 112/57  Pulse: 63 62 60 66  Temp: 98.2 F (36.8 C) 98.4 F (36.9 C) 98 F (36.7 C) 97.8 F (36.6 C)  TempSrc: Oral Oral Oral Oral  Resp: 17 16 17 18   Height:      Weight:   111.3 kg (245 lb 6 oz)   SpO2: 100% 96% 97% 98%    General: Pt is alert, follows commands appropriately, not in acute distress Cardiovascular: Regular rate and rhythm, S1/S2 +, no murmurs, no rubs, no  gallops Respiratory: Clear to auscultation bilaterally, no wheezing, no crackles, no rhonchi Abdominal: Soft, non tender, non distended, bowel sounds +, no guarding Extremities: LLE edema above the ankle area with mild TTP and warmth to touch, no cyanosis, pulses palpable bilaterally DP and PT Neuro: Grossly nonfocal  Discharge Instructions  Discharge Instructions    Diet - low sodium heart healthy    Complete by:  As directed      Increase activity slowly    Complete by:  As directed             Medication List    STOP taking these medications        metFORMIN 500 MG tablet  Commonly known as:  GLUCOPHAGE      TAKE these medications        allopurinol 300 MG tablet  Commonly known as:  ZYLOPRIM  Take 300 mg by mouth 2 (two) times daily.     AZOR 10-40 MG tablet  Generic drug:  amLODipine-olmesartan  TAKE 1 TABLET BY MOUTH DAILY     B-D ULTRAFINE III SHORT PEN 31G X 8 MM Misc  Generic drug:  Insulin Pen Needle     BRILINTA 60 MG Tabs tablet  Generic drug:  ticagrelor  Take 60 mg by mouth 2 (two) times daily.     CITRUCEL PO  Take 1 tablet by mouth daily.     diphenhydrAMINE 25 mg capsule  Commonly known as:  BENADRYL  Take 25 mg by mouth every 6 (six) hours as needed for itching.     dorzolamide-timolol 22.3-6.8 MG/ML ophthalmic solution  Commonly known as:  COSOPT  Place 1 drop into both eyes 2 (two) times daily.     doxycycline 100 MG tablet  Commonly known as:  VIBRA-TABS  Take 1 tablet (100 mg total) by mouth every 12 (twelve) hours.     fenofibrate 145 MG tablet  Commonly known as:  TRICOR  Take 145 mg by mouth daily.     furosemide 40 MG tablet  Commonly known as:  LASIX  Take 20-40 mg by mouth daily as needed for fluid or edema.     HYDROcodone-acetaminophen 5-325 MG tablet  Commonly known as:  NORCO  Take 1-2 tablets by mouth every 6 (six) hours as needed for moderate pain.     LEVEMIR FLEXTOUCH 100 UNIT/ML Pen  Generic drug:  Insulin  Detemir  Inject 40 Units into the skin at bedtime.     metoprolol succinate 100 MG 24 hr tablet  Commonly known as:  TOPROL-XL  Take 1 tablet (100 mg total) by mouth daily.     TANZEUM 50 MG Pen  Generic drug:  Albiglutide  Inject 50 mg into the skin every Thursday.     travoprost (benzalkonium) 0.004 % ophthalmic solution  Commonly  known as:  TRAVATAN  Place 1 drop into both eyes at bedtime.     VYTORIN 10-40 MG tablet  Generic drug:  ezetimibe-simvastatin  Take 1 tablet by mouth daily at 6 PM.           Follow-up Information    Follow up with Dwan Bolt, MD.   Specialty:  Endocrinology   Contact information:   [address] [place] [address] [phone]       Call Faye Ramsay, MD.   Specialty:  Internal Medicine   Why:  As needed call my cell phone [phone]   Contact information:   [address] [place] [address] [phone]        The results of significant diagnostics from this hospitalization (including imaging, microbiology, ancillary and laboratory) are listed below for reference.     Microbiology: No results found for this or any previous visit (from the past 240 hour(s)).   Labs: Basic Metabolic Panel:  Recent Labs Lab 05/10/16 1208 05/10/16 1255 05/11/16 0315 05/12/16 0556  NA 136 139 138 140  K 3.6 3.7 3.4* 3.9  CL 104 104 108 110  CO2 20*  --  22 24  GLUCOSE 235* 232* 170* 166*  BUN 40* 41* 38* 29*  CREATININE 2.48* 2.60* 2.26* 1.95*  CALCIUM 10.2  --  9.7 9.5   Liver Function Tests:  Recent Labs Lab 05/10/16 1208  AST 26  ALT 20  ALKPHOS 75  BILITOT 0.5  PROT 8.5*  ALBUMIN 4.1    Recent Labs Lab 05/10/16 1208  LIPASE 65*   CBC:  Recent Labs Lab 05/10/16 1208 05/10/16 1255 05/11/16 0315 05/12/16 0556  WBC 7.2  --  5.8 6.0  NEUTROABS 4.5  --   --   --   HGB 12.3* 13.3 11.0* 10.9*  HCT 36.7* 39.0 33.3* 32.9*  MCV 84.8  --  84.9 85.0  PLT 214  --   182 177   Cardiac Enzymes:  Recent Labs Lab 05/10/16 1551 05/10/16 2218 05/11/16 0315  TROPONINI 0.03* 0.05* <0.03   BNP: BNP (last 3 results)  Recent Labs  05/10/16 1208  BNP 18.5   CBG:  Recent Labs Lab 05/11/16 0824 05/11/16 1127 05/11/16 1639 05/11/16 2148 05/12/16 0745  GLUCAP 181* 250* 153* 162* 147*   SIGNED: Time coordinating discharge: 30 minutes  MAGICK-Jams Trickett, MD  [facility] 05/12/2016, 11:15 AM Pager [phone]  If 7PM-7AM, please contact night-coverage [email or web address] Password [email or web address]

## 2016-05-24 DIAGNOSIS — E118 Type 2 diabetes mellitus with unspecified complications: Secondary | ICD-10-CM | POA: Diagnosis not present

## 2016-05-24 DIAGNOSIS — N289 Disorder of kidney and ureter, unspecified: Secondary | ICD-10-CM | POA: Diagnosis not present

## 2016-05-24 DIAGNOSIS — L03019 Cellulitis of unspecified finger: Secondary | ICD-10-CM | POA: Diagnosis not present

## 2016-05-31 DIAGNOSIS — H401132 Primary open-angle glaucoma, bilateral, moderate stage: Secondary | ICD-10-CM | POA: Diagnosis not present

## 2016-06-07 DIAGNOSIS — L0390 Cellulitis, unspecified: Secondary | ICD-10-CM | POA: Diagnosis not present

## 2016-06-07 DIAGNOSIS — I10 Essential (primary) hypertension: Secondary | ICD-10-CM | POA: Diagnosis not present

## 2016-06-07 DIAGNOSIS — E118 Type 2 diabetes mellitus with unspecified complications: Secondary | ICD-10-CM | POA: Diagnosis not present

## 2016-06-20 DIAGNOSIS — H401123 Primary open-angle glaucoma, left eye, severe stage: Secondary | ICD-10-CM | POA: Diagnosis not present

## 2016-07-06 DIAGNOSIS — I10 Essential (primary) hypertension: Secondary | ICD-10-CM | POA: Diagnosis not present

## 2016-07-06 DIAGNOSIS — E118 Type 2 diabetes mellitus with unspecified complications: Secondary | ICD-10-CM | POA: Diagnosis not present

## 2016-07-13 DIAGNOSIS — E118 Type 2 diabetes mellitus with unspecified complications: Secondary | ICD-10-CM | POA: Diagnosis not present

## 2016-07-13 DIAGNOSIS — G4733 Obstructive sleep apnea (adult) (pediatric): Secondary | ICD-10-CM | POA: Diagnosis not present

## 2016-07-13 DIAGNOSIS — I10 Essential (primary) hypertension: Secondary | ICD-10-CM | POA: Diagnosis not present

## 2016-08-10 ENCOUNTER — Telehealth: Payer: Self-pay | Admitting: Cardiology

## 2016-08-10 ENCOUNTER — Other Ambulatory Visit: Payer: Self-pay | Admitting: Cardiology

## 2016-08-10 MED ORDER — BRILINTA 60 MG PO TABS: 60.0000 mg | ORAL_TABLET | Freq: Two times a day (BID) | ORAL | 2 refills | Status: DC

## 2016-08-10 NOTE — Telephone Encounter (Signed)
Med refilled at patient request. There were no indications to discontinue this med at last hospitalization. Pt aware he is due for f/u visit and will call when he wishes to make appt.

## 2016-08-10 NOTE — Telephone Encounter (Signed)
New Message  Pt c/o medication issue:  1. Name of Medication: Brilinta  2. How are you currently taking this medication (dosage and times per day)?  60 mg tablets and not listed  3. Are you having a reaction (difficulty breathing--STAT)? No  4. What is your medication issue? Pt voiced this medication was taken away and was not replaced with anything else.  Pt wants someone to contact pharmacy about this because pt voiced he only has one pill left.  Please f/u with pt

## 2016-09-08 DIAGNOSIS — I10 Essential (primary) hypertension: Secondary | ICD-10-CM | POA: Diagnosis not present

## 2016-09-08 DIAGNOSIS — E118 Type 2 diabetes mellitus with unspecified complications: Secondary | ICD-10-CM | POA: Diagnosis not present

## 2016-09-08 DIAGNOSIS — E789 Disorder of lipoprotein metabolism, unspecified: Secondary | ICD-10-CM | POA: Diagnosis not present

## 2016-09-12 ENCOUNTER — Encounter: Payer: Self-pay | Admitting: Cardiology

## 2016-09-15 ENCOUNTER — Ambulatory Visit (INDEPENDENT_AMBULATORY_CARE_PROVIDER_SITE_OTHER): Payer: Medicare Other | Admitting: Cardiology

## 2016-09-15 ENCOUNTER — Encounter: Payer: Self-pay | Admitting: Cardiology

## 2016-09-15 VITALS — BP 120/72 | HR 70 | Ht 72.5 in | Wt 266.6 lb

## 2016-09-15 DIAGNOSIS — I2510 Atherosclerotic heart disease of native coronary artery without angina pectoris: Secondary | ICD-10-CM | POA: Diagnosis not present

## 2016-09-15 DIAGNOSIS — E669 Obesity, unspecified: Secondary | ICD-10-CM

## 2016-09-15 DIAGNOSIS — E785 Hyperlipidemia, unspecified: Secondary | ICD-10-CM | POA: Diagnosis not present

## 2016-09-15 DIAGNOSIS — Z9989 Dependence on other enabling machines and devices: Secondary | ICD-10-CM

## 2016-09-15 DIAGNOSIS — Z9861 Coronary angioplasty status: Secondary | ICD-10-CM

## 2016-09-15 DIAGNOSIS — I10 Essential (primary) hypertension: Secondary | ICD-10-CM | POA: Diagnosis not present

## 2016-09-15 DIAGNOSIS — G4733 Obstructive sleep apnea (adult) (pediatric): Secondary | ICD-10-CM

## 2016-09-15 DIAGNOSIS — I712 Thoracic aortic aneurysm, without rupture, unspecified: Secondary | ICD-10-CM

## 2016-09-15 DIAGNOSIS — Z23 Encounter for immunization: Secondary | ICD-10-CM | POA: Diagnosis not present

## 2016-09-15 DIAGNOSIS — Z794 Long term (current) use of insulin: Secondary | ICD-10-CM

## 2016-09-15 DIAGNOSIS — R609 Edema, unspecified: Secondary | ICD-10-CM

## 2016-09-15 DIAGNOSIS — E119 Type 2 diabetes mellitus without complications: Secondary | ICD-10-CM

## 2016-09-15 NOTE — Patient Instructions (Signed)
NO CHANGES WITH CURRENT MEDICATIONS   Your physician wants you to follow-up in: 12 MONTH WITH DR HARDING. You will receive a reminder letter in the mail two months in advance. If you don't receive a letter, please call our office to schedule the follow-up appointment.   If you need a refill on your cardiac medications before your next appointment, please call your pharmacy.  

## 2016-09-15 NOTE — Progress Notes (Signed)
PCP: Dwan Bolt, MD  Clinic Note: Chief Complaint  Patient presents with  . Coronary Artery Disease    s/p PCI  . Shortness of Breath    with exertion    HPI: Jacob Berger is a 70 y.o. male with a PMH below who presents today for 6-8 month follow-up for his CAD and PCI (status post RCA PCI in 2011 performed in [place] with Endeavor DES stent).  Jacob Berger was last seen on February 2017 -doing fairly well. Mild puffy swelling but no anginal symptoms. He was going to the gym to start every day. Had a little more exertional fatigue and usual, but was doing fine with no angina.  Saw Dr. Cyndia Bent for Thoracic Aortic Aneurysm in May 2017.  Recent Hospitalizations:   Renal Cell CA - s/p L Nephrectomy (in June)  ? 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  He is starting to get back into the swing of things now, and is able to do 3-4 d/week 45 -60 min exercise - TM & situps / machines & arm curls (this can lead to pain) --> no routine CP.  Does still have intermittent CP spells, but less frequent than before (he noted that he used to have them much more frequently) & not routinely associated with exertion.  He does get occasional DOE, but also notes that his K knee pain has been limiting his exercise.  Occasional orthopnea or PND - if not on CAP. Mild L>R ankle swelling - PCP gave Rx for Lasix. Rare palpitations, but No lightheadedness, dizziness, weakness or syncope/near syncope. No TIA/amaurosis fugax symptoms. No melena, hematochezia, hematuria, or epstaxis. No claudication.  ROS: A comprehensive was performed. Review of Systems  Constitutional: Negative for malaise/fatigue (getting better as he recovers from Sgx).  HENT: Negative for congestion and  nosebleeds.   Respiratory: Negative for cough, shortness of breath and wheezing.   Cardiovascular: Negative for claudication and leg swelling (Knee). Chest pain: as per HPI.       Per HPI  Gastrointestinal: Positive for abdominal pain (with consitpation) and constipation. Negative for blood in stool, heartburn and melena.  Genitourinary: Negative for hematuria (not since Nehprectomy).  Musculoskeletal: Positive for joint pain (L knee OA - swells & painful) and myalgias (muscle pain associated with Knee pain).  Neurological: Negative for dizziness and loss of consciousness.  Endo/Heme/Allergies: Negative for environmental allergies.  Psychiatric/Behavioral: Negative for memory loss. The patient is not nervous/anxious and does not have insomnia.   All other systems reviewed and are negative.   Past Medical History:  Diagnosis Date  . Arthritis    "knees" (05/10/2016)  . CAD S/P percutaneous coronary angioplasty 2011   PCI of PDA Endeavor 2.5 mm x 12 mm DES - [place] [facility]  . Cancer of kidney ([place])   . Complication of anesthesia   . Daily headache    "recently" (05/10/2016)  . Dyslipidemia, goal LDL below 70   . Glaucoma   . Gout   . Hypertension, essential   . Obesity, Class II, BMI 35-39.9, with comorbidity    BMI 37  . OSA on CPAP   . PONV (postoperative nausea and vomiting)    ponv after colonscopy  . Prostate cancer ([place]) 2007  . Rash since  04-08-16   on neck healing, right arm improving  . Thoracic aortic aneurysm without rupture ([place]) - Noted on chest CT. 4.5 cm 02/15/2016   4.5 cm per dr Cyndia Bent note and [facility] ct 01-18-16  . Type II diabetes mellitus ([place])     Past Surgical History:  Procedure Laterality Date  . COLONOSCOPY W/ BIOPSIES AND POLYPECTOMY  "several"  . CORONARY ANGIOPLASTY WITH STENT PLACEMENT  2001   [place]: Endeavor 2.5 mm x 12 mm DES - distal PDA ([place])  . LAPAROSCOPIC NEPHRECTOMY Left 04/13/2016   Procedure:  LAPAROSCOPIC RADICAL LEFT NEPHRECTOMY;  Surgeon: Raynelle Bring, MD;  Location: [facility];  Service: Urology;  Laterality: Left;  . NM MYOVIEW LTD  June 2013; Sep 2016   a. Treadmill Myoview: 10 minutes, 12 METS; no ischemia infarction, EF 65%; b. [facility]: LOW RISK. EF 57%. 10.4 METS low risk scan no ischemia. No wall motion abnormality.   Marland Kitchen PROSTATECTOMY    . REFRACTIVE SURGERY Left     Prior to Admission medications   Medication Sig Start Date Taking? Authorizing Provider  Albiglutide (TANZEUM) 50 MG PEN Inject 50 mg into the skin every Thursday.   Yes Historical Provider, MD  allopurinol (ZYLOPRIM) 300 MG tablet Take 300 mg by mouth 2 (two) times daily.   Yes Historical Provider, MD  AZOR 10-40 MG per tablet TAKE 1 TABLET BY MOUTH DAILY 12/18/13 Yes Leonie Man, MD  B-D ULTRAFINE III SHORT PEN 31G X 8 MM MISC  02/23/15 Yes Historical Provider, MD  BRILINTA 60 MG TABS tablet Take 1 tablet (60 mg total) by mouth 2 (two) times daily. 08/10/16 Yes Leonie Man, MD  diphenhydrAMINE (BENADRYL) 25 mg capsule Take 25 mg by mouth every 6 (six) hours as needed for itching.  Yes Historical Provider, MD  dorzolamide-timolol (COSOPT) 22.3-6.8 MG/ML ophthalmic solution Place 1 drop into both eyes 2 (two) times daily.  Yes Historical Provider, MD  doxycycline (VIBRA-TABS) 100 MG tablet Take 1 tablet (100 mg total) by mouth every 12 (twelve) hours. 05/12/16 Yes Theodis Blaze, MD  fenofibrate (TRICOR) 145 MG tablet Take 145 mg by mouth daily.  Yes Historical Provider, MD  furosemide (LASIX) 40 MG tablet Take 20-40 mg by mouth daily as needed for fluid or edema.   Yes Historical Provider, MD  HYDROcodone-acetaminophen (NORCO) 5-325 MG tablet Take 1-2 tablets by mouth every 6 (six) hours as needed for moderate pain. 04/13/16 Yes Amanda Dancy, PA-C  LEVEMIR FLEXTOUCH 100 UNIT/ML Pen Inject 40 Units into the skin at bedtime.  01/04/16 Yes Historical Provider, MD  Methylcellulose, Laxative, (CITRUCEL PO) Take 1 tablet by  mouth daily.  Yes Historical Provider, MD  metoprolol succinate (TOPROL-XL) 100 MG 24 hr tablet Take 1 tablet (100 mg total) by mouth daily. 12/31/15 Yes Leonie Man, MD  NOVOLOG FLEXPEN 100 UNIT/ML FlexPen Inject 14 Units into the skin 2 (two) times daily after a meal. 08/10/16 Yes Historical Provider, MD  travoprost, benzalkonium, (TRAVATAN) 0.004 % ophthalmic solution Place 1 drop into both eyes at bedtime.  Yes Historical Provider, MD  VYTORIN 10-40 MG per tablet Take 1 tablet by mouth daily at 6 PM.  04/27/15 Yes Historical Provider, MD    Allergies  Allergen Reactions  . Accupril [Quinapril Hcl] Swelling and Other (See Comments)    Mouth swelling    Social History   Social History  . Marital status: Married    Spouse name: N/A  . Number of children:  N/A  . Years of education: N/A   Social History Main Topics  . Smoking status: Former Smoker    Types: Cigarettes    Quit date: 06/04/1993  . Smokeless tobacco: Never Used     Comment: A PK WOULD LAST A WEEK  . Alcohol use 3.6 oz/week    2 Glasses of wine, 4 Cans of beer per week  . Drug use: No  . Sexual activity: Not Currently   Other Topics Concern  . None   Social History Narrative   Married father of 3. Had been walking up to 2 miles every other day appetite is good his weight for about half an hour time period, but limited now due to back pain. Occasional alcohol. No tobacco products -- he quit about 20-30 years ago.   Family History  Problem Relation Age of Onset  . Family history unknown: Yes    Wt Readings from Last 3 Encounters:  09/15/16 120.9 kg (266 lb 9.6 oz)  05/12/16 111.3 kg (245 lb 6 oz)  04/13/16 117.9 kg (260 lb)    PHYSICAL EXAM BP 120/72   Pulse 70   Ht 6' 0.5" (1.842 m)   Wt 120.9 kg (266 lb 9.6 oz)   BMI 35.66 kg/m  General appearance: alert and oriented x3, cooperative, appears stated age, no distress, moderately obese and well-groomed, well-nourished. Pleasant mood and affect. Answers  questions properly.  HEENT: [place]/AT, EOMI, MMM, anicteric sclera  Neck: no adenopathy, no JVD, supple, symmetrical, trachea midline and he may have a soft right carotid bruit versus just radiation of his systolic murmur.  Lungs: clear to auscultation bilaterally, normal percussion bilaterally and nonlabored with good air movement.  Heart: regular rate and rhythm, S1, S2 normal, no S3 or S4; 1/6 c-d SEM @ RUSB --> carotids. No click and no rub  Abdomen: soft, non-tender; bowel sounds normal; no masses, no organomegaly and moderate obesity  Extremities: no C/C/E; Pulses: 2+ and symmetric  Neurologic: Grossly normal Skin: No significant rashes or lesions.   Adult ECG Report Not done  Other studies Reviewed: Additional studies/ records that were reviewed today include:  Recent Labs:  Due to see Dr. Wilson Singer today. (had labs last week) Lab Results  Component Value Date   CHOL 109 08/06/2014   HDL 32 (L) 08/06/2014   LDLCALC 56 08/06/2014   TRIG 106 08/06/2014   CHOLHDL 3.4 08/06/2014    ASSESSMENT / PLAN: Problem List Items Addressed This Visit    CAD S/P percutaneous coronary angioplasty - Primary (Chronic)    Doing relatively well now with no typical anginal pain.  CP & DOE has improved. On Brilinta, ARB/CCB & BB along with Vytorin.      Hypertension, essential (Chronic)    Well controlled on Combo ARB/CCB  + Toprol.       Obesity (BMI 30-39.9) (Chronic)    Gained back all of the post-op wgt loss.  Needs to continue to adjust diet & continue exercise.      Relevant Medications   NOVOLOG FLEXPEN 100 UNIT/ML FlexPen   OSA on CPAP (Chronic)    Changed to Nasal Pillows - tolerating much better. I suspect that this may be part of the reason that his DOE & intermittent CP has improved.      Diabetes mellitus, type II, insulin dependent (HCC) (Chronic)   Relevant Medications   NOVOLOG FLEXPEN 100 UNIT/ML FlexPen   Dyslipidemia, goal LDL below 70 (Chronic)    Labs from PCP  dated  12/22/2015 TC 105, TG 160, HDL 26, LDL*47 -- but from NMR # (Goal):  TG 169 (<149), TC 97 (100-199) -->HDL-C 24 (>39*), HDL-P 24.6 (goal >30.5), LDL-P 894 (<1000), Sm LDL-P 695 (goal <527). DLD size 19.8 (goal >20.5),  Not quite at goal, but close with Vytorin & Fenofibrate.      Thoracic aortic aneurysm without rupture ([place]) - Noted on chest CT. 4.5 cm (Chronic)    Noted on Chest CT - has had consult visit with Dr. Cyndia Bent. Plan is annual f/u CT - Med RX with statin & BP control with monitoring until > 5.5 cm.      Peripheral edema    Pretty stable - has PRN PO Lasix ordered - not taking daily.         Current medicines are reviewed at length with the patient today. (+/- concerns) n/a The following changes have been made:   Patient Instructions  NO CHANGES WITH CURRENT MEDICATIONS   Your physician wants you to follow-up in: [place]. You will receive a reminder letter in the mail two months in advance. If you don't receive a letter, please call our office to schedule the follow-up appointment.   If you need a refill on your cardiac medications before your next appointment, please call your pharmacy.    Studies Ordered:   No orders of the defined types were placed in this encounter.     Glenetta Hew, M.D., M.S. Interventional Cardiologist   Pager # [phone] Phone # [phone] [address] [place], [place] [address]

## 2016-09-16 ENCOUNTER — Encounter: Payer: Self-pay | Admitting: Cardiology

## 2016-09-16 NOTE — Assessment & Plan Note (Signed)
Pretty stable - has PRN PO Lasix ordered - not taking daily.

## 2016-09-16 NOTE — Assessment & Plan Note (Signed)
Changed to Nasal Pillows - tolerating much better. I suspect that this may be part of the reason that his DOE & intermittent CP has improved.

## 2016-09-16 NOTE — Assessment & Plan Note (Signed)
Noted on Chest CT - has had consult visit with Dr. Cyndia Bent. Plan is annual f/u CT - Med RX with statin & BP control with monitoring until > 5.5 cm.

## 2016-09-16 NOTE — Assessment & Plan Note (Signed)
Well controlled on Combo ARB/CCB  + Toprol.

## 2016-09-16 NOTE — Assessment & Plan Note (Signed)
Labs from PCP dated 12/22/2015 TC 105, TG 160, HDL 26, LDL*47 -- but from NMR # (Goal):  TG 169 (<149), TC 97 (100-199) -->HDL-C 24 (>39*), HDL-P 24.6 (goal >30.5), LDL-P 894 (<1000), Sm LDL-P 695 (goal <527). DLD size 19.8 (goal >20.5),  Not quite at goal, but close with Vytorin & Fenofibrate.

## 2016-09-16 NOTE — Assessment & Plan Note (Signed)
Doing relatively well now with no typical anginal pain.  CP & DOE has improved. On Brilinta, ARB/CCB & BB along with Vytorin.

## 2016-09-16 NOTE — Assessment & Plan Note (Signed)
Gained back all of the post-op wgt loss.  Needs to continue to adjust diet & continue exercise.

## 2016-09-19 DIAGNOSIS — H401123 Primary open-angle glaucoma, left eye, severe stage: Secondary | ICD-10-CM | POA: Diagnosis not present

## 2016-09-20 DIAGNOSIS — E118 Type 2 diabetes mellitus with unspecified complications: Secondary | ICD-10-CM | POA: Diagnosis not present

## 2016-09-20 DIAGNOSIS — E789 Disorder of lipoprotein metabolism, unspecified: Secondary | ICD-10-CM | POA: Diagnosis not present

## 2016-10-16 ENCOUNTER — Other Ambulatory Visit: Payer: Self-pay | Admitting: Cardiology

## 2016-11-01 DIAGNOSIS — C642 Malignant neoplasm of left kidney, except renal pelvis: Secondary | ICD-10-CM | POA: Diagnosis not present

## 2016-11-08 DIAGNOSIS — Z8546 Personal history of malignant neoplasm of prostate: Secondary | ICD-10-CM | POA: Diagnosis not present

## 2016-11-08 DIAGNOSIS — C642 Malignant neoplasm of left kidney, except renal pelvis: Secondary | ICD-10-CM | POA: Diagnosis not present

## 2016-11-08 DIAGNOSIS — Z85528 Personal history of other malignant neoplasm of kidney: Secondary | ICD-10-CM | POA: Diagnosis not present

## 2016-11-08 DIAGNOSIS — D4101 Neoplasm of uncertain behavior of right kidney: Secondary | ICD-10-CM | POA: Diagnosis not present

## 2016-11-13 ENCOUNTER — Other Ambulatory Visit: Payer: Self-pay | Admitting: Urology

## 2016-11-13 DIAGNOSIS — C649 Malignant neoplasm of unspecified kidney, except renal pelvis: Secondary | ICD-10-CM

## 2016-11-14 ENCOUNTER — Other Ambulatory Visit (HOSPITAL_COMMUNITY): Payer: Self-pay | Admitting: Urology

## 2016-11-14 ENCOUNTER — Ambulatory Visit (HOSPITAL_COMMUNITY)
Admission: RE | Admit: 2016-11-14 | Discharge: 2016-11-14 | Disposition: A | Discharge status: Home / Self Care | Payer: Medicare Other | Source: Ambulatory Visit | Attending: Urology | Admitting: Urology

## 2016-11-14 DIAGNOSIS — Z905 Acquired absence of kidney: Secondary | ICD-10-CM | POA: Insufficient documentation

## 2016-11-14 DIAGNOSIS — I77810 Thoracic aortic ectasia: Secondary | ICD-10-CM | POA: Diagnosis not present

## 2016-11-14 DIAGNOSIS — R05 Cough: Secondary | ICD-10-CM | POA: Diagnosis not present

## 2016-11-14 DIAGNOSIS — C642 Malignant neoplasm of left kidney, except renal pelvis: Secondary | ICD-10-CM | POA: Insufficient documentation

## 2016-11-14 DIAGNOSIS — I517 Cardiomegaly: Secondary | ICD-10-CM | POA: Diagnosis not present

## 2016-11-23 ENCOUNTER — Inpatient Hospital Stay: Admission: RE | Admit: 2016-11-23 | Payer: Medicare Other | Source: Ambulatory Visit

## 2016-11-24 ENCOUNTER — Ambulatory Visit
Admission: RE | Admit: 2016-11-24 | Discharge: 2016-11-24 | Disposition: A | Discharge status: Home / Self Care | Payer: Medicare Other | Source: Ambulatory Visit | Attending: Urology | Admitting: Urology

## 2016-11-24 DIAGNOSIS — C649 Malignant neoplasm of unspecified kidney, except renal pelvis: Secondary | ICD-10-CM

## 2016-11-24 DIAGNOSIS — R109 Unspecified abdominal pain: Secondary | ICD-10-CM | POA: Diagnosis not present

## 2016-11-24 MED ORDER — GADOBENATE DIMEGLUMINE 529 MG/ML IV SOLN
10.0000 mL | Freq: Once | INTRAVENOUS | Status: AC | PRN
  Administered 2016-11-24: 10 mL via INTRAVENOUS

## 2016-12-08 ENCOUNTER — Other Ambulatory Visit: Payer: Self-pay

## 2016-12-12 ENCOUNTER — Other Ambulatory Visit: Payer: Self-pay

## 2016-12-12 MED ORDER — BRILINTA 60 MG PO TABS: 60.0000 mg | ORAL_TABLET | Freq: Two times a day (BID) | ORAL | 3 refills | Status: DC

## 2016-12-19 DIAGNOSIS — N289 Disorder of kidney and ureter, unspecified: Secondary | ICD-10-CM | POA: Diagnosis not present

## 2016-12-19 DIAGNOSIS — E789 Disorder of lipoprotein metabolism, unspecified: Secondary | ICD-10-CM | POA: Diagnosis not present

## 2016-12-19 DIAGNOSIS — E118 Type 2 diabetes mellitus with unspecified complications: Secondary | ICD-10-CM | POA: Diagnosis not present

## 2016-12-19 DIAGNOSIS — E139 Other specified diabetes mellitus without complications: Secondary | ICD-10-CM | POA: Diagnosis not present

## 2016-12-27 ENCOUNTER — Ambulatory Visit (HOSPITAL_COMMUNITY)
Admission: RE | Admit: 2016-12-27 | Discharge: 2016-12-27 | Disposition: A | Discharge status: Home / Self Care | Payer: Medicare Other | Source: Ambulatory Visit | Attending: Cardiology | Admitting: Cardiology

## 2016-12-27 ENCOUNTER — Other Ambulatory Visit (HOSPITAL_COMMUNITY): Payer: Self-pay | Admitting: Endocrinology

## 2016-12-27 DIAGNOSIS — R200 Anesthesia of skin: Secondary | ICD-10-CM | POA: Diagnosis not present

## 2016-12-27 DIAGNOSIS — E118 Type 2 diabetes mellitus with unspecified complications: Secondary | ICD-10-CM | POA: Diagnosis not present

## 2016-12-27 DIAGNOSIS — M109 Gout, unspecified: Secondary | ICD-10-CM | POA: Diagnosis not present

## 2016-12-27 DIAGNOSIS — I10 Essential (primary) hypertension: Secondary | ICD-10-CM | POA: Diagnosis not present

## 2016-12-27 DIAGNOSIS — E789 Disorder of lipoprotein metabolism, unspecified: Secondary | ICD-10-CM | POA: Diagnosis not present

## 2017-01-03 NOTE — Progress Notes (Signed)
Recent Labs: 12/19/2016 Na+ 138, K+ 4.2, Cl- 102, HCO3- 22 , BUN 36, Cr 2.2, Glu 196 (high), Ca2+ 9.2; AST 25, ALT 28, AlkP 78, Alb 4.1, TP 7.7, T Bili 0.4 CBC: W 7.3, H/H 11.6/35.7, Plt 231;HgbA1c: 8.8 (improved from 9.2) Lipids not checked since February 2017 -NMR: total cholesterol is 97, triglycerides 169, HDL 26, LDL 47; HDL-P 24.6 (-low), HDL-C 24 (goal is> 39); LDL-P8 194 (goal) ( small LDL-P6 197 (high), LDL size 19.8 (borderline), LPL-IR 62 (high)  Glenetta Hew, MD

## 2017-01-04 DIAGNOSIS — H01003 Unspecified blepharitis right eye, unspecified eyelid: Secondary | ICD-10-CM | POA: Diagnosis not present

## 2017-01-08 ENCOUNTER — Telehealth: Payer: Self-pay | Admitting: Cardiology

## 2017-01-08 MED ORDER — BRILINTA 60 MG PO TABS: 60.0000 mg | ORAL_TABLET | Freq: Two times a day (BID) | ORAL | 2 refills | Status: DC

## 2017-01-08 NOTE — Telephone Encounter (Signed)
Rx(s) sent to pharmacy electronically.  

## 2017-01-08 NOTE — Telephone Encounter (Signed)
°*  STAT* If patient is at the pharmacy, call can be transferred to refill team.   1. Which medications need to be refilled? (please list name of each medication and dose if known) Brilinta-pt is changing pharmacy  2. Which pharmacy/location (including street and city if local pharmacy) is medication to be sent to [facility]  3. Do they need a 30 day or 90 day supply? 180 and refills

## 2017-01-09 NOTE — Progress Notes (Signed)
Cardiology Office Note    Date:  01/11/2017   ID:  Jacob Berger, DOB [birth year], MRN [id number]  PCP:  Dwan Bolt, MD  Cardiologist:  Dr. Ellyn Hack   Chief Complaint  Patient presents with  . Follow-up    Numbness on left side off and on.    History of Present Illness:    Jacob Berger is a 71 y.o. male with past medical history of CAD (s/p DES to PDA in 2011, low-risk NST in 07/2015), HTN, HLD, OSA (on CPAP), renal cell cancer (s/p nephrectomy in 04/2016) and thoracic aortic aneurysm (followed by CT Surgery, at 4.5cm in 02/2016) who presents to the office today for evaluation of left arm numbness.   Last seen by Dr. Ellyn Hack in 09/2016 and reported returning to exercising following his recent surgery, able to do 45-60 minutes of exercise without recurrent angina. Did report occasional episodes of chest discomfort which would occur at rest and no associated with exertion. Was continued on Brilinta along with BB, Azor, and statin therapy.   In talking with the patient today, he reports having 2 episodes of left hand and arm numbness which has occurred over the past month. The first episode occurred while he was the passenger in a vehicle and started in his hand and radiated up to his shoulder and neck. The episode lasted approximately 45 minutes and spontaneously resolved. He had a repeat episode approximately 2 days ago which was similar and spontaneously resolved. Not made worse or allieved with different motions. He denies any associated chest discomfort, palpitations, dyspnea with exertion, nausea, vomiting, or diaphoresis. No orthopnea, PND, or lower extremity edema. He nor his family members noticed any slurred speech, facial droop, or altered mental status with these episodes.  He goes to the gym several days per week and performs aerobic activities and weight lifting. He denies any associated chest discomfort, dyspnea, or left arm numbness with these activities.  Reports good  compliance with his medication regimen including Brilinta. Denies any evidence of active bleeding.   Past Medical History:  Diagnosis Date  . Arthritis    "knees" (05/10/2016)  . CAD S/P percutaneous coronary angioplasty 2011   a. 2011: s/p PCI to PDA with Endeavor 2.5 mm x 12 mm DES - [place] b. low-risk NST in 07/2015  . Cancer of kidney ([place])   . Complication of anesthesia   . Daily headache    "recently" (05/10/2016)  . Dyslipidemia, goal LDL below 70   . Glaucoma   . Gout   . Hypertension, essential   . Obesity, Class II, BMI 35-39.9, with comorbidity    BMI 37  . OSA on CPAP   . PONV (postoperative nausea and vomiting)    ponv after colonscopy  . Prostate cancer ([place]) 2007  . Rash since 04-08-16   on neck healing, right arm improving  . Thoracic aortic aneurysm without rupture ([place]) - Noted on chest CT. 4.5 cm 02/15/2016   4.5 cm per dr Cyndia Bent note and [facility] ct 01-18-16  . Type II diabetes mellitus ([place])     Past Surgical History:  Procedure Laterality Date  . COLONOSCOPY W/ BIOPSIES AND POLYPECTOMY  "several"  . CORONARY ANGIOPLASTY WITH STENT PLACEMENT  2001   [place]: Endeavor 2.5 mm x 12 mm DES - distal PDA ([facility])  . LAPAROSCOPIC NEPHRECTOMY Left 04/13/2016   Procedure: LAPAROSCOPIC RADICAL LEFT NEPHRECTOMY;  Surgeon: Raynelle Bring, MD;  Location: [facility];  Service: Urology;  Laterality: Left;  . NM MYOVIEW LTD  June 2013; Sep 2016   a. Treadmill Myoview: 10 minutes, 12 METS; no ischemia infarction, EF 65%; b. [facility]: LOW RISK. EF 57%. 10.4 METS low risk scan no ischemia. No wall motion abnormality.   Marland Kitchen PROSTATECTOMY    . REFRACTIVE SURGERY Left     Current Medications: Outpatient Medications Prior to Visit  Medication Sig Dispense Refill  . Albiglutide (TANZEUM) 50 MG PEN Inject 50 mg into the skin every Thursday.     Marland Kitchen allopurinol (ZYLOPRIM) 300 MG tablet Take 300 mg by mouth daily.     . AZOR 10-40 MG per tablet  TAKE 1 TABLET BY MOUTH DAILY 90 tablet 2  . B-D ULTRAFINE III SHORT PEN 31G X 8 MM MISC     . BRILINTA 60 MG TABS tablet Take 1 tablet (60 mg total) by mouth 2 (two) times daily. 180 tablet 2  . diphenhydrAMINE (BENADRYL) 25 mg capsule Take 25 mg by mouth every 6 (six) hours as needed for itching.    . dorzolamide-timolol (COSOPT) 22.3-6.8 MG/ML ophthalmic solution Place 1 drop into both eyes 2 (two) times daily.    Marland Kitchen doxycycline (VIBRA-TABS) 100 MG tablet Take 1 tablet (100 mg total) by mouth every 12 (twelve) hours. 14 tablet 0  . fenofibrate (TRICOR) 145 MG tablet Take 145 mg by mouth daily.    . furosemide (LASIX) 40 MG tablet Take 20-40 mg by mouth daily as needed for fluid or edema.     Marland Kitchen HYDROcodone-acetaminophen (NORCO) 5-325 MG tablet Take 1-2 tablets by mouth every 6 (six) hours as needed for moderate pain. 30 tablet 0  . LEVEMIR FLEXTOUCH 100 UNIT/ML Pen Inject 40 Units into the skin at bedtime.     . Methylcellulose, Laxative, (CITRUCEL PO) Take 1 tablet by mouth daily.    . metoprolol succinate (TOPROL-XL) 100 MG 24 hr tablet Take 1 tablet (100 mg total) by mouth daily. 30 tablet 5  . NOVOLOG FLEXPEN 100 UNIT/ML FlexPen Inject 14 Units into the skin 2 (two) times daily after a meal.    . travoprost, benzalkonium, (TRAVATAN) 0.004 % ophthalmic solution Place 1 drop into both eyes at bedtime.    Marland Kitchen VYTORIN 10-40 MG per tablet Take 1 tablet by mouth daily at 6 PM.      No facility-administered medications prior to visit.      Allergies:   Accupril [quinapril hcl]   Social History   Social History  . Marital status: Married    Spouse name: N/A  . Number of children: N/A  . Years of education: N/A   Social History Main Topics  . Smoking status: Former Smoker    Types: Cigarettes    Quit date: 06/04/1993  . Smokeless tobacco: Never Used     Comment: A PK WOULD LAST A WEEK  . Alcohol use 3.6 oz/week    2 Glasses of wine, 4 Cans of beer per week  . Drug use: No  . Sexual  activity: Not Currently   Other Topics Concern  . None   Social History Narrative   Married father of 3. Had been walking up to 2 miles every other day appetite is good his weight for about half an hour time period, but limited now due to back pain. Occasional alcohol. No tobacco products -- he quit about 20-30 years ago.     Family History:  The patient's family history includes Hypertension in his son.   Review of Systems:  Please see the history of present illness.     General:  No chills, fever, night sweats or weight changes.  Cardiovascular:  No chest pain, dyspnea on exertion, edema, orthopnea, palpitations, paroxysmal nocturnal dyspnea. Dermatological: No rash, lesions/masses Respiratory: No cough, dyspnea Urologic: No hematuria, dysuria Abdominal:   No nausea, vomiting, diarrhea, bright red blood per rectum, melena, or hematemesis Neurologic:  No visual changes, wkns, changes in mental status. Positive for left arm numbness.  All other systems reviewed and are otherwise negative except as noted above.   Physical Exam:    VS:  BP (!) 110/58   Pulse 64   Ht 6' (1.829 m)   Wt 273 lb (123.8 kg)   BMI 37.03 kg/m    General: Well developed, well nourished [place] American male appearing in no acute distress. Head: Normocephalic, atraumatic, sclera non-icteric, no xanthomas, nares are without discharge.  Neck: No carotid bruits. JVD not elevated.  Lungs: Respirations regular and unlabored, without wheezes or rales.  Heart: Regular rate and rhythm. No S3 or S4.  No murmur, no rubs, or gallops appreciated. Abdomen: Soft, non-tender, non-distended with normoactive bowel sounds. No hepatomegaly. No rebound/guarding. No obvious abdominal masses. Msk:  Strength and tone appear normal for age. No joint deformities or effusions. Extremities: No clubbing or cyanosis. No edema. Distal pedal pulses are 2+ bilaterally. Neuro: Alert and oriented X 3. Moves all extremities spontaneously.  No focal deficits noted. Upper extremity strength 5/5 bilaterally.  Psych:  Responds to questions appropriately with a normal affect. Skin: No rashes or lesions noted  Wt Readings from Last 3 Encounters:  01/11/17 273 lb (123.8 kg)  09/15/16 266 lb 9.6 oz (120.9 kg)  05/12/16 245 lb 6 oz (111.3 kg)     Studies/Labs Reviewed:   EKG:  EKG is ordered today. The ekg ordered today demonstrates NSR, HR 64, with no acute ST or T-wave changes when compared to prior tracings.   Recent Labs: 05/10/2016: ALT 20; B Natriuretic Peptide 18.5 05/12/2016: BUN 29; Creatinine, Ser 1.95; Hemoglobin 10.9; Platelets 177; Potassium 3.9; Sodium 140   Lipid Panel    Component Value Date/Time   CHOL 109 08/06/2014 1041   TRIG 106 08/06/2014 1041   HDL 32 (L) 08/06/2014 1041   CHOLHDL 3.4 08/06/2014 1041   VLDL 21 08/06/2014 1041   LDLCALC 56 08/06/2014 1041    Additional studies/ records that were reviewed today include:   Nuclear Stress Test: 07/2015 Exercise myocardial perfusion imaging study with no significant  ischemia Normal wall motion, EF estimated at 57% No EKG changes concerning for ischemia.  Low risk scan  Echocardiogram: 05/2016 Study Conclusions  - Left ventricle: The cavity size was normal. There was mild   concentric hypertrophy. Systolic function was normal. The   estimated ejection fraction was in the range of 60% to 65%. Wall   motion was normal; there were no regional wall motion   abnormalities. There was an increased relative contribution of   atrial contraction to ventricular filling. Doppler parameters are   consistent with abnormal left ventricular relaxation (grade 1   diastolic dysfunction). - Aortic valve: There was mild regurgitation. - Atrial septum: No defect or patent foramen ovale was identified.   Assessment:    1. CAD S/P percutaneous coronary angioplasty   2. Left arm numbness   3. Dyslipidemia, goal LDL below 70   4. Hypertension, essential   5. OSA  on CPAP   6. Abdominal aortic aneurysm (AAA) 3.0 cm to 5.5 cm  in diameter in male [facility])      Plan:   In order of problems listed above:  1. CAD  - s/p DES to PDA in 2011. Had a nuclear stress test in 07/2015 which was low-risk. - EKG today is without acute ischemic changes. - continue Brilinta, Toprol-XL, and Vytorin.   2. Left Arm Numbness - he describes two episodes of this which have occurred at rest. No associated chest discomfort or dyspnea. He nor his family members noticed any slurred speech, facial droop, or altered mental status with these episodes. - EKG today is without acute ischemic changes. He has been exercising 45-60 minutes daily without any exertional symptoms.  - unlikely to be cardiac in etiology. Would not pursue further ischemic evaluation with recent NST in 07/2015. Recommended to follow-up with PCP in regards to possible neuropathy.   3. Dyslipidemia - Lipid Panel in 2017 showed total cholesterol 97, triglycerides 169, HDL 26, and LDL 47.  - for repeat Lipid Panel by PCP next month.  - continue Vytorin.   4. Essential HTN - BP well-controlled at 110/58 during today's visit.  - continue current medication regimen.   5. OSA - reports good compliance with CPAP.   6. AAA - 4.5cm in 02/2016 - followed by CT Surgery  Medication Adjustments/Labs and Tests Ordered: Current medicines are reviewed at length with the patient today.  Concerns regarding medicines are outlined above.  Medication changes, Labs and Tests ordered today are listed in the Patient Instructions below. Patient Instructions  Medication Instructions:  Your physician recommends that you continue on your current medications as directed. Please refer to the Current Medication list given to you today.  Labwork: NONE  Testing/Procedures: NONE  Follow-Up: Your physician wants you to follow-up in: November with Dr. Ellyn Hack. You will receive a reminder letter in the mail two months in advance.  If you don't receive a letter, please call our office to schedule the follow-up appointment.  If you need a refill on your cardiac medications before your next appointment, please call your pharmacy.    Arna Medici, [place]  01/11/2017 4:48 PM    [facility] [place], [place], [place]  [address] Phone: [phone]; Fax: [phone]  [address] [place], [place] [address] Phone: [phone]

## 2017-01-10 ENCOUNTER — Telehealth: Payer: Self-pay | Admitting: *Deleted

## 2017-01-10 NOTE — Telephone Encounter (Signed)
Left message no change with current treatment or medications - any question may call back

## 2017-01-10 NOTE — Telephone Encounter (Signed)
-----   Message from Leonie Man, MD sent at 01/03/2017  1:34 PM EST ----- Recent Labs: 12/19/2016 Na+ 138, K+ 4.2, Cl- 102, HCO3- 22 , BUN 36, Cr 2.2, Glu 196 (high), Ca2+ 9.2; AST 25, ALT 28, AlkP 78, Alb 4.1, TP 7.7, T Bili 0.4 CBC: W 7.3, H/H 11.6/35.7, Plt 231;HgbA1c: 8.8 (improved from 9.2) Lipids not checked since February 2017 -NMR: total cholesterol is 97, triglycerides 169, HDL 26, LDL 47; HDL-P 24.6 (-low), HDL-C 24 (goal is> 39); LDL-P8 194 (goal) ( small LDL-P6 197 (high), LDL size 19.8 (borderline), LPL-IR 62 (high)  Glenetta Hew, MD

## 2017-01-11 ENCOUNTER — Encounter: Payer: Self-pay | Admitting: Student

## 2017-01-11 ENCOUNTER — Ambulatory Visit (INDEPENDENT_AMBULATORY_CARE_PROVIDER_SITE_OTHER): Payer: Medicare Other | Admitting: Student

## 2017-01-11 VITALS — BP 110/58 | HR 64 | Ht 72.0 in | Wt 273.0 lb

## 2017-01-11 DIAGNOSIS — G4733 Obstructive sleep apnea (adult) (pediatric): Secondary | ICD-10-CM

## 2017-01-11 DIAGNOSIS — Z9861 Coronary angioplasty status: Secondary | ICD-10-CM

## 2017-01-11 DIAGNOSIS — I2510 Atherosclerotic heart disease of native coronary artery without angina pectoris: Secondary | ICD-10-CM | POA: Diagnosis not present

## 2017-01-11 DIAGNOSIS — E785 Hyperlipidemia, unspecified: Secondary | ICD-10-CM

## 2017-01-11 DIAGNOSIS — R200 Anesthesia of skin: Secondary | ICD-10-CM | POA: Diagnosis not present

## 2017-01-11 DIAGNOSIS — Z9989 Dependence on other enabling machines and devices: Secondary | ICD-10-CM

## 2017-01-11 DIAGNOSIS — I714 Abdominal aortic aneurysm, without rupture, unspecified: Secondary | ICD-10-CM

## 2017-01-11 DIAGNOSIS — I10 Essential (primary) hypertension: Secondary | ICD-10-CM | POA: Diagnosis not present

## 2017-01-11 MED ORDER — NITROGLYCERIN 0.4 MG SL SUBL: 0.4000 mg | SUBLINGUAL_TABLET | SUBLINGUAL | 2 refills | Status: DC | PRN

## 2017-01-11 NOTE — Patient Instructions (Addendum)
Medication Instructions:  Your physician recommends that you continue on your current medications as directed. Please refer to the Current Medication list given to you today.  Labwork: NONE  Testing/Procedures: NONE  Follow-Up: Your physician wants you to follow-up in: November with Dr. Ellyn Hack. You will receive a reminder letter in the mail two months in advance. If you don't receive a letter, please call our office to schedule the follow-up appointment.   If you need a refill on your cardiac medications before your next appointment, please call your pharmacy.

## 2017-01-17 DIAGNOSIS — E118 Type 2 diabetes mellitus with unspecified complications: Secondary | ICD-10-CM | POA: Diagnosis not present

## 2017-01-17 DIAGNOSIS — Z125 Encounter for screening for malignant neoplasm of prostate: Secondary | ICD-10-CM | POA: Diagnosis not present

## 2017-01-17 DIAGNOSIS — E789 Disorder of lipoprotein metabolism, unspecified: Secondary | ICD-10-CM | POA: Diagnosis not present

## 2017-01-17 DIAGNOSIS — I10 Essential (primary) hypertension: Secondary | ICD-10-CM | POA: Diagnosis not present

## 2017-01-24 DIAGNOSIS — E118 Type 2 diabetes mellitus with unspecified complications: Secondary | ICD-10-CM | POA: Diagnosis not present

## 2017-01-24 DIAGNOSIS — C61 Malignant neoplasm of prostate: Secondary | ICD-10-CM | POA: Diagnosis not present

## 2017-01-24 DIAGNOSIS — I10 Essential (primary) hypertension: Secondary | ICD-10-CM | POA: Diagnosis not present

## 2017-01-24 DIAGNOSIS — G4733 Obstructive sleep apnea (adult) (pediatric): Secondary | ICD-10-CM | POA: Diagnosis not present

## 2017-02-05 ENCOUNTER — Telehealth: Payer: Self-pay | Admitting: Neurology

## 2017-02-05 NOTE — Telephone Encounter (Signed)
LMOM for pt. to call back for next available appt.  No sooner appt's at this time/fim

## 2017-02-05 NOTE — Telephone Encounter (Signed)
Patient walked in asking for an apt. Hasn't been seen since 2016. Has finger numbness. Did not want the next available in May. He wanted a call back from the nurse regarding a sooner apt. Best call back is [phone]

## 2017-02-22 ENCOUNTER — Other Ambulatory Visit: Payer: Self-pay | Admitting: Surgery

## 2017-02-22 DIAGNOSIS — I712 Thoracic aortic aneurysm, without rupture, unspecified: Secondary | ICD-10-CM

## 2017-03-13 DIAGNOSIS — G4730 Sleep apnea, unspecified: Secondary | ICD-10-CM | POA: Diagnosis not present

## 2017-03-13 DIAGNOSIS — B029 Zoster without complications: Secondary | ICD-10-CM | POA: Diagnosis not present

## 2017-03-13 DIAGNOSIS — E118 Type 2 diabetes mellitus with unspecified complications: Secondary | ICD-10-CM | POA: Diagnosis not present

## 2017-03-21 DIAGNOSIS — B029 Zoster without complications: Secondary | ICD-10-CM | POA: Diagnosis not present

## 2017-03-21 DIAGNOSIS — H401132 Primary open-angle glaucoma, bilateral, moderate stage: Secondary | ICD-10-CM | POA: Diagnosis not present

## 2017-03-22 DIAGNOSIS — H401132 Primary open-angle glaucoma, bilateral, moderate stage: Secondary | ICD-10-CM | POA: Diagnosis not present

## 2017-03-28 ENCOUNTER — Ambulatory Visit
Admission: RE | Admit: 2017-03-28 | Discharge: 2017-03-28 | Disposition: A | Discharge status: Home / Self Care | Payer: Medicare Other | Source: Ambulatory Visit | Attending: Surgery | Admitting: Surgery

## 2017-03-28 ENCOUNTER — Ambulatory Visit: Payer: Medicare Other | Admitting: Surgery

## 2017-03-28 ENCOUNTER — Inpatient Hospital Stay: Admission: RE | Admit: 2017-03-28 | Payer: Medicare Other | Source: Ambulatory Visit

## 2017-03-28 DIAGNOSIS — I712 Thoracic aortic aneurysm, without rupture, unspecified: Secondary | ICD-10-CM

## 2017-03-28 MED ORDER — IOPAMIDOL (ISOVUE-370) INJECTION 76%: 75.0000 mL | Freq: Once | INTRAVENOUS | Status: DC | PRN

## 2017-03-30 ENCOUNTER — Ambulatory Visit
Admission: RE | Admit: 2017-03-30 | Discharge: 2017-03-30 | Disposition: A | Discharge status: Home / Self Care | Payer: Medicare Other | Source: Ambulatory Visit | Attending: Surgery | Admitting: Surgery

## 2017-03-30 DIAGNOSIS — I712 Thoracic aortic aneurysm, without rupture: Secondary | ICD-10-CM | POA: Diagnosis not present

## 2017-04-03 DIAGNOSIS — L720 Epidermal cyst: Secondary | ICD-10-CM | POA: Diagnosis not present

## 2017-04-03 DIAGNOSIS — L258 Unspecified contact dermatitis due to other agents: Secondary | ICD-10-CM | POA: Diagnosis not present

## 2017-04-03 DIAGNOSIS — B029 Zoster without complications: Secondary | ICD-10-CM | POA: Diagnosis not present

## 2017-04-04 ENCOUNTER — Ambulatory Visit (INDEPENDENT_AMBULATORY_CARE_PROVIDER_SITE_OTHER): Payer: Medicare Other | Admitting: Surgery

## 2017-04-04 ENCOUNTER — Encounter: Payer: Self-pay | Admitting: Surgery

## 2017-04-04 VITALS — BP 126/73 | HR 76 | Resp 16 | Ht 72.0 in | Wt 260.0 lb

## 2017-04-04 DIAGNOSIS — I712 Thoracic aortic aneurysm, without rupture, unspecified: Secondary | ICD-10-CM

## 2017-04-04 DIAGNOSIS — I2510 Atherosclerotic heart disease of native coronary artery without angina pectoris: Secondary | ICD-10-CM

## 2017-04-04 DIAGNOSIS — Z9861 Coronary angioplasty status: Secondary | ICD-10-CM | POA: Diagnosis not present

## 2017-04-05 ENCOUNTER — Encounter: Payer: Self-pay | Admitting: Surgery

## 2017-04-05 NOTE — Progress Notes (Signed)
HPI:  The patient is a 71 year old gentleman with a history of coronary artery disease s/p PCI and stenting of the PDA in [place], type 2 DM, hypertension,, OSA on CPAP who has been followed by Dr. Ellyn Hack for his cardiology care. In February 2017 he was having some abdominal pain and had an MR of the abdomen which showed at least 4 solid, enhancing lesions in the left kidney suspicious for multiple small renal cell carcinomas. He underwent US biopsy which showed renal cell carcinoma and subsequently had a left nephrectomy. He did not require any additional treatment. He has a chest CT done at [facility] due to some chest pain and this showed an incidental 4.5 cm fusiform ascending aneurysm. He has been feeling well since his nephrectomy. He denies any chest or back pain.  Current Outpatient Prescriptions  Medication Sig Dispense Refill  . Albiglutide (TANZEUM) 50 MG PEN Inject 50 mg into the skin every Thursday.     Marland Kitchen allopurinol (ZYLOPRIM) 300 MG tablet Take 300 mg by mouth daily.     . AZOR 10-40 MG per tablet TAKE 1 TABLET BY MOUTH DAILY 90 tablet 2  . B-D ULTRAFINE III SHORT PEN 31G X 8 MM MISC     . BRILINTA 60 MG TABS tablet Take 1 tablet (60 mg total) by mouth 2 (two) times daily. 180 tablet 2  . COD LIVER OIL PO Take 1 capsule by mouth daily.    . diphenhydrAMINE (BENADRYL) 25 mg capsule Take 25 mg by mouth every 6 (six) hours as needed for itching.    . dorzolamide-timolol (COSOPT) 22.3-6.8 MG/ML ophthalmic solution Place 1 drop into both eyes 2 (two) times daily.    . fenofibrate (TRICOR) 145 MG tablet Take 145 mg by mouth daily.    . furosemide (LASIX) 40 MG tablet Take 20-40 mg by mouth daily as needed for fluid or edema.     Marland Kitchen LEVEMIR FLEXTOUCH 100 UNIT/ML Pen Inject 40 Units into the skin at bedtime.     . Methylcellulose, Laxative, (CITRUCEL PO) Take 1 tablet by mouth daily.    . nitroGLYCERIN (NITROSTAT) 0.4 MG SL tablet Place 1 tablet (0.4 mg total) under the  tongue every 5 (five) minutes as needed for chest pain. 25 tablet 2  . NOVOLOG FLEXPEN 100 UNIT/ML FlexPen Inject 14 Units into the skin 2 (two) times daily after a meal.    . travoprost, benzalkonium, (TRAVATAN) 0.004 % ophthalmic solution Place 1 drop into both eyes at bedtime.    Marland Kitchen VYTORIN 10-40 MG per tablet Take 1 tablet by mouth daily at 6 PM.      No current facility-administered medications for this visit.      Physical Exam: BP 126/73 (BP Location: Right Arm, Patient Position: Sitting, Cuff Size: Large)   Pulse 76   Resp 16   Ht 6' (1.829 m)   Wt 260 lb (117.9 kg)   SpO2 96% Comment: RA  BMI 35.26 kg/m  He looks well Cardiac exam shows a regular rate and rhythm with normal heart sounds. Lungs are clear  Diagnostic Tests:  CLINICAL DATA:  Thoracic aortic aneurysm  EXAM: CT ANGIOGRAPHY CHEST WITH CONTRAST  TECHNIQUE: Multidetector CT imaging of the chest was performed using the standard protocol during bolus administration of intravenous contrast. Multiplanar CT image reconstructions and MIPs were obtained to evaluate the vascular anatomy.  CONTRAST:  75 cc Isovue 370 IV  Creatinine was obtained on site at [facility]  at [address]Results: Creatinine 1.8 mg/dL.  COMPARISON:  01/18/2016  FINDINGS: Cardiovascular: Mild aneurysmal dilatation of the ascending thoracic aorta, 4.5 cm, stable. Borderline diameter of the distal aortic arch/ proximal descending thoracic aorta, 3.8 cm, stable. Scattered calcifications in the aortic arch and descending thoracic aorta. Extensive calcifications in the coronary arteries. Heart is normal size.  Mediastinum/Nodes: No mediastinal, hilar, or axillary adenopathy. Trachea and esophagus are unremarkable.  Lungs/Pleura: Lungs are clear. No focal airspace opacities or suspicious nodules. No effusions.  Upper Abdomen: Imaging into the upper abdomen shows no acute findings.  Musculoskeletal: Chest  wall soft tissues are unremarkable. No acute bony abnormality.  Review of the MIP images confirms the above findings.  IMPRESSION: Stable 4.5 cm ascending thoracic aortic aneurysm. Ascending thoracic aortic aneurysm. Recommend semi-annual imaging followup by CTA or MRA and referral to cardiothoracic surgery if not already obtained. This recommendation follows 2010 ACCF/AHA/AATS/ACR/ASA/SCA/SCAI/SIR/STS/SVM Guidelines for the Diagnosis and Management of Patients With Thoracic Aortic Disease. Circulation. 2010; 121: T614-E315   Electronically Signed   By: Rolm Baptise M.D.   On: 03/30/2017 09:20   Impression:  His ascending aortic aneurysm is stable at 4.5 cm. This does not require surgical treatment at this time. I reviewed the CT images with him and discussed the indications for surgery and the planned follow up. All of his questions have been answered. His BP is under good control and I discussed the importance of this.   Plan:  I will see him back in one year with a CTA of the chest.  I spent 15 minutes performing this established patient evaluation and > 50% of this time was spent face to face counseling and coordinating the care of this patient's ascending aortic aneurysm   Gaye Pollack, MD [facility] [phone]

## 2017-04-25 DIAGNOSIS — I10 Essential (primary) hypertension: Secondary | ICD-10-CM | POA: Diagnosis not present

## 2017-04-25 DIAGNOSIS — E118 Type 2 diabetes mellitus with unspecified complications: Secondary | ICD-10-CM | POA: Diagnosis not present

## 2017-04-25 DIAGNOSIS — E789 Disorder of lipoprotein metabolism, unspecified: Secondary | ICD-10-CM | POA: Diagnosis not present

## 2017-04-25 DIAGNOSIS — M109 Gout, unspecified: Secondary | ICD-10-CM | POA: Diagnosis not present

## 2017-04-27 ENCOUNTER — Telehealth: Payer: Self-pay | Admitting: Cardiology

## 2017-04-27 NOTE — Telephone Encounter (Signed)
Follow up    Pt returning call to [facility].

## 2017-04-27 NOTE — Telephone Encounter (Signed)
Pt he is no longer taking Bystolic,he wants to know should he be taking it?

## 2017-04-27 NOTE — Telephone Encounter (Signed)
S/w pt states that Dr Wilson Singer is retiring and saw this on pt's past medications and he told him to call and make sure that he was not supposed to be taking. He would like [place] to notify Dr Eugenio Hoes office that he is not supposed to be taking.

## 2017-04-27 NOTE — Telephone Encounter (Signed)
PATIENT WAS INITIAL ON BYSTOLIC THEN STOPPED DUE TO COST 3/ 2016. CHANGE TO METOPROLOL 100 MG DAILY. 04/04/17 APPOINTMENT WITH DR Cyndia Bent  - METOPROLOL WAS DISCONTINUED AT OFFICE VISIT

## 2017-04-27 NOTE — Telephone Encounter (Signed)
Lm2cb-searched all the OV notes in EPIC I not see anything about pt taking Bystolic. I do see a historical provider d/c 01-12-15 in medications tab

## 2017-05-06 HISTORY — PX: NM MYOVIEW LTD: HXRAD82

## 2017-05-10 DIAGNOSIS — C642 Malignant neoplasm of left kidney, except renal pelvis: Secondary | ICD-10-CM | POA: Diagnosis not present

## 2017-05-10 DIAGNOSIS — Z8546 Personal history of malignant neoplasm of prostate: Secondary | ICD-10-CM | POA: Diagnosis not present

## 2017-05-16 ENCOUNTER — Other Ambulatory Visit (HOSPITAL_COMMUNITY): Payer: Self-pay | Admitting: Urology

## 2017-05-16 ENCOUNTER — Ambulatory Visit (HOSPITAL_COMMUNITY)
Admission: RE | Admit: 2017-05-16 | Discharge: 2017-05-16 | Disposition: A | Discharge status: Home / Self Care | Payer: Medicare Other | Source: Ambulatory Visit | Attending: Urology | Admitting: Urology

## 2017-05-16 DIAGNOSIS — C649 Malignant neoplasm of unspecified kidney, except renal pelvis: Secondary | ICD-10-CM | POA: Insufficient documentation

## 2017-05-16 DIAGNOSIS — C642 Malignant neoplasm of left kidney, except renal pelvis: Secondary | ICD-10-CM | POA: Diagnosis not present

## 2017-05-16 DIAGNOSIS — C61 Malignant neoplasm of prostate: Secondary | ICD-10-CM | POA: Diagnosis not present

## 2017-05-27 ENCOUNTER — Emergency Department (HOSPITAL_COMMUNITY): Payer: Medicare Other

## 2017-05-27 ENCOUNTER — Observation Stay (HOSPITAL_BASED_OUTPATIENT_CLINIC_OR_DEPARTMENT_OTHER): Payer: Medicare Other

## 2017-05-27 ENCOUNTER — Observation Stay (HOSPITAL_COMMUNITY): Payer: Medicare Other

## 2017-05-27 ENCOUNTER — Encounter (HOSPITAL_COMMUNITY): Payer: Self-pay | Admitting: *Deleted

## 2017-05-27 ENCOUNTER — Observation Stay (HOSPITAL_COMMUNITY)
Admission: EM | Admit: 2017-05-27 | Discharge: 2017-05-27 | Disposition: A | Discharge status: Home / Self Care | Payer: Medicare Other | Attending: Cardiovascular Disease | Admitting: Cardiovascular Disease

## 2017-05-27 DIAGNOSIS — Z87891 Personal history of nicotine dependence: Secondary | ICD-10-CM | POA: Diagnosis not present

## 2017-05-27 DIAGNOSIS — I712 Thoracic aortic aneurysm, without rupture, unspecified: Secondary | ICD-10-CM

## 2017-05-27 DIAGNOSIS — M79602 Pain in left arm: Secondary | ICD-10-CM

## 2017-05-27 DIAGNOSIS — I700 Atherosclerosis of aorta: Secondary | ICD-10-CM

## 2017-05-27 DIAGNOSIS — N183 Chronic kidney disease, stage 3 unspecified: Secondary | ICD-10-CM

## 2017-05-27 DIAGNOSIS — R0789 Other chest pain: Secondary | ICD-10-CM

## 2017-05-27 DIAGNOSIS — Z8546 Personal history of malignant neoplasm of prostate: Secondary | ICD-10-CM | POA: Insufficient documentation

## 2017-05-27 DIAGNOSIS — Z79899 Other long term (current) drug therapy: Secondary | ICD-10-CM | POA: Diagnosis not present

## 2017-05-27 DIAGNOSIS — I2510 Atherosclerotic heart disease of native coronary artery without angina pectoris: Secondary | ICD-10-CM | POA: Diagnosis not present

## 2017-05-27 DIAGNOSIS — Z955 Presence of coronary angioplasty implant and graft: Secondary | ICD-10-CM | POA: Insufficient documentation

## 2017-05-27 DIAGNOSIS — Z85528 Personal history of other malignant neoplasm of kidney: Secondary | ICD-10-CM | POA: Insufficient documentation

## 2017-05-27 DIAGNOSIS — I10 Essential (primary) hypertension: Secondary | ICD-10-CM | POA: Insufficient documentation

## 2017-05-27 DIAGNOSIS — E119 Type 2 diabetes mellitus without complications: Secondary | ICD-10-CM | POA: Insufficient documentation

## 2017-05-27 DIAGNOSIS — M47812 Spondylosis without myelopathy or radiculopathy, cervical region: Secondary | ICD-10-CM | POA: Diagnosis not present

## 2017-05-27 DIAGNOSIS — M109 Gout, unspecified: Secondary | ICD-10-CM | POA: Insufficient documentation

## 2017-05-27 DIAGNOSIS — R200 Anesthesia of skin: Secondary | ICD-10-CM | POA: Insufficient documentation

## 2017-05-27 DIAGNOSIS — R079 Chest pain, unspecified: Principal | ICD-10-CM | POA: Diagnosis present

## 2017-05-27 DIAGNOSIS — Z794 Long term (current) use of insulin: Secondary | ICD-10-CM | POA: Diagnosis not present

## 2017-05-27 LAB — NM MYOCAR MULTI W/SPECT W/WALL MOTION / EF
CHL CUP MPHR: 149 {beats}/min
CSEPHR: 59 %
CSEPPHR: 88 {beats}/min
Estimated workload: 1 METS
Exercise duration (min): 5 min
Rest HR: 63 {beats}/min

## 2017-05-27 LAB — TROPONIN I: Troponin I: <0.03 ng/mL (ref <0.03)

## 2017-05-27 LAB — BASIC METABOLIC PANEL
ANION GAP: 10 (ref 5–15)
BUN: 36 mg/dL — ABNORMAL HIGH (ref 6–20)
CHLORIDE: 105 mmol/L (ref 101–111)
CO2: 20 mmol/L — AB (ref 22–32)
Calcium: 9.5 mg/dL (ref 8.9–10.3)
Creatinine, Ser: 2.22 mg/dL — ABNORMAL HIGH (ref 0.61–1.24)
GFR calc Af Amer: 33 mL/min — ABNORMAL LOW (ref >60)
GFR, EST NON AFRICAN AMERICAN: 28 mL/min — AB (ref >60)
GLUCOSE: 229 mg/dL — AB (ref 65–99)
POTASSIUM: 3.6 mmol/L (ref 3.5–5.1)
Sodium: 135 mmol/L (ref 135–145)

## 2017-05-27 LAB — LIPID PANEL
CHOL/HDL RATIO: 4.2 ratio
Cholesterol: 105 mg/dL (ref 0–200)
HDL: 25 mg/dL — AB (ref >40)
LDL CALC: 35 mg/dL (ref 0–99)
TRIGLYCERIDES: 224 mg/dL — AB (ref <150)
VLDL: 45 mg/dL — AB (ref 0–40)

## 2017-05-27 LAB — GLUCOSE, CAPILLARY
GLUCOSE-CAPILLARY: 231 mg/dL — AB (ref 65–99)
Glucose-Capillary: 281 mg/dL — ABNORMAL HIGH (ref 65–99)

## 2017-05-27 LAB — CBC
HEMATOCRIT: 37 % — AB (ref 39.0–52.0)
HEMOGLOBIN: 12.8 g/dL — AB (ref 13.0–17.0)
MCH: 30 pg (ref 26.0–34.0)
MCHC: 34.6 g/dL (ref 30.0–36.0)
MCV: 86.9 fL (ref 78.0–100.0)
Platelets: 179 10*3/uL (ref 150–400)
RBC: 4.26 MIL/uL (ref 4.22–5.81)
RDW: 14.6 % (ref 11.5–15.5)
WBC: 6.9 10*3/uL (ref 4.0–10.5)

## 2017-05-27 LAB — I-STAT TROPONIN, ED: TROPONIN I, POC: 0 ng/mL (ref 0.00–0.08)

## 2017-05-27 MED ORDER — TICAGRELOR 60 MG PO TABS
60.0000 mg | ORAL_TABLET | Freq: Two times a day (BID) | ORAL | Status: DC
  Administered 2017-05-27: 60 mg via ORAL
  Filled 2017-05-27 (×2): qty 1

## 2017-05-27 MED ORDER — TRAVOPROST 0.004 % OP SOLN: 1.0000 [drp] | Freq: Every day | OPHTHALMIC | Status: DC

## 2017-05-27 MED ORDER — TECHNETIUM TC 99M TETROFOSMIN IV KIT
30.0000 | PACK | Freq: Once | INTRAVENOUS | Status: AC | PRN
  Administered 2017-05-27: 30 via INTRAVENOUS

## 2017-05-27 MED ORDER — INSULIN DETEMIR 100 UNIT/ML FLEXPEN: 40.0000 [IU] | PEN_INJECTOR | Freq: Every day | SUBCUTANEOUS | Status: DC

## 2017-05-27 MED ORDER — REGADENOSON 0.4 MG/5ML IV SOLN
INTRAVENOUS | Status: AC
  Filled 2017-05-27: qty 5

## 2017-05-27 MED ORDER — TECHNETIUM TC 99M TETROFOSMIN IV KIT
10.0000 | PACK | Freq: Once | INTRAVENOUS | Status: AC | PRN
  Administered 2017-05-27: 10 via INTRAVENOUS

## 2017-05-27 MED ORDER — IRBESARTAN 150 MG PO TABS
300.0000 mg | ORAL_TABLET | Freq: Every day | ORAL | Status: DC
  Administered 2017-05-27: 300 mg via ORAL
  Filled 2017-05-27: qty 2

## 2017-05-27 MED ORDER — AMLODIPINE-OLMESARTAN 10-40 MG PO TABS: 1.0000 | ORAL_TABLET | Freq: Every day | ORAL | Status: DC

## 2017-05-27 MED ORDER — NITROGLYCERIN 0.4 MG SL SUBL: 0.4000 mg | SUBLINGUAL_TABLET | SUBLINGUAL | Status: DC | PRN

## 2017-05-27 MED ORDER — INSULIN ASPART 100 UNIT/ML ~~LOC~~ SOLN
14.0000 [IU] | Freq: Two times a day (BID) | SUBCUTANEOUS | Status: DC
  Administered 2017-05-27: 14 [IU] via SUBCUTANEOUS

## 2017-05-27 MED ORDER — ASPIRIN 81 MG PO CHEW
324.0000 mg | CHEWABLE_TABLET | Freq: Once | ORAL | Status: AC
  Administered 2017-05-27: 324 mg via ORAL
  Filled 2017-05-27: qty 4

## 2017-05-27 MED ORDER — AMLODIPINE BESYLATE 10 MG PO TABS
10.0000 mg | ORAL_TABLET | Freq: Every day | ORAL | Status: DC
  Administered 2017-05-27: 10 mg via ORAL
  Filled 2017-05-27: qty 1

## 2017-05-27 MED ORDER — FENOFIBRATE 160 MG PO TABS
160.0000 mg | ORAL_TABLET | Freq: Every day | ORAL | Status: DC
Start: 2017-05-27 — End: 2017-05-27
  Administered 2017-05-27: 160 mg via ORAL
  Filled 2017-05-27: qty 1

## 2017-05-27 MED ORDER — DORZOLAMIDE HCL-TIMOLOL MAL 2-0.5 % OP SOLN
1.0000 [drp] | Freq: Two times a day (BID) | OPHTHALMIC | Status: DC
  Filled 2017-05-27: qty 10

## 2017-05-27 MED ORDER — REGADENOSON 0.4 MG/5ML IV SOLN
0.4000 mg | Freq: Once | INTRAVENOUS | Status: AC
  Administered 2017-05-27: 0.4 mg via INTRAVENOUS
  Filled 2017-05-27: qty 5

## 2017-05-27 MED ORDER — EZETIMIBE-SIMVASTATIN 10-40 MG PO TABS
1.0000 | ORAL_TABLET | Freq: Every day | ORAL | Status: DC
  Filled 2017-05-27: qty 1

## 2017-05-27 MED ORDER — INSULIN ASPART 100 UNIT/ML ~~LOC~~ SOLN: 0.0000 [IU] | Freq: Every day | SUBCUTANEOUS | Status: DC

## 2017-05-27 MED ORDER — ASPIRIN EC 81 MG PO TBEC
81.0000 mg | DELAYED_RELEASE_TABLET | Freq: Every day | ORAL | Status: DC
  Administered 2017-05-27: 81 mg via ORAL
  Filled 2017-05-27: qty 1

## 2017-05-27 MED ORDER — INSULIN ASPART 100 UNIT/ML FLEXPEN
14.0000 [IU] | PEN_INJECTOR | Freq: Two times a day (BID) | SUBCUTANEOUS | Status: DC
  Filled 2017-05-27: qty 3

## 2017-05-27 MED ORDER — ALLOPURINOL 300 MG PO TABS
300.0000 mg | ORAL_TABLET | Freq: Every day | ORAL | Status: DC
  Administered 2017-05-27: 300 mg via ORAL
  Filled 2017-05-27: qty 1

## 2017-05-27 MED ORDER — INSULIN DETEMIR 100 UNIT/ML ~~LOC~~ SOLN
40.0000 [IU] | Freq: Every day | SUBCUTANEOUS | Status: DC
  Filled 2017-05-27: qty 0.4

## 2017-05-27 MED ORDER — INSULIN ASPART 100 UNIT/ML ~~LOC~~ SOLN
0.0000 [IU] | Freq: Three times a day (TID) | SUBCUTANEOUS | Status: DC
  Administered 2017-05-27: 8 [IU] via SUBCUTANEOUS
  Administered 2017-05-27: 5 [IU] via SUBCUTANEOUS

## 2017-05-27 MED ORDER — LATANOPROST 0.005 % OP SOLN
1.0000 [drp] | Freq: Every day | OPHTHALMIC | Status: DC
  Filled 2017-05-27: qty 2.5

## 2017-05-27 NOTE — ED Notes (Signed)
Patient transported to X-ray 

## 2017-05-27 NOTE — ED Provider Notes (Signed)
[facility] DEPT Provider Note   CSN: [id number] Arrival date & time: 05/27/17  0129  By signing my name below, I, Dyke Brackett, attest that this documentation has been prepared under the direction and in the presence of Ripley Fraise, MD . Electronically Signed: Dyke Brackett, Scribe. 05/27/2017. 2:32 AM.   History   Chief Complaint Chief Complaint  Patient presents with  . Chest Pain    HPI Jacob Berger is a 71 y.o. male with a history of thoracic aortic aneurysm, CAD, DM and HTN who presents to the Emergency Department complaining of gradually improving left-sided chest pain with radiation to his left arm onset tonight. He reports associated chest palpitations, diaphoresis, neck pain, numbness to left arm and left upper leg, mild headache, and blurred vision. No alleviating or modifying factors noted. Per pt, this is the third time this week he has experienced these symptoms. He has not been evaluated for this pain this week prior to tonight. No recent fall or injury. Pt denies any shortness of breath, fever, vomiting, abdominal pain, or facial numbness.   The history is provided by the patient. No language interpreter was used.  Chest Pain   This is a recurrent problem. The current episode started 6 to 12 hours ago. The problem occurs constantly. The problem has been gradually improving. The pain is at a severity of 3/10. The pain is mild. The pain radiates to the left arm. Associated symptoms include diaphoresis, headaches and numbness. Pertinent negatives include no abdominal pain, no fever, no shortness of breath and no vomiting. He has tried nothing for the symptoms. Risk factors include male gender and obesity.  His past medical history is significant for aortic aneurysm, CAD, cancer and diabetes.   Past Medical History:  Diagnosis Date  . Arthritis    "knees" (05/10/2016)  . CAD S/P percutaneous coronary angioplasty 2011   a. 2011: s/p PCI to PDA with Endeavor 2.5 mm x 12  mm DES - [place] b. low-risk NST in 07/2015  . Cancer of kidney ([place])   . Complication of anesthesia   . Daily headache    "recently" (05/10/2016)  . Dyslipidemia, goal LDL below 70   . Glaucoma   . Gout   . Hypertension, essential   . Obesity, Class II, BMI 35-39.9, with comorbidity    BMI 37  . OSA on CPAP   . PONV (postoperative nausea and vomiting)    ponv after colonscopy  . Prostate cancer ([place]) 2007  . Rash since 04-08-16   on neck healing, right arm improving  . Thoracic aortic aneurysm without rupture ([place]) - Noted on chest CT. 4.5 cm 02/15/2016   4.5 cm per dr Cyndia Bent note and [facility] 01-18-16  . Type II diabetes mellitus [facility])     Patient Active Problem List   Diagnosis Date Noted  . AKI (acute kidney injury) ([place]) 05/10/2016  . Loss of balance 05/10/2016  . Type 2 diabetes mellitus ([place]) 05/10/2016  . Peripheral edema 05/10/2016  . Stroke-like symptoms 05/10/2016  . Disequilibrium   . Cellulitis of left lower extremity   . Neoplasm of left kidney 04/13/2016  . Thoracic aortic aneurysm without rupture ([place]) - Noted on chest CT. 4.5 cm 02/15/2016  . Other headache syndrome 04/29/2015  . Right arm numbness 04/28/2015  . Right carpal tunnel syndrome 04/28/2015  . Right cervical radiculopathy 04/28/2015  . Obesity (BMI 30-39.9) 06/05/2013  . Dyspnea - likely due to increasing obesity 06/05/2013  .  CAD S/P percutaneous coronary angioplasty   . Hypertension, essential   . OSA on CPAP   . Diabetes mellitus, type II, insulin dependent ([place])   . Dyslipidemia, goal LDL below 70     Past Surgical History:  Procedure Laterality Date  . COLONOSCOPY W/ BIOPSIES AND POLYPECTOMY  "several"  . CORONARY ANGIOPLASTY WITH STENT PLACEMENT  2001   [place]: Endeavor 2.5 mm x 12 mm DES - distal PDA ([facility])  . LAPAROSCOPIC NEPHRECTOMY Left 04/13/2016   Procedure: LAPAROSCOPIC RADICAL LEFT NEPHRECTOMY;  Surgeon: Raynelle Bring, MD;   Location: [facility] ORS;  Service: Urology;  Laterality: Left;  . NM MYOVIEW LTD  June 2013; Sep 2016   a. Treadmill Myoview: 10 minutes, 12 METS; no ischemia infarction, EF 65%; b. [facility]: LOW RISK. EF 57%. 10.4 METS low risk scan no ischemia. No wall motion abnormality.   Marland Kitchen PROSTATECTOMY    . REFRACTIVE SURGERY Left        Home Medications    Prior to Admission medications   Medication Sig Start Date End Date Taking? Authorizing Provider  Albiglutide (TANZEUM) 50 MG PEN Inject 50 mg into the skin every Thursday.     [provider]  allopurinol (ZYLOPRIM) 300 MG tablet Take 300 mg by mouth daily.     [provider]  AZOR 10-40 MG per tablet TAKE 1 TABLET BY MOUTH DAILY 12/18/13   Leonie Man, MD  B-D ULTRAFINE III SHORT PEN 31G X 8 MM MISC  02/23/15   [provider]  BRILINTA 60 MG TABS tablet Take 1 tablet (60 mg total) by mouth 2 (two) times daily. 01/08/17   Leonie Man, MD  COD LIVER OIL PO Take 1 capsule by mouth daily.    [provider]  diphenhydrAMINE (BENADRYL) 25 mg capsule Take 25 mg by mouth every 6 (six) hours as needed for itching.    [provider]  dorzolamide-timolol (COSOPT) 22.3-6.8 MG/ML ophthalmic solution Place 1 drop into both eyes 2 (two) times daily.    [provider]  fenofibrate (TRICOR) 145 MG tablet Take 145 mg by mouth daily.    [provider]  furosemide (LASIX) 40 MG tablet Take 20-40 mg by mouth daily as needed for fluid or edema.     [provider]  LEVEMIR FLEXTOUCH 100 UNIT/ML Pen Inject 40 Units into the skin at bedtime.  01/04/16   [provider]  Methylcellulose, Laxative, (CITRUCEL PO) Take 1 tablet by mouth daily.    [provider]  nitroGLYCERIN (NITROSTAT) 0.4 MG SL tablet Place 1 tablet (0.4 mg total) under the tongue every 5 (five) minutes as needed for chest pain. 01/11/17 04/11/17  Strader, Fransisco Hertz, PA-C  NOVOLOG FLEXPEN 100 UNIT/ML FlexPen Inject  14 Units into the skin 2 (two) times daily after a meal. 08/10/16   [provider]  travoprost, benzalkonium, (TRAVATAN) 0.004 % ophthalmic solution Place 1 drop into both eyes at bedtime.    [provider]  VYTORIN 10-40 MG per tablet Take 1 tablet by mouth daily at 6 PM.  04/27/15   [provider]    Family History Family History  Problem Relation Age of Onset  . Hypertension Son     Social History Social History  Substance Use Topics  . Smoking status: Former Smoker    Types: Cigarettes    Quit date: 06/04/1993  . Smokeless tobacco: Never Used     Comment: A PK WOULD LAST  A WEEK  . Alcohol use 3.6 oz/week    2 Glasses of wine, 4 Cans of beer per week     Allergies   Accupril [quinapril hcl]   Review of Systems Review of Systems  Constitutional: Positive for diaphoresis. Negative for fever.  Eyes: Positive for visual disturbance.  Respiratory: Negative for shortness of breath.   Cardiovascular: Positive for chest pain.  Gastrointestinal: Negative for abdominal pain and vomiting.  Musculoskeletal: Positive for myalgias.  Neurological: Positive for numbness and headaches.  All other systems reviewed and are negative.  Physical Exam Updated Vital Signs BP (!) 146/82 (BP Location: Right Arm)   Pulse 75   Temp 97.7 F (36.5 C) (Oral)   Resp 20   Ht 6' (1.829 m)   Wt 260 lb (117.9 kg)   SpO2 99%   BMI 35.26 kg/m   Physical Exam CONSTITUTIONAL: Well developed/well nourished HEAD: Normocephalic/atraumatic EYES: EOMI/PERRL ENMT: Mucous membranes moist NECK: supple no meningeal signs SPINE/BACK: Mild cervical spine tenderness  CV: S1/S2 noted, no murmurs/rubs/gallops noted LUNGS: Lungs are clear to auscultation bilaterally, no apparent distress ABDOMEN: soft, nontender, no rebound or guarding, bowel sounds noted throughout abdomen GU:no cva tenderness NEURO: Pt is awake/alert/appropriate, moves all extremitiesx4.  No facial droop.  No  arm or leg drift. No focal motor deficits in upper or lower extremities. Appropriate power noted throughtout extremities  Reports mild numbness to left forearm only EXTREMITIES: pulses normal/equalx4, full ROM SKIN: warm, color normal PSYCH: no abnormalities of mood noted, alert and oriented to situation   ED Treatments / Results  DIAGNOSTIC STUDIES:  Oxygen Saturation is 99% on RA, normal by my interpretation.    COORDINATION OF CARE:  2:25 AM Discussed treatment plan with pt at bedside and pt agreed to plan.   Labs (all labs ordered are listed, but only abnormal results are displayed) Labs Reviewed  BASIC METABOLIC PANEL - Abnormal; Notable for the following:       Result Value   CO2 20 (*)    Glucose, Bld 229 (*)    BUN 36 (*)    Creatinine, Ser 2.22 (*)    GFR calc non Af Amer 28 (*)    GFR calc Af Amer 33 (*)    All other components within normal limits  CBC - Abnormal; Notable for the following:    Hemoglobin 12.8 (*)    HCT 37.0 (*)    All other components within normal limits  TROPONIN I    EKG  EKG Interpretation  Date/Time:  Sunday May 27 2017 01:36:44 EDT Ventricular Rate:  79 PR Interval:  154 QRS Duration: 88 QT Interval:  378 QTC Calculation: 433 R Axis:   18 Text Interpretation:  Normal sinus rhythm Cannot rule out Anterior infarct , age undetermined Abnormal ECG Confirmed by Ripley Fraise [phone]) on 05/27/2017 2:16:10 AM       Radiology Dg Chest 2 View  Result Date: 05/27/2017 CLINICAL DATA:  Chest pain.  Left arm numbness. EXAM: CHEST  2 VIEW COMPARISON:  Radiograph 05/16/2017.  CT 03/30/2017 FINDINGS: Unchanged heart size and mediastinal contours with tortuosity atherosclerosis of the thoracic aorta. No pulmonary edema, focal airspace disease, pleural effusion or pneumothorax. No acute osseous abnormalities. IMPRESSION: 1. No acute abnormality. 2. Unchanged aortic tortuosity and atherosclerosis. Electronically Signed   By: Jeb Levering M.D.    On: 05/27/2017 02:33   Ct Chest Wo Contrast  Result Date: 05/27/2017 CLINICAL DATA:  Aortic aneurysm and chest pain. EXAM: CT CHEST WITHOUT  CONTRAST TECHNIQUE: Multidetector CT imaging of the chest was performed following the standard protocol without IV contrast. COMPARISON:  Chest CTA 03/30/2017 FINDINGS: Cardiovascular: Examination was performed without IV contrast, limiting intraluminal assessment of the vessels. The ascending aortic aneurysm is unchanged in size measuring approximately 4.6 cm. There is atherosclerotic calcification in the aorta. There is no intramural hematoma. No aneurysm rupture. There are coronary artery calcifications. Heart size is normal. No pericardial effusion. Mediastinum/Nodes: No enlarged mediastinal or axillary lymph nodes. Thyroid gland, trachea, and esophagus demonstrate no significant findings. Lungs/Pleura: Lungs are clear. No pleural effusion or pneumothorax. Upper Abdomen: No acute abnormality. Musculoskeletal: No chest wall mass or suspicious bone lesions identified. IMPRESSION: 1. Unchanged size of ascending thoracic aortic aneurysm without intramural hematoma or evidence of rupture. Recommend semi-annual imaging followup by CTA or MRA and referral to cardiothoracic surgery if not already obtained. This recommendation follows 2010 ACCF/AHA/AATS/ACR/ASA/SCA/SCAI/SIR/STS/SVM Guidelines for the Diagnosis and Management of Patients With Thoracic Aortic Disease. Circulation. 2010; 121: E185-T093 2. Coronary artery and Aortic Atherosclerosis (ICD10-I70.0). Electronically Signed   By: Ulyses Jarred M.D.   On: 05/27/2017 04:05    Procedures Procedures    Medications Ordered in ED Medications  aspirin chewable tablet 324 mg (324 mg Oral Given 05/27/17 0435)     Initial Impression / Assessment and Plan / ED Course  I have reviewed the triage vital signs and the nursing notes.  Pertinent labs & imaging results that were available during my care of the patient were  reviewed by me and considered in my medical decision making (see chart for details).     3:22 AM Pt with known h/o CAD as well as thoracic aneurysm Due to CP and reports numbness, will need evaluation of aorta Due to elevated creatinine, will need CT noncontrast D/w radiology and they recommend started with CT noncontrast Pt stable at this time No focal neuro weakness noted on my exam 4:38 AM CT imaging unchanged After review of chart and further discussion, pt reports left arm numbness occurs frequently and has been documented in previous cardiology visits He reports the chest pain is new for him, with about 3 episodes in past week, known h/o CAD  All other symptoms resolved, reports numbness improved I doubt stroke at this time as cause of numbness  Due to complexity of illness, I have consulted cardiology for evaluation and potential admission  Final Clinical Impressions(s) / ED Diagnoses   Final diagnoses:  Chest pain, rule out acute myocardial infarction  Left arm numbness  Thoracic aortic aneurysm without rupture ([place])    New Prescriptions New Prescriptions   No medications on file   I personally performed the services described in this documentation, which was scribed in my presence. The recorded information has been reviewed and is accurate.        Ripley Fraise, MD 05/27/17 [phone]

## 2017-05-27 NOTE — Progress Notes (Signed)
Progress Note  Patient Name: Jacob Berger Date of Encounter: 05/27/2017  Primary Cardiologist: Ellyn Hack  Subjective   No chest pain, or dyspnea  Inpatient Medications    Scheduled Meds: . regadenoson      . allopurinol  300 mg Oral Daily  . amLODipine  10 mg Oral Daily  . aspirin EC  81 mg Oral Daily  . dorzolamide-timolol  1 drop Both Eyes BID  . ezetimibe-simvastatin  1 tablet Oral q1800  . fenofibrate  160 mg Oral Daily  . insulin aspart  0-15 Units Subcutaneous TID WC  . insulin aspart  0-5 Units Subcutaneous QHS  . insulin aspart  14 Units Subcutaneous BID WC  . insulin detemir  40 Units Subcutaneous QHS  . irbesartan  300 mg Oral Daily  . latanoprost  1 drop Both Eyes QHS  . regadenoson  0.4 mg Intravenous Once  . ticagrelor  60 mg Oral BID   Continuous Infusions:  PRN Meds: nitroGLYCERIN   Vital Signs    Vitals:   05/27/17 0600 05/27/17 0615 05/27/17 0637 05/27/17 0859  BP: 112/67 125/63 128/65 121/64  Pulse: 70 65 70   Resp: 13 11 14    Temp:   97.8 F (36.6 C)   TempSrc:   Oral   SpO2: 97% 95% 98%   Weight:      Height:        Intake/Output Summary (Last 24 hours) at 05/27/17 0908 Last data filed at 05/27/17 0454  Gross per 24 hour  Intake                0 ml  Output              800 ml  Net             -800 ml   Filed Weights   05/27/17 0151  Weight: 260 lb (117.9 kg)    Telemetry    SR - Personally Reviewed  ECG    SR - Personally Reviewed  Physical Exam   General: Well developed, well nourished, male appearing in no acute distress. Head: Normocephalic, atraumatic.  Neck: Supple without bruits, JVD. Lungs:  Resp regular and unlabored, CTA. Heart: RRR, S1, S2, no S3, S4, or murmur; no rub. Abdomen: Soft, non-tender, non-distended with normoactive bowel sounds. No hepatomegaly. No rebound/guarding. No obvious abdominal masses. Extremities: No clubbing, cyanosis, mild LE edema. Distal pedal pulses are 2+ bilaterally. Neuro: Alert  and oriented X 3. Moves all extremities spontaneously. Psych: Normal affect.  Labs    Chemistry Recent Labs Lab 05/27/17 0151  NA 135  K 3.6  CL 105  CO2 20*  GLUCOSE 229*  BUN 36*  CREATININE 2.22*  CALCIUM 9.5  GFRNONAA 28*  GFRAA 33*  ANIONGAP 10     Hematology Recent Labs Lab 05/27/17 0151  WBC 6.9  RBC 4.26  HGB 12.8*  HCT 37.0*  MCV 86.9  MCH 30.0  MCHC 34.6  RDW 14.6  PLT 179    Cardiac Enzymes Recent Labs Lab 05/27/17 0151  TROPONINI <0.03    Recent Labs Lab 05/27/17 0605  TROPIPOC 0.00    Radiology    Dg Chest 2 View  Result Date: 05/27/2017 CLINICAL DATA:  Chest pain.  Left arm numbness. EXAM: CHEST  2 VIEW COMPARISON:  Radiograph 05/16/2017.  CT 03/30/2017 FINDINGS: Unchanged heart size and mediastinal contours with tortuosity atherosclerosis of the thoracic aorta. No pulmonary edema, focal airspace disease, pleural effusion or pneumothorax. No acute osseous abnormalities.  IMPRESSION: 1. No acute abnormality. 2. Unchanged aortic tortuosity and atherosclerosis. Electronically Signed   By: Jeb Levering M.D.   On: 05/27/2017 02:33   Ct Chest Wo Contrast  Result Date: 05/27/2017 CLINICAL DATA:  Aortic aneurysm and chest pain. EXAM: CT CHEST WITHOUT CONTRAST TECHNIQUE: Multidetector CT imaging of the chest was performed following the standard protocol without IV contrast. COMPARISON:  Chest CTA 03/30/2017 FINDINGS: Cardiovascular: Examination was performed without IV contrast, limiting intraluminal assessment of the vessels. The ascending aortic aneurysm is unchanged in size measuring approximately 4.6 cm. There is atherosclerotic calcification in the aorta. There is no intramural hematoma. No aneurysm rupture. There are coronary artery calcifications. Heart size is normal. No pericardial effusion. Mediastinum/Nodes: No enlarged mediastinal or axillary lymph nodes. Thyroid gland, trachea, and esophagus demonstrate no significant findings.  Lungs/Pleura: Lungs are clear. No pleural effusion or pneumothorax. Upper Abdomen: No acute abnormality. Musculoskeletal: No chest wall mass or suspicious bone lesions identified. IMPRESSION: 1. Unchanged size of ascending thoracic aortic aneurysm without intramural hematoma or evidence of rupture. Recommend semi-annual imaging followup by CTA or MRA and referral to cardiothoracic surgery if not already obtained. This recommendation follows 2010 ACCF/AHA/AATS/ACR/ASA/SCA/SCAI/SIR/STS/SVM Guidelines for the Diagnosis and Management of Patients With Thoracic Aortic Disease. Circulation. 2010; 121: W431-V400 2. Coronary artery and Aortic Atherosclerosis (ICD10-I70.0). Electronically Signed   By: Ulyses Jarred M.D.   On: 05/27/2017 04:05    Cardiac Studies   N/A  Patient Profile     71 y.o. male / past medical history of CAD (s/p DES to PDA in 2011, low-risk NST in 07/2015), HTN, HLD, OSA (on CPAP), renal cell cancer (s/p nephrectomy in 04/2016) and thoracic aortic aneurysm (followed by CT Surgery, at 4.5cm in 02/2016) who presented with chest discomfort.  Assessment & Plan    1. Chest discomfort: pt w/ h/o PCI to RCA territory now w/ new/worsening non-exertional chest discomfort x 2 wk. Trop negx2, EKG has some mild TWI in inferior leads that is new compared to prior but may be nonspecific. Seen in nuc med for stress test. No chest pain, tolerated well. Images pending.  -- echo pending  2. Left arm numbness: chronic; no recent change in sx. Getting nuc stress as above   3. Dyslipidemia, goal LDL below 70: cont current regimen  4. Hypertension, essential: controlled; cont current regimen  5. OSA on CPAP: cont CPAP as per PCP  6. Abdominal aortic aneurysm (AAA) 3.0 cm to 5.5 cm in diameter in male: CTA done by ED shows aneurysm is stable from prior   7. DM2: cont home regimen  8. CKD: Cr appears around baseline from earlier this month.   Signed, Reino Bellis, NP  05/27/2017, 9:08 AM     Patient seen and examined.  Chart reviewed.  He was admitted last night with predominantly neck pain and left arm pain.  I could not get a great history of chest pain.  He describes 3 separate episodes usually not occurring with exertion one occurred while he was opening a door with his arm the other occurred when he was stretching out and a third occurred last night it was associated with fairly severe pain in the posterior part of his neck radiating down to his arm.  He has had similar episodes of arm numbness for some time.  He also describes some numbness on the left side of his chest and even pointed to his thigh area.  He had a myocardial perfusion scan done earlier today that showed no  ischemia.  He notes when he had his stent placed in [place] and number of years ago that he had some pain involving his right hand at that time.  1.  This sounds more like cervical disc disease rather than a cardiac type pain to me.  His myocardial perfusion scan is low risk.  Recommendations:  He is in transition with his PCP at this time.  I would recommend that we get a CT scan of his neck without contrast and he can be discharged to follow this up with his primary doctor.  Kerry Hough MD [facility] 1:44 PM

## 2017-05-27 NOTE — ED Triage Notes (Signed)
The pt is c/o lt arm numbness lt chest pain and lt neck pain.  This is the third time this week  It has gotten better since he arrived here tonight.  Hx of the same type problems in the past

## 2017-05-27 NOTE — Discharge Summary (Signed)
Discharge Summary    Patient ID: Jacob Berger,  MRN: [id number], DOB/AGE: 1946/09/10 71 y.o.  Admit date: 05/27/2017 Discharge date: 05/27/2017  Primary Care Provider: Anda Kraft Primary Cardiologist: Ellyn Hack  Discharge Diagnoses    Active Problems:   Kidney disease, chronic, stage III (GFR 30-59 ml/min)   Aortic atherosclerosis ([place])  1.  Chest pain of undetermined etiology-myocardial infarction ruled out 2.  CAD with previous stenting of the right coronary artery 3.  Left arm numbness possible cervical disc disease 4.  Hyperlipidemia 5.  Hypertensive heart disease 6.  Abdominal aneurysm 7.  Type 2 diabetes mellitus 8.  Chronic kidney disease  Allergies Allergies  Allergen Reactions  . Accupril [Quinapril Hcl] Swelling and Other (See Comments)    Mouth swelling    Diagnostic Studies/Procedures    Stress Test: 05/27/17  FINDINGS: Perfusion: No decreased activity in the left ventricle on stress imaging to suggest reversible ischemia or infarction.  Wall Motion: Normal left ventricular wall motion. No left ventricular dilation.  Left Ventricular Ejection Fraction: 58 %  End diastolic volume 625 ml  End systolic volume 46 ml  IMPRESSION: 1. No reversible ischemia or infarction.  2. Normal left ventricular wall motion.  3. Left ventricular ejection fraction 58%  4. Non invasive risk stratification*: Low _____________  CT cervical spine  IMPRESSION: Degenerative spondylosis and facet arthropathy on the left at C4-5 with left foraminal stenosis that could compress the left C5 nerve.  History of Present Illness     Jacob Berger is a 71 y.o. male w/ past medical history of CAD (s/p DES to PDA in 2011, low-risk NST in 07/2015), HTN, HLD, OSA (on CPAP), renal cell cancer (s/p nephrectomy in 04/2016) and thoracic aortic aneurysm (followed by CT Surgery, at 4.5cm in 02/2016) who presented for evaluation of left sided chest discomfort that has been  going on intermittently for the past week or so. Pt said onset of pain was random, not necessarily tied to exertion. He has had 3 major episodes of the discomfort within the past week, most recently the day of admission. The discomfort was a pressure in the center to left side of the chest associated with a feeling of "numbness" in the chest as well as L arm. There was associated diaphoresis but no other associated sx. The discomfort will last about 10 min or so prior to easing off over time. He had not tried taking any NTG. He said these sx are very similar to prior angina he had before his PCI to PDA in 2011 in [place]. Initial troponin was negative. He was admitted for observation and stress testing.   Hospital Course     Trop neg x3. Underwent Lexiscan myoview without any complications that was noted as low risk. No further chest pain reported. Blood pressure was well controlled. Labs stable during admission. Lipids well controlled. In further discussion with the patient, it was felt that his pain may be more related to cervical disc disease rather than cardiac pain. He had a CT neck that showed degenerative spondylosis and facet arthropathy on the left at C4-5 with left foraminal stenosis that could compress the left C5 nerve . He was encouraged to follow up with a PCP in the outpatient setting.   H was seen by Dr. Wynonia Lawman and determined stable for discharge home. Follow up noted below. Medications listed below.  _____________  Discharge Vitals Blood pressure 118/60, pulse 67, temperature 98.9 F (37.2 C), temperature source Oral, resp. rate 19,  height 6' (1.829 m), weight 260 lb (117.9 kg), SpO2 100 %.  Filed Weights   05/27/17 0151  Weight: 260 lb (117.9 kg)    Labs & Radiologic Studies    CBC  Recent Labs  05/27/17 0151  WBC 6.9  HGB 12.8*  HCT 37.0*  MCV 86.9  PLT 956   Basic Metabolic Panel  Recent Labs  05/27/17 0151  NA 135  K 3.6  CL 105  CO2 20*  GLUCOSE 229*  BUN 36*    CREATININE 2.22*  CALCIUM 9.5   Liver Function Tests No results for input(s): AST, ALT, ALKPHOS, BILITOT, PROT, ALBUMIN in the last 72 hours. No results for input(s): LIPASE, AMYLASE in the last 72 hours. Cardiac Enzymes  Recent Labs  05/27/17 0151  TROPONINI <0.03   Fasting Lipid Panel  Recent Labs  05/27/17 0539  CHOL 105  HDL 25*  LDLCALC 35  TRIG 224*  CHOLHDL 4.2  _____________  Dg Chest 2 View  Result Date: 05/27/2017 CLINICAL DATA:  Chest pain.  Left arm numbness. EXAM: CHEST  2 VIEW COMPARISON:  Radiograph 05/16/2017.  CT 03/30/2017 FINDINGS: Unchanged heart size and mediastinal contours with tortuosity atherosclerosis of the thoracic aorta. No pulmonary edema, focal airspace disease, pleural effusion or pneumothorax. No acute osseous abnormalities. IMPRESSION: 1. No acute abnormality. 2. Unchanged aortic tortuosity and atherosclerosis. Electronically Signed   By: Jeb Levering M.D.   On: 05/27/2017 02:33   Dg Chest 2 View  Result Date: 05/16/2017 CLINICAL DATA:  Renal cell carcinoma. EXAM: CHEST  2 VIEW COMPARISON:  CT chest 03/30/2017 FINDINGS: The heart size and mediastinal contours are within normal limits. Both lungs are clear. The visualized skeletal structures are unremarkable. IMPRESSION: No active cardiopulmonary disease. Electronically Signed   By: Kathreen Devoid   On: 05/16/2017 10:31   Ct Chest Wo Contrast  Result Date: 05/27/2017 CLINICAL DATA:  Aortic aneurysm and chest pain. EXAM: CT CHEST WITHOUT CONTRAST TECHNIQUE: Multidetector CT imaging of the chest was performed following the standard protocol without IV contrast. COMPARISON:  Chest CTA 03/30/2017 FINDINGS: Cardiovascular: Examination was performed without IV contrast, limiting intraluminal assessment of the vessels. The ascending aortic aneurysm is unchanged in size measuring approximately 4.6 cm. There is atherosclerotic calcification in the aorta. There is no intramural hematoma. No aneurysm  rupture. There are coronary artery calcifications. Heart size is normal. No pericardial effusion. Mediastinum/Nodes: No enlarged mediastinal or axillary lymph nodes. Thyroid gland, trachea, and esophagus demonstrate no significant findings. Lungs/Pleura: Lungs are clear. No pleural effusion or pneumothorax. Upper Abdomen: No acute abnormality. Musculoskeletal: No chest wall mass or suspicious bone lesions identified. IMPRESSION: 1. Unchanged size of ascending thoracic aortic aneurysm without intramural hematoma or evidence of rupture. Recommend semi-annual imaging followup by CTA or MRA and referral to cardiothoracic surgery if not already obtained. This recommendation follows 2010 ACCF/AHA/AATS/ACR/ASA/SCA/SCAI/SIR/STS/SVM Guidelines for the Diagnosis and Management of Patients With Thoracic Aortic Disease. Circulation. 2010; 121: O130-Q657 2. Coronary artery and Aortic Atherosclerosis (ICD10-I70.0). Electronically Signed   By: Ulyses Jarred M.D.   On: 05/27/2017 04:05   Ct Cervical Spine Wo Contrast  Result Date: 05/27/2017 CLINICAL DATA:  Neck pain and left arm tingling and numbness, greater than 1 year duration. EXAM: CT CERVICAL SPINE WITHOUT CONTRAST TECHNIQUE: Multidetector CT imaging of the cervical spine was performed without intravenous contrast. Multiplanar CT image reconstructions were also generated. COMPARISON:  None. FINDINGS: Alignment: Normal except for 1 mm anterolisthesis C7-T1. Skull base and vertebrae: No primary bone lesion. Soft  tissues and spinal canal: Negative Disc levels: Foramen magnum widely patent. Ordinary mild osteoarthritis at the C1-2 articulation but no stenosis. C2-3: Chronic fusion.  Wide patency of the canal and foramina. C3-4:  Chronic fusion.  Wide patency of the canal and foramina. C4-5: Spondylosis with endplate osteophytes and left-sided facet arthropathy. Left foraminal stenosis that could compress the C5 nerve. C5-6: Mild spondylosis. No significant canal or foraminal  narrowing. C6-7:  No canal or foraminal stenosis. C7-T1: Facet osteoarthritis with 1 mm anterolisthesis. No compressive canal or foraminal narrowing. Upper chest: Negative Other: None IMPRESSION: Degenerative spondylosis and facet arthropathy on the left at C4-5 with left foraminal stenosis that could compress the left C5 nerve. Electronically Signed   By: Nelson Chimes M.D.   On: 05/27/2017 16:22   Nm Myocar Multi W/spect W/wall Motion / Ef  Result Date: 05/27/2017 CLINICAL DATA:  71 year old male with chest pain. EXAM: MYOCARDIAL IMAGING WITH SPECT (REST AND PHARMACOLOGIC-STRESS) GATED LEFT VENTRICULAR WALL MOTION STUDY LEFT VENTRICULAR EJECTION FRACTION TECHNIQUE: Standard myocardial SPECT imaging was performed after resting intravenous injection of 10 mCi Tc-15m tetrofosmin. Subsequently, intravenous infusion of Lexiscan was performed under the supervision of the Cardiology staff. At peak effect of the drug, 30 mCi Tc-30m tetrofosmin was injected intravenously and standard myocardial SPECT imaging was performed. Quantitative gated imaging was also performed to evaluate left ventricular wall motion, and estimate left ventricular ejection fraction. COMPARISON:  CT scan of the chest 05/27/2017 prior nuclear medicine cardiac stress test 08/03/2015 FINDINGS: Perfusion: No decreased activity in the left ventricle on stress imaging to suggest reversible ischemia or infarction. Wall Motion: Normal left ventricular wall motion. No left ventricular dilation. Left Ventricular Ejection Fraction: 58 % End diastolic volume 419 ml End systolic volume 46 ml IMPRESSION: 1. No reversible ischemia or infarction. 2. Normal left ventricular wall motion. 3. Left ventricular ejection fraction 58% 4. Non invasive risk stratification*: Low *2012 Appropriate Use Criteria for Coronary Revascularization Focused Update: J Am Coll Cardiol. 3790;24(0):973-532. http://content.airportbarriers.com.aspx?articleid=[email or web address] Electronically  Signed   By: Jacqulynn Cadet M.D.   On: 05/27/2017 12:06   Disposition   Pt is being discharged home today in good condition.  Follow-up Plans & Appointments    Follow-up Information    Leonie Man, MD Follow up.   Specialty:  Cardiology Why:  Follow up as needed.  Contact information: [address] [place] [address] [phone]        Anda Kraft, MD Follow up.   Specialty:  Endocrinology Why:  Please follow up regarding further work up of left arm numbness.  Contact information: [address]  [place] [address] [phone]          Discharge Instructions    Diet - low sodium heart healthy    Complete by:  As directed    Discharge instructions    Complete by:  As directed    Please follow up with your PCP regarding further work up of your arm numbness. CT here showed some changes in the cervical disc that could be causing your symptoms. You may need an MRI as an outpatient to further address.   Increase activity slowly    Complete by:  As directed       Discharge Medications   Current Discharge Medication List    CONTINUE these medications which have NOT CHANGED   Details  AZOR 10-40 MG per tablet TAKE 1 TABLET BY MOUTH DAILY Qty: 90 tablet, Refills: 2    BRILINTA 60  MG TABS tablet Take 1 tablet (60 mg total) by mouth 2 (two) times daily. Qty: 180 tablet, Refills: 2    COD LIVER OIL PO Take 1 capsule by mouth daily.    dorzolamide-timolol (COSOPT) 22.3-6.8 MG/ML ophthalmic solution Place 1 drop into both eyes 2 (two) times daily.    Dulaglutide (TRULICITY) 1.5 IW/9.7LG SOPN Inject 1.5 mg into the skin every Thursday.    fenofibrate (TRICOR) 145 MG tablet Take 145 mg by mouth daily.    furosemide (LASIX) 40 MG tablet Take 20-80 mg by mouth daily as needed for fluid or edema.     LEVEMIR FLEXTOUCH 100 UNIT/ML Pen Inject 15-35 Units into the skin See admin instructions. Take 15 units in the morning, and 35  units at night    Methylcellulose, Laxative, (CITRUCEL) 500 MG TABS Take 500 mg by mouth daily.     NOVOLOG FLEXPEN 100 UNIT/ML FlexPen Inject 14 Units into the skin 2 (two) times daily after a meal.    travoprost, benzalkonium, (TRAVATAN) 0.004 % ophthalmic solution Place 1 drop into both eyes at bedtime.    VYTORIN 10-40 MG per tablet Take 1 tablet by mouth daily at 6 PM.     allopurinol (ZYLOPRIM) 300 MG tablet Take 300 mg by mouth daily.     diphenhydrAMINE (BENADRYL) 25 mg capsule Take 25 mg by mouth every 6 (six) hours as needed for itching.    nitroGLYCERIN (NITROSTAT) 0.4 MG SL tablet Place 1 tablet (0.4 mg total) under the tongue every 5 (five) minutes as needed for chest pain. Qty: 25 tablet, Refills: 2          Outstanding Labs/Studies   N/A  Duration of Discharge Encounter   Greater than 30 minutes including physician time.  Signed, Reino Bellis NP-C 05/27/2017, 4:33 PM

## 2017-05-27 NOTE — H&P (Signed)
History and Physical   Patient ID: Jacob Berger, MRN: [id number], DOB: February 20, 1946   Date of Encounter: 05/27/2017, 4:42 AM  Primary Care Provider: Anda Kraft, MD Cardiologist: Ellyn Hack Electrophysiologist:  NA  Chief Complaint:  CP  History of Present Illness: Jacob Berger is a 71 y.o. male w/ past medical history of CAD (s/p DES to PDA in 2011, low-risk NST in 07/2015), HTN, HLD, OSA (on CPAP), renal cell cancer (s/p nephrectomy in 04/2016) and thoracic aortic aneurysm (followed by CT Surgery, at 4.5cm in 02/2016) who presents today for evaluation of left sided chest discomfort that has been going on intermittently for the past week or so. Pt says onset of pain is random, not necessarily tied to exertion. He has had 3 major episodes of the discomfort within the past week, most recently today. The discomfort is a pressure in the center to left side of the chest associated with a feeling of "numbness" in the chest as well as L arm. There is associated diaphoresis but no other associated sx. The discomfort will last about 10 min or so prior to easing off over time. He has not tried taking any NTG. He says these sx are very similar to prior angina he had before his PCI to PDA in 2011 in [place].  Past Medical History:  Diagnosis Date  . Arthritis    "knees" (05/10/2016)  . CAD S/P percutaneous coronary angioplasty 2011   a. 2011: s/p PCI to PDA with Endeavor 2.5 mm x 12 mm DES - [place] b. low-risk NST in 07/2015  . Cancer of kidney ([place])   . Complication of anesthesia   . Daily headache    "recently" (05/10/2016)  . Dyslipidemia, goal LDL below 70   . Glaucoma   . Gout   . Hypertension, essential   . Obesity, Class II, BMI 35-39.9, with comorbidity    BMI 37  . OSA on CPAP   . PONV (postoperative nausea and vomiting)    ponv after colonscopy  . Prostate cancer ([place]) 2007  . Rash since 04-08-16   on neck healing, right arm improving  . Thoracic aortic aneurysm without  rupture ([place]) - Noted on chest CT. 4.5 cm 02/15/2016   4.5 cm per dr Cyndia Bent note and [facility] ct 01-18-16  . Type II diabetes mellitus ([place])     Past Surgical History:  Procedure Laterality Date  . COLONOSCOPY W/ BIOPSIES AND POLYPECTOMY  "several"  . CORONARY ANGIOPLASTY WITH STENT PLACEMENT  2001   [place]: Endeavor 2.5 mm x 12 mm DES - distal PDA ([facility])  . LAPAROSCOPIC NEPHRECTOMY Left 04/13/2016   Procedure: LAPAROSCOPIC RADICAL LEFT NEPHRECTOMY;  Surgeon: Raynelle Bring, MD;  Location: [facility];  Service: Urology;  Laterality: Left;  . NM MYOVIEW LTD  June 2013; Sep 2016   a. Treadmill Myoview: 10 minutes, 12 METS; no ischemia infarction, EF 65%; b. [facility]: LOW RISK. EF 57%. 10.4 METS low risk scan no ischemia. No wall motion abnormality.   Marland Kitchen PROSTATECTOMY    . REFRACTIVE SURGERY Left      Prior to Admission medications   Medication Sig Start Date End Date Taking? Authorizing Provider  Albiglutide (TANZEUM) 50 MG PEN Inject 50 mg into the skin every Thursday.     [provider]  allopurinol (ZYLOPRIM) 300 MG tablet Take 300 mg by mouth daily.     [provider]  AZOR 10-40 MG per tablet TAKE 1 TABLET BY MOUTH DAILY  12/18/13   Leonie Man, MD  B-D ULTRAFINE III SHORT PEN 31G X 8 MM MISC  02/23/15   [provider]  BRILINTA 60 MG TABS tablet Take 1 tablet (60 mg total) by mouth 2 (two) times daily. 01/08/17   Leonie Man, MD  COD LIVER OIL PO Take 1 capsule by mouth daily.    [provider]  diphenhydrAMINE (BENADRYL) 25 mg capsule Take 25 mg by mouth every 6 (six) hours as needed for itching.    [provider]  dorzolamide-timolol (COSOPT) 22.3-6.8 MG/ML ophthalmic solution Place 1 drop into both eyes 2 (two) times daily.    [provider]  fenofibrate (TRICOR) 145 MG tablet Take 145 mg by mouth daily.    [provider]  furosemide (LASIX) 40 MG tablet Take 20-40 mg by mouth daily as  needed for fluid or edema.     [provider]  LEVEMIR FLEXTOUCH 100 UNIT/ML Pen Inject 40 Units into the skin at bedtime.  01/04/16   [provider]  Methylcellulose, Laxative, (CITRUCEL PO) Take 1 tablet by mouth daily.    [provider]  nitroGLYCERIN (NITROSTAT) 0.4 MG SL tablet Place 1 tablet (0.4 mg total) under the tongue every 5 (five) minutes as needed for chest pain. 01/11/17 04/11/17  Strader, Fransisco Hertz, PA-C  NOVOLOG FLEXPEN 100 UNIT/ML FlexPen Inject 14 Units into the skin 2 (two) times daily after a meal. 08/10/16   [provider]  travoprost, benzalkonium, (TRAVATAN) 0.004 % ophthalmic solution Place 1 drop into both eyes at bedtime.    [provider]  VYTORIN 10-40 MG per tablet Take 1 tablet by mouth daily at 6 PM.  04/27/15   [provider]     Allergies: Allergies  Allergen Reactions  . Accupril [Quinapril Hcl] Swelling and Other (See Comments)    Mouth swelling    Social History:  The patient  reports that he quit smoking about 23 years ago. His smoking use included Cigarettes. He has never used smokeless tobacco. He reports that he drinks about 3.6 oz of alcohol per week . He reports that he does not use drugs.   Family History:  The patient's family history includes Hypertension in his son.   ROS:  Please see the history of present illness.     All other systems reviewed and negative.   Vital Signs: Blood pressure 130/78, pulse 95, temperature 97.7 F (36.5 C), temperature source Oral, resp. rate (!) 21, height 6' (1.829 m), weight 117.9 kg (260 lb), SpO2 94 %.  PHYSICAL EXAM: General:  Well nourished, well developed, in no acute distress HEENT: normal Lymph: no adenopathy Neck: no JVD Endocrine:  No thryomegaly Vascular: No carotid bruits; DP pulses 2+ bilaterally  Cardiac:  normal S1, S2; RRR; no murmur. Ext warm and well-perfused w/ tr bilateral LE pitting edema Lungs:  clear to auscultation bilaterally,  no wheezing, rhonchi or rales  Abd: soft, nontender, no hepatomegaly  Musculoskeletal:  No deformities, BUE and BLE strength normal and equal Skin: warm and dry. No sig rash or lesion Neuro:  no focal abnormalities noted Psych:  Normal affect   EKG:  05-27-17 NSR with nonspecific ST changes--subtle TWI in the aVF, III a change from prior  Labs:   Lab Results  Component Value Date   WBC 6.9 05/27/2017   HGB 12.8 (L) 05/27/2017   HCT 37.0 (L) 05/27/2017   MCV 86.9 05/27/2017   PLT 179 05/27/2017  Recent Labs Lab 05/27/17 0151  NA 135  K 3.6  CL 105  CO2 20*  BUN 36*  CREATININE 2.22*  CALCIUM 9.5  GLUCOSE 229*    Recent Labs  05/27/17 0151  TROPONINI <0.03   Troponin (Point of Care Test) No results for input(s): TROPIPOC in the last 72 hours.  Lab Results  Component Value Date   CHOL 109 08/06/2014   HDL 32 (L) 08/06/2014   LDLCALC 56 08/06/2014   TRIG 106 08/06/2014   No results found for: DDIMER  Radiology/Studies:  Dg Chest 2 View  Result Date: 05/27/2017 CLINICAL DATA:  Chest pain.  Left arm numbness. EXAM: CHEST  2 VIEW COMPARISON:  Radiograph 05/16/2017.  CT 03/30/2017 FINDINGS: Unchanged heart size and mediastinal contours with tortuosity atherosclerosis of the thoracic aorta. No pulmonary edema, focal airspace disease, pleural effusion or pneumothorax. No acute osseous abnormalities. IMPRESSION: 1. No acute abnormality. 2. Unchanged aortic tortuosity and atherosclerosis. Electronically Signed   By: Jeb Levering M.D.   On: 05/27/2017 02:33   Dg Chest 2 View  Result Date: 05/16/2017 CLINICAL DATA:  Renal cell carcinoma. EXAM: CHEST  2 VIEW COMPARISON:  CT chest 03/30/2017 FINDINGS: The heart size and mediastinal contours are within normal limits. Both lungs are clear. The visualized skeletal structures are unremarkable. IMPRESSION: No active cardiopulmonary disease. Electronically Signed   By: Kathreen Devoid   On: 05/16/2017 10:31   Ct Chest Wo  Contrast  Result Date: 05/27/2017 CLINICAL DATA:  Aortic aneurysm and chest pain. EXAM: CT CHEST WITHOUT CONTRAST TECHNIQUE: Multidetector CT imaging of the chest was performed following the standard protocol without IV contrast. COMPARISON:  Chest CTA 03/30/2017 FINDINGS: Cardiovascular: Examination was performed without IV contrast, limiting intraluminal assessment of the vessels. The ascending aortic aneurysm is unchanged in size measuring approximately 4.6 cm. There is atherosclerotic calcification in the aorta. There is no intramural hematoma. No aneurysm rupture. There are coronary artery calcifications. Heart size is normal. No pericardial effusion. Mediastinum/Nodes: No enlarged mediastinal or axillary lymph nodes. Thyroid gland, trachea, and esophagus demonstrate no significant findings. Lungs/Pleura: Lungs are clear. No pleural effusion or pneumothorax. Upper Abdomen: No acute abnormality. Musculoskeletal: No chest wall mass or suspicious bone lesions identified. IMPRESSION: 1. Unchanged size of ascending thoracic aortic aneurysm without intramural hematoma or evidence of rupture. Recommend semi-annual imaging followup by CTA or MRA and referral to cardiothoracic surgery if not already obtained. This recommendation follows 2010 ACCF/AHA/AATS/ACR/ASA/SCA/SCAI/SIR/STS/SVM Guidelines for the Diagnosis and Management of Patients With Thoracic Aortic Disease. Circulation. 2010; 121: D741-O878 2. Coronary artery and Aortic Atherosclerosis (ICD10-I70.0). Electronically Signed   By: Ulyses Jarred M.D.   On: 05/27/2017 04:12 Jul 2015      a. Treadmill Myoview: 10 minutes, 12 METS; no ischemia infarction,  b. [facility]: LOW RISK. EF 57%. 10.4 METS low risk scan no ischemia. No wall motion abnormality.    Echocardiogram: 05/2016 Study Conclusions  - Left ventricle: The cavity size was normal. There was mild concentric hypertrophy. Systolic function was normal. The estimated ejection fraction was in  the range of 60% to 65%. Wall motion was normal; there were no regional wall motion abnormalities. There was an increased relative contribution of atrial contraction to ventricular filling. Doppler parameters are consistent with abnormal left ventricular relaxation (grade 1 diastolic dysfunction). - Aortic valve: There was mild regurgitation. - Atrial septum: No defect or patent foramen ovale was identified.  February 2017 -NMR: total cholesterol is 97, triglycerides 169, HDL 26, LDL  47; HDL-P 24.6 (-low), HDL-C 24 (goal is> 39); LDL-P8 194 (goal) ( small LDL-P6 197 (high), LDL size 19.8 (borderline), LPL-IR 62 (high)  ASSESSMENT AND PLAN:   1. CAD S/P percutaneous coronary angioplasty/CP: pt w/ h/o PCI to RCA territory now w/ new/worsening non-exertional chest discomfort x 2 wk. Initial trop negative, EKG has some mild TWI in inferior leads that is new compared to prior but may be nonspecific. Will admit to obs to r/o--cycle enzymes. If r/o, consider nuc stress test for further evaluation. Will get updated TTE  2. Left arm numbness: chronic; no recent change in sx. Getting nuc stress as above   3. Dyslipidemia, goal LDL below 70: cont current regimen  4. Hypertension, essential: controlled; cont current regimen  5. OSA on CPAP: cont CPAP as per PCP  6. Abdominal aortic aneurysm (AAA) 3.0 cm to 5.5 cm in diameter in male [facility]): CTA done by ED shows aneurysm is stable from prior    7.      DM2: cont home regimen  Thank you for the opportunity to participate in the care of this very pleasant patient. Will follow. Please call w/ questions.   Rudean Curt, MD , [facility] 05/27/17 4:55 AM

## 2017-05-30 LAB — HEMOGLOBIN A1C
HEMOGLOBIN A1C: 11.1 % — AB (ref 4.8–5.6)
MEAN PLASMA GLUCOSE: 272 mg/dL

## 2017-06-04 DIAGNOSIS — R202 Paresthesia of skin: Secondary | ICD-10-CM | POA: Diagnosis not present

## 2017-06-04 DIAGNOSIS — Z794 Long term (current) use of insulin: Secondary | ICD-10-CM | POA: Diagnosis not present

## 2017-06-04 DIAGNOSIS — M489 Spondylopathy, unspecified: Secondary | ICD-10-CM | POA: Diagnosis not present

## 2017-06-04 DIAGNOSIS — B0229 Other postherpetic nervous system involvement: Secondary | ICD-10-CM | POA: Diagnosis not present

## 2017-06-04 DIAGNOSIS — E1165 Type 2 diabetes mellitus with hyperglycemia: Secondary | ICD-10-CM | POA: Diagnosis not present

## 2017-06-06 ENCOUNTER — Ambulatory Visit (INDEPENDENT_AMBULATORY_CARE_PROVIDER_SITE_OTHER): Payer: Medicare Other | Admitting: Cardiology

## 2017-06-06 VITALS — BP 120/64 | HR 71 | Ht 72.0 in | Wt 269.0 lb

## 2017-06-06 DIAGNOSIS — Z9861 Coronary angioplasty status: Secondary | ICD-10-CM | POA: Diagnosis not present

## 2017-06-06 DIAGNOSIS — I2510 Atherosclerotic heart disease of native coronary artery without angina pectoris: Secondary | ICD-10-CM | POA: Diagnosis not present

## 2017-06-06 DIAGNOSIS — I119 Hypertensive heart disease without heart failure: Secondary | ICD-10-CM

## 2017-06-06 DIAGNOSIS — R079 Chest pain, unspecified: Secondary | ICD-10-CM

## 2017-06-06 NOTE — Progress Notes (Signed)
PCP: Anda Kraft, MD  Clinic Note: Chief Complaint  Patient presents with  . Hospitalization Follow-up    patient reports no new symptoms  . Chest Pain    Non-ischemic Myoview  . Coronary Artery Disease    HPI: Jacob Berger is a 71 y.o. male with a PMH below who presents today for Hospital follow-up. He has history of known CAD status post PCI to the RCA. He also has hypertensive heart disease and Thoracic aortic aneurysm (followed by Dr. Cyndia Bent) along with hypertension and hyperlipidemia and diabetes mellitus -2 & CKD - status post left nephrectomy in June 2017 for renal cell cancer. He has OSA on CPAP.Marland Kitchen  Donaldo Teegarden was last seen on 01/11/2017 by Bernerd Pho, [place].  Recent Hospitalizations: 05/27/2017 for chest pain - low risk for cardiac etiology.  Myoview - non-ischemic. CT C-spine. (d/c summary & H&P reviewed along with studies).  Studies Personally Reviewed - (if available, images/films reviewed: From Epic Chart or Care Everywhere)  Myoview scan 05/27/2017: EF 50%. Normal function. No ischemia or infarction.  CT scan of cervical spine 05/27/2017: Degenerative spondylolysis and facet arthropathy of the left C4-5 disc space with left foraminal stenosis that could compress on the left C5 nerve.  Interval History: Psalm returns today for post hospital follow-up. He says he really hasn't had the same amount of the "tiredness and numbness sensation that he had a left-sided his chest and arm. He does however note that if he leans his head forward he does feel numbness down his left arm and it crosses left shoulder. It is deathly worse with putting his head down or turning it to the right. He doesn't really no sick with any particular exertion unless he is moving his head. The discomfort he had in the hospital was not exertional either. He described his morbid tiredness feeling in his left chest. He still feels little awareness but noted the back Worse with moving his head. He  otherwise has been healthy doing his routine activities - albeit not yet back to his 3-4 days a week exercise of 45 minutes to an hour. He is interested in getting back on the treadmill, but was waiting until his visit to do any other activities.  He really denies any exertional dyspnea which was the main symptom being the had for his original CAD diagnosis. No PND, orthopnea or edema. No palpitations, lightheadedness, dizziness, weakness or syncope/near syncope. No TIA/amaurosis fugax symptoms. No claudication.  ROS: A comprehensive was performed. Review of Systems  Constitutional: Negative for malaise/fatigue.  HENT: Negative for congestion.   Respiratory: Negative for cough, shortness of breath and wheezing.   Gastrointestinal: Negative for blood in stool and melena.  Genitourinary: Negative for hematuria.  Musculoskeletal: Positive for joint pain (Left greater than right knee with swelling).  Skin: Negative.   Neurological: Negative for dizziness.  Psychiatric/Behavioral: Negative.   All other systems reviewed and are negative.  I have reviewed and (if needed) personally updated the patient's problem list, medications, allergies, past medical and surgical history, social and family history.   Past Medical History:  Diagnosis Date  . Arthritis    "knees" (05/10/2016)  . CAD S/P percutaneous coronary angioplasty 2011   a. 2011: s/p PCI to PDA with Endeavor 2.5 mm x 12 mm DES - [place] b. low-risk NST in 07/2015  . Cancer of kidney ([place])   . Complication of anesthesia   . Daily headache    "recently" (05/10/2016)  . Dyslipidemia, goal  LDL below 70   . Glaucoma   . Gout   . Hypertension, essential   . Obesity, Class II, BMI 35-39.9, with comorbidity    BMI 37  . OSA on CPAP   . PONV (postoperative nausea and vomiting)    ponv after colonscopy  . Prostate cancer ([place]) 2007  . Rash since 04-08-16   on neck healing, right arm improving  . Thoracic aortic  aneurysm without rupture ([place]) - Noted on chest CT. 4.5 cm 02/15/2016   4.5 cm per dr Cyndia Bent note and [facility] ct 01-18-16  . Type II diabetes mellitus ([place])     Past Surgical History:  Procedure Laterality Date  . COLONOSCOPY W/ BIOPSIES AND POLYPECTOMY  "several"  . CORONARY ANGIOPLASTY WITH STENT PLACEMENT  2001   [place]: Endeavor 2.5 mm x 12 mm DES - distal PDA ([facility])  . LAPAROSCOPIC NEPHRECTOMY Left 04/13/2016   Procedure: LAPAROSCOPIC RADICAL LEFT NEPHRECTOMY;  Surgeon: Raynelle Bring, MD;  Location: [facility];  Service: Urology;  Laterality: Left;  . NM MYOVIEW LTD  June 2013; Sep 2016   a. Treadmill Myoview: 10 minutes, 12 METS; no ischemia infarction, EF 65%; b. [facility]: LOW RISK. EF 57%. 10.4 METS low risk scan no ischemia. No wall motion abnormality.   Marland Kitchen PROSTATECTOMY    . REFRACTIVE SURGERY Left     No outpatient prescriptions have been marked as taking for the 06/06/17 encounter (Office Visit) with Leonie Man, MD.    Allergies  Allergen Reactions  . Accupril [Quinapril Hcl] Swelling and Other (See Comments)    Mouth swelling    Social History   Social History  . Marital status: Married    Spouse name: N/A  . Number of children: N/A  . Years of education: N/A   Social History Main Topics  . Smoking status: Former Smoker    Types: Cigarettes    Quit date: 06/04/1993  . Smokeless tobacco: Never Used     Comment: A PK WOULD LAST A WEEK  . Alcohol use 3.6 oz/week    2 Glasses of wine, 4 Cans of beer per week  . Drug use: No  . Sexual activity: Not Currently   Other Topics Concern  . None   Social History Narrative   Married father of 3. Had been walking up to 2 miles every other day appetite is good his weight for about half an hour time period, but limited now due to back pain. Occasional alcohol. No tobacco products -- he quit about 20-30 years ago.    family history includes Hypertension in his son.  Wt Readings from Last 3  Encounters:  06/06/17 269 lb (122 kg)  05/27/17 260 lb (117.9 kg)  04/04/17 260 lb (117.9 kg)  01/2017 - 273  PHYSICAL EXAM BP 120/64   Pulse 71   Ht 6' (1.829 m)   Wt 269 lb (122 kg)   BMI 36.48 kg/m  Physical Exam  Constitutional: He is oriented to person, place, and time. He appears well-developed and well-nourished. No distress.  HENT:  Head: Normocephalic and atraumatic.  Neck: Normal range of motion. Neck supple. No JVD present. No thyromegaly present.  He does have soreness and tenderness along the left neck/C-spine. Bending his head forward lead to a radiculopathic numbness in the left shoulder and arm.  Cardiovascular: Normal rate, regular rhythm and intact distal pulses.  Exam reveals no gallop and no friction rub.   Murmur heard.  Harsh crescendo-decrescendo  early systolic murmur is present with a grade of 1/6  at the upper right sternal border Pulses:      Radial pulses are 1+ on the right side.  Pulmonary/Chest: Effort normal and breath sounds normal. No respiratory distress. He has no wheezes. He has no rales. He exhibits no tenderness.  Abdominal: Soft. Bowel sounds are normal. He exhibits no distension. There is no tenderness. There is no rebound.  Musculoskeletal: Normal range of motion. He exhibits no edema or deformity.       Right shoulder: He exhibits no tenderness.  Neurological: He is alert and oriented to person, place, and time.  Psychiatric: He has a normal mood and affect. His behavior is normal. Judgment and thought content normal.  Nursing note and vitals reviewed.    Adult ECG Report n/a  Other studies Reviewed: Additional studies/ records that were reviewed today include:  Recent Labs:  PCP has been checking Lipids.   ASSESSMENT / PLAN: Problem List Items Addressed This Visit    Chest pain with low risk for cardiac etiology - Primary    Recently admitted for chest discomfort concerning for angina, however his Myoview is nonischemic. This would  argue against a cardiac etiology unless associated with significant hypertension. However in this situation he did not have significant hypertension. Therefore likely more musculoskeletal/C5 compression type of symptom based on the CT scan of the neck result. He is due to follow up with neurosurgery. I recommend that he does not do upper arm exercises until he is seen by neurosurgery to avoid exacerbating this symptom.      Coronary artery disease involving native coronary artery of native heart without angina pectoris (Chronic)    Status post DES stent to the PDA while in [place]. The presenting symptom that he had when he went then was more dyspnea and Fatigue sensation. His symptoms in the hospital during his recent hospitalization were somewhat similar to his anginal equivalent symptoms in [place], but he did not have the dyspnea. What he describes seems more consistent with cervical nerve compression leading to a numb sensation in his shoulder and left arm. This was exacerbated by moving his head. Caryl Comes stress test was read as no ischemia. There does appear to be a fixed inferior defect however. The [facility] component was that there was no real wall motion abnormality noted - this would exclude prior infarct which is assisting based on the appearance. Regardless, the reading physician noted is being low risk, and he is not truly having anginal pain.   Continue Brilinta, ARB/CCB and beta blocker along with Vytorin.      Hypertensive heart disease without CHF (Chronic)    Well-controlled blood pressure today on current medications.         Current medicines are reviewed at length with the patient today. (+/- concerns) n/a The following changes have been made: n/a  Patient Instructions  NO CHANGE WITH CURRENT MEDICATIONS   IT OKAY TO EXERCISE- BY WALKING AND ON TREADMILL   Your physician wants you to follow-up in [place] DR Elaura [place]. You will receive a reminder letter in the  mail two months in advance. If you don't receive a letter, please call our office to schedule the follow-up appointment.   If you need a refill on your cardiac medications before your next appointment, please call your pharmacy.    Studies Ordered:   No orders of the defined types were placed in this encounter.     Shanon Brow  Ellyn Hack, M.D., M.S. Interventional Cardiologist   Pager # [phone] Phone # [phone] [address] [place], [place] [address]

## 2017-06-06 NOTE — Patient Instructions (Signed)
NO CHANGE WITH CURRENT MEDICATIONS   IT OKAY TO EXERCISE- BY WALKING AND ON TREADMILL   Your physician wants you to follow-up in [place] DR [place]ARD[place]. You will receive a reminder letter in the mail two months in advance. If you don't receive a letter, please call our office to schedule the follow-up appointment.   If you need a refill on your cardiac medications before your next appointment, please call your pharmacy.

## 2017-06-07 ENCOUNTER — Encounter: Payer: Self-pay | Admitting: Cardiology

## 2017-06-07 NOTE — Assessment & Plan Note (Signed)
Recently admitted for chest discomfort concerning for angina, however his Myoview is nonischemic. This would argue against a cardiac etiology unless associated with significant hypertension. However in this situation he did not have significant hypertension. Therefore likely more musculoskeletal/C5 compression type of symptom based on the CT scan of the neck result. He is due to follow up with neurosurgery. I recommend that he does not do upper arm exercises until he is seen by neurosurgery to avoid exacerbating this symptom.

## 2017-06-07 NOTE — Assessment & Plan Note (Signed)
Well-controlled blood pressure today on current medications.

## 2017-06-07 NOTE — Assessment & Plan Note (Signed)
Status post DES stent to the PDA while in [place]. The presenting symptom that he had when he went then was more dyspnea and Fatigue sensation. His symptoms in the hospital during his recent hospitalization were somewhat similar to his anginal equivalent symptoms in [place], but he did not have the dyspnea. What he describes seems more consistent with cervical nerve compression leading to a numb sensation in his shoulder and left arm. This was exacerbated by moving his head. Caryl Comes stress test was read as no ischemia. There does appear to be a fixed inferior defect however. The [facility] component was that there was no real wall motion abnormality noted - this would exclude prior infarct which is assisting based on the appearance. Regardless, the reading physician noted is being low risk, and he is not truly having anginal pain.   Continue Brilinta, ARB/CCB and beta blocker along with Vytorin.

## 2017-06-13 ENCOUNTER — Ambulatory Visit: Payer: Medicare Other | Admitting: Neurology

## 2017-06-14 DIAGNOSIS — M4722 Other spondylosis with radiculopathy, cervical region: Secondary | ICD-10-CM | POA: Diagnosis not present

## 2017-06-28 DIAGNOSIS — M5412 Radiculopathy, cervical region: Secondary | ICD-10-CM | POA: Diagnosis not present

## 2017-06-28 DIAGNOSIS — M542 Cervicalgia: Secondary | ICD-10-CM | POA: Diagnosis not present

## 2017-06-28 DIAGNOSIS — R293 Abnormal posture: Secondary | ICD-10-CM | POA: Diagnosis not present

## 2017-06-28 DIAGNOSIS — M2560 Stiffness of unspecified joint, not elsewhere classified: Secondary | ICD-10-CM | POA: Diagnosis not present

## 2017-06-29 DIAGNOSIS — R293 Abnormal posture: Secondary | ICD-10-CM | POA: Diagnosis not present

## 2017-06-29 DIAGNOSIS — M2560 Stiffness of unspecified joint, not elsewhere classified: Secondary | ICD-10-CM | POA: Diagnosis not present

## 2017-06-29 DIAGNOSIS — M5412 Radiculopathy, cervical region: Secondary | ICD-10-CM | POA: Diagnosis not present

## 2017-06-29 DIAGNOSIS — M542 Cervicalgia: Secondary | ICD-10-CM | POA: Diagnosis not present

## 2017-07-02 DIAGNOSIS — M542 Cervicalgia: Secondary | ICD-10-CM | POA: Diagnosis not present

## 2017-07-02 DIAGNOSIS — M2560 Stiffness of unspecified joint, not elsewhere classified: Secondary | ICD-10-CM | POA: Diagnosis not present

## 2017-07-02 DIAGNOSIS — M5412 Radiculopathy, cervical region: Secondary | ICD-10-CM | POA: Diagnosis not present

## 2017-07-02 DIAGNOSIS — R293 Abnormal posture: Secondary | ICD-10-CM | POA: Diagnosis not present

## 2017-07-04 DIAGNOSIS — M5412 Radiculopathy, cervical region: Secondary | ICD-10-CM | POA: Diagnosis not present

## 2017-07-04 DIAGNOSIS — M542 Cervicalgia: Secondary | ICD-10-CM | POA: Diagnosis not present

## 2017-07-04 DIAGNOSIS — M2560 Stiffness of unspecified joint, not elsewhere classified: Secondary | ICD-10-CM | POA: Diagnosis not present

## 2017-07-04 DIAGNOSIS — R293 Abnormal posture: Secondary | ICD-10-CM | POA: Diagnosis not present

## 2017-07-10 ENCOUNTER — Other Ambulatory Visit: Payer: Self-pay | Admitting: Neurological Surgery

## 2017-07-10 DIAGNOSIS — M4722 Other spondylosis with radiculopathy, cervical region: Secondary | ICD-10-CM

## 2017-07-11 DIAGNOSIS — M2560 Stiffness of unspecified joint, not elsewhere classified: Secondary | ICD-10-CM | POA: Diagnosis not present

## 2017-07-11 DIAGNOSIS — H401123 Primary open-angle glaucoma, left eye, severe stage: Secondary | ICD-10-CM | POA: Diagnosis not present

## 2017-07-11 DIAGNOSIS — R293 Abnormal posture: Secondary | ICD-10-CM | POA: Diagnosis not present

## 2017-07-11 DIAGNOSIS — M542 Cervicalgia: Secondary | ICD-10-CM | POA: Diagnosis not present

## 2017-07-11 DIAGNOSIS — M5412 Radiculopathy, cervical region: Secondary | ICD-10-CM | POA: Diagnosis not present

## 2017-07-13 DIAGNOSIS — M2560 Stiffness of unspecified joint, not elsewhere classified: Secondary | ICD-10-CM | POA: Diagnosis not present

## 2017-07-13 DIAGNOSIS — R293 Abnormal posture: Secondary | ICD-10-CM | POA: Diagnosis not present

## 2017-07-13 DIAGNOSIS — M542 Cervicalgia: Secondary | ICD-10-CM | POA: Diagnosis not present

## 2017-07-13 DIAGNOSIS — M5412 Radiculopathy, cervical region: Secondary | ICD-10-CM | POA: Diagnosis not present

## 2017-07-18 DIAGNOSIS — E118 Type 2 diabetes mellitus with unspecified complications: Secondary | ICD-10-CM | POA: Diagnosis not present

## 2017-07-18 DIAGNOSIS — I10 Essential (primary) hypertension: Secondary | ICD-10-CM | POA: Diagnosis not present

## 2017-07-21 ENCOUNTER — Other Ambulatory Visit: Payer: Medicare Other

## 2017-07-26 DIAGNOSIS — N183 Chronic kidney disease, stage 3 (moderate): Secondary | ICD-10-CM | POA: Diagnosis not present

## 2017-07-26 DIAGNOSIS — E782 Mixed hyperlipidemia: Secondary | ICD-10-CM | POA: Diagnosis not present

## 2017-07-26 DIAGNOSIS — E1165 Type 2 diabetes mellitus with hyperglycemia: Secondary | ICD-10-CM | POA: Diagnosis not present

## 2017-07-26 DIAGNOSIS — I129 Hypertensive chronic kidney disease with stage 1 through stage 4 chronic kidney disease, or unspecified chronic kidney disease: Secondary | ICD-10-CM | POA: Diagnosis not present

## 2017-07-26 DIAGNOSIS — G4733 Obstructive sleep apnea (adult) (pediatric): Secondary | ICD-10-CM | POA: Diagnosis not present

## 2017-07-26 DIAGNOSIS — E1121 Type 2 diabetes mellitus with diabetic nephropathy: Secondary | ICD-10-CM | POA: Diagnosis not present

## 2017-07-26 DIAGNOSIS — I2510 Atherosclerotic heart disease of native coronary artery without angina pectoris: Secondary | ICD-10-CM | POA: Diagnosis not present

## 2017-07-26 DIAGNOSIS — Z23 Encounter for immunization: Secondary | ICD-10-CM | POA: Diagnosis not present

## 2017-07-26 DIAGNOSIS — K649 Unspecified hemorrhoids: Secondary | ICD-10-CM | POA: Diagnosis not present

## 2017-07-28 ENCOUNTER — Ambulatory Visit
Admission: RE | Admit: 2017-07-28 | Discharge: 2017-07-28 | Disposition: A | Discharge status: Home / Self Care | Payer: Medicare Other | Source: Ambulatory Visit | Attending: Neurological Surgery | Admitting: Neurological Surgery

## 2017-07-28 DIAGNOSIS — M4722 Other spondylosis with radiculopathy, cervical region: Secondary | ICD-10-CM

## 2017-07-28 DIAGNOSIS — M4802 Spinal stenosis, cervical region: Secondary | ICD-10-CM | POA: Diagnosis not present

## 2017-07-30 DIAGNOSIS — I10 Essential (primary) hypertension: Secondary | ICD-10-CM | POA: Diagnosis not present

## 2017-07-30 DIAGNOSIS — E118 Type 2 diabetes mellitus with unspecified complications: Secondary | ICD-10-CM | POA: Diagnosis not present

## 2017-08-06 DIAGNOSIS — I10 Essential (primary) hypertension: Secondary | ICD-10-CM | POA: Diagnosis not present

## 2017-08-06 DIAGNOSIS — E782 Mixed hyperlipidemia: Secondary | ICD-10-CM | POA: Diagnosis not present

## 2017-08-06 DIAGNOSIS — E118 Type 2 diabetes mellitus with unspecified complications: Secondary | ICD-10-CM | POA: Diagnosis not present

## 2017-08-13 DIAGNOSIS — E118 Type 2 diabetes mellitus with unspecified complications: Secondary | ICD-10-CM | POA: Diagnosis not present

## 2017-08-14 DIAGNOSIS — L258 Unspecified contact dermatitis due to other agents: Secondary | ICD-10-CM | POA: Diagnosis not present

## 2017-08-21 DIAGNOSIS — I10 Essential (primary) hypertension: Secondary | ICD-10-CM | POA: Diagnosis not present

## 2017-08-21 DIAGNOSIS — E118 Type 2 diabetes mellitus with unspecified complications: Secondary | ICD-10-CM | POA: Diagnosis not present

## 2017-08-27 ENCOUNTER — Other Ambulatory Visit: Payer: Self-pay | Admitting: Cardiology

## 2017-08-27 NOTE — Telephone Encounter (Signed)
REFILL 

## 2017-08-30 DIAGNOSIS — H401133 Primary open-angle glaucoma, bilateral, severe stage: Secondary | ICD-10-CM | POA: Diagnosis not present

## 2017-08-31 ENCOUNTER — Encounter: Payer: Self-pay | Admitting: Registered"

## 2017-08-31 ENCOUNTER — Encounter: Payer: Medicare Other | Attending: Internal Medicine | Admitting: Registered"

## 2017-08-31 DIAGNOSIS — Z794 Long term (current) use of insulin: Secondary | ICD-10-CM

## 2017-08-31 DIAGNOSIS — E119 Type 2 diabetes mellitus without complications: Secondary | ICD-10-CM | POA: Diagnosis not present

## 2017-08-31 DIAGNOSIS — Z713 Dietary counseling and surveillance: Secondary | ICD-10-CM | POA: Insufficient documentation

## 2017-08-31 NOTE — Progress Notes (Signed)
Diabetes Self-Management Education  Visit Type: First/Initial  Appt. Start Time: 0930 Appt. End Time: 1100  08/31/2017  Mr. Jacob Berger, identified by name and date of birth, is a 71 y.o. male with a diagnosis of Diabetes: Type 2.   ASSESSMENT Pt states he would like to get his diabetes under control. Pt states he does not like pricking his finger and would like to be able to have the [facility] and would be more likely to check his BG as directed by his doctor. Pt report that currently he just checks fasting number and  takes 15 units of Novolog before each meal. Pt states he was instructed to increase insulin if his BG is high before a meal.  Pt states [facility] but wasn't sure how to go about getting the membership he was told he qualifies for.   Pt states he has occasional constipation and takes citrucel nightly for prevention.  Pt states he has sleep apnea and the CPAP helps but still only gets about 4-6 hrs a night.     Diabetes Self-Management Education - 08/31/17 0943      Visit Information   Visit Type First/Initial     Initial Visit   Diabetes Type Type 2   Are you currently following a meal plan? No   Are you taking your medications as prescribed? Yes   Date Diagnosed at least 10 years     Health Coping   How would you rate your overall health? Poor     Psychosocial Assessment   Patient Belief/Attitude about Diabetes Motivated to manage diabetes   How often do you need to have someone help you when you read instructions, pamphlets, or other written materials from your doctor or pharmacy? 1 - Never   What is the last grade level you completed in school? 16     Complications   Last HgB A1C per patient/outside source 9.2 %  per oustide source 07/18/17   How often do you check your blood sugar? 1-2 times/day   Fasting Blood glucose range (mg/dL) 130-179  130s   Number of hypoglycemic episodes per month 0   Have you had  a dilated eye exam in the past 12 months? Yes   Have you had a dental exam in the past 12 months? No   Are you checking your feet? Yes   How many days per week are you checking your feet? 2     Dietary Intake   Breakfast cold cereal OR hard boild egg & meat OR fried fish, eggs, grits, toast   Snack (morning) cheese sticks OR almonds   Lunch TV dinner OR 1/2 sub, diet soda, chips   Snack (afternoon) apple   Dinner [place] legs, rice, cabbage or green vegetable   Snack (evening) prune OR almond   Beverage(s) water, diet soda, coffee with pink pack (loves hazelnut, but stopped)     Exercise   Exercise Type Light (walking / raking leaves)   How many days per week to you exercise? 4   How many minutes per day do you exercise? 60   Total minutes per week of exercise 240     Patient Education   Previous Diabetes Education No   Disease state  Definition of diabetes, type 1 and 2, and the diagnosis of diabetes   Nutrition management  Role of diet in the treatment of diabetes and the relationship between the three main macronutrients  and blood glucose level;Food label reading, portion sizes and measuring food.;Carbohydrate counting   Physical activity and exercise  Role of exercise on diabetes management, blood pressure control and cardiac health.   Monitoring Identified appropriate SMBG and/or A1C goals.   Acute complications Taught treatment of hypoglycemia - the 15 rule.   Chronic complications Relationship between chronic complications and blood glucose control   Psychosocial adjustment Role of stress on diabetes     Individualized Goals (developed by patient)   Nutrition Follow meal plan discussed   Monitoring  test my blood glucose as discussed     Outcomes   Expected Outcomes Demonstrated interest in learning. Expect positive outcomes   Future DMSE PRN   Program Status Completed    Individualized Plan for Diabetes Self-Management Training:   Learning Objective:  Patient will have  a greater understanding of diabetes self-management. Patient education plan is to attend individual and/or group sessions per assessed needs and concerns.  Patient Instructions  Plan:   Aim for 4-5 Carb Choices per meal (60-70 grams)  Aim for 0-2 Carbs per snack if hungry   Include protein with your meals and snacks  Consider reading food labels for Total Carbohydrate  Continue your activity level  as tolerated  Consider checking BG at alternate times per day as directed by MD, fasting and before meals for insulin dosage. Also I recommend checking a few times per week 2 hours after meals to learn what foods and meal combinations are raising your blood sugar so you can adjust your diet.  Check your blood sugar if you have symptoms of low blood sugar (hypoglycemia) Consider getting glucose tablets to treat low blood sugar  Consider taking medication as directed by MD  Talk to insurance company about [employer] [place] glucose monitor and also ask if you can get [employer] gym membership.  Expected Outcomes:  Demonstrated interest in learning. Expect positive outcomes  Education material provided: Living Well with Diabetes, A1C conversion sheet, My Plate and Carbohydrate counting sheet, Carb Counting and Meal Planning (novo nordisk book), sleep hygiene  If problems or questions, patient to contact team via:  Phone  Future DSME appointment: PRN

## 2017-08-31 NOTE — Patient Instructions (Addendum)
Plan:   Aim for 4-5 Carb Choices per meal (60-70 grams)  Aim for 0-2 Carbs per snack if hungry   Include protein with your meals and snacks  Consider reading food labels for Total Carbohydrate  Continue your activity level  as tolerated  Consider checking BG at alternate times per day as directed by MD, fasting and before meals for insulin dosage. Also I recommend checking a few times per week 2 hours after meals to learn what foods and meal combinations are raising your blood sugar so you can adjust your diet.  Check your blood sugar if you have symptoms of low blood sugar (hypoglycemia) Consider getting glucose tablets to treat low blood sugar  Consider taking medication as directed by MD  Talk to insurance company about [employer] [place] glucose monitor and also ask if you can get [employer] gym membership.

## 2017-09-06 DIAGNOSIS — M4712 Other spondylosis with myelopathy, cervical region: Secondary | ICD-10-CM | POA: Diagnosis not present

## 2017-09-18 ENCOUNTER — Other Ambulatory Visit: Payer: Self-pay | Admitting: Neurological Surgery

## 2017-09-19 ENCOUNTER — Telehealth: Payer: Self-pay | Admitting: Cardiology

## 2017-09-19 NOTE — Telephone Encounter (Signed)
   [facility] Pre-operative Risk Assessment    Request for surgical clearance:  1. What type of surgery is being performed? C4-5, C5-6 anterior discectomy with fusion and plate fixation   2. When is this surgery scheduled? 11/09/2017   3. Are there any medications that need to be held prior to surgery and how long? Brilinta  4. Practice name and name of physician performing surgery? Dr. Marland Kitchen Ditty @ [facility]   5. What is your office phone and fax number? (p) [phone]  (f) [phone]   6. Anesthesia type (None, local, MAC, general) ? Not specified  ** also requests last office visit note be faxed   Jacob Berger 09/19/2017, 9:57 AM  _________________________________________________________________   (provider comments below)

## 2017-09-19 NOTE — Telephone Encounter (Signed)
    Chart reviewed as part of pre-operative protocol coverage.  Last seen by Dr. Ellyn Hack 06/06/17. Left voice mail to call back.   Will route to Dr. Ellyn Hack to review Brillinta.   Anvi Mangal, PA  09/19/2017, 3:00 PM

## 2017-09-20 MED ORDER — SODIUM CHLORIDE 0.9 % IV SOLN: INTRAVENOUS | Status: AC | PRN

## 2017-09-21 NOTE — Telephone Encounter (Addendum)
    Chart reviewed as part of pre-operative protocol coverage. Patient was contacted 09/21/2017 in reference to pre-operative risk assessment for pending surgery as outlined below.  Jacob Berger was contacted and messages were left on his home phone and cell phone.  Dr. Ellyn Hack was contacted and stated it is okay to hold his Brilinta for the surgery.   Jacob Berger was last seen on 06/07/2017 by Dr. Ellyn Hack.     Rosaria Ferries, PA-C 09/21/2017, 2:37 PM

## 2017-09-24 NOTE — Telephone Encounter (Signed)
Again left voice mail to call back. Covering staff, Please mail letter to call back [place] between 1:30-5:00pm and ask for Pre-op covering staff.

## 2017-09-25 NOTE — Telephone Encounter (Addendum)
   Chart reviewed as part of pre-operative protocol coverage. Given past medical history and time since last visit, based on ACC/AHA guidelines, Jacob Berger would be at acceptable risk for the planned procedure without further cardiovascular testing.    I spoke with patient over phone. He denies any cardiac symptoms. No exertional CP or dyspnea. He can be cleared for surgery. Dr. Ellyn Hack also said Jacob Berger can be held. Hold 5 days prior to surgery.   I will route this recommendation to the requesting party via Epic fax function and remove from pre-op pool.  Please call with questions.  Lyda Jester, PA-C 09/25/2017, 4:11 PM

## 2017-10-01 DIAGNOSIS — H401123 Primary open-angle glaucoma, left eye, severe stage: Secondary | ICD-10-CM | POA: Diagnosis not present

## 2017-10-19 DIAGNOSIS — E1165 Type 2 diabetes mellitus with hyperglycemia: Secondary | ICD-10-CM | POA: Diagnosis not present

## 2017-10-19 DIAGNOSIS — N183 Chronic kidney disease, stage 3 (moderate): Secondary | ICD-10-CM | POA: Diagnosis not present

## 2017-10-19 DIAGNOSIS — E782 Mixed hyperlipidemia: Secondary | ICD-10-CM | POA: Diagnosis not present

## 2017-10-19 DIAGNOSIS — I2510 Atherosclerotic heart disease of native coronary artery without angina pectoris: Secondary | ICD-10-CM | POA: Diagnosis not present

## 2017-10-23 DIAGNOSIS — I2510 Atherosclerotic heart disease of native coronary artery without angina pectoris: Secondary | ICD-10-CM | POA: Diagnosis not present

## 2017-10-23 DIAGNOSIS — E782 Mixed hyperlipidemia: Secondary | ICD-10-CM | POA: Diagnosis not present

## 2017-10-23 DIAGNOSIS — E1121 Type 2 diabetes mellitus with diabetic nephropathy: Secondary | ICD-10-CM | POA: Diagnosis not present

## 2017-10-23 DIAGNOSIS — G4733 Obstructive sleep apnea (adult) (pediatric): Secondary | ICD-10-CM | POA: Diagnosis not present

## 2017-10-23 DIAGNOSIS — E1165 Type 2 diabetes mellitus with hyperglycemia: Secondary | ICD-10-CM | POA: Diagnosis not present

## 2017-10-23 DIAGNOSIS — M25562 Pain in left knee: Secondary | ICD-10-CM | POA: Diagnosis not present

## 2017-10-23 DIAGNOSIS — I129 Hypertensive chronic kidney disease with stage 1 through stage 4 chronic kidney disease, or unspecified chronic kidney disease: Secondary | ICD-10-CM | POA: Diagnosis not present

## 2017-10-23 DIAGNOSIS — I10 Essential (primary) hypertension: Secondary | ICD-10-CM | POA: Diagnosis not present

## 2017-10-23 DIAGNOSIS — N183 Chronic kidney disease, stage 3 (moderate): Secondary | ICD-10-CM | POA: Diagnosis not present

## 2017-10-24 DIAGNOSIS — H401112 Primary open-angle glaucoma, right eye, moderate stage: Secondary | ICD-10-CM | POA: Diagnosis not present

## 2017-11-05 NOTE — Pre-Procedure Instructions (Signed)
Brendt Dible Funk  11/05/2017      [id number] [place], [place] - [address] [facility] [place] [address] [place] [address] Phone: [phone] Fax: [phone]    Your procedure is scheduled on Jan 4   Report to [place] at 530 A.M.  Call this number if you have problems the morning of surgery:  [phone]   Remember:  Do not eat food or drink liquids after midnight.  Take these medicines the morning of surgery with A SIP OF WATER Allopurinol (Zyloprim), Nitrostat if needed,  gabapentin (Neurontin), Timoptic eye drops  Stop Brilinta as directed by your Dr.  Stop taking aspirin, BC's, Goody's, Herbal medications, Fish Oil, Vitamins, Ibuprofen, Advil, motrin, Aleve    How to Manage Your Diabetes Before and After Surgery  Why is it important to control my blood sugar before and after surgery? . Improving blood sugar levels before and after surgery helps healing and can limit problems. . A way of improving blood sugar control is eating a healthy diet by: o  Eating less sugar and carbohydrates o  Increasing activity/exercise o  Talking with your doctor about reaching your blood sugar goals . High blood sugars (greater than 180 mg/dL) can raise your risk of infections and slow your recovery, so you will need to focus on controlling your diabetes during the weeks before surgery. . Make sure that the doctor who takes care of your diabetes knows about your planned surgery including the date and location.  How do I manage my blood sugar before surgery? . Check your blood sugar at least 4 times a day, starting 2 days before surgery, to make sure that the level is not too high or low. o Check your blood sugar the morning of your surgery when you wake up and every 2 hours until you get to the Short Stay unit. . If your blood sugar is less than 70 mg/dL, you will need to treat for low blood sugar: o Do not take  insulin. o Treat a low blood sugar (less than 70 mg/dL) with  cup of clear juice (cranberry or apple), 4 glucose tablets, OR glucose gel. Recheck blood sugar in 15 minutes after treatment (to make sure it is greater than 70 mg/dL). If your blood sugar is not greater than 70 mg/dL on recheck, call [phone] o  for further instructions. . Report your blood sugar to the short stay nurse when you get to Short Stay.  . If you are admitted to the hospital after surgery: o Your blood sugar will be checked by the staff and you will probably be given insulin after surgery (instead of oral diabetes medicines) to make sure you have good blood sugar levels. o The goal for blood sugar control after surgery is 80-180 mg/dL.              WHAT DO I DO ABOUT MY DIABETES MEDICATION?   Marland Kitchen Do not take oral diabetes medicines (pills) the morning of surgery.  . THE NIGHT BEFORE SURGERY, take 20 units of Levemir insulin.       . THE MORNING OF SURGERY, take _____________ units of __________insulin.  . The day of surgery, do not take other diabetes injectables, including Byetta (exenatide), Bydureon (exenatide ER), Victoza (liraglutide), or Trulicity (dulaglutide).  . If your CBG is greater than 220 mg/dL, you may take  of your sliding scale (correction) dose of insulin.  Other Instructions:  Patient Signature:  Date:   Nurse Signature:  Date:   Reviewed and Endorsed by [facility] Patient Education Committee, August 2015 Do not wear jewelry, make-up or nail polish.  Do not wear lotions, powders, or perfumes, or deodorant.  Do not shave 48 hours prior to surgery.  Men may shave face and neck.  Do not bring valuables to the hospital.  [facility] is not responsible for any belongings or valuables.  Contacts, dentures or bridgework may not be worn into surgery.  Leave your suitcase in the car.  After surgery it may be brought to your room.  For patients admitted to the  hospital, discharge time will be determined by your treatment team.  Patients discharged the day of surgery will not be allowed to drive home.    Special instructions:  [place] - Preparing for Surgery  Before surgery, you can play an important role.  Because skin is not sterile, your skin needs to be as free of germs as possible.  You can reduce the number of germs on you skin by washing with CHG (chlorahexidine gluconate) soap before surgery.  CHG is an antiseptic cleaner which kills germs and bonds with the skin to continue killing germs even after washing.  Please DO NOT use if you have an allergy to CHG or antibacterial soaps.  If your skin becomes reddened/irritated stop using the CHG and inform your nurse when you arrive at Short Stay.  Do not shave (including legs and underarms) for at least 48 hours prior to the first CHG shower.  You may shave your face.  Please follow these instructions carefully:   1.  Shower with CHG Soap the night before surgery and the                                morning of Surgery.  2.  If you choose to wash your hair, wash your hair first as usual with your       normal shampoo.  3.  After you shampoo, rinse your hair and body thoroughly to remove the                      Shampoo.  4.  Use CHG as you would any other liquid soap.  You can apply chg directly       to the skin and wash gently with scrungie or a clean washcloth.  5.  Apply the CHG Soap to your body ONLY FROM THE NECK DOWN.        Do not use on open wounds or open sores.  Avoid contact with your eyes,       ears, mouth and genitals (private parts).  Wash genitals (private parts)       with your normal soap.  6.  Wash thoroughly, paying special attention to the area where your surgery        will be performed.  7.  Thoroughly rinse your body with warm water from the neck down.  8.  DO NOT shower/wash with your normal soap after using and rinsing off       the CHG Soap.  9.  Pat yourself dry  with a clean towel.            10.  Wear clean pajamas.            11.  Place clean sheets on your bed the  night of your first shower and do not        sleep with pets.  Day of Surgery  Do not apply any lotions/deoderants the morning of surgery.  Please wear clean clothes to the hospital/surgery center.     Please read over the following fact sheets that you were given. Pain Booklet, Coughing and Deep Breathing, MRSA Information and Surgical Site Infection Prevention

## 2017-11-07 ENCOUNTER — Encounter (HOSPITAL_COMMUNITY): Payer: Self-pay

## 2017-11-07 ENCOUNTER — Other Ambulatory Visit: Payer: Self-pay

## 2017-11-07 ENCOUNTER — Encounter (HOSPITAL_COMMUNITY)
Admission: RE | Admit: 2017-11-07 | Discharge: 2017-11-07 | Disposition: A | Discharge status: Home / Self Care | Payer: Medicare Other | Source: Ambulatory Visit | Attending: Neurological Surgery | Admitting: Neurological Surgery

## 2017-11-07 DIAGNOSIS — M4802 Spinal stenosis, cervical region: Secondary | ICD-10-CM | POA: Diagnosis not present

## 2017-11-07 DIAGNOSIS — E785 Hyperlipidemia, unspecified: Secondary | ICD-10-CM | POA: Diagnosis not present

## 2017-11-07 DIAGNOSIS — Z79899 Other long term (current) drug therapy: Secondary | ICD-10-CM | POA: Diagnosis not present

## 2017-11-07 DIAGNOSIS — M4712 Other spondylosis with myelopathy, cervical region: Secondary | ICD-10-CM | POA: Diagnosis not present

## 2017-11-07 DIAGNOSIS — Z9989 Dependence on other enabling machines and devices: Secondary | ICD-10-CM | POA: Diagnosis not present

## 2017-11-07 DIAGNOSIS — I10 Essential (primary) hypertension: Secondary | ICD-10-CM | POA: Diagnosis not present

## 2017-11-07 DIAGNOSIS — I2510 Atherosclerotic heart disease of native coronary artery without angina pectoris: Secondary | ICD-10-CM | POA: Diagnosis not present

## 2017-11-07 DIAGNOSIS — E1151 Type 2 diabetes mellitus with diabetic peripheral angiopathy without gangrene: Secondary | ICD-10-CM | POA: Diagnosis not present

## 2017-11-07 DIAGNOSIS — Z85520 Personal history of malignant carcinoid tumor of kidney: Secondary | ICD-10-CM | POA: Diagnosis not present

## 2017-11-07 DIAGNOSIS — M50121 Cervical disc disorder at C4-C5 level with radiculopathy: Secondary | ICD-10-CM | POA: Diagnosis not present

## 2017-11-07 DIAGNOSIS — E669 Obesity, unspecified: Secondary | ICD-10-CM | POA: Diagnosis not present

## 2017-11-07 DIAGNOSIS — I712 Thoracic aortic aneurysm, without rupture: Secondary | ICD-10-CM | POA: Diagnosis not present

## 2017-11-07 DIAGNOSIS — M170 Bilateral primary osteoarthritis of knee: Secondary | ICD-10-CM | POA: Diagnosis not present

## 2017-11-07 DIAGNOSIS — Z888 Allergy status to other drugs, medicaments and biological substances status: Secondary | ICD-10-CM | POA: Diagnosis not present

## 2017-11-07 DIAGNOSIS — E1139 Type 2 diabetes mellitus with other diabetic ophthalmic complication: Secondary | ICD-10-CM | POA: Diagnosis not present

## 2017-11-07 DIAGNOSIS — Z794 Long term (current) use of insulin: Secondary | ICD-10-CM | POA: Diagnosis not present

## 2017-11-07 DIAGNOSIS — Z955 Presence of coronary angioplasty implant and graft: Secondary | ICD-10-CM | POA: Diagnosis not present

## 2017-11-07 DIAGNOSIS — Z8546 Personal history of malignant neoplasm of prostate: Secondary | ICD-10-CM | POA: Diagnosis not present

## 2017-11-07 DIAGNOSIS — G4733 Obstructive sleep apnea (adult) (pediatric): Secondary | ICD-10-CM | POA: Diagnosis not present

## 2017-11-07 DIAGNOSIS — M109 Gout, unspecified: Secondary | ICD-10-CM | POA: Diagnosis not present

## 2017-11-07 DIAGNOSIS — H42 Glaucoma in diseases classified elsewhere: Secondary | ICD-10-CM | POA: Diagnosis not present

## 2017-11-07 HISTORY — DX: Chronic kidney disease, unspecified: N18.9

## 2017-11-07 HISTORY — DX: Personal history of urinary calculi: Z87.442

## 2017-11-07 LAB — CBC
HEMATOCRIT: 36.9 % — AB (ref 39.0–52.0)
HEMOGLOBIN: 12.1 g/dL — AB (ref 13.0–17.0)
MCH: 28.5 pg (ref 26.0–34.0)
MCHC: 32.8 g/dL (ref 30.0–36.0)
MCV: 87 fL (ref 78.0–100.0)
Platelets: 184 10*3/uL (ref 150–400)
RBC: 4.24 MIL/uL (ref 4.22–5.81)
RDW: 14.8 % (ref 11.5–15.5)
WBC: 5.8 10*3/uL (ref 4.0–10.5)

## 2017-11-07 LAB — TYPE AND SCREEN
ABO/RH(D): O POS
Antibody Screen: NEGATIVE

## 2017-11-07 LAB — COMPREHENSIVE METABOLIC PANEL
ALT: 23 U/L (ref 17–63)
AST: 31 U/L (ref 15–41)
Albumin: 4 g/dL (ref 3.5–5.0)
Alkaline Phosphatase: 69 U/L (ref 38–126)
Anion gap: 10 (ref 5–15)
BILIRUBIN TOTAL: 0.3 mg/dL (ref 0.3–1.2)
BUN: 32 mg/dL — AB (ref 6–20)
CO2: 21 mmol/L — ABNORMAL LOW (ref 22–32)
CREATININE: 2 mg/dL — AB (ref 0.61–1.24)
Calcium: 9.4 mg/dL (ref 8.9–10.3)
Chloride: 105 mmol/L (ref 101–111)
GFR, EST AFRICAN AMERICAN: 37 mL/min — AB (ref >60)
GFR, EST NON AFRICAN AMERICAN: 32 mL/min — AB (ref >60)
Glucose, Bld: 341 mg/dL — ABNORMAL HIGH (ref 65–99)
Potassium: 4 mmol/L (ref 3.5–5.1)
Sodium: 136 mmol/L (ref 135–145)
TOTAL PROTEIN: 7.5 g/dL (ref 6.5–8.1)

## 2017-11-07 LAB — ABO/RH: ABO/RH(D): O POS

## 2017-11-07 LAB — PROTIME-INR
INR: 0.96
PROTHROMBIN TIME: 12.7 s (ref 11.4–15.2)

## 2017-11-07 LAB — SURGICAL PCR SCREEN
MRSA, PCR: NEGATIVE
STAPHYLOCOCCUS AUREUS: NEGATIVE

## 2017-11-07 LAB — GLUCOSE, CAPILLARY: Glucose-Capillary: 315 mg/dL — ABNORMAL HIGH (ref 65–99)

## 2017-11-07 NOTE — Progress Notes (Addendum)
PCP is Dr. Maretta Bees  Cardiologist is Dr Ellyn Hack  Instructed to bring CPAP mask on the day of surgery- voices understanding. Reports that he doesn't check his CBG's, but states that today's cbg was high for him. Denies chest pain or fever, reports a productive cough at times with clear sputum.  States his last dose of Brilanta was 11-04-17 Lat A1c 9.7 done 10-19-17 (copy of labs placed in his chart)  Instructed him to check his cbg's for the next few days and watch what he eats.

## 2017-11-08 ENCOUNTER — Encounter (HOSPITAL_COMMUNITY): Payer: Self-pay

## 2017-11-08 MED ORDER — DEXTROSE 5 % IV SOLN
3.0000 g | INTRAVENOUS | Status: AC
  Administered 2017-11-09: 3 g via INTRAVENOUS
  Filled 2017-11-08: qty 3

## 2017-11-08 NOTE — Progress Notes (Signed)
Anesthesia Chart Review: Patient is a 72 year old male scheduled for C4-5, C5-6 ACDF on 11/09/17 by Dr. Marland Kitchen Ditty.   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 BMI is consistent with obesity.  - PCP is Dr. Merrilee Seashore at [facility]). Patient brought in lab copies, but office note requested, but is still pending. - Cardiologist is Dr. Glenetta Hew, last visit 06/07/17. He felt chest discomfort was likely more musculoskeletal/C5 compression and recommended f/u with neurosurgery. 05/2017 stress test did not show ischemia. Per 09/25/17 telephone encounter by Lyda Jester, PA-C, "Given past medical history and time since last visit, based on ACC/AHA guidelines, Tymel Conely would be at acceptable risk for the planned procedure without further cardiovascular testing. I spoke with patient over phone. He denies any cardiac symptoms. No exertional CP or dyspnea. He can be cleared for surgery. Dr. Ellyn Hack also said Kary Kos can be held. Hold 5 days prior to surgery."  Meds include allopurinol, a Zocor, Lumigan, Brilinta (last dose 11/04/17), cod liver oil, Trulicity, TriCor, Lasix, Neurontin, Levemir, nitroglycerin, NovoLog, Timoptic ophthalmic, Vytorin. Dr. Cyndy Freeze started him on riluzole ~ 2 weeks prior to surgery (patient does not have ALS, which is the typical indication for riluzole).   BP 137/61   Pulse 80   Temp 36.9 C   Resp 20   Ht 6' (1.829 m)   Wt 280 lb 3.2 oz (127.1 kg)   SpO2 97%   BMI 38.00 kg/m   EKG 05/27/17: SR, low voltage, precordial leads.  Nuclear stress test 05/27/17: IMPRESSION: 1. No reversible ischemia or infarction. 2. Normal left ventricular wall motion. 3. Left ventricular ejection fraction 58% 4. Non invasive  risk stratification*: Low *2012 Appropriate Use Criteria for Coronary Revascularization Focused Update: J Am Coll Cardiol. 7106;26(9):485-462. http://content.airportbarriers.com.aspx?articleid=1201161  Echo 05/10/16: Study Conclusions - Left ventricle: The cavity size was normal. There was mild   concentric hypertrophy. Systolic function was normal. The   estimated ejection fraction was in the range of 60% to 65%. Wall   motion was normal; there were no regional wall motion   abnormalities. There was an increased relative contribution of   atrial contraction to ventricular filling. Doppler parameters are   consistent with abnormal left ventricular relaxation (grade 1   diastolic dysfunction). - Aortic valve: There was mild regurgitation. - Atrial septum: No defect or patent foramen ovale was identified.  Carotid U/S 12/27/16: Heterogeneous plaque, bilaterally. 1-39% bilateral ICA stenosis. Patent vertebral arteries with antegrade flow. Normal subclavian arteries, bilaterally. Follow-up when necessary.  CT chest 05/27/17: IMPRESSION: 1. Unchanged size of ascending thoracic aortic aneurysm (4.6 cm) without intramural hematoma or evidence of rupture. Recommend semi-annual imaging followup by CTA or MRA and referral to cardiothoracic surgery if not already obtained. This recommendation follows 2010 ACCF/AHA/AATS/ACR/ASA/SCA/SCAI/SIR/STS/SVM Guidelines for the Diagnosis and Management of Patients With Thoracic Aortic Disease. Circulation. 2010; 121: V035-K093 2. Coronary artery and Aortic Atherosclerosis (ICD10-I70.0).  MRI C-spine 07/28/17: IMPRESSION: Disc degeneration and spondylosis at C4-5 with moderate spinal stenosis. Small focus of cord hyperintensity posteriorly on the left compatible with myelomalacia. Central disc protrusion C5-6 with mild to moderate spinal stenosis.  Preoperative labs noted. Cr 2.00, BUN 32. (His Cr has been in the 2 range since at least 05/2016, 1.95-2.60.  This is also consistent with labs received from PCP); Random  glucose 341. H/H 12.1/36.9, PLT 184. PT/INR 12.7/0.96. T&S done. A1c 9.7 on 10/19/17 ([facility]). I have requested PCP records to see what medication changes were made based on A1c result. Unfortunately, patient doesn't really check home CBGs, so unsure what his fasting glucose may be. I have notified Jessica at Dr. Hewitt Shorts office of poorly controlled DM and A1c result. If Dr. Cyndy Freeze still wishes to proceed then patient will get a fasting CBG on arrival--if result significantly elevated that surgery could be delayed or canceled. She will review with Dr. Cyndy Freeze.  George Hugh [facility] Short Stay Center/Anesthesiology Phone [phone] 11/08/2017 12:07 PM

## 2017-11-09 ENCOUNTER — Ambulatory Visit (HOSPITAL_COMMUNITY): Payer: Medicare Other

## 2017-11-09 ENCOUNTER — Encounter (HOSPITAL_COMMUNITY): Payer: Self-pay | Admitting: Anesthesiology

## 2017-11-09 ENCOUNTER — Ambulatory Visit (HOSPITAL_COMMUNITY)
Admission: RE | Admit: 2017-11-09 | Discharge: 2017-11-10 | Disposition: A | Discharge status: Home Health | Payer: Medicare Other | Source: Ambulatory Visit | Attending: Neurological Surgery | Admitting: Neurological Surgery

## 2017-11-09 ENCOUNTER — Ambulatory Visit (HOSPITAL_COMMUNITY): Payer: Medicare Other | Admitting: Certified Registered Nurse Anesthetist

## 2017-11-09 ENCOUNTER — Ambulatory Visit (HOSPITAL_COMMUNITY): Payer: Medicare Other | Admitting: Vascular Surgery

## 2017-11-09 ENCOUNTER — Encounter (HOSPITAL_COMMUNITY)
Admission: RE | Disposition: A | Discharge status: Home Health | Payer: Self-pay | Source: Ambulatory Visit | Attending: Neurological Surgery

## 2017-11-09 DIAGNOSIS — M109 Gout, unspecified: Secondary | ICD-10-CM | POA: Diagnosis not present

## 2017-11-09 DIAGNOSIS — M4322 Fusion of spine, cervical region: Secondary | ICD-10-CM | POA: Diagnosis not present

## 2017-11-09 DIAGNOSIS — E1151 Type 2 diabetes mellitus with diabetic peripheral angiopathy without gangrene: Secondary | ICD-10-CM | POA: Diagnosis not present

## 2017-11-09 DIAGNOSIS — I712 Thoracic aortic aneurysm, without rupture: Secondary | ICD-10-CM | POA: Diagnosis not present

## 2017-11-09 DIAGNOSIS — I10 Essential (primary) hypertension: Secondary | ICD-10-CM | POA: Diagnosis not present

## 2017-11-09 DIAGNOSIS — G4733 Obstructive sleep apnea (adult) (pediatric): Secondary | ICD-10-CM | POA: Insufficient documentation

## 2017-11-09 DIAGNOSIS — E785 Hyperlipidemia, unspecified: Secondary | ICD-10-CM | POA: Diagnosis not present

## 2017-11-09 DIAGNOSIS — E669 Obesity, unspecified: Secondary | ICD-10-CM | POA: Diagnosis not present

## 2017-11-09 DIAGNOSIS — M170 Bilateral primary osteoarthritis of knee: Secondary | ICD-10-CM | POA: Diagnosis not present

## 2017-11-09 DIAGNOSIS — Z888 Allergy status to other drugs, medicaments and biological substances status: Secondary | ICD-10-CM | POA: Insufficient documentation

## 2017-11-09 DIAGNOSIS — Z85520 Personal history of malignant carcinoid tumor of kidney: Secondary | ICD-10-CM | POA: Insufficient documentation

## 2017-11-09 DIAGNOSIS — Z419 Encounter for procedure for purposes other than remedying health state, unspecified: Secondary | ICD-10-CM

## 2017-11-09 DIAGNOSIS — M4722 Other spondylosis with radiculopathy, cervical region: Secondary | ICD-10-CM | POA: Diagnosis not present

## 2017-11-09 DIAGNOSIS — I2510 Atherosclerotic heart disease of native coronary artery without angina pectoris: Secondary | ICD-10-CM | POA: Diagnosis not present

## 2017-11-09 DIAGNOSIS — H42 Glaucoma in diseases classified elsewhere: Secondary | ICD-10-CM | POA: Insufficient documentation

## 2017-11-09 DIAGNOSIS — M50121 Cervical disc disorder at C4-C5 level with radiculopathy: Secondary | ICD-10-CM | POA: Diagnosis not present

## 2017-11-09 DIAGNOSIS — Z8546 Personal history of malignant neoplasm of prostate: Secondary | ICD-10-CM | POA: Insufficient documentation

## 2017-11-09 DIAGNOSIS — Z79899 Other long term (current) drug therapy: Secondary | ICD-10-CM | POA: Insufficient documentation

## 2017-11-09 DIAGNOSIS — Z955 Presence of coronary angioplasty implant and graft: Secondary | ICD-10-CM | POA: Insufficient documentation

## 2017-11-09 DIAGNOSIS — M4802 Spinal stenosis, cervical region: Secondary | ICD-10-CM | POA: Insufficient documentation

## 2017-11-09 DIAGNOSIS — Z794 Long term (current) use of insulin: Secondary | ICD-10-CM | POA: Insufficient documentation

## 2017-11-09 DIAGNOSIS — Z9989 Dependence on other enabling machines and devices: Secondary | ICD-10-CM | POA: Insufficient documentation

## 2017-11-09 DIAGNOSIS — E1139 Type 2 diabetes mellitus with other diabetic ophthalmic complication: Secondary | ICD-10-CM | POA: Insufficient documentation

## 2017-11-09 DIAGNOSIS — M4712 Other spondylosis with myelopathy, cervical region: Secondary | ICD-10-CM | POA: Insufficient documentation

## 2017-11-09 HISTORY — PX: ANTERIOR CERVICAL DECOMP/DISCECTOMY FUSION: SHX1161

## 2017-11-09 LAB — GLUCOSE, CAPILLARY
GLUCOSE-CAPILLARY: 229 mg/dL — AB (ref 65–99)
GLUCOSE-CAPILLARY: 317 mg/dL — AB (ref 65–99)
GLUCOSE-CAPILLARY: 348 mg/dL — AB (ref 65–99)
Glucose-Capillary: 341 mg/dL — ABNORMAL HIGH (ref 65–99)

## 2017-11-09 SURGERY — ANTERIOR CERVICAL DECOMPRESSION/DISCECTOMY FUSION 2 LEVELS
Anesthesia: General | Site: Neck

## 2017-11-09 MED ORDER — FENTANYL CITRATE (PF) 250 MCG/5ML IJ SOLN
INTRAMUSCULAR | Status: AC
  Filled 2017-11-09: qty 5

## 2017-11-09 MED ORDER — ROCURONIUM BROMIDE 10 MG/ML (PF) SYRINGE
PREFILLED_SYRINGE | INTRAVENOUS | Status: AC
  Filled 2017-11-09: qty 5

## 2017-11-09 MED ORDER — THROMBIN (RECOMBINANT) 5000 UNITS EX SOLR
OROMUCOSAL | Status: DC | PRN
  Administered 2017-11-09: 5 mL via TOPICAL

## 2017-11-09 MED ORDER — HYDROMORPHONE HCL 1 MG/ML IJ SOLN
0.2500 mg | INTRAMUSCULAR | Status: DC | PRN
  Administered 2017-11-09 (×4): 0.5 mg via INTRAVENOUS

## 2017-11-09 MED ORDER — BUPIVACAINE-EPINEPHRINE (PF) 0.5% -1:200000 IJ SOLN
INTRAMUSCULAR | Status: AC
  Filled 2017-11-09: qty 30

## 2017-11-09 MED ORDER — DEXAMETHASONE 4 MG PO TABS
4.0000 mg | ORAL_TABLET | Freq: Four times a day (QID) | ORAL | Status: DC
  Administered 2017-11-09: 4 mg via ORAL
  Filled 2017-11-09: qty 1

## 2017-11-09 MED ORDER — MEPERIDINE HCL 25 MG/ML IJ SOLN: 6.2500 mg | INTRAMUSCULAR | Status: DC | PRN

## 2017-11-09 MED ORDER — AMLODIPINE BESYLATE 5 MG PO TABS
10.0000 mg | ORAL_TABLET | Freq: Every day | ORAL | Status: DC
  Administered 2017-11-10: 10 mg via ORAL
  Filled 2017-11-09: qty 2

## 2017-11-09 MED ORDER — DIPHENHYDRAMINE HCL 25 MG PO CAPS: 25.0000 mg | ORAL_CAPSULE | Freq: Four times a day (QID) | ORAL | Status: DC | PRN

## 2017-11-09 MED ORDER — MENTHOL 3 MG MT LOZG: 1.0000 | LOZENGE | OROMUCOSAL | Status: DC | PRN

## 2017-11-09 MED ORDER — SODIUM CHLORIDE 0.9 % IR SOLN
Status: DC | PRN
  Administered 2017-11-09: 500 mL

## 2017-11-09 MED ORDER — ACETAMINOPHEN 650 MG RE SUPP: 650.0000 mg | RECTAL | Status: DC | PRN

## 2017-11-09 MED ORDER — FUROSEMIDE 20 MG PO TABS
20.0000 mg | ORAL_TABLET | Freq: Every day | ORAL | Status: DC | PRN
  Administered 2017-11-09: 20 mg via ORAL
  Filled 2017-11-09: qty 1

## 2017-11-09 MED ORDER — ZOLPIDEM TARTRATE 5 MG PO TABS: 5.0000 mg | ORAL_TABLET | Freq: Every evening | ORAL | Status: DC | PRN

## 2017-11-09 MED ORDER — PHENOL 1.4 % MT LIQD: 1.0000 | OROMUCOSAL | Status: DC | PRN

## 2017-11-09 MED ORDER — SODIUM CHLORIDE 0.9% FLUSH: 3.0000 mL | INTRAVENOUS | Status: DC | PRN

## 2017-11-09 MED ORDER — SODIUM CHLORIDE 0.9 % IV SOLN
INTRAVENOUS | Status: DC
  Administered 2017-11-09: 15:00:00 via INTRAVENOUS

## 2017-11-09 MED ORDER — INSULIN DETEMIR 100 UNIT/ML ~~LOC~~ SOLN
40.0000 [IU] | Freq: Every day | SUBCUTANEOUS | Status: DC
  Administered 2017-11-09: 40 [IU] via SUBCUTANEOUS
  Filled 2017-11-09: qty 0.4

## 2017-11-09 MED ORDER — PANTOPRAZOLE SODIUM 40 MG PO TBEC
40.0000 mg | DELAYED_RELEASE_TABLET | Freq: Every day | ORAL | Status: DC
  Administered 2017-11-09: 40 mg via ORAL
  Filled 2017-11-09: qty 1

## 2017-11-09 MED ORDER — DEXAMETHASONE SODIUM PHOSPHATE 4 MG/ML IJ SOLN: 2.0000 mg | Freq: Four times a day (QID) | INTRAMUSCULAR | Status: DC

## 2017-11-09 MED ORDER — DEXAMETHASONE SODIUM PHOSPHATE 10 MG/ML IJ SOLN
INTRAMUSCULAR | Status: DC | PRN
  Administered 2017-11-09: 10 mg via INTRAVENOUS

## 2017-11-09 MED ORDER — LIDOCAINE-EPINEPHRINE 2 %-1:100000 IJ SOLN
INTRAMUSCULAR | Status: AC
  Filled 2017-11-09: qty 1

## 2017-11-09 MED ORDER — INSULIN ASPART 100 UNIT/ML FLEXPEN
15.0000 [IU] | PEN_INJECTOR | Freq: Three times a day (TID) | SUBCUTANEOUS | Status: DC
  Filled 2017-11-09: qty 3

## 2017-11-09 MED ORDER — CHLORHEXIDINE GLUCONATE CLOTH 2 % EX PADS: 6.0000 | MEDICATED_PAD | Freq: Once | CUTANEOUS | Status: DC

## 2017-11-09 MED ORDER — PROMETHAZINE HCL 25 MG/ML IJ SOLN: 6.2500 mg | INTRAMUSCULAR | Status: DC | PRN

## 2017-11-09 MED ORDER — DEXAMETHASONE SODIUM PHOSPHATE 4 MG/ML IJ SOLN
2.0000 mg | Freq: Three times a day (TID) | INTRAMUSCULAR | Status: DC
  Administered 2017-11-09 – 2017-11-10 (×2): 2 mg via INTRAVENOUS
  Filled 2017-11-09 (×2): qty 1

## 2017-11-09 MED ORDER — CO Q-10 100 MG PO CAPS: 1.0000 | ORAL_CAPSULE | ORAL | Status: DC

## 2017-11-09 MED ORDER — BISACODYL 10 MG RE SUPP: 10.0000 mg | Freq: Every day | RECTAL | Status: DC | PRN

## 2017-11-09 MED ORDER — AMLODIPINE-OLMESARTAN 10-40 MG PO TABS: 1.0000 | ORAL_TABLET | Freq: Every day | ORAL | Status: DC

## 2017-11-09 MED ORDER — INSULIN ASPART 100 UNIT/ML ~~LOC~~ SOLN
0.0000 [IU] | Freq: Three times a day (TID) | SUBCUTANEOUS | Status: DC
  Administered 2017-11-09: 15 [IU] via SUBCUTANEOUS

## 2017-11-09 MED ORDER — FENOFIBRATE 160 MG PO TABS
160.0000 mg | ORAL_TABLET | Freq: Every day | ORAL | Status: DC
  Administered 2017-11-10: 160 mg via ORAL
  Filled 2017-11-09: qty 1

## 2017-11-09 MED ORDER — PSYLLIUM 95 % PO PACK
1.0000 | PACK | Freq: Every day | ORAL | Status: DC
  Filled 2017-11-09: qty 1

## 2017-11-09 MED ORDER — LABETALOL HCL 5 MG/ML IV SOLN
INTRAVENOUS | Status: DC | PRN
  Administered 2017-11-09: 5 mg via INTRAVENOUS

## 2017-11-09 MED ORDER — PROPOFOL 10 MG/ML IV BOLUS
INTRAVENOUS | Status: AC
  Filled 2017-11-09: qty 20

## 2017-11-09 MED ORDER — ACETAMINOPHEN 325 MG PO TABS: 650.0000 mg | ORAL_TABLET | ORAL | Status: DC | PRN

## 2017-11-09 MED ORDER — SUCCINYLCHOLINE CHLORIDE 200 MG/10ML IV SOSY
PREFILLED_SYRINGE | INTRAVENOUS | Status: AC
  Filled 2017-11-09: qty 10

## 2017-11-09 MED ORDER — ONDANSETRON HCL 4 MG/2ML IJ SOLN
INTRAMUSCULAR | Status: AC
  Filled 2017-11-09: qty 2

## 2017-11-09 MED ORDER — LACTATED RINGERS IV SOLN
INTRAVENOUS | Status: DC | PRN
  Administered 2017-11-09: 07:00:00 via INTRAVENOUS

## 2017-11-09 MED ORDER — GABAPENTIN 300 MG PO CAPS
300.0000 mg | ORAL_CAPSULE | Freq: Three times a day (TID) | ORAL | Status: DC
  Administered 2017-11-09 – 2017-11-10 (×3): 300 mg via ORAL
  Filled 2017-11-09 (×3): qty 1

## 2017-11-09 MED ORDER — DIAZEPAM 5 MG PO TABS
5.0000 mg | ORAL_TABLET | Freq: Four times a day (QID) | ORAL | Status: DC | PRN
  Administered 2017-11-09 – 2017-11-10 (×3): 5 mg via ORAL
  Filled 2017-11-09 (×2): qty 1

## 2017-11-09 MED ORDER — FENTANYL CITRATE (PF) 250 MCG/5ML IJ SOLN
INTRAMUSCULAR | Status: DC | PRN
  Administered 2017-11-09 (×3): 50 ug via INTRAVENOUS
  Administered 2017-11-09: 25 ug via INTRAVENOUS
  Administered 2017-11-09: 150 ug via INTRAVENOUS

## 2017-11-09 MED ORDER — ONDANSETRON HCL 4 MG/2ML IJ SOLN
INTRAMUSCULAR | Status: DC | PRN
  Administered 2017-11-09: 4 mg via INTRAVENOUS

## 2017-11-09 MED ORDER — DIAZEPAM 5 MG PO TABS
ORAL_TABLET | ORAL | Status: AC
  Filled 2017-11-09: qty 1

## 2017-11-09 MED ORDER — NITROGLYCERIN 0.4 MG SL SUBL: 0.4000 mg | SUBLINGUAL_TABLET | SUBLINGUAL | Status: DC | PRN

## 2017-11-09 MED ORDER — THROMBIN (RECOMBINANT) 5000 UNITS EX SOLR
CUTANEOUS | Status: AC
  Filled 2017-11-09: qty 15000

## 2017-11-09 MED ORDER — SENNA 8.6 MG PO TABS
1.0000 | ORAL_TABLET | Freq: Two times a day (BID) | ORAL | Status: DC
  Administered 2017-11-09 – 2017-11-10 (×2): 8.6 mg via ORAL
  Filled 2017-11-09 (×2): qty 1

## 2017-11-09 MED ORDER — INSULIN ASPART 100 UNIT/ML ~~LOC~~ SOLN
SUBCUTANEOUS | Status: AC
  Administered 2017-11-09: 15 [IU] via SUBCUTANEOUS
  Filled 2017-11-09: qty 1

## 2017-11-09 MED ORDER — PANTOPRAZOLE SODIUM 40 MG IV SOLR: 40.0000 mg | Freq: Every day | INTRAVENOUS | Status: DC

## 2017-11-09 MED ORDER — HYDROMORPHONE HCL 1 MG/ML IJ SOLN
INTRAMUSCULAR | Status: AC
  Administered 2017-11-09: 0.5 mg via INTRAVENOUS
  Filled 2017-11-09: qty 1

## 2017-11-09 MED ORDER — 0.9 % SODIUM CHLORIDE (POUR BTL) OPTIME
TOPICAL | Status: DC | PRN
  Administered 2017-11-09: 1000 mL

## 2017-11-09 MED ORDER — LIDOCAINE 2% (20 MG/ML) 5 ML SYRINGE
INTRAMUSCULAR | Status: AC
  Filled 2017-11-09: qty 5

## 2017-11-09 MED ORDER — SODIUM CHLORIDE 0.9% FLUSH
3.0000 mL | Freq: Two times a day (BID) | INTRAVENOUS | Status: DC
  Administered 2017-11-09 (×2): 3 mL via INTRAVENOUS

## 2017-11-09 MED ORDER — AMLODIPINE BESYLATE 5 MG PO TABS: 5.0000 mg | ORAL_TABLET | Freq: Every day | ORAL | Status: DC

## 2017-11-09 MED ORDER — ALLOPURINOL 300 MG PO TABS
300.0000 mg | ORAL_TABLET | Freq: Every day | ORAL | Status: DC
  Administered 2017-11-10: 300 mg via ORAL
  Filled 2017-11-09 (×2): qty 1

## 2017-11-09 MED ORDER — CELECOXIB 200 MG PO CAPS
200.0000 mg | ORAL_CAPSULE | Freq: Two times a day (BID) | ORAL | Status: DC
  Administered 2017-11-09 – 2017-11-10 (×2): 200 mg via ORAL
  Filled 2017-11-09 (×2): qty 1

## 2017-11-09 MED ORDER — EZETIMIBE-SIMVASTATIN 10-40 MG PO TABS
1.0000 | ORAL_TABLET | Freq: Every day | ORAL | Status: DC
  Administered 2017-11-09: 1 via ORAL
  Filled 2017-11-09: qty 1

## 2017-11-09 MED ORDER — INSULIN ASPART 100 UNIT/ML ~~LOC~~ SOLN
15.0000 [IU] | Freq: Three times a day (TID) | SUBCUTANEOUS | Status: DC
  Administered 2017-11-09 – 2017-11-10 (×2): 15 [IU] via SUBCUTANEOUS

## 2017-11-09 MED ORDER — CEFAZOLIN SODIUM-DEXTROSE 1-4 GM/50ML-% IV SOLN
1.0000 g | Freq: Three times a day (TID) | INTRAVENOUS | Status: AC
  Administered 2017-11-09 (×2): 1 g via INTRAVENOUS
  Filled 2017-11-09 (×2): qty 50

## 2017-11-09 MED ORDER — ONDANSETRON HCL 4 MG/2ML IJ SOLN: 4.0000 mg | Freq: Four times a day (QID) | INTRAMUSCULAR | Status: DC | PRN

## 2017-11-09 MED ORDER — TIMOLOL MALEATE 0.5 % OP SOLN
1.0000 [drp] | Freq: Two times a day (BID) | OPHTHALMIC | Status: DC
  Administered 2017-11-09 – 2017-11-10 (×2): 1 [drp] via OPHTHALMIC
  Filled 2017-11-09: qty 5

## 2017-11-09 MED ORDER — TAMSULOSIN HCL 0.4 MG PO CAPS
0.4000 mg | ORAL_CAPSULE | Freq: Every day | ORAL | Status: DC
  Administered 2017-11-09 – 2017-11-10 (×2): 0.4 mg via ORAL
  Filled 2017-11-09 (×2): qty 1

## 2017-11-09 MED ORDER — OXYCODONE HCL ER 15 MG PO T12A
15.0000 mg | EXTENDED_RELEASE_TABLET | Freq: Two times a day (BID) | ORAL | Status: DC
  Administered 2017-11-09 – 2017-11-10 (×3): 15 mg via ORAL
  Filled 2017-11-09 (×3): qty 1

## 2017-11-09 MED ORDER — DULAGLUTIDE 1.5 MG/0.5ML ~~LOC~~ SOAJ: 1.5000 mg | SUBCUTANEOUS | Status: DC

## 2017-11-09 MED ORDER — OXYCODONE HCL 5 MG PO TABS
5.0000 mg | ORAL_TABLET | ORAL | Status: DC | PRN
  Administered 2017-11-09: 5 mg via ORAL
  Filled 2017-11-09: qty 1

## 2017-11-09 MED ORDER — RILUZOLE 50 MG PO TABS: 50.0000 mg | ORAL_TABLET | Freq: Two times a day (BID) | ORAL | Status: DC

## 2017-11-09 MED ORDER — METHYLCELLULOSE (LAXATIVE) 500 MG PO TABS: 500.0000 mg | ORAL_TABLET | Freq: Every day | ORAL | Status: DC

## 2017-11-09 MED ORDER — ACETAMINOPHEN 500 MG PO TABS
1000.0000 mg | ORAL_TABLET | Freq: Four times a day (QID) | ORAL | Status: DC
  Administered 2017-11-09 – 2017-11-10 (×3): 1000 mg via ORAL
  Filled 2017-11-09 (×3): qty 2

## 2017-11-09 MED ORDER — IRBESARTAN 300 MG PO TABS
300.0000 mg | ORAL_TABLET | Freq: Every day | ORAL | Status: DC
  Administered 2017-11-10: 300 mg via ORAL
  Filled 2017-11-09: qty 1

## 2017-11-09 MED ORDER — FLEET ENEMA 7-19 GM/118ML RE ENEM: 1.0000 | ENEMA | Freq: Once | RECTAL | Status: DC | PRN

## 2017-11-09 MED ORDER — EPHEDRINE 5 MG/ML INJ
INTRAVENOUS | Status: AC
  Filled 2017-11-09: qty 10

## 2017-11-09 MED ORDER — BUPIVACAINE-EPINEPHRINE (PF) 0.5% -1:200000 IJ SOLN
INTRAMUSCULAR | Status: DC | PRN
  Administered 2017-11-09: 5 mL via PERINEURAL

## 2017-11-09 MED ORDER — GABAPENTIN 300 MG PO CAPS: 300.0000 mg | ORAL_CAPSULE | Freq: Three times a day (TID) | ORAL | Status: DC

## 2017-11-09 MED ORDER — ONDANSETRON HCL 4 MG PO TABS
4.0000 mg | ORAL_TABLET | Freq: Four times a day (QID) | ORAL | Status: DC | PRN
Start: 2017-11-09 — End: 2017-11-10

## 2017-11-09 MED ORDER — LATANOPROST 0.005 % OP SOLN
1.0000 [drp] | Freq: Every day | OPHTHALMIC | Status: DC
  Administered 2017-11-09: 1 [drp] via OPHTHALMIC
  Filled 2017-11-09: qty 2.5

## 2017-11-09 MED ORDER — PHENYLEPHRINE 40 MCG/ML (10ML) SYRINGE FOR IV PUSH (FOR BLOOD PRESSURE SUPPORT)
PREFILLED_SYRINGE | INTRAVENOUS | Status: AC
  Filled 2017-11-09: qty 10

## 2017-11-09 MED ORDER — LACTATED RINGERS IV SOLN: INTRAVENOUS | Status: DC

## 2017-11-09 MED ORDER — LIDOCAINE 2% (20 MG/ML) 5 ML SYRINGE
INTRAMUSCULAR | Status: DC | PRN
  Administered 2017-11-09: 60 mg via INTRAVENOUS

## 2017-11-09 MED ORDER — DEXAMETHASONE SODIUM PHOSPHATE 10 MG/ML IJ SOLN
INTRAMUSCULAR | Status: AC
  Filled 2017-11-09: qty 1

## 2017-11-09 MED ORDER — LIDOCAINE-EPINEPHRINE 2 %-1:100000 IJ SOLN
INTRAMUSCULAR | Status: DC | PRN
  Administered 2017-11-09: 5 mL

## 2017-11-09 MED ORDER — PROPOFOL 10 MG/ML IV BOLUS
INTRAVENOUS | Status: DC | PRN
  Administered 2017-11-09: 60 mg via INTRAVENOUS
  Administered 2017-11-09: 140 mg via INTRAVENOUS

## 2017-11-09 MED ORDER — DOCUSATE SODIUM 100 MG PO CAPS
100.0000 mg | ORAL_CAPSULE | Freq: Two times a day (BID) | ORAL | Status: DC
  Administered 2017-11-09 – 2017-11-10 (×2): 100 mg via ORAL
  Filled 2017-11-09 (×2): qty 1

## 2017-11-09 MED ORDER — ROCURONIUM BROMIDE 10 MG/ML (PF) SYRINGE
PREFILLED_SYRINGE | INTRAVENOUS | Status: DC | PRN
  Administered 2017-11-09 (×2): 20 mg via INTRAVENOUS
  Administered 2017-11-09: 30 mg via INTRAVENOUS
  Administered 2017-11-09: 50 mg via INTRAVENOUS

## 2017-11-09 MED ORDER — SUGAMMADEX SODIUM 200 MG/2ML IV SOLN
INTRAVENOUS | Status: DC | PRN
  Administered 2017-11-09: 300 mg via INTRAVENOUS

## 2017-11-09 MED ORDER — OXYCODONE HCL 5 MG PO TABS
10.0000 mg | ORAL_TABLET | ORAL | Status: DC | PRN
  Administered 2017-11-09 – 2017-11-10 (×5): 10 mg via ORAL
  Filled 2017-11-09 (×5): qty 2

## 2017-11-09 SURGICAL SUPPLY — 69 items
ADH SKN CLS APL DERMABOND .7 (GAUZE/BANDAGES/DRESSINGS) ×1
BIT DRILL 14MM (INSTRUMENTS) IMPLANT
BUR MATCHSTICK NEURO 3.0 LAGG (BURR) ×3 IMPLANT
CAGE CERVICAL 6 LRG 7D TI (Cage) ×2 IMPLANT
CAGE CERVICAL 7 SMALL 5D TI (Cage) ×2 IMPLANT
CANISTER SUCT 3000ML PPV (MISCELLANEOUS) ×3 IMPLANT
CARTRIDGE OIL MAESTRO DRILL (MISCELLANEOUS) ×1 IMPLANT
CHLORAPREP W/TINT 26ML (MISCELLANEOUS) ×3 IMPLANT
DECANTER SPIKE VIAL GLASS SM (MISCELLANEOUS) ×3 IMPLANT
DERMABOND ADVANCED (GAUZE/BANDAGES/DRESSINGS) ×2
DERMABOND ADVANCED .7 DNX12 (GAUZE/BANDAGES/DRESSINGS) ×1 IMPLANT
DIFFUSER DRILL AIR PNEUMATIC (MISCELLANEOUS) ×3 IMPLANT
DRAIN JACKSON PRATT 1/4 1325 (MISCELLANEOUS) ×2 IMPLANT
DRAIN SNY WOU 7FLT (WOUND CARE) ×2 IMPLANT
DRAPE C-ARM 42X72 X-RAY (DRAPES) ×6 IMPLANT
DRAPE HALF SHEET 40X57 (DRAPES) IMPLANT
DRAPE LAPAROTOMY 100X72 PEDS (DRAPES) ×3 IMPLANT
DRAPE MICROSCOPE LEICA (MISCELLANEOUS) ×3 IMPLANT
DRAPE POUCH INSTRU U-SHP 10X18 (DRAPES) ×3 IMPLANT
DRAPE SHEET LG 3/4 BI-LAMINATE (DRAPES) ×3 IMPLANT
DRILL 14MM (INSTRUMENTS) ×3
ELECT COATED BLADE 2.86 ST (ELECTRODE) ×3 IMPLANT
ELECT REM PT RETURN 9FT ADLT (ELECTROSURGICAL) ×3
ELECTRODE REM PT RTRN 9FT ADLT (ELECTROSURGICAL) ×1 IMPLANT
EVACUATOR 1/8 PVC DRAIN (DRAIN) IMPLANT
GAUZE SPONGE 4X4 12PLY STRL (GAUZE/BANDAGES/DRESSINGS) IMPLANT
GAUZE SPONGE 4X4 16PLY XRAY LF (GAUZE/BANDAGES/DRESSINGS) IMPLANT
GLOVE BIO SURGEON STRL SZ7 (GLOVE) IMPLANT
GLOVE BIOGEL PI IND STRL 7.0 (GLOVE) IMPLANT
GLOVE BIOGEL PI IND STRL 7.5 (GLOVE) ×1 IMPLANT
GLOVE BIOGEL PI INDICATOR 7.0 (GLOVE)
GLOVE BIOGEL PI INDICATOR 7.5 (GLOVE) ×2
GLOVE SS BIOGEL STRL SZ 7.5 (GLOVE) ×2 IMPLANT
GLOVE SUPERSENSE BIOGEL SZ 7.5 (GLOVE) ×4
GOWN STRL REUS W/ TWL LRG LVL3 (GOWN DISPOSABLE) ×1 IMPLANT
GOWN STRL REUS W/ TWL XL LVL3 (GOWN DISPOSABLE) ×1 IMPLANT
GOWN STRL REUS W/TWL LRG LVL3 (GOWN DISPOSABLE) ×3
GOWN STRL REUS W/TWL XL LVL3 (GOWN DISPOSABLE) ×3
HEMOSTAT POWDER KIT SURGIFOAM (HEMOSTASIS) ×3 IMPLANT
KIT BASIN OR (CUSTOM PROCEDURE TRAY) ×3 IMPLANT
KIT ROOM TURNOVER OR (KITS) ×3 IMPLANT
NDL HYPO 21X1.5 SAFETY (NEEDLE) ×1 IMPLANT
NDL SPNL 18GX3.5 QUINCKE PK (NEEDLE) ×1 IMPLANT
NEEDLE HYPO 21X1.5 SAFETY (NEEDLE) ×3 IMPLANT
NEEDLE SPNL 18GX3.5 QUINCKE PK (NEEDLE) ×3 IMPLANT
NS IRRIG 1000ML POUR BTL (IV SOLUTION) ×3 IMPLANT
OIL CARTRIDGE MAESTRO DRILL (MISCELLANEOUS) ×3
PACK LAMINECTOMY NEURO (CUSTOM PROCEDURE TRAY) ×3 IMPLANT
PAD ARMBOARD 7.5X6 YLW CONV (MISCELLANEOUS) ×3 IMPLANT
PATTIES SURGICAL .5X1.5 (GAUZE/BANDAGES/DRESSINGS) ×3 IMPLANT
PIN DISTRACTION 14MM (PIN) IMPLANT
PLATE 34MM (Plate) ×2 IMPLANT
PUTTY DBM 5CC ×2 IMPLANT
RUBBERBAND STERILE (MISCELLANEOUS) ×6 IMPLANT
SCREW 16MM (Screw) ×8 IMPLANT
SCREW FA ST 4.0 16 (Screw) ×4 IMPLANT
SPONGE INTESTINAL PEANUT (DISPOSABLE) ×3 IMPLANT
SPONGE SURGIFOAM ABS GEL SZ50 (HEMOSTASIS) IMPLANT
STAPLER VISISTAT 35W (STAPLE) ×3 IMPLANT
STOCKINETTE 6  STRL (DRAPES) ×2
STOCKINETTE 6 STRL (DRAPES) ×1 IMPLANT
SUT STRATAFIX MNCRL+ 3-0 PS-2 (SUTURE) ×2
SUT STRATAFIX MONOCRYL 3-0 (SUTURE) ×4
SUT VIC AB 3-0 SH 8-18 (SUTURE) ×3 IMPLANT
SUTURE STRATFX MNCRL+ 3-0 PS-2 (SUTURE) ×1 IMPLANT
SYR 30ML LL (SYRINGE) ×3 IMPLANT
TOWEL GREEN STERILE (TOWEL DISPOSABLE) ×3 IMPLANT
TOWEL GREEN STERILE FF (TOWEL DISPOSABLE) ×6 IMPLANT
WATER STERILE IRR 1000ML POUR (IV SOLUTION) ×3 IMPLANT

## 2017-11-09 NOTE — Transfer of Care (Signed)
Immediate Anesthesia Transfer of Care Note  Patient: Jacob Berger  Procedure(s) Performed: Cervical four-five, Cervical five-six Anterior discectomy with fusion and plate fixation (N/A Neck)  Patient Location: PACU  Anesthesia Type:General  Level of Consciousness: awake, alert , oriented and patient cooperative  Airway & Oxygen Therapy: Patient Spontanous Breathing and Patient connected to nasal cannula oxygen  Post-op Assessment: Report given to RN, Post -op Vital signs reviewed and stable and Patient moving all extremities X 4  Post vital signs: Reviewed and stable  Last Vitals:  Vitals:   11/09/17 0600  BP: (!) 144/68  Pulse: 73  Resp: 20  Temp: 36.7 C  SpO2: 97%    Last Pain:  Vitals:   11/09/17 0618  TempSrc:   PainSc: 3       Patients Stated Pain Goal: 2 (74/12/87 8676)  Complications: No apparent anesthesia complications

## 2017-11-09 NOTE — Anesthesia Procedure Notes (Signed)
Procedure Name: Intubation Date/Time: 11/09/2017 7:51 AM Performed by: Julieta Bellini, CRNA Pre-anesthesia Checklist: Patient identified, Emergency Drugs available, Suction available and Patient being monitored Patient Re-evaluated:Patient Re-evaluated prior to induction Oxygen Delivery Method: Circle system utilized Preoxygenation: Pre-oxygenation with 100% oxygen Induction Type: IV induction Ventilation: Mask ventilation without difficulty and Oral airway inserted - appropriate to patient size Laryngoscope Size: Glidescope and 4 Grade View: Grade I Tube type: Oral Tube size: 7.5 mm Number of attempts: 1 Airway Equipment and Method: Stylet Placement Confirmation: ETT inserted through vocal cords under direct vision,  positive ETCO2 and breath sounds checked- equal and bilateral Secured at: 24 cm Tube secured with: Tape Dental Injury: Teeth and Oropharynx as per pre-operative assessment  Comments: DL attempt x1 by CRNA, grade 3 view. Switched to glidescope, grade 1 view with 7.5cm successful intubation

## 2017-11-09 NOTE — Anesthesia Postprocedure Evaluation (Signed)
Anesthesia Post Note  Patient: Jacob Berger  Procedure(s) Performed: Cervical four-five, Cervical five-six Anterior discectomy with fusion and plate fixation (N/A Neck)     Patient location during evaluation: PACU Anesthesia Type: General Level of consciousness: awake and alert Pain management: pain level controlled Vital Signs Assessment: post-procedure vital signs reviewed and stable Respiratory status: spontaneous breathing, nonlabored ventilation, respiratory function stable and patient connected to nasal cannula oxygen Cardiovascular status: blood pressure returned to baseline and stable Postop Assessment: no apparent nausea or vomiting Anesthetic complications: no    Last Vitals:  Vitals:   11/09/17 1215 11/09/17 1236  BP:  (!) 151/82  Pulse:  91  Resp:  18  Temp: 36.5 C 36.9 C  SpO2:  96%                  Effie Berkshire

## 2017-11-09 NOTE — Progress Notes (Signed)
Orthopedic Tech Progress Note Patient Details:  Jacob Berger November 16, 1945 [id number]  Ortho Devices Type of Ortho Device: Soft collar Ortho Device/Splint Interventions: Application   Post Interventions Patient Tolerated: Well Instructions Provided: Care of device   [name] 11/09/2017, 5:57 PM

## 2017-11-09 NOTE — Anesthesia Preprocedure Evaluation (Addendum)
Anesthesia Evaluation  Patient identified by MRN, date of birth, ID band Patient awake    Reviewed: Allergy & Precautions, NPO status , Patient's Chart, lab work & pertinent test results  History of Anesthesia Complications (+) PONV and history of anesthetic complications  Airway Mallampati: I  TM Distance: >3 FB Neck ROM: Full    Dental  (+) Teeth Intact, Dental Advisory Given, Chipped,    Pulmonary sleep apnea and Continuous Positive Airway Pressure Ventilation , former smoker,    breath sounds clear to auscultation       Cardiovascular hypertension, Pt. on medications + CAD, + Cardiac Stents and + Peripheral Vascular Disease   Rhythm:Regular Rate:Normal     Neuro/Psych  Headaches,  Neuromuscular disease negative psych ROS   GI/Hepatic negative GI ROS, Neg liver ROS,   Endo/Other  diabetes, Type 2, Insulin Dependent  Renal/GU Renal disease  negative genitourinary   Musculoskeletal  (+) Arthritis , Osteoarthritis,    Abdominal Normal abdominal exam  (+)   Peds  Hematology   Anesthesia Other Findings   Reproductive/Obstetrics negative OB ROS                            Anesthesia Physical Anesthesia Plan  ASA: III  Anesthesia Plan: General   Post-op Pain Management:    Induction: Intravenous  PONV Risk Score and Plan: 4 or greater and Ondansetron, Dexamethasone, Midazolam and Treatment may vary due to age or medical condition  Airway Management Planned: Oral ETT and Video Laryngoscope Planned  Additional Equipment: None  Intra-op Plan:   Post-operative Plan: Extubation in OR  Informed Consent: I have reviewed the patients History and Physical, chart, labs and discussed the procedure including the risks, benefits and alternatives for the proposed anesthesia with the patient or authorized representative who has indicated his/her understanding and acceptance.   Dental advisory  given  Plan Discussed with: CRNA  Anesthesia Plan Comments:         Anesthesia Quick Evaluation

## 2017-11-09 NOTE — Progress Notes (Signed)
PHARMACIST - PHYSICIAN ORDER COMMUNICATION  CONCERNING: P&T Medication Policy on Herbal Medications  DESCRIPTION:  This patient's order for:  Co Q 10 capsule  has been noted.  This product(s) is classified as an "herbal" or natural product. Due to a lack of definitive safety studies or FDA approval, nonstandard manufacturing practices, plus the potential risk of unknown drug-drug interactions while on inpatient medications, the Pharmacy and Therapeutics Committee does not permit the use of "herbal" or natural products of this type within .   ACTION TAKEN: The pharmacy department is unable to verify this order at this time and your patient has been informed of this safety policy. Please reevaluate patient's clinical condition at discharge and address if the herbal or natural product(s) should be resumed at that time.   

## 2017-11-09 NOTE — H&P (Signed)
CC:  No chief complaint on file. Neck and arm pain; weakness  HPI: Jacob Berger is a 72 y.o. male with cervical spondylosis with myeloradiculopathy who presents for elective anterior cervical discectomy and fusion.  He denies new symptoms since I saw him in clinic.  PMH: Past Medical History:  Diagnosis Date  . Arthritis    "knees" (05/10/2016)  . CAD S/P percutaneous coronary angioplasty 2011   a. 2011: s/p PCI to PDA with Endeavor 2.5 mm x 12 mm DES - [place] [facility] b. low-risk NST in 07/2015  . Cancer of kidney ([place])   . Chronic kidney disease   . Daily headache    "recently" (05/10/2016)  . Dyslipidemia, goal LDL below 70   . Glaucoma   . Gout   . History of kidney stones   . Hypertension, essential   . Obesity, Class II, BMI 35-39.9, with comorbidity    BMI 37  . OSA on CPAP   . PONV (postoperative nausea and vomiting)    after colonoscopy  . Prostate cancer ([place]) 2007  . Rash since 04-08-16   on neck healing, right arm improving  . Thoracic aortic aneurysm without rupture ([place]) - Noted on chest CT. 4.5 cm 02/15/2016   4.5 cm per dr Cyndia Bent note and [facility] ct 01-18-16  . Type II diabetes mellitus ([facility])     PSH: Past Surgical History:  Procedure Laterality Date  . COLONOSCOPY W/ BIOPSIES AND POLYPECTOMY  "several"  . CORONARY ANGIOPLASTY WITH STENT PLACEMENT  2001   [place]: Endeavor 2.5 mm x 12 mm DES - distal PDA ([facility])  . LAPAROSCOPIC NEPHRECTOMY Left 04/13/2016   Procedure: LAPAROSCOPIC RADICAL LEFT NEPHRECTOMY;  Surgeon: Raynelle Bring, MD;  Location: [facility];  Service: Urology;  Laterality: Left;  . NM MYOVIEW LTD  June 2013; Sep 2016   a. Treadmill Myoview: 10 minutes, 12 METS; no ischemia infarction, EF 65%; b. [facility]: LOW RISK. EF 57%. 10.4 METS low risk scan no ischemia. No wall motion abnormality.   Marland Kitchen PROSTATECTOMY    . REFRACTIVE SURGERY Left     SH: Social History   Tobacco Use  . Smoking status: Former  Smoker    Types: Cigarettes    Last attempt to quit: 06/04/1993    Years since quitting: 24.4  . Smokeless tobacco: Never Used  . Tobacco comment: A PK WOULD LAST A WEEK  Substance Use Topics  . Alcohol use: Yes    Alcohol/week: 1.2 oz    Types: 1 Glasses of wine, 1 Cans of beer per week    Comment: rare  . Drug use: No    MEDS: Prior to Admission medications   Medication Sig Start Date End Date Taking? Authorizing Provider  allopurinol (ZYLOPRIM) 300 MG tablet Take 300 mg by mouth daily.    Yes [provider]  AZOR 10-40 MG per tablet TAKE 1 TABLET BY MOUTH DAILY 12/18/13  Yes Leonie Man, MD  bimatoprost (LUMIGAN) 0.01 % SOLN Place 1 drop into both eyes at bedtime.   Yes [provider]  BRILINTA 60 MG TABS tablet TAKE 1 TABLET TWICE A DAY 08/27/17  Yes Leonie Man, MD  COD LIVER OIL PO Take 1 capsule by mouth daily.   Yes [provider]  Coenzyme Q10 (CO Q-10) 100 MG CAPS Take 1 capsule by mouth 2 (two) times a week.    Yes [provider]  Dulaglutide (TRULICITY) 1.5 XN/2.3FT SOPN Inject  1.5 mg into the skin every Thursday. In the afternoon 2pm   Yes [provider]  fenofibrate (TRICOR) 145 MG tablet Take 145 mg by mouth daily at 2 PM.    Yes [provider]  furosemide (LASIX) 80 MG tablet Take 20-40 mg by mouth daily as needed (for fluid).   Yes [provider]  gabapentin (NEURONTIN) 300 MG capsule Take 300 mg by mouth 3 (three) times daily.   Yes [provider]  LEVEMIR FLEXTOUCH 100 UNIT/ML Pen Inject 40 Units into the skin at bedtime.  01/04/16  Yes [provider]  Methylcellulose, Laxative, (CITRUCEL) 500 MG TABS Take 500 mg by mouth daily.    Yes [provider]  Multiple Vitamins-Minerals (OCUVITE ADULT 50+ PO) Take 1 tablet by mouth daily at 2 PM.    Yes [provider]  NOVOLOG FLEXPEN 100 UNIT/ML FlexPen Inject 15 Units into the skin 3 (three) times daily before  meals.  08/10/16  Yes [provider]  riluzole (RILUTEK) 50 MG tablet Take 50 mg by mouth every 12 (twelve) hours.   Yes [provider]  timolol (TIMOPTIC) 0.5 % ophthalmic solution Place 1 drop into both eyes 2 (two) times daily. Morning and afternoon 10/01/17  Yes [provider]  VYTORIN 10-40 MG per tablet Take 1 tablet by mouth at bedtime.  04/27/15  Yes [provider]  diphenhydrAMINE (BENADRYL) 25 mg capsule Take 25 mg by mouth every 6 (six) hours as needed for itching.    [provider]  nitroGLYCERIN (NITROSTAT) 0.4 MG SL tablet Place 1 tablet (0.4 mg total) under the tongue every 5 (five) minutes as needed for chest pain. 01/11/17 10/29/18  Erma Heritage, PA-C    ALLERGY: Allergies  Allergen Reactions  . Accupril [Quinapril Hcl] Swelling    MOUTH SWELLING    ROS: ROS  NEUROLOGIC EXAM: Awake, alert, oriented Memory and concentration grossly intact Speech fluent, appropriate CN grossly intact Motor exam: Upper Extremities Deltoid Bicep Tricep Grip  Right 4/5 4/5 4/5 5/5  Left 4/5 4/5 4/5 5/5   Lower Extremity IP Quad PF DF EHL  Right 5/5 5/5 5/5 5/5 5/5  Left 5/5 5/5 5/5 5/5 5/5   Sensation grossly intact to LT  IMAGING: No new imaging  IMPRESSION: - 72 y.o. male with cervical spondylosis with myeloradiculopathy.  Deficits as above.  PLAN: - C4-5, C5-6 anterior discectomy with fusion and plate fixation - We have reviewed the risks, benefits, and alternatives and he wishes to proceed.

## 2017-11-09 NOTE — Anesthesia Procedure Notes (Signed)
Arterial Line Insertion Start/End1/02/2018 7:50 AM Performed by: Candis Shine, CRNA, CRNA  Patient location: OR. Preanesthetic checklist: patient identified, IV checked, site marked, risks and benefits discussed, surgical consent, monitors and equipment checked, pre-op evaluation, timeout performed and anesthesia consent Lidocaine 1% used for infiltration Left, radial was placed Catheter size: 20 G Hand hygiene performed  and maximum sterile barriers used   Attempts: 1 Procedure performed without using ultrasound guided technique. Following insertion, dressing applied and Biopatch. Post procedure assessment: normal and unchanged  Patient tolerated the procedure well with no immediate complications.

## 2017-11-09 NOTE — Evaluation (Signed)
Physical Therapy Evaluation Patient Details Name: Jacob Berger MRN: [id number] DOB: 1945-11-23 Today's Date: 11/09/2017   History of Present Illness  Pt is a 72 y/o male who presents s/p C4-C6 ACDF on 11/08/17. PMH significant for DMII, thoracic aortic aneurysm, prostate CA, gout, glaucoma, CKD.   Clinical Impression  Pt admitted with above diagnosis. Pt currently with functional limitations due to the deficits listed below (see PT Problem List). At the time of PT eval pt was very sleepy and requesting pain medication. Noted moderate trunk sway in sitting, and need for balance assist in standing. May benefit from a RW next session. Wife educated on precautions (handout provided), and recommendations for assistance at home. Wife reports that she cares for their granddaughter during the day but will be available 24 hours. Pt will benefit from skilled PT to increase their independence and safety with mobility to allow discharge to the venue listed below.       Follow Up Recommendations Home health PT;Supervision for mobility/OOB    Equipment Recommendations  3in1 (PT)    Recommendations for Other Services       Precautions / Restrictions Precautions Precautions: Fall Restrictions Weight Bearing Restrictions: No      Mobility  Bed Mobility Overal bed mobility: Needs Assistance Bed Mobility: Rolling;Sidelying to Sit Rolling: Supervision Sidelying to sit: Min assist       General bed mobility comments: Pt able to roll with increased time and use of rails for support. Appeared painful with facial grimace. Pt required min assist for trunk elevation to full sitting position.   Transfers Overall transfer level: Needs assistance Equipment used: None Transfers: Sit to/from Stand Sit to Stand: Min assist         General transfer comment: Steadying assist required as pt powered upt o full standing position.   Ambulation/Gait Ambulation/Gait assistance: Min assist Ambulation  Distance (Feet): 10 Feet Assistive device: None Gait Pattern/deviations: Step-through pattern;Decreased stride length;Trunk flexed Gait velocity: Decreased Gait velocity interpretation: Below normal speed for age/gender General Gait Details: Pt required min assist for balance and safety as he ambulated into the bathroom. Pt reaching for the wall and furniture for support.   Stairs            Wheelchair Mobility    Modified Rankin (Stroke Patients Only)       Balance Overall balance assessment: Needs assistance Sitting-balance support: Feet supported;No upper extremity supported Sitting balance-Leahy Scale: Poor Sitting balance - Comments: Noted a moderate amount of sway in trunk with static sitting.    Standing balance support: No upper extremity supported;During functional activity Standing balance-Leahy Scale: Poor                               Pertinent Vitals/Pain Pain Assessment: Faces Faces Pain Scale: Hurts little more Pain Location: posterior neck with transfer to EOB Pain Descriptors / Indicators: Operative site guarding Pain Intervention(s): Limited activity within patient's tolerance;Monitored during session;Repositioned    Home Living Family/patient expects to be discharged to:: Private residence Living Arrangements: Spouse/significant other Available Help at Discharge: Family;Available 24 hours/day Type of Home: House Home Access: Stairs to enter Entrance Stairs-Rails: Psychiatric nurse of Steps: 5 Home Layout: One level Home Equipment: None      Prior Function Level of Independence: Independent               Hand Dominance        Extremity/Trunk Assessment  Upper Extremity Assessment Upper Extremity Assessment: Defer to OT evaluation    Lower Extremity Assessment Lower Extremity Assessment: Generalized weakness    Cervical / Trunk Assessment Cervical / Trunk Assessment: Other exceptions Cervical /  Trunk Exceptions: s/p cervical surgery  Communication   Communication: No difficulties  Cognition Arousal/Alertness: Lethargic;Suspect due to medications Behavior During Therapy: [facility] for tasks assessed/performed Overall Cognitive Status: Within Functional Limits for tasks assessed                                        General Comments      Exercises     Assessment/Plan    PT Assessment Patient needs continued PT services  PT Problem List Decreased strength;Decreased range of motion;Decreased activity tolerance;Decreased balance;Decreased mobility;Decreased knowledge of use of DME;Decreased safety awareness;Decreased knowledge of precautions;Pain       PT Treatment Interventions DME instruction;Gait training;Stair training;Therapeutic activities;Functional mobility training;Therapeutic exercise;Neuromuscular re-education;Patient/family education    PT Goals (Current goals can be found in the Care Plan section)  Acute Rehab PT Goals Patient Stated Goal: Pt did not state goals other than pain control PT Goal Formulation: With patient/family Time For Goal Achievement: 11/16/17 Potential to Achieve Goals: Good    Frequency Min 5X/week   Barriers to discharge        Co-evaluation               AM-PAC PT "6 Clicks" Daily Activity  Outcome Measure Difficulty turning over in bed (including adjusting bedclothes, sheets and blankets)?: Unable Difficulty moving from lying on back to sitting on the side of the bed? : Unable Difficulty sitting down on and standing up from a chair with arms (e.g., wheelchair, bedside commode, etc,.)?: Unable Help needed moving to and from a bed to chair (including a wheelchair)?: A Little Help needed walking in hospital room?: A Little Help needed climbing 3-5 steps with a railing? : A Lot 6 Click Score: 11    End of Session Equipment Utilized During Treatment: Gait belt Activity Tolerance: Patient limited by  fatigue Patient left: Other (comment)(Sitting on toilet with wife present. RN/NT notified) Nurse Communication: Mobility status PT Visit Diagnosis: Unsteadiness on feet (R26.81);Pain;Other symptoms and signs involving the nervous system (R29.898) Pain - part of body: (neck)    Time: 3846-6599 PT Time Calculation (min) (ACUTE ONLY): 22 min   Charges:   PT Evaluation $PT Eval Moderate Complexity: 1 Mod     PT G Codes:        Rolinda Roan, PT, DPT Acute Rehabilitation Services Pager: [place] 11/09/2017, 1:59 PM

## 2017-11-09 NOTE — Brief Op Note (Signed)
11/09/2017  10:07 AM  PATIENT:  Jacob Berger  72 y.o. male  PRE-OPERATIVE DIAGNOSIS:  Cervical spondylosis with myelopathy and radiculopathy  POST-OPERATIVE DIAGNOSIS:  Cervical spondylosis with myelopathy and radiculopathy  PROCEDURE:  Procedure(s): Cervical four-five, Cervical five-six Anterior discectomy with fusion and plate fixation (N/A)  SURGEON:  Surgeon(s) and Role:    * Lillith Mcneff, Kevan Ny, MD - Primary    * Ashok Pall, MD - Assisting  PHYSICIAN ASSISTANT:   ASSISTANTS: Ashok Pall, MD   ANESTHESIA:   general  EBL:  20 mL   BLOOD ADMINISTERED:none  DRAINS: JP drain   LOCAL MEDICATIONS USED:  MARCAINE    and LIDOCAINE   SPECIMEN:  No Specimen  DISPOSITION OF SPECIMEN:  N/A  COUNTS:  YES  TOURNIQUET:  * No tourniquets in log *  DICTATION: .Dragon Dictation  PLAN OF CARE: Admit to inpatient   PATIENT DISPOSITION:  PACU - hemodynamically stable.   Delay start of Pharmacological VTE agent (>24hrs) due to surgical blood loss or risk of bleeding: yes

## 2017-11-09 NOTE — Op Note (Signed)
11/09/2017  12:17 PM  PATIENT:  Jacob Berger  72 y.o. male  PRE-OPERATIVE DIAGNOSIS:  Cervical spondylosis with myelopathy and radiculopathy  POST-OPERATIVE DIAGNOSIS:  Same  PROCEDURE:  C4-5, C5-6 anterior discectomy with fusion and plate fixation  SURGEON:  Aldean Ast, MD  ASSISTANTS: Ashok Pall, MD  ANESTHESIA:   General  DRAINS: JP   SPECIMEN:  None  INDICATION FOR PROCEDURE: 72 year old man with left arm pain and bilateral arm weakness.  I recommended the above procedure. Patient understood the risks, benefits, and alternatives and potential outcomes and wished to proceed.  PROCEDURE DETAILS: Patient was brought to the operating room placed under general endotracheal anesthesia. Patient was placed in the supine position on the operating room table. The neck was prepped with betadine and chloraprep and draped in a sterile fashion.   A transverse incision was made on the right side of the neck. Dissection was carried down thru the subcutaneous tissue and the platysma was elevated, opened vertically, and undermined with Metzenbaum scissors. Dissection was then carried out thru an avascular plane leaving the sternocleidomastoid, carotid artery, and jugular vein laterally and the trachea and esophagus medially. The ventral aspect of the vertebral column was identified and a localizing x-ray was taken. The C4-5 level was identified. The longus colli muscles were then elevated and the retractor was placed. The annulus was incised and the disc space entered. Discectomy was performed with micro-curettes and pituitary and kerrison rongeurs. I then used the high-speed drill to drill the endplates down to the level of the posterior longitudinal ligament. The operating microscope was draped and brought into the field provided additional magnification, illumination and visualization. Utilizing microsurgical technique, discectomy was continued posteriorly thru the disc space.  Posterior longitudinal ligament was opened with a nerve hook, and then removed along with disc herniation and osteophytes, decompressing the spinal canal and thecal sac. We then continued to remove osteophytic overgrowth and disc material decompressing the neural foramina and exiting nerve roots bilaterally. So by both visualization and palpation we felt we had an adequate decompression of the neural elements. We then measured the height of the intravertebral disc space and selected a porous titanium interbody cage packed with alphagraft. It was then gently positioned in the intravertebral disc space and countersunk.   The superior distraction pin was then removed and bone bleeding was stopped with bone wax. The distraction pin was inserted at the inferior level. The annulus was incised and the disc space entered. Discectomy was performed with micro-curettes and pituitary and kerrison rongeurs. I then used the high-speed drill to drill the endplates down to the level of the posterior longitudinal ligament. The operating microscope was returned to the field.  The discectomy was continued posteriorly thru the disc space. Posterior longitudinal ligament was opened with a nerve hook, and then removed along with disc herniation and osteophytes, decompressing the spinal canal and thecal sac. We then continued to remove osteophytic overgrowth and disc material decompressing the neural foramina and exiting nerve roots bilaterally. So by both visualization and palpation we felt we had an adequate decompression of the neural elements. We then measured the height of the intravertebral disc space and selected a second porous titanium interbody cage which was packed with alphagraft. It was then gently positioned in the intravertebral disc space and countersunk.   I then inserted a titanium plate and placed four variable angle screws into the two superior vertebral bodies and two fixed angle screws at the inferior level and  locked them into position. The wound was irrigated with bacitracin solution, checked for hemostasis which was established and confirmed. Once meticulous hemostasis was achieved a JP drain was placed. The platysma was closed with interrupted 3-0 undyed Vicryl suture, the subcuticular layer was closed with running undyed Vicryl suture. The skin edges were approximated with dermabond. The drapes were removed. A sterile dressing was applied. The patient was then awakened from general anesthesia and transferred to the recovery room in stable condition. At the end of the procedure all sponge, needle and instrument counts were correct.   PATIENT DISPOSITION:  PACU - hemodynamically stable.   Delay start of Pharmacological VTE agent (>24hrs) due to surgical blood loss or risk of bleeding:  yes

## 2017-11-10 DIAGNOSIS — M4802 Spinal stenosis, cervical region: Secondary | ICD-10-CM | POA: Diagnosis not present

## 2017-11-10 DIAGNOSIS — E1151 Type 2 diabetes mellitus with diabetic peripheral angiopathy without gangrene: Secondary | ICD-10-CM | POA: Diagnosis not present

## 2017-11-10 DIAGNOSIS — I2510 Atherosclerotic heart disease of native coronary artery without angina pectoris: Secondary | ICD-10-CM | POA: Diagnosis not present

## 2017-11-10 DIAGNOSIS — M50121 Cervical disc disorder at C4-C5 level with radiculopathy: Secondary | ICD-10-CM | POA: Diagnosis not present

## 2017-11-10 DIAGNOSIS — M4712 Other spondylosis with myelopathy, cervical region: Secondary | ICD-10-CM | POA: Diagnosis not present

## 2017-11-10 DIAGNOSIS — M170 Bilateral primary osteoarthritis of knee: Secondary | ICD-10-CM | POA: Diagnosis not present

## 2017-11-10 LAB — GLUCOSE, CAPILLARY: GLUCOSE-CAPILLARY: 312 mg/dL — AB (ref 65–99)

## 2017-11-10 MED ORDER — DIAZEPAM 5 MG PO TABS: 5.0000 mg | ORAL_TABLET | Freq: Four times a day (QID) | ORAL | 0 refills | Status: DC | PRN

## 2017-11-10 MED ORDER — OXYCODONE HCL 5 MG PO TABS: 5.0000 mg | ORAL_TABLET | ORAL | 0 refills | Status: DC | PRN

## 2017-11-10 NOTE — Discharge Instructions (Signed)

## 2017-11-10 NOTE — Care Management Note (Signed)
Case Management Note  Patient Details  Name: Jacob Berger MRN: [id number] Date of Birth: 1946-02-20  Subjective/Objective:      Pt from home with wife.  Presented for cervical surgery. Pt states wife will be with him most of time at home.  Pt independent or minimal assist with PT.              Action/Plan:  [facility] RW and 3in1 supplied to pt prior to d/c.  Pt presented choice of [place] agency.  [facility] chosen and referral to [place].  [facility] to start care within 48 hours.  Expected Discharge Date:  11/10/17               Expected Discharge Plan:  [place]  In-House Referral:  NA  Discharge planning Services  CM Consult  Post Acute Care Choice:  Durable Medical Equipment, Home Health Choice offered to:  Patient  DME Arranged:  Walker rolling, Bedside commode DME Agency:  [place]:  PT [facility] Agency:  [place]  Status of Service:  Completed, signed off  If discussed at [place] of Stay Meetings, dates discussed:    Additional Comments:  Arley Phenix, RN 11/10/2017, 9:25 AM

## 2017-11-10 NOTE — Evaluation (Signed)
Occupational Therapy Evaluation Patient Details Name: Jacob Berger MRN: [id number] DOB: 23-Dec-1945 Today's Date: 11/10/2017    History of Present Illness Pt is a 72 y/o male who presents s/p C4-C6 ACDF on 11/08/17. PMH significant for DMII, thoracic aortic aneurysm, prostate CA, gout, glaucoma, CKD.    Clinical Impression   PTA, pt was living with his wife and was independent. Currently, pt requires supervision and VCs for UB ADLs, Min A for LB ADLs, and Min Guard A for functional mobility. Provided education on UB ADLs, LB ADLs with AE, toilet transfer, and shower transfer with 3N1; pt demonstrated and verbalized understanding. Provided handout for AE to optimize safety and independence with LB ADLs. Answered all pt questions in preparation for dc later today. Recommend dc home once medically stable per physician. All acute OT needs met and will sign off. Thank you.     Follow Up Recommendations  No OT follow up;Supervision - Intermittent    Equipment Recommendations  3 in 1 bedside commode    Recommendations for Other Services PT consult     Precautions / Restrictions Precautions Precautions: Fall;Cervical Precaution Comments: Reviewed cervical precautions and adherance during ADLs Restrictions Weight Bearing Restrictions: No      Mobility Bed Mobility Overal bed mobility: Needs Assistance Bed Mobility: Rolling;Sidelying to Sit Rolling: Supervision Sidelying to sit: Supervision       General bed mobility comments: Increased time and effort. Supervision for safety  Transfers Overall transfer level: Needs assistance Equipment used: None Transfers: Sit to/from Stand Sit to Stand: Supervision         General transfer comment: supervision for safety; cues for safe hand placement when returning to sitting    Balance Overall balance assessment: Needs assistance Sitting-balance support: Feet supported;No upper extremity supported Sitting balance-Leahy Scale:  Good     Standing balance support: No upper extremity supported;During functional activity Standing balance-Leahy Scale: Fair                             ADL either performed or assessed with clinical judgement   ADL Overall ADL's : Needs assistance/impaired Eating/Feeding: Set up;Sitting   Grooming: Set up;Sitting Grooming Details (indicate cue type and reason): Reviewed compensatory techniques Upper Body Bathing: Minimal assistance;Sitting   Lower Body Bathing: Minimal assistance;Sit to/from stand   Upper Body Dressing : Set up;Supervision/safety;Sitting;Cueing for sequencing;Cueing for safety;Cueing for compensatory techniques Upper Body Dressing Details (indicate cue type and reason): Educated pt on compensatory techiques to adhere to cervical precautions. Pt donning shirt with good adherance to precautions and Min VCs for safe technique Lower Body Dressing: Min guard;Sit to/from stand;With adaptive equipment;Cueing for sequencing;Cueing for compensatory techniques;Cueing for back precautions;Minimal assistance Lower Body Dressing Details (indicate cue type and reason): Provided handout on AE to optimize safety and independence with LB dressing. Pt able to don underwear and pants with MIn Guard for safety. Pt donning socks with AE. Required Min A for tying shoes Toilet Transfer: Min guard;Ambulation(Simulated to recliner)         Tub/Shower Transfer Details (indicate cue type and reason): Discussed use of 3N1 for shower seat Functional mobility during ADLs: Min guard General ADL Comments: Pt presenting with flat affect and verbalizing concern with limited support at home. Educated pt on compensatory techniques and AE to increase safety and independence.     Vision Baseline Vision/History: Wears glasses Patient Visual Report: No change from baseline       Perception  Praxis      Pertinent Vitals/Pain Pain Assessment: Faces Faces Pain Scale: Hurts little  more Pain Location: posterior neck Pain Descriptors / Indicators: Operative site guarding;Sore Pain Intervention(s): Monitored during session;Limited activity within patient's tolerance;Repositioned     Hand Dominance Right   Extremity/Trunk Assessment Upper Extremity Assessment Upper Extremity Assessment: Overall WFL for tasks assessed   Lower Extremity Assessment Lower Extremity Assessment: Defer to PT evaluation   Cervical / Trunk Assessment Cervical / Trunk Assessment: Other exceptions Cervical / Trunk Exceptions: s/p cervical surgery   Communication Communication Communication: No difficulties   Cognition Arousal/Alertness: Awake/alert Behavior During Therapy: Flat affect;WFL for tasks assessed/performed Overall Cognitive Status: Within Functional Limits for tasks assessed                                     General Comments       Exercises     Shoulder Instructions      Home Living Family/patient expects to be discharged to:: Private residence Living Arrangements: Spouse/significant other Available Help at Discharge: Family;Available 24 hours/day Type of Home: House Home Access: Stairs to enter [employer] of Steps: 5 Entrance Stairs-Rails: Right;Left Home Layout: One level     Bathroom Shower/Tub: Occupational psychologist: Standard     Home Equipment: None          Prior Functioning/Environment Level of Independence: Independent                 OT Problem List: Decreased range of motion;Decreased activity tolerance;Impaired balance (sitting and/or standing);Decreased knowledge of use of DME or AE;Decreased knowledge of precautions;Pain      OT Treatment/Interventions:      OT Goals(Current goals can be found in the care plan section) Acute Rehab OT Goals Patient Stated Goal: Stop pain OT Goal Formulation: All assessment and education complete, DC therapy  OT Frequency:     Barriers to D/C:             Co-evaluation              AM-PAC PT "6 Clicks" Daily Activity     Outcome Measure Help from another person eating meals?: None Help from another person taking care of personal grooming?: None Help from another person toileting, which includes using toliet, bedpan, or urinal?: A Little Help from another person bathing (including washing, rinsing, drying)?: A Little Help from another person to put on and taking off regular upper body clothing?: A Little Help from another person to put on and taking off regular lower body clothing?: A Little 6 Click Score: 20   End of Session Equipment Utilized During Treatment: Back brace Nurse Communication: Mobility status;Precautions(DME needs)  Activity Tolerance: Patient tolerated treatment well Patient left: in chair;with call bell/phone within reach  OT Visit Diagnosis: Unsteadiness on feet (R26.81);Other abnormalities of gait and mobility (R26.89);Muscle weakness (generalized) (M62.81);Pain Pain - part of body: (Back)                Time: 1610-9604 OT Time Calculation (min): 32 min Charges:  OT General Charges $OT Visit: 1 Visit OT Evaluation $OT Eval Low Complexity: 1 Low OT Treatments $Self Care/Home Management : 8-22 mins G-Codes:     Keldric Poyer MSOT, OTR/L Acute Rehab Pager: [phone] Office: [place] 11/10/2017, 10:35 AM

## 2017-11-10 NOTE — Care Management CC44 (Signed)
Condition Code 44 Documentation Completed  Patient Details  Name: Jacob Berger MRN: [id number] Date of Birth: 1946/07/24   Condition Code 44 given:  Yes Patient signature on Condition Code 44 notice:  Yes Documentation of 2 MD's agreement:  Yes Code 44 added to claim:  Yes    Arley Phenix, RN 11/10/2017, 10:59 AM

## 2017-11-10 NOTE — Discharge Summary (Signed)
Physician Discharge Summary  Patient ID: Jacob Berger MRN: [id number] DOB/AGE: Oct 17, 1946 72 y.o.  Admit date: 11/09/2017 Discharge date: 11/10/2017  Admission Diagnoses:Cervical stenosis with myelopathy C 45 and C 56 levels  Discharge Diagnoses: Cervical stenosis with myelopathy C 45 and C 56 levels  Active Problems:   Cervical spondylosis with myelopathy   Discharged Condition: good  Hospital Course: Patient underwent anterior cervical decompression and fusion C 45 and C 56 levels.  He did well postoperatively and was discharged home in am of POD 1.  Consults: None  Significant Diagnostic Studies: None  Treatments: surgery:  Patient underwent anterior cervical decompression and fusion C 45 and C 56 levels  Discharge Exam: Blood pressure (!) 155/88, pulse 92, temperature 98 F (36.7 C), temperature source Oral, resp. rate 18, SpO2 98 %. Neurologic: Alert and oriented X 3, normal strength and tone. Normal symmetric reflexes. Normal coordination and gait  Wound: CDI  Disposition: Home  Discharge Instructions    Diet - low sodium heart healthy   Complete by:  As directed    Increase activity slowly   Complete by:  As directed      Allergies as of 11/10/2017      Reactions   Accupril [quinapril Hcl] Swelling   MOUTH SWELLING      Medication List    STOP taking these medications   BRILINTA 60 MG Tabs tablet Generic drug:  ticagrelor     TAKE these medications   allopurinol 300 MG tablet Commonly known as:  ZYLOPRIM Take 300 mg by mouth daily.   AZOR 10-40 MG tablet Generic drug:  amLODipine-olmesartan TAKE 1 TABLET BY MOUTH DAILY   CITRUCEL 500 MG Tabs Generic drug:  Methylcellulose (Laxative) Take 500 mg by mouth daily.   Co Q-10 100 MG Caps Take 1 capsule by mouth 2 (two) times a week.   COD LIVER OIL PO Take 1 capsule by mouth daily.   diazepam 5 MG tablet Commonly known as:  VALIUM Take 1 tablet (5 mg total) by mouth every 6 (six) hours as  needed for muscle spasms.   diphenhydrAMINE 25 mg capsule Commonly known as:  BENADRYL Take 25 mg by mouth every 6 (six) hours as needed for itching.   fenofibrate 145 MG tablet Commonly known as:  TRICOR Take 145 mg by mouth daily at 2 PM.   furosemide 80 MG tablet Commonly known as:  LASIX Take 20-40 mg by mouth daily as needed (for fluid).   gabapentin 300 MG capsule Commonly known as:  NEURONTIN Take 300 mg by mouth 3 (three) times daily.   LEVEMIR FLEXTOUCH 100 UNIT/ML Pen Generic drug:  Insulin Detemir Inject 40 Units into the skin at bedtime.   LUMIGAN 0.01 % Soln Generic drug:  bimatoprost Place 1 drop into both eyes at bedtime.   nitroGLYCERIN 0.4 MG SL tablet Commonly known as:  NITROSTAT Place 1 tablet (0.4 mg total) under the tongue every 5 (five) minutes as needed for chest pain.   NOVOLOG FLEXPEN 100 UNIT/ML FlexPen Generic drug:  insulin aspart Inject 15 Units into the skin 3 (three) times daily before meals.   OCUVITE ADULT 50+ PO Take 1 tablet by mouth daily at 2 PM.   oxyCODONE 5 MG immediate release tablet Commonly known as:  Oxy IR/ROXICODONE Take 1-2 tablets (5-10 mg total) by mouth every 3 (three) hours as needed for moderate pain ((score 4 to 6)).   riluzole 50 MG tablet Commonly known as:  RILUTEK Take 50 mg  by mouth every 12 (twelve) hours.   timolol 0.5 % ophthalmic solution Commonly known as:  TIMOPTIC Place 1 drop into both eyes 2 (two) times daily. Morning and afternoon   TRULICITY 1.5 TY/6.0AY Sopn Generic drug:  Dulaglutide Inject 1.5 mg into the skin every Thursday. In the afternoon 2pm   VYTORIN 10-40 MG tablet Generic drug:  ezetimibe-simvastatin Take 1 tablet by mouth at bedtime.        Signed: Peggyann Shoals, MD 11/10/2017, 7:23 AM

## 2017-11-10 NOTE — Progress Notes (Signed)
Physical Therapy Treatment Patient Details Name: Jacob Berger MRN: [id number] DOB: [birth year] Today's Date: 11/10/2017    History of Present Illness Pt is a 72 y/o male who presents s/p C4-C6 ACDF on 11/08/17. PMH significant for DMII, thoracic aortic aneurysm, prostate CA, gout, glaucoma, CKD.     PT Comments    Patient is progressing well toward mobility goals. Continues to be guarded when mobilizing. Precautions reviewed. Pt tolerated gait and stair training well. Current plan remains appropriate.    Follow Up Recommendations  Home health PT;Supervision for mobility/OOB     Equipment Recommendations  3in1 (PT);Rolling walker with 5" wheels    Recommendations for Other Services       Precautions / Restrictions Precautions Precautions: Fall Restrictions Weight Bearing Restrictions: No    Mobility  Bed Mobility               General bed mobility comments: pt OOB in chair upon arrival  Transfers Overall transfer level: Needs assistance Equipment used: None Transfers: Sit to/from Stand Sit to Stand: Supervision         General transfer comment: supervision for safety; cues for safe hand placement when returning to sitting  Ambulation/Gait Ambulation/Gait assistance: Supervision Ambulation Distance (Feet): 250 Feet Assistive device: Rolling walker (2 wheeled) Gait Pattern/deviations: Step-through pattern;Decreased stride length;Trunk flexed Gait velocity: Decreased   General Gait Details: cues for posture and increased stride length   Stairs Stairs: Yes   Stair Management: One rail Right;Step to pattern;Sideways Number of Stairs: 5 General stair comments: cues for sequencing and technique; min gaurd for safety  Wheelchair Mobility    Modified Rankin (Stroke Patients Only)       Balance Overall balance assessment: Needs assistance Sitting-balance support: Feet supported;No upper extremity supported Sitting balance-Leahy Scale: Good        Standing balance-Leahy Scale: Fair                              Cognition Arousal/Alertness: Awake/alert Behavior During Therapy: Flat affect;WFL for tasks assessed/performed Overall Cognitive Status: Within Functional Limits for tasks assessed                                        Exercises      General Comments        Pertinent Vitals/Pain Pain Assessment: Faces Faces Pain Scale: Hurts little more Pain Location: posterior neck Pain Descriptors / Indicators: Operative site guarding;Sore Pain Intervention(s): Monitored during session;Repositioned    Home Living                      Prior Function            PT Goals (current goals can now be found in the care plan section) Acute Rehab PT Goals PT Goal Formulation: With patient/family Time For Goal Achievement: 11/16/17 Potential to Achieve Goals: Good Progress towards PT goals: Progressing toward goals    Frequency    Min 5X/week      PT Plan Current plan remains appropriate    Co-evaluation              AM-PAC PT "6 Clicks" Daily Activity  Outcome Measure  Difficulty turning over in bed (including adjusting bedclothes, sheets and blankets)?: A Lot Difficulty moving from lying on back to sitting on the side of the bed? :  A Lot Difficulty sitting down on and standing up from a chair with arms (e.g., wheelchair, bedside commode, etc,.)?: A Lot Help needed moving to and from a bed to chair (including a wheelchair)?: A Little Help needed walking in hospital room?: A Little Help needed climbing 3-5 steps with a railing? : A Little 6 Click Score: 15    End of Session Equipment Utilized During Treatment: Gait belt Activity Tolerance: Patient tolerated treatment well Patient left: in chair;with call bell/phone within reach Nurse Communication: Mobility status PT Visit Diagnosis: Unsteadiness on feet (R26.81);Pain;Other symptoms and signs involving the nervous  system (R29.898) Pain - part of body: (neck)     Time: 5638-9373 PT Time Calculation (min) (ACUTE ONLY): 18 min  Charges:  $Gait Training: 8-22 mins                    G Codes:       Earney Navy, PTA Pager: [phone]     Darliss Cheney 11/10/2017, 9:28 AM

## 2017-11-13 ENCOUNTER — Encounter (HOSPITAL_COMMUNITY): Payer: Self-pay | Admitting: Neurological Surgery

## 2017-11-21 DIAGNOSIS — I129 Hypertensive chronic kidney disease with stage 1 through stage 4 chronic kidney disease, or unspecified chronic kidney disease: Secondary | ICD-10-CM | POA: Diagnosis not present

## 2017-11-21 DIAGNOSIS — N189 Chronic kidney disease, unspecified: Secondary | ICD-10-CM | POA: Diagnosis not present

## 2017-11-21 DIAGNOSIS — I2510 Atherosclerotic heart disease of native coronary artery without angina pectoris: Secondary | ICD-10-CM | POA: Diagnosis not present

## 2017-11-21 DIAGNOSIS — M4802 Spinal stenosis, cervical region: Secondary | ICD-10-CM | POA: Diagnosis not present

## 2017-11-21 DIAGNOSIS — M109 Gout, unspecified: Secondary | ICD-10-CM | POA: Diagnosis not present

## 2017-11-21 DIAGNOSIS — Z4789 Encounter for other orthopedic aftercare: Secondary | ICD-10-CM | POA: Diagnosis not present

## 2017-11-21 DIAGNOSIS — G4733 Obstructive sleep apnea (adult) (pediatric): Secondary | ICD-10-CM | POA: Diagnosis not present

## 2017-11-21 DIAGNOSIS — Z794 Long term (current) use of insulin: Secondary | ICD-10-CM | POA: Diagnosis not present

## 2017-11-21 DIAGNOSIS — Z955 Presence of coronary angioplasty implant and graft: Secondary | ICD-10-CM | POA: Diagnosis not present

## 2017-11-21 DIAGNOSIS — E1122 Type 2 diabetes mellitus with diabetic chronic kidney disease: Secondary | ICD-10-CM | POA: Diagnosis not present

## 2017-11-21 DIAGNOSIS — Z7902 Long term (current) use of antithrombotics/antiplatelets: Secondary | ICD-10-CM | POA: Diagnosis not present

## 2017-11-21 DIAGNOSIS — M4712 Other spondylosis with myelopathy, cervical region: Secondary | ICD-10-CM | POA: Diagnosis not present

## 2017-11-21 DIAGNOSIS — H409 Unspecified glaucoma: Secondary | ICD-10-CM | POA: Diagnosis not present

## 2017-11-23 ENCOUNTER — Encounter (HOSPITAL_COMMUNITY): Payer: Self-pay

## 2017-11-23 ENCOUNTER — Other Ambulatory Visit: Payer: Self-pay

## 2017-11-23 ENCOUNTER — Emergency Department (HOSPITAL_COMMUNITY)
Admission: EM | Admit: 2017-11-23 | Discharge: 2017-11-23 | Disposition: A | Discharge status: Home / Self Care | Payer: Medicare Other | Attending: Emergency Medicine | Admitting: Emergency Medicine

## 2017-11-23 DIAGNOSIS — R339 Retention of urine, unspecified: Secondary | ICD-10-CM | POA: Diagnosis not present

## 2017-11-23 DIAGNOSIS — I129 Hypertensive chronic kidney disease with stage 1 through stage 4 chronic kidney disease, or unspecified chronic kidney disease: Secondary | ICD-10-CM | POA: Diagnosis not present

## 2017-11-23 DIAGNOSIS — K2970 Gastritis, unspecified, without bleeding: Secondary | ICD-10-CM | POA: Diagnosis not present

## 2017-11-23 DIAGNOSIS — N183 Chronic kidney disease, stage 3 (moderate): Secondary | ICD-10-CM | POA: Diagnosis not present

## 2017-11-23 DIAGNOSIS — I2510 Atherosclerotic heart disease of native coronary artery without angina pectoris: Secondary | ICD-10-CM | POA: Diagnosis not present

## 2017-11-23 DIAGNOSIS — E1122 Type 2 diabetes mellitus with diabetic chronic kidney disease: Secondary | ICD-10-CM | POA: Insufficient documentation

## 2017-11-23 DIAGNOSIS — R100 Acute abdomen: Secondary | ICD-10-CM | POA: Diagnosis not present

## 2017-11-23 DIAGNOSIS — Z79899 Other long term (current) drug therapy: Secondary | ICD-10-CM | POA: Insufficient documentation

## 2017-11-23 DIAGNOSIS — R350 Frequency of micturition: Secondary | ICD-10-CM | POA: Diagnosis not present

## 2017-11-23 DIAGNOSIS — R109 Unspecified abdominal pain: Secondary | ICD-10-CM | POA: Diagnosis not present

## 2017-11-23 DIAGNOSIS — Z794 Long term (current) use of insulin: Secondary | ICD-10-CM | POA: Insufficient documentation

## 2017-11-23 DIAGNOSIS — Z87891 Personal history of nicotine dependence: Secondary | ICD-10-CM | POA: Insufficient documentation

## 2017-11-23 LAB — CBC WITH DIFFERENTIAL/PLATELET
BASOS ABS: 0 10*3/uL (ref 0.0–0.1)
Basophils Relative: 0 %
EOS PCT: 1 %
Eosinophils Absolute: 0.1 10*3/uL (ref 0.0–0.7)
HCT: 37.2 % — ABNORMAL LOW (ref 39.0–52.0)
Hemoglobin: 12.3 g/dL — ABNORMAL LOW (ref 13.0–17.0)
LYMPHS PCT: 13 %
Lymphs Abs: 1.4 10*3/uL (ref 0.7–4.0)
MCH: 28 pg (ref 26.0–34.0)
MCHC: 33.1 g/dL (ref 30.0–36.0)
MCV: 84.5 fL (ref 78.0–100.0)
MONO ABS: 0.3 10*3/uL (ref 0.1–1.0)
MONOS PCT: 3 %
NEUTROS ABS: 9.1 10*3/uL — AB (ref 1.7–7.7)
Neutrophils Relative %: 83 %
PLATELETS: 218 10*3/uL (ref 150–400)
RBC: 4.4 MIL/uL (ref 4.22–5.81)
RDW: 13.9 % (ref 11.5–15.5)
WBC: 10.8 10*3/uL — ABNORMAL HIGH (ref 4.0–10.5)

## 2017-11-23 LAB — COMPREHENSIVE METABOLIC PANEL
ALBUMIN: 3.7 g/dL (ref 3.5–5.0)
ALK PHOS: 104 U/L (ref 38–126)
ALT: 20 U/L (ref 17–63)
ANION GAP: 14 (ref 5–15)
AST: 28 U/L (ref 15–41)
BUN: 32 mg/dL — AB (ref 6–20)
CALCIUM: 9.3 mg/dL (ref 8.9–10.3)
CO2: 19 mmol/L — AB (ref 22–32)
Chloride: 101 mmol/L (ref 101–111)
Creatinine, Ser: 1.77 mg/dL — ABNORMAL HIGH (ref 0.61–1.24)
GFR calc Af Amer: 42 mL/min — ABNORMAL LOW (ref >60)
GFR calc non Af Amer: 37 mL/min — ABNORMAL LOW (ref >60)
GLUCOSE: 358 mg/dL — AB (ref 65–99)
Potassium: 4.4 mmol/L (ref 3.5–5.1)
SODIUM: 134 mmol/L — AB (ref 135–145)
TOTAL PROTEIN: 7.3 g/dL (ref 6.5–8.1)
Total Bilirubin: 0.6 mg/dL (ref 0.3–1.2)

## 2017-11-23 LAB — URINALYSIS, ROUTINE W REFLEX MICROSCOPIC
BACTERIA UA: NONE SEEN
Bilirubin Urine: NEGATIVE
Glucose, UA: >=500 mg/dL — AB
HGB URINE DIPSTICK: NEGATIVE
Ketones, ur: NEGATIVE mg/dL
Leukocytes, UA: NEGATIVE
Nitrite: NEGATIVE
Protein, ur: NEGATIVE mg/dL
SPECIFIC GRAVITY, URINE: 1.012 (ref 1.005–1.030)
Squamous Epithelial / LPF: NONE SEEN
pH: 5 (ref 5.0–8.0)

## 2017-11-23 LAB — CBG MONITORING, ED
GLUCOSE-CAPILLARY: 351 mg/dL — AB (ref 65–99)
GLUCOSE-CAPILLARY: 319 mg/dL — AB (ref 65–99)

## 2017-11-23 MED ORDER — FLEET ENEMA 7-19 GM/118ML RE ENEM
1.0000 | ENEMA | Freq: Once | RECTAL | Status: AC
  Administered 2017-11-23: 1 via RECTAL
  Filled 2017-11-23: qty 1

## 2017-11-23 MED ORDER — DOCUSATE SODIUM 250 MG PO CAPS: 250.0000 mg | ORAL_CAPSULE | Freq: Every day | ORAL | 0 refills | Status: DC

## 2017-11-23 MED ORDER — INSULIN ASPART 100 UNIT/ML ~~LOC~~ SOLN
6.0000 [IU] | Freq: Once | SUBCUTANEOUS | Status: AC
  Administered 2017-11-23: 6 [IU] via INTRAVENOUS
  Filled 2017-11-23: qty 1

## 2017-11-23 NOTE — ED Triage Notes (Signed)
[facility]- pt comong from home with c/o urinary retention and constipation X4 days. Pt had cervical fusion on 11/09/16. Pt states he feels the urge to urinate but is unable.

## 2017-11-23 NOTE — ED Notes (Signed)
Pt getting dressed with assistance from staff. Discharged to lobby to wait for Son, who called stating he would be here soon to pick pt up.

## 2017-11-23 NOTE — ED Notes (Signed)
Patient verbalizes understanding of discharge instructions. Opportunity for questioning and answers were provided. Armband removed by staff, pt discharged from ED via wheelchair accompanied by staff.

## 2017-11-23 NOTE — ED Provider Notes (Signed)
[facility] EMERGENCY DEPARTMENT Provider Note   CSN: [id number] Arrival date & time: 11/23/17  0630     History   Chief Complaint Chief Complaint  Patient presents with  . Urinary Retention  . Constipation    HPI Jacob Berger is a 72 y.o. male.  57-year-old male with prior history of CAD, dyslipidemia, CKD, obesity, thoracic aortic aneurysm, diabetes, and recent cervical fusion presents with complaint of urinary retention.  Patient reports that he has felt the urge to urinate since 10 PM last night.  He is unable to do so.  He complains of low abdominal discomfort.  He denies nausea or vomiting.  He denies fever.  He reports that his prostate has "been removed."He reports a recent cervical fusion earlier this month.  He has been doing well since that procedure.  He denies any other specific acute complaint.  He arrives by EMS.  He reports that his primary urologist is a Dr. Raynelle Bring.     The history is provided by the patient and the EMS personnel.  Urinary Frequency  This is a new problem. The current episode started 6 to 12 hours ago. The problem occurs constantly. The problem has not changed since onset.Associated symptoms include abdominal pain. Pertinent negatives include no chest pain and no headaches. Associated symptoms comments: Patient reports being unable to urinate for the last 8 hours . Nothing aggravates the symptoms. Nothing relieves the symptoms. The treatment provided no relief.    Past Medical History:  Diagnosis Date  . Arthritis    "knees" (05/10/2016)  . CAD S/P percutaneous coronary angioplasty 2011   a. 2011: s/p PCI to PDA with Endeavor 2.5 mm x 12 mm DES - [place] [facility] b. low-risk NST in 07/2015  . Cancer of kidney ([place])   . Chronic kidney disease   . Daily headache    "recently" (05/10/2016)  . Dyslipidemia, goal LDL below 70   . Glaucoma   . Gout   . History of kidney stones   . Hypertension, essential    . Obesity, Class II, BMI 35-39.9, with comorbidity    BMI 37  . OSA on CPAP   . PONV (postoperative nausea and vomiting)    after colonoscopy  . Prostate cancer ([place]) 2007  . Rash since 04-08-16   on neck healing, right arm improving  . Thoracic aortic aneurysm without rupture ([place]) - Noted on chest CT. 4.5 cm 02/15/2016   4.5 cm per dr Cyndia Bent note and [facility] ct 01-18-16  . Type II diabetes mellitus [facility])     Patient Active Problem List   Diagnosis Date Noted  . Cervical spondylosis with myelopathy 11/09/2017  . Chest pain with low risk for cardiac etiology 05/27/2017  . Kidney disease, chronic, stage III (GFR 30-59 ml/min) (HCC)   . Aortic atherosclerosis ([place])   . Gout   . Personal history of kidney cancer 04/13/2016  . Thoracic aortic aneurysm without rupture ([place]) - Noted on chest CT. 4.5 cm 02/15/2016  . Right carpal tunnel syndrome 04/28/2015  . Right cervical radiculopathy 04/28/2015  . Obesity (BMI 30-39.9) 06/05/2013  . Coronary artery disease involving native coronary artery of native heart without angina pectoris   . Hypertensive heart disease without CHF   . OSA on CPAP   . Diabetes mellitus, type II, insulin dependent ([place])   . Dyslipidemia, goal LDL below 70     Past Surgical History:  Procedure Laterality Date  . ANTERIOR  CERVICAL DECOMP/DISCECTOMY FUSION N/A 11/09/2017   Procedure: Cervical four-five, Cervical five-six Anterior discectomy with fusion and plate fixation;  Surgeon: Ditty, Kevan Ny, MD;  Location: [facility];  Service: Neurosurgery;  Laterality: N/A;  . COLONOSCOPY W/ BIOPSIES AND POLYPECTOMY  "several"  . CORONARY ANGIOPLASTY WITH STENT PLACEMENT  2001   [place]: Endeavor 2.5 mm x 12 mm DES - distal PDA ([facility] [facility])  . LAPAROSCOPIC NEPHRECTOMY Left 04/13/2016   Procedure: LAPAROSCOPIC RADICAL LEFT NEPHRECTOMY;  Surgeon: Raynelle Bring, MD;  Location: [facility];  Service: Urology;  Laterality: Left;  . NM MYOVIEW LTD  June  2013; Sep 2016   a. Treadmill Myoview: 10 minutes, 12 METS; no ischemia infarction, EF 65%; b. [facility]: LOW RISK. EF 57%. 10.4 METS low risk scan no ischemia. No wall motion abnormality.   Marland Kitchen PROSTATECTOMY    . REFRACTIVE SURGERY Left        Home Medications    Prior to Admission medications   Medication Sig Start Date End Date Taking? Authorizing Provider  allopurinol (ZYLOPRIM) 300 MG tablet Take 300 mg by mouth daily.     [provider]  AZOR 10-40 MG per tablet TAKE 1 TABLET BY MOUTH DAILY 12/18/13   Leonie Man, MD  bimatoprost (LUMIGAN) 0.01 % SOLN Place 1 drop into both eyes at bedtime.    [provider]  COD LIVER OIL PO Take 1 capsule by mouth daily.    [provider]  Coenzyme Q10 (CO Q-10) 100 MG CAPS Take 1 capsule by mouth 2 (two) times a week.     [provider]  diazepam (VALIUM) 5 MG tablet Take 1 tablet (5 mg total) by mouth every 6 (six) hours as needed for muscle spasms. 11/10/17   Erline Levine, MD  diphenhydrAMINE (BENADRYL) 25 mg capsule Take 25 mg by mouth every 6 (six) hours as needed for itching.    [provider]  Dulaglutide (TRULICITY) 1.5 HB/7.1IR SOPN Inject 1.5 mg into the skin every Thursday. In the afternoon 2pm    [provider]  fenofibrate (TRICOR) 145 MG tablet Take 145 mg by mouth daily at 2 PM.     [provider]  furosemide (LASIX) 80 MG tablet Take 20-40 mg by mouth daily as needed (for fluid).    [provider]  gabapentin (NEURONTIN) 300 MG capsule Take 300 mg by mouth 3 (three) times daily.    [provider]  LEVEMIR FLEXTOUCH 100 UNIT/ML Pen Inject 40 Units into the skin at bedtime.  01/04/16   [provider]  Methylcellulose, Laxative, (CITRUCEL) 500 MG TABS Take 500 mg by mouth daily.     [provider]  Multiple Vitamins-Minerals (OCUVITE ADULT 50+ PO) Take 1 tablet by mouth daily at 2 PM.     [provider]  nitroGLYCERIN  (NITROSTAT) 0.4 MG SL tablet Place 1 tablet (0.4 mg total) under the tongue every 5 (five) minutes as needed for chest pain. 01/11/17 10/29/18  Strader, Fransisco Hertz, PA-C  NOVOLOG FLEXPEN 100 UNIT/ML FlexPen Inject 15 Units into the skin 3 (three) times daily before meals.  08/10/16   [provider]  oxyCODONE (OXY IR/ROXICODONE) 5 MG immediate release tablet Take 1-2 tablets (5-10 mg total) by mouth every 3 (three) hours as needed for moderate pain ((score 4 to 6)). 11/10/17   Erline Levine, MD  riluzole (RILUTEK) 50 MG tablet Take 50 mg by mouth every 12 (twelve) hours.    [provider]  timolol (TIMOPTIC) 0.5 % ophthalmic solution Place 1 drop into both eyes 2 (two) times daily. Morning and afternoon 10/01/17   [provider]  VYTORIN 10-40 MG per tablet Take 1 tablet by mouth at bedtime.  04/27/15   [provider]    Family History Family History  Problem Relation Age of Onset  . Hypertension Son     Social History Social History   Tobacco Use  . Smoking status: Former Smoker    Types: Cigarettes    Last attempt to quit: 06/04/1993    Years since quitting: 24.4  . Smokeless tobacco: Never Used  . Tobacco comment: A PK WOULD LAST A WEEK  Substance Use Topics  . Alcohol use: Yes    Alcohol/week: 1.2 oz    Types: 1 Glasses of wine, 1 Cans of beer per week    Comment: rare  . Drug use: No     Allergies   Accupril [quinapril hcl]   Review of Systems Review of Systems  Cardiovascular: Negative for chest pain.  Gastrointestinal: Positive for abdominal pain.  Genitourinary: Positive for frequency.  Neurological: Negative for headaches.  All other systems reviewed and are negative.    Physical Exam Updated Vital Signs BP 122/64 (BP Location: Right Arm)   Pulse 98   Temp 97.7 F (36.5 C) (Oral)   Resp 18   SpO2 99%   Physical Exam  Constitutional: He is oriented to person, place, and time. He appears well-developed and  well-nourished. No distress.  HENT:  Head: Normocephalic and atraumatic.  Mouth/Throat: Oropharynx is clear and moist.  Eyes: Conjunctivae and EOM are normal. Pupils are equal, round, and reactive to light.  Neck: Normal range of motion. Neck supple.  Cardiovascular: Normal rate, regular rhythm and normal heart sounds.  Pulmonary/Chest: Effort normal and breath sounds normal. No respiratory distress.  Abdominal: Soft. He exhibits no distension. There is tenderness.  Mild tenderness to the low suprapubic area over a palpably distended bladder.  Musculoskeletal: Normal range of motion. He exhibits no edema or deformity.  Neurological: He is alert and oriented to person, place, and time.  Skin: Skin is warm and dry.  Psychiatric: He has a normal mood and affect.  Nursing note and vitals reviewed.    ED Treatments / Results  Labs (all labs ordered are listed, but only abnormal results are displayed) Labs Reviewed  COMPREHENSIVE METABOLIC PANEL  CBC WITH DIFFERENTIAL/PLATELET  URINALYSIS, ROUTINE W REFLEX MICROSCOPIC    EKG  EKG Interpretation None       Radiology No results found.  Procedures Procedures (including critical care time)  Medications Ordered in ED Medications - No data to display   Initial Impression / Assessment and Plan / ED Course  I have reviewed the triage vital signs and the nursing notes.  Pertinent labs & imaging results that were available during my care of the patient were reviewed by me and considered in my medical decision making (see chart for details).     MDM screen complete  Patient is presenting with urinary retention.  Symptoms improved significantly following placement of Foley catheter.  Screening labs unremarkable other than mildly elevated glucose.  Patient's creatinine is at his baseline.  Patient understands the need for close follow-up with his urologist, Dr. Alinda Money.  Strict return precautions given and understood.  Final  Clinical Impressions(s) / ED Diagnoses   Final diagnoses:  Urinary retention    ED Discharge Orders    None  Valarie Merino, MD 11/23/17 [phone]

## 2017-11-23 NOTE — ED Notes (Signed)
CBG: 351. RN notified.

## 2017-11-23 NOTE — ED Notes (Signed)
Bladder scan completed. 733 ml resulted.

## 2017-11-26 DIAGNOSIS — I2510 Atherosclerotic heart disease of native coronary artery without angina pectoris: Secondary | ICD-10-CM | POA: Diagnosis not present

## 2017-11-26 DIAGNOSIS — E1122 Type 2 diabetes mellitus with diabetic chronic kidney disease: Secondary | ICD-10-CM | POA: Diagnosis not present

## 2017-11-26 DIAGNOSIS — M4712 Other spondylosis with myelopathy, cervical region: Secondary | ICD-10-CM | POA: Diagnosis not present

## 2017-11-26 DIAGNOSIS — M4802 Spinal stenosis, cervical region: Secondary | ICD-10-CM | POA: Diagnosis not present

## 2017-11-26 DIAGNOSIS — I129 Hypertensive chronic kidney disease with stage 1 through stage 4 chronic kidney disease, or unspecified chronic kidney disease: Secondary | ICD-10-CM | POA: Diagnosis not present

## 2017-11-26 DIAGNOSIS — Z4789 Encounter for other orthopedic aftercare: Secondary | ICD-10-CM | POA: Diagnosis not present

## 2017-11-28 ENCOUNTER — Ambulatory Visit (HOSPITAL_COMMUNITY)
Admission: RE | Admit: 2017-11-28 | Discharge: 2017-11-28 | Disposition: A | Discharge status: Home / Self Care | Payer: Medicare Other | Source: Ambulatory Visit | Attending: Urology | Admitting: Urology

## 2017-11-28 ENCOUNTER — Other Ambulatory Visit (HOSPITAL_COMMUNITY): Payer: Self-pay | Admitting: Urology

## 2017-11-28 DIAGNOSIS — C642 Malignant neoplasm of left kidney, except renal pelvis: Secondary | ICD-10-CM

## 2017-11-28 DIAGNOSIS — Z4789 Encounter for other orthopedic aftercare: Secondary | ICD-10-CM | POA: Diagnosis not present

## 2017-11-28 DIAGNOSIS — M4712 Other spondylosis with myelopathy, cervical region: Secondary | ICD-10-CM | POA: Diagnosis not present

## 2017-11-28 DIAGNOSIS — C649 Malignant neoplasm of unspecified kidney, except renal pelvis: Secondary | ICD-10-CM | POA: Diagnosis not present

## 2017-11-28 DIAGNOSIS — R338 Other retention of urine: Secondary | ICD-10-CM | POA: Diagnosis not present

## 2017-11-28 DIAGNOSIS — E1122 Type 2 diabetes mellitus with diabetic chronic kidney disease: Secondary | ICD-10-CM | POA: Diagnosis not present

## 2017-11-28 DIAGNOSIS — I129 Hypertensive chronic kidney disease with stage 1 through stage 4 chronic kidney disease, or unspecified chronic kidney disease: Secondary | ICD-10-CM | POA: Diagnosis not present

## 2017-11-28 DIAGNOSIS — M4802 Spinal stenosis, cervical region: Secondary | ICD-10-CM | POA: Diagnosis not present

## 2017-11-28 DIAGNOSIS — I2510 Atherosclerotic heart disease of native coronary artery without angina pectoris: Secondary | ICD-10-CM | POA: Diagnosis not present

## 2017-11-30 DIAGNOSIS — I2510 Atherosclerotic heart disease of native coronary artery without angina pectoris: Secondary | ICD-10-CM | POA: Diagnosis not present

## 2017-11-30 DIAGNOSIS — Z4789 Encounter for other orthopedic aftercare: Secondary | ICD-10-CM | POA: Diagnosis not present

## 2017-11-30 DIAGNOSIS — M4802 Spinal stenosis, cervical region: Secondary | ICD-10-CM | POA: Diagnosis not present

## 2017-11-30 DIAGNOSIS — E1122 Type 2 diabetes mellitus with diabetic chronic kidney disease: Secondary | ICD-10-CM | POA: Diagnosis not present

## 2017-11-30 DIAGNOSIS — M4712 Other spondylosis with myelopathy, cervical region: Secondary | ICD-10-CM | POA: Diagnosis not present

## 2017-11-30 DIAGNOSIS — I129 Hypertensive chronic kidney disease with stage 1 through stage 4 chronic kidney disease, or unspecified chronic kidney disease: Secondary | ICD-10-CM | POA: Diagnosis not present

## 2017-12-04 DIAGNOSIS — I2510 Atherosclerotic heart disease of native coronary artery without angina pectoris: Secondary | ICD-10-CM | POA: Diagnosis not present

## 2017-12-04 DIAGNOSIS — Z4789 Encounter for other orthopedic aftercare: Secondary | ICD-10-CM | POA: Diagnosis not present

## 2017-12-04 DIAGNOSIS — I129 Hypertensive chronic kidney disease with stage 1 through stage 4 chronic kidney disease, or unspecified chronic kidney disease: Secondary | ICD-10-CM | POA: Diagnosis not present

## 2017-12-04 DIAGNOSIS — M4802 Spinal stenosis, cervical region: Secondary | ICD-10-CM | POA: Diagnosis not present

## 2017-12-04 DIAGNOSIS — E1122 Type 2 diabetes mellitus with diabetic chronic kidney disease: Secondary | ICD-10-CM | POA: Diagnosis not present

## 2017-12-04 DIAGNOSIS — M4712 Other spondylosis with myelopathy, cervical region: Secondary | ICD-10-CM | POA: Diagnosis not present

## 2017-12-05 DIAGNOSIS — M4802 Spinal stenosis, cervical region: Secondary | ICD-10-CM | POA: Diagnosis not present

## 2017-12-05 DIAGNOSIS — M4712 Other spondylosis with myelopathy, cervical region: Secondary | ICD-10-CM | POA: Diagnosis not present

## 2017-12-05 DIAGNOSIS — Z4789 Encounter for other orthopedic aftercare: Secondary | ICD-10-CM | POA: Diagnosis not present

## 2017-12-05 DIAGNOSIS — E1122 Type 2 diabetes mellitus with diabetic chronic kidney disease: Secondary | ICD-10-CM | POA: Diagnosis not present

## 2017-12-06 DIAGNOSIS — M4712 Other spondylosis with myelopathy, cervical region: Secondary | ICD-10-CM | POA: Diagnosis not present

## 2017-12-06 DIAGNOSIS — Z4789 Encounter for other orthopedic aftercare: Secondary | ICD-10-CM | POA: Diagnosis not present

## 2017-12-06 DIAGNOSIS — I129 Hypertensive chronic kidney disease with stage 1 through stage 4 chronic kidney disease, or unspecified chronic kidney disease: Secondary | ICD-10-CM | POA: Diagnosis not present

## 2017-12-06 DIAGNOSIS — E1122 Type 2 diabetes mellitus with diabetic chronic kidney disease: Secondary | ICD-10-CM | POA: Diagnosis not present

## 2017-12-06 DIAGNOSIS — M4802 Spinal stenosis, cervical region: Secondary | ICD-10-CM | POA: Diagnosis not present

## 2017-12-06 DIAGNOSIS — I2510 Atherosclerotic heart disease of native coronary artery without angina pectoris: Secondary | ICD-10-CM | POA: Diagnosis not present

## 2017-12-11 DIAGNOSIS — I129 Hypertensive chronic kidney disease with stage 1 through stage 4 chronic kidney disease, or unspecified chronic kidney disease: Secondary | ICD-10-CM | POA: Diagnosis not present

## 2017-12-11 DIAGNOSIS — I2510 Atherosclerotic heart disease of native coronary artery without angina pectoris: Secondary | ICD-10-CM | POA: Diagnosis not present

## 2017-12-11 DIAGNOSIS — M4802 Spinal stenosis, cervical region: Secondary | ICD-10-CM | POA: Diagnosis not present

## 2017-12-11 DIAGNOSIS — E1122 Type 2 diabetes mellitus with diabetic chronic kidney disease: Secondary | ICD-10-CM | POA: Diagnosis not present

## 2017-12-11 DIAGNOSIS — M4712 Other spondylosis with myelopathy, cervical region: Secondary | ICD-10-CM | POA: Diagnosis not present

## 2017-12-11 DIAGNOSIS — Z4789 Encounter for other orthopedic aftercare: Secondary | ICD-10-CM | POA: Diagnosis not present

## 2017-12-13 DIAGNOSIS — E1122 Type 2 diabetes mellitus with diabetic chronic kidney disease: Secondary | ICD-10-CM | POA: Diagnosis not present

## 2017-12-13 DIAGNOSIS — Z4789 Encounter for other orthopedic aftercare: Secondary | ICD-10-CM | POA: Diagnosis not present

## 2017-12-13 DIAGNOSIS — M4802 Spinal stenosis, cervical region: Secondary | ICD-10-CM | POA: Diagnosis not present

## 2017-12-13 DIAGNOSIS — I129 Hypertensive chronic kidney disease with stage 1 through stage 4 chronic kidney disease, or unspecified chronic kidney disease: Secondary | ICD-10-CM | POA: Diagnosis not present

## 2017-12-13 DIAGNOSIS — M4712 Other spondylosis with myelopathy, cervical region: Secondary | ICD-10-CM | POA: Diagnosis not present

## 2017-12-13 DIAGNOSIS — I2510 Atherosclerotic heart disease of native coronary artery without angina pectoris: Secondary | ICD-10-CM | POA: Diagnosis not present

## 2017-12-17 DIAGNOSIS — R8279 Other abnormal findings on microbiological examination of urine: Secondary | ICD-10-CM | POA: Diagnosis not present

## 2017-12-17 DIAGNOSIS — Z8546 Personal history of malignant neoplasm of prostate: Secondary | ICD-10-CM | POA: Diagnosis not present

## 2017-12-17 DIAGNOSIS — C642 Malignant neoplasm of left kidney, except renal pelvis: Secondary | ICD-10-CM | POA: Diagnosis not present

## 2017-12-18 DIAGNOSIS — I129 Hypertensive chronic kidney disease with stage 1 through stage 4 chronic kidney disease, or unspecified chronic kidney disease: Secondary | ICD-10-CM | POA: Diagnosis not present

## 2017-12-18 DIAGNOSIS — M4712 Other spondylosis with myelopathy, cervical region: Secondary | ICD-10-CM | POA: Diagnosis not present

## 2017-12-18 DIAGNOSIS — I2510 Atherosclerotic heart disease of native coronary artery without angina pectoris: Secondary | ICD-10-CM | POA: Diagnosis not present

## 2017-12-18 DIAGNOSIS — E1122 Type 2 diabetes mellitus with diabetic chronic kidney disease: Secondary | ICD-10-CM | POA: Diagnosis not present

## 2017-12-18 DIAGNOSIS — N183 Chronic kidney disease, stage 3 (moderate): Secondary | ICD-10-CM | POA: Diagnosis not present

## 2017-12-18 DIAGNOSIS — E039 Hypothyroidism, unspecified: Secondary | ICD-10-CM | POA: Diagnosis not present

## 2017-12-18 DIAGNOSIS — E1121 Type 2 diabetes mellitus with diabetic nephropathy: Secondary | ICD-10-CM | POA: Diagnosis not present

## 2017-12-18 DIAGNOSIS — E1165 Type 2 diabetes mellitus with hyperglycemia: Secondary | ICD-10-CM | POA: Diagnosis not present

## 2017-12-18 DIAGNOSIS — M4802 Spinal stenosis, cervical region: Secondary | ICD-10-CM | POA: Diagnosis not present

## 2017-12-18 DIAGNOSIS — E782 Mixed hyperlipidemia: Secondary | ICD-10-CM | POA: Diagnosis not present

## 2017-12-18 DIAGNOSIS — I10 Essential (primary) hypertension: Secondary | ICD-10-CM | POA: Diagnosis not present

## 2017-12-18 DIAGNOSIS — Z4789 Encounter for other orthopedic aftercare: Secondary | ICD-10-CM | POA: Diagnosis not present

## 2017-12-18 DIAGNOSIS — G4733 Obstructive sleep apnea (adult) (pediatric): Secondary | ICD-10-CM | POA: Diagnosis not present

## 2017-12-20 DIAGNOSIS — M4712 Other spondylosis with myelopathy, cervical region: Secondary | ICD-10-CM | POA: Diagnosis not present

## 2017-12-20 DIAGNOSIS — I2510 Atherosclerotic heart disease of native coronary artery without angina pectoris: Secondary | ICD-10-CM | POA: Diagnosis not present

## 2017-12-20 DIAGNOSIS — Z4789 Encounter for other orthopedic aftercare: Secondary | ICD-10-CM | POA: Diagnosis not present

## 2017-12-20 DIAGNOSIS — I129 Hypertensive chronic kidney disease with stage 1 through stage 4 chronic kidney disease, or unspecified chronic kidney disease: Secondary | ICD-10-CM | POA: Diagnosis not present

## 2017-12-20 DIAGNOSIS — E1122 Type 2 diabetes mellitus with diabetic chronic kidney disease: Secondary | ICD-10-CM | POA: Diagnosis not present

## 2017-12-20 DIAGNOSIS — M4802 Spinal stenosis, cervical region: Secondary | ICD-10-CM | POA: Diagnosis not present

## 2017-12-27 DIAGNOSIS — E1122 Type 2 diabetes mellitus with diabetic chronic kidney disease: Secondary | ICD-10-CM | POA: Diagnosis not present

## 2017-12-27 DIAGNOSIS — I2510 Atherosclerotic heart disease of native coronary artery without angina pectoris: Secondary | ICD-10-CM | POA: Diagnosis not present

## 2017-12-27 DIAGNOSIS — Z4789 Encounter for other orthopedic aftercare: Secondary | ICD-10-CM | POA: Diagnosis not present

## 2017-12-27 DIAGNOSIS — M4802 Spinal stenosis, cervical region: Secondary | ICD-10-CM | POA: Diagnosis not present

## 2017-12-27 DIAGNOSIS — I129 Hypertensive chronic kidney disease with stage 1 through stage 4 chronic kidney disease, or unspecified chronic kidney disease: Secondary | ICD-10-CM | POA: Diagnosis not present

## 2017-12-27 DIAGNOSIS — M4712 Other spondylosis with myelopathy, cervical region: Secondary | ICD-10-CM | POA: Diagnosis not present

## 2017-12-28 DIAGNOSIS — M4712 Other spondylosis with myelopathy, cervical region: Secondary | ICD-10-CM | POA: Diagnosis not present

## 2017-12-28 DIAGNOSIS — I2510 Atherosclerotic heart disease of native coronary artery without angina pectoris: Secondary | ICD-10-CM | POA: Diagnosis not present

## 2017-12-28 DIAGNOSIS — M4802 Spinal stenosis, cervical region: Secondary | ICD-10-CM | POA: Diagnosis not present

## 2017-12-28 DIAGNOSIS — E1122 Type 2 diabetes mellitus with diabetic chronic kidney disease: Secondary | ICD-10-CM | POA: Diagnosis not present

## 2017-12-28 DIAGNOSIS — Z4789 Encounter for other orthopedic aftercare: Secondary | ICD-10-CM | POA: Diagnosis not present

## 2017-12-28 DIAGNOSIS — I129 Hypertensive chronic kidney disease with stage 1 through stage 4 chronic kidney disease, or unspecified chronic kidney disease: Secondary | ICD-10-CM | POA: Diagnosis not present

## 2018-01-03 DIAGNOSIS — M4712 Other spondylosis with myelopathy, cervical region: Secondary | ICD-10-CM | POA: Diagnosis not present

## 2018-01-03 DIAGNOSIS — Z4789 Encounter for other orthopedic aftercare: Secondary | ICD-10-CM | POA: Diagnosis not present

## 2018-01-03 DIAGNOSIS — I129 Hypertensive chronic kidney disease with stage 1 through stage 4 chronic kidney disease, or unspecified chronic kidney disease: Secondary | ICD-10-CM | POA: Diagnosis not present

## 2018-01-03 DIAGNOSIS — M4802 Spinal stenosis, cervical region: Secondary | ICD-10-CM | POA: Diagnosis not present

## 2018-01-03 DIAGNOSIS — E1122 Type 2 diabetes mellitus with diabetic chronic kidney disease: Secondary | ICD-10-CM | POA: Diagnosis not present

## 2018-01-03 DIAGNOSIS — I2510 Atherosclerotic heart disease of native coronary artery without angina pectoris: Secondary | ICD-10-CM | POA: Diagnosis not present

## 2018-01-10 DIAGNOSIS — E1165 Type 2 diabetes mellitus with hyperglycemia: Secondary | ICD-10-CM | POA: Diagnosis not present

## 2018-01-10 DIAGNOSIS — Z125 Encounter for screening for malignant neoplasm of prostate: Secondary | ICD-10-CM | POA: Diagnosis not present

## 2018-01-10 DIAGNOSIS — I2510 Atherosclerotic heart disease of native coronary artery without angina pectoris: Secondary | ICD-10-CM | POA: Diagnosis not present

## 2018-01-10 DIAGNOSIS — I10 Essential (primary) hypertension: Secondary | ICD-10-CM | POA: Diagnosis not present

## 2018-01-10 DIAGNOSIS — G4733 Obstructive sleep apnea (adult) (pediatric): Secondary | ICD-10-CM | POA: Diagnosis not present

## 2018-01-10 DIAGNOSIS — E039 Hypothyroidism, unspecified: Secondary | ICD-10-CM | POA: Diagnosis not present

## 2018-01-16 DIAGNOSIS — I2510 Atherosclerotic heart disease of native coronary artery without angina pectoris: Secondary | ICD-10-CM | POA: Diagnosis not present

## 2018-01-16 DIAGNOSIS — E1121 Type 2 diabetes mellitus with diabetic nephropathy: Secondary | ICD-10-CM | POA: Diagnosis not present

## 2018-01-16 DIAGNOSIS — E782 Mixed hyperlipidemia: Secondary | ICD-10-CM | POA: Diagnosis not present

## 2018-01-16 DIAGNOSIS — N183 Chronic kidney disease, stage 3 (moderate): Secondary | ICD-10-CM | POA: Diagnosis not present

## 2018-01-16 DIAGNOSIS — I129 Hypertensive chronic kidney disease with stage 1 through stage 4 chronic kidney disease, or unspecified chronic kidney disease: Secondary | ICD-10-CM | POA: Diagnosis not present

## 2018-01-16 DIAGNOSIS — E1165 Type 2 diabetes mellitus with hyperglycemia: Secondary | ICD-10-CM | POA: Diagnosis not present

## 2018-01-16 DIAGNOSIS — G4733 Obstructive sleep apnea (adult) (pediatric): Secondary | ICD-10-CM | POA: Diagnosis not present

## 2018-01-16 DIAGNOSIS — I10 Essential (primary) hypertension: Secondary | ICD-10-CM | POA: Diagnosis not present

## 2018-01-17 DIAGNOSIS — I2510 Atherosclerotic heart disease of native coronary artery without angina pectoris: Secondary | ICD-10-CM | POA: Diagnosis not present

## 2018-01-17 DIAGNOSIS — E1165 Type 2 diabetes mellitus with hyperglycemia: Secondary | ICD-10-CM | POA: Diagnosis not present

## 2018-01-17 DIAGNOSIS — E782 Mixed hyperlipidemia: Secondary | ICD-10-CM | POA: Diagnosis not present

## 2018-01-17 DIAGNOSIS — I10 Essential (primary) hypertension: Secondary | ICD-10-CM | POA: Diagnosis not present

## 2018-01-24 DIAGNOSIS — I10 Essential (primary) hypertension: Secondary | ICD-10-CM | POA: Diagnosis not present

## 2018-01-24 DIAGNOSIS — M4712 Other spondylosis with myelopathy, cervical region: Secondary | ICD-10-CM | POA: Diagnosis not present

## 2018-01-24 DIAGNOSIS — Z6836 Body mass index (BMI) 36.0-36.9, adult: Secondary | ICD-10-CM | POA: Diagnosis not present

## 2018-01-24 DIAGNOSIS — M25562 Pain in left knee: Secondary | ICD-10-CM | POA: Diagnosis not present

## 2018-02-01 DIAGNOSIS — Z23 Encounter for immunization: Secondary | ICD-10-CM | POA: Diagnosis not present

## 2018-02-01 DIAGNOSIS — Z0000 Encounter for general adult medical examination without abnormal findings: Secondary | ICD-10-CM | POA: Diagnosis not present

## 2018-02-08 DIAGNOSIS — M1A00X0 Idiopathic chronic gout, unspecified site, without tophus (tophi): Secondary | ICD-10-CM | POA: Diagnosis not present

## 2018-02-08 DIAGNOSIS — I119 Hypertensive heart disease without heart failure: Secondary | ICD-10-CM | POA: Diagnosis not present

## 2018-02-08 DIAGNOSIS — E1165 Type 2 diabetes mellitus with hyperglycemia: Secondary | ICD-10-CM | POA: Diagnosis not present

## 2018-02-08 DIAGNOSIS — I2510 Atherosclerotic heart disease of native coronary artery without angina pectoris: Secondary | ICD-10-CM | POA: Diagnosis not present

## 2018-02-08 DIAGNOSIS — G4733 Obstructive sleep apnea (adult) (pediatric): Secondary | ICD-10-CM | POA: Diagnosis not present

## 2018-02-08 DIAGNOSIS — I129 Hypertensive chronic kidney disease with stage 1 through stage 4 chronic kidney disease, or unspecified chronic kidney disease: Secondary | ICD-10-CM | POA: Diagnosis not present

## 2018-02-08 DIAGNOSIS — M25562 Pain in left knee: Secondary | ICD-10-CM | POA: Diagnosis not present

## 2018-02-08 DIAGNOSIS — Z794 Long term (current) use of insulin: Secondary | ICD-10-CM | POA: Diagnosis not present

## 2018-02-08 DIAGNOSIS — N183 Chronic kidney disease, stage 3 (moderate): Secondary | ICD-10-CM | POA: Diagnosis not present

## 2018-02-08 DIAGNOSIS — E1121 Type 2 diabetes mellitus with diabetic nephropathy: Secondary | ICD-10-CM | POA: Diagnosis not present

## 2018-02-08 DIAGNOSIS — E782 Mixed hyperlipidemia: Secondary | ICD-10-CM | POA: Diagnosis not present

## 2018-02-08 DIAGNOSIS — M1712 Unilateral primary osteoarthritis, left knee: Secondary | ICD-10-CM | POA: Diagnosis not present

## 2018-02-18 DIAGNOSIS — M1611 Unilateral primary osteoarthritis, right hip: Secondary | ICD-10-CM | POA: Diagnosis not present

## 2018-02-18 DIAGNOSIS — M1612 Unilateral primary osteoarthritis, left hip: Secondary | ICD-10-CM | POA: Diagnosis not present

## 2018-02-20 ENCOUNTER — Other Ambulatory Visit: Payer: Self-pay | Admitting: *Deleted

## 2018-02-20 DIAGNOSIS — I712 Thoracic aortic aneurysm, without rupture, unspecified: Secondary | ICD-10-CM

## 2018-02-20 NOTE — Progress Notes (Signed)
Ct a 

## 2018-02-21 DIAGNOSIS — H401112 Primary open-angle glaucoma, right eye, moderate stage: Secondary | ICD-10-CM | POA: Diagnosis not present

## 2018-02-28 DIAGNOSIS — H401133 Primary open-angle glaucoma, bilateral, severe stage: Secondary | ICD-10-CM | POA: Diagnosis not present

## 2018-03-18 DIAGNOSIS — Z8546 Personal history of malignant neoplasm of prostate: Secondary | ICD-10-CM | POA: Diagnosis not present

## 2018-03-18 DIAGNOSIS — R1084 Generalized abdominal pain: Secondary | ICD-10-CM | POA: Diagnosis not present

## 2018-03-18 DIAGNOSIS — C642 Malignant neoplasm of left kidney, except renal pelvis: Secondary | ICD-10-CM | POA: Diagnosis not present

## 2018-03-19 DIAGNOSIS — M5441 Lumbago with sciatica, right side: Secondary | ICD-10-CM | POA: Diagnosis not present

## 2018-03-19 DIAGNOSIS — I129 Hypertensive chronic kidney disease with stage 1 through stage 4 chronic kidney disease, or unspecified chronic kidney disease: Secondary | ICD-10-CM | POA: Diagnosis not present

## 2018-03-19 DIAGNOSIS — E1121 Type 2 diabetes mellitus with diabetic nephropathy: Secondary | ICD-10-CM | POA: Diagnosis not present

## 2018-03-19 DIAGNOSIS — E782 Mixed hyperlipidemia: Secondary | ICD-10-CM | POA: Diagnosis not present

## 2018-03-28 DIAGNOSIS — H401133 Primary open-angle glaucoma, bilateral, severe stage: Secondary | ICD-10-CM | POA: Diagnosis not present

## 2018-03-28 DIAGNOSIS — H2513 Age-related nuclear cataract, bilateral: Secondary | ICD-10-CM | POA: Diagnosis not present

## 2018-03-28 DIAGNOSIS — I2510 Atherosclerotic heart disease of native coronary artery without angina pectoris: Secondary | ICD-10-CM | POA: Insufficient documentation

## 2018-03-28 DIAGNOSIS — I10 Essential (primary) hypertension: Secondary | ICD-10-CM | POA: Insufficient documentation

## 2018-04-02 DIAGNOSIS — H401133 Primary open-angle glaucoma, bilateral, severe stage: Secondary | ICD-10-CM | POA: Diagnosis not present

## 2018-04-03 ENCOUNTER — Other Ambulatory Visit: Payer: Self-pay

## 2018-04-03 ENCOUNTER — Other Ambulatory Visit: Payer: Self-pay | Admitting: Surgery

## 2018-04-03 ENCOUNTER — Encounter: Payer: Self-pay | Admitting: Surgery

## 2018-04-03 ENCOUNTER — Telehealth: Payer: Self-pay

## 2018-04-03 ENCOUNTER — Ambulatory Visit
Admission: RE | Admit: 2018-04-03 | Discharge: 2018-04-03 | Disposition: A | Discharge status: Home / Self Care | Payer: Medicare Other | Source: Ambulatory Visit | Attending: Surgery | Admitting: Surgery

## 2018-04-03 ENCOUNTER — Ambulatory Visit (INDEPENDENT_AMBULATORY_CARE_PROVIDER_SITE_OTHER): Payer: Medicare Other | Admitting: Surgery

## 2018-04-03 VITALS — BP 130/72 | HR 85 | Resp 16 | Ht 72.0 in | Wt 266.2 lb

## 2018-04-03 DIAGNOSIS — I712 Thoracic aortic aneurysm, without rupture, unspecified: Secondary | ICD-10-CM

## 2018-04-03 NOTE — Progress Notes (Signed)
HPI:  The patient returns today for follow-up of a 4.5 cm fusiform ascending aortic aneurysm. He is a 72 year old gentleman with a history of coronary artery disease s/p PCI and stenting of the PDA in [place], type 2 DM, hypertension,, OSA on CPAP who has been followed by Dr. Ellyn Hack for his cardiology care.  Since I saw him one year ago he has been feeling fairly well.  He does report having some shortness of breath and mild chest discomfort intermittently over the past 10 days or so.  This has occurred with getting up moving in the morning.  He has had no symptoms at rest.    Current Outpatient Medications  Medication Sig Dispense Refill  . allopurinol (ZYLOPRIM) 300 MG tablet Take 300 mg by mouth daily.     . AZOR 10-40 MG per tablet TAKE 1 TABLET BY MOUTH DAILY 90 tablet 2  . bimatoprost (LUMIGAN) 0.01 % SOLN Place 1 drop into both eyes at bedtime.    . COD LIVER OIL PO Take 1 capsule by mouth daily.    . Coenzyme Q10 (CO Q-10) 100 MG CAPS Take 1 capsule by mouth 2 (two) times a week.     . diazepam (VALIUM) 5 MG tablet Take 1 tablet (5 mg total) by mouth every 6 (six) hours as needed for muscle spasms. 30 tablet 0  . diphenhydrAMINE (BENADRYL) 25 mg capsule Take 25 mg by mouth every 6 (six) hours as needed for itching.    . docusate sodium (COLACE) 250 MG capsule Take 1 capsule (250 mg total) by mouth daily. 10 capsule 0  . Dulaglutide (TRULICITY) 1.5 VQ/0.0QQ SOPN Inject 1.5 mg into the skin every Thursday. In the afternoon 2pm    . fenofibrate (TRICOR) 145 MG tablet Take 145 mg by mouth daily at 2 PM.     . furosemide (LASIX) 80 MG tablet Take 20-40 mg by mouth daily as needed (for fluid).    Ernest Mallick FLEXTOUCH 100 UNIT/ML Pen Inject 40 Units into the skin at bedtime.     . Methylcellulose, Laxative, (CITRUCEL) 500 MG TABS Take 500 mg by mouth daily.     . Multiple Vitamins-Minerals (OCUVITE ADULT 50+ PO) Take 1 tablet by mouth daily at 2 PM.     . nitroGLYCERIN (NITROSTAT)  0.4 MG SL tablet Place 1 tablet (0.4 mg total) under the tongue every 5 (five) minutes as needed for chest pain. 25 tablet 2  . NOVOLOG FLEXPEN 100 UNIT/ML FlexPen Inject 15 Units into the skin 3 (three) times daily before meals.     Marland Kitchen oxyCODONE (OXY IR/ROXICODONE) 5 MG immediate release tablet Take 1-2 tablets (5-10 mg total) by mouth every 3 (three) hours as needed for moderate pain ((score 4 to 6)). 60 tablet 0  . riluzole (RILUTEK) 50 MG tablet Take 50 mg by mouth every 12 (twelve) hours.    . timolol (TIMOPTIC) 0.5 % ophthalmic solution Place 1 drop into both eyes 2 (two) times daily. Morning and afternoon  5  . VYTORIN 10-40 MG per tablet Take 1 tablet by mouth at bedtime.      No current facility-administered medications for this visit.    Facility-Administered Medications Ordered in Other Visits  Medication Dose Route Frequency Provider Last Rate Last Dose  . 0.9 %  sodium chloride infusion   Intra-arterial PRN Ditty, Kevan Ny, MD         Physical Exam: BP 130/72 (BP Location: Right Arm, Patient Position:  Sitting, Cuff Size: Large)   Pulse 85   Resp 16   Ht 6' (1.829 m)   Wt 266 lb 3.2 oz (120.7 kg)   SpO2 98% Comment: ON RA  BMI 36.10 kg/m  He looks well. Cardiac exam shows a regular rate and rhythm with normal heart sounds.  There is no murmur. Lung exam is clear. There is no peripheral edema.  Diagnostic Tests:  CLINICAL DATA:  Thoracic aortic aneurysm without rupture.  EXAM: CT CHEST WITHOUT CONTRAST  TECHNIQUE: Multidetector CT imaging of the chest was performed following the standard protocol without IV contrast.  COMPARISON:  CT scan of May 27, 2017.  FINDINGS: Cardiovascular: Atherosclerosis and tortuosity of thoracic aorta is noted. 4.8 cm ascending thoracic aortic aneurysm is noted which is not significantly changed compared to prior exam based on my own measurement. Atherosclerosis of coronary arteries is noted. No pericardial effusion is  noted.  Mediastinum/Nodes: No enlarged mediastinal or axillary lymph nodes. Thyroid gland, trachea, and esophagus demonstrate no significant findings.  Lungs/Pleura: Lungs are clear. No pleural effusion or pneumothorax.  Upper Abdomen: Mild cholelithiasis is noted.  Musculoskeletal: No chest wall mass or suspicious bone lesions identified.  IMPRESSION: 4.8 cm ascending thoracic aortic aneurysm. Ascending thoracic aortic aneurysm. Recommend semi-annual imaging followup by CTA or MRA and referral to cardiothoracic surgery if not already obtained. This recommendation follows 2010 ACCF/AHA/AATS/ACR/ASA/SCA/SCAI/SIR/STS/SVM Guidelines for the Diagnosis and Management of Patients With Thoracic Aortic Disease. Circulation. 2010; 121: W109-N235.  Mild cholelithiasis without inflammation.  Coronary artery calcifications suggesting coronary artery disease.  Aortic Atherosclerosis (ICD10-I70.0).   Electronically Signed   By: Marijo Conception, M.D.   On: 04/03/2018 12:32   Impression:  The fusiform ascending aortic aneurysm was measured at 4.7 cm on his CT scan today compared to 4.5 cm one year ago.  The radiologist felt that this was probably unchanged compared to the CT one year ago.  I have personally reviewed his present CT scan and compared it to his scan last year and I would agree that it is about the same.  I reviewed the images with him and answered his questions.  We did not give him any contrast today since he has already had a nephrectomy for renal cell carcinoma.  He does have extensive coronary calcification and a history of stenting in the past so his recent chest discomfort and shortness of breath is of more concern.  He is going to discuss this with Dr. Ellyn Hack who has been following him from a cardiology perspective.  Plan:  I will see him back in 1 year with a CT scan of the chest without contrast.  I spent 15 minutes performing this established patient  evaluation and > 50% of this time was spent face to face counseling and coordinating the care of this patient's aortic aneurysm.    Gaye Pollack, MD [facility] [phone]

## 2018-04-03 NOTE — Telephone Encounter (Signed)
Angela Nevin, from [facility] called and stated that Mr. Dafoe labs came back abnormal.  Creatnine 1.6 and GFR 49 pre- CT with contrast.  Patient has one kidney.  Felt it was best for patient to not do contrast at this time.  Acknowledged receipt.  Dr. Cyndia Bent made aware.

## 2018-04-08 DIAGNOSIS — Z86010 Personal history of colonic polyps: Secondary | ICD-10-CM | POA: Diagnosis not present

## 2018-04-08 DIAGNOSIS — K625 Hemorrhage of anus and rectum: Secondary | ICD-10-CM | POA: Diagnosis not present

## 2018-04-08 DIAGNOSIS — K5900 Constipation, unspecified: Secondary | ICD-10-CM | POA: Diagnosis not present

## 2018-04-09 ENCOUNTER — Telehealth: Payer: Self-pay

## 2018-04-09 NOTE — Telephone Encounter (Signed)
   [facility] Pre-operative Risk Assessment    Request for surgical clearance:  1. What type of surgery is being performed? Colonoscopy  2. When is this surgery scheduled? 04/23/18   3. What type of clearance is required (medical clearance vs. Pharmacy clearance to hold med vs. Both)? Medical  4. Are there any medications that need to be held prior to surgery and how long? N/A   5. Practice name and name of physician performing surgery? [facility]   6. What is your office phone number [phone]    7.   What is your office fax number [phone]  8.   Anesthesia type (None, local, MAC, general) ? Propofol   Meryl Crutch 04/09/2018, 3:27 PM  _________________________________________________________________   (provider comments below)

## 2018-04-10 NOTE — Telephone Encounter (Signed)
Left message for pt to call back , Per Melina Copa if we can get patient on the phone he needs a appointment ASAP, Per Dayna it is okay to use 72 hr slots.

## 2018-04-10 NOTE — Telephone Encounter (Addendum)
   Primary Cardiologist:David Ellyn Hack, MD  Chart reviewed as part of pre-operative protocol coverage. Because of Jacob Berger's past medical history and time since last visit, he/she will require a follow-up visit in order to better assess preoperative cardiovascular risk. Last OV 06/2017 at which time a f/u in 6 months was recommended, which has not yet occurred, and no appointments are pending. At [facility] 5/29 with Dr. Cyndia Bent the patient indicated intermittent chest paind and shortness of breath and he was asked to f/u in our office.  Pre-op covering staff: - Please schedule appointment ASAP and call patient to inform them. - Please contact requesting surgeon's office via preferred method (i.e, phone, fax) to inform them of need for appointment prior to surgery.  Charlie Pitter, PA-C  04/10/2018, 2:11 PM

## 2018-04-11 NOTE — Telephone Encounter (Signed)
Called pt re: sx clearance.  I left a detailed message for pt to contact the office to schedule an appt before he could be cleared for his upcoming surgery which is scheduled 04/23/18.

## 2018-04-12 ENCOUNTER — Telehealth: Payer: Self-pay | Admitting: Cardiology

## 2018-04-12 NOTE — Telephone Encounter (Signed)
Left message for pt to call.

## 2018-04-12 NOTE — Telephone Encounter (Signed)
Left message for patient to call to schedule follow up appointment.

## 2018-04-12 NOTE — Telephone Encounter (Signed)
   [facility] Pre-operative Risk Assessment    Request for surgical clearance:  1. What type of surgery is being performed? CATARACT AND TRAPECULECTOMY  2. When is this surgery scheduled? TBD   3. What type of clearance is required (medical clearance vs. Pharmacy clearance to hold med vs. Both)? BOTH  4. Are there any medications that need to be held prior to surgery and how long?BRILINTA   5. Practice name and name of physician performing surgery? DR KNIGHT   6. What is your office phone number [phone]   7.   What is your office fax number [phone]  8.   Anesthesia type (None, local, MAC, general) ? UNKNOWN   Jacob Berger 04/12/2018, 11:57 AM  _________________________________________________________________   (provider comments below)

## 2018-04-12 NOTE — Telephone Encounter (Signed)
Dr. Danelle Earthly would like to do an eye procedure asap on this pt. Per Dr. Danelle Earthly pt takes [place], but I do not see this on his med list. He needs to know if he needs to come off prior to procedure an dif so how long before? Pls call

## 2018-04-12 NOTE — Telephone Encounter (Signed)
See previous recommendations regarding office visit.  Kerin Ransom PA-C 04/12/2018 1:38 PM

## 2018-04-12 NOTE — Telephone Encounter (Signed)
Per office protocol we don't require medical clearance for cataract surgery. This patient is also going to have a trapeculectomy and we'll need to know what anesthesia is involved. We were also asked about holding Brilinta- I don't see that he is on this. It's been more than 6 months since we have seen him in the office it may be best to see him first.   Kerin Ransom PA-C 04/12/2018 1:43 PM

## 2018-04-16 NOTE — Telephone Encounter (Signed)
LMOVM OF PT HOME AND WIFE CELL NUMBER TO CONTACT CLINIC TO MAKE APPT   THIS IS THE THIRD ATTEMPT TO CONTACT PT SURGEON OFFICE CONTACTED SURGEON ON THE MATTER.  SURGEON OFFICE IS AWARE AND SAYS THEY WILL TRY ON THEIR END AGAIN IF NOT PT WILL HAVE TO RESCHEDULE.

## 2018-04-17 NOTE — Telephone Encounter (Signed)
Pt is scheduled to see Kerin Ransom, PA-C 04/22/18.

## 2018-04-17 NOTE — Telephone Encounter (Signed)
Pt is scheduled to see Kerin Ransom, PA-C, 04/22/18 for preop clearance.

## 2018-04-22 ENCOUNTER — Encounter: Payer: Self-pay | Admitting: Cardiology

## 2018-04-22 ENCOUNTER — Ambulatory Visit (INDEPENDENT_AMBULATORY_CARE_PROVIDER_SITE_OTHER): Payer: Medicare Other | Admitting: Cardiology

## 2018-04-22 VITALS — BP 128/68 | HR 74 | Ht 72.0 in | Wt 266.6 lb

## 2018-04-22 DIAGNOSIS — Z9989 Dependence on other enabling machines and devices: Secondary | ICD-10-CM

## 2018-04-22 DIAGNOSIS — G4733 Obstructive sleep apnea (adult) (pediatric): Secondary | ICD-10-CM

## 2018-04-22 DIAGNOSIS — I119 Hypertensive heart disease without heart failure: Secondary | ICD-10-CM

## 2018-04-22 DIAGNOSIS — R079 Chest pain, unspecified: Secondary | ICD-10-CM

## 2018-04-22 DIAGNOSIS — E119 Type 2 diabetes mellitus without complications: Secondary | ICD-10-CM

## 2018-04-22 DIAGNOSIS — Z9861 Coronary angioplasty status: Secondary | ICD-10-CM | POA: Diagnosis not present

## 2018-04-22 DIAGNOSIS — I2510 Atherosclerotic heart disease of native coronary artery without angina pectoris: Secondary | ICD-10-CM

## 2018-04-22 DIAGNOSIS — M47812 Spondylosis without myelopathy or radiculopathy, cervical region: Secondary | ICD-10-CM | POA: Diagnosis not present

## 2018-04-22 DIAGNOSIS — I712 Thoracic aortic aneurysm, without rupture, unspecified: Secondary | ICD-10-CM

## 2018-04-22 DIAGNOSIS — Z01810 Encounter for preprocedural cardiovascular examination: Secondary | ICD-10-CM | POA: Diagnosis not present

## 2018-04-22 DIAGNOSIS — N183 Chronic kidney disease, stage 3 unspecified: Secondary | ICD-10-CM

## 2018-04-22 DIAGNOSIS — Z794 Long term (current) use of insulin: Secondary | ICD-10-CM | POA: Diagnosis not present

## 2018-04-22 DIAGNOSIS — E785 Hyperlipidemia, unspecified: Secondary | ICD-10-CM

## 2018-04-22 MED ORDER — NITROGLYCERIN 0.4 MG SL SUBL: 0.4000 mg | SUBLINGUAL_TABLET | SUBLINGUAL | 2 refills | Status: DC | PRN

## 2018-04-22 NOTE — Progress Notes (Signed)
OK to hold [place] for [place].  [place]

## 2018-04-22 NOTE — Assessment & Plan Note (Signed)
Pt to have a colonoscopy and cataract surgery- he is on Brilinta

## 2018-04-22 NOTE — Assessment & Plan Note (Signed)
Atypical chest pain. Would consider Myoview if this recurs. Rx for SL NTG provided

## 2018-04-22 NOTE — Progress Notes (Addendum)
04/22/2018 Jacob Berger   [birth year]  [id number]  Primary Physician Merrilee Seashore, MD Primary Cardiologist: Dr Ellyn Hack  HPI:  72year old gentleman with a history of CAD, s/p PCI and stenting of the PDA in [place] 2011, Myoview was low risk July 2018. 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 He recentyl saw Dr Cyndia Bent in the office. He did report having some shortness of breath and mild chest discomfort intermittently over the past 10 days or so. This has occurred with getting up moving in the morning.  He has had no symptoms at rest. He is seen in the office today for follow up.   The pt tells me he had some vague chest discomfort in the mornings for about a week. This was two weeks ago. He has not had any since. He walks 1/2 mile a day. He needs to have a colonoscopy tomorrow and eye surgery (local anesthesia) in the future. Apparently th patient is still on Brilinta. This was listed in his discharge medication from July 2018 but not carried over on his Med Rec. He thinks he held it for his neck surgery in Jan but I could find no notes to that effect. Brilinta has not been listed in any office note since.    Current Outpatient Medications  Medication Sig Dispense Refill  . allopurinol (ZYLOPRIM) 300 MG tablet Take 300 mg by mouth daily.     . AZOR 10-40 MG per tablet TAKE 1 TABLET BY MOUTH DAILY 90 tablet 2  . bimatoprost (LUMIGAN) 0.01 % SOLN Place 1 drop into both eyes at bedtime.    . COD LIVER OIL PO Take 1 capsule by mouth daily.    . Coenzyme Q10 (CO Q-10) 100 MG CAPS Take 1 capsule by mouth 2 (two) times a week.     . diazepam (VALIUM) 5 MG tablet Take 1 tablet (5 mg total) by mouth every 6 (six) hours as needed for muscle spasms. 30 tablet 0  . diphenhydrAMINE (BENADRYL) 25 mg capsule Take 25 mg by mouth every 6  (six) hours as needed for itching.    . docusate sodium (COLACE) 250 MG capsule Take 1 capsule (250 mg total) by mouth daily. 10 capsule 0  . Dulaglutide (TRULICITY) 1.5 HC/6.2BJ SOPN Inject 1.5 mg into the skin every Thursday. In the afternoon 2pm    . fenofibrate (TRICOR) 145 MG tablet Take 145 mg by mouth daily at 2 PM.     . furosemide (LASIX) 80 MG tablet Take 20-40 mg by mouth daily as needed (for fluid).    Ernest Mallick FLEXTOUCH 100 UNIT/ML Pen Inject 40 Units into the skin at bedtime.     . Methylcellulose, Laxative, (CITRUCEL) 500 MG TABS Take 500 mg by mouth daily.     . Multiple Vitamins-Minerals (OCUVITE ADULT 50+ PO) Take 1 tablet by mouth daily at 2 PM.     . nitroGLYCERIN (NITROSTAT) 0.4 MG SL tablet Place 1 tablet (0.4 mg total) under the tongue every 5 (five) minutes as needed for chest pain. 25 tablet 2  . NOVOLOG FLEXPEN 100 UNIT/ML FlexPen Inject 15 Units into the skin 3 (three) times daily before meals.     Marland Kitchen oxyCODONE (OXY IR/ROXICODONE) 5 MG immediate release tablet Take 1-2 tablets (5-10 mg total) by mouth every 3 (three) hours as needed for  moderate pain ((score 4 to 6)). 60 tablet 0  . timolol (TIMOPTIC) 0.5 % ophthalmic solution Place 1 drop into both eyes 2 (two) times daily. Morning and afternoon  5  . VYTORIN 10-40 MG per tablet Take 1 tablet by mouth at bedtime.      No current facility-administered medications for this visit.    Facility-Administered Medications Ordered in Other Visits  Medication Dose Route Frequency Provider Last Rate Last Dose  . 0.9 %  sodium chloride infusion   Intra-arterial PRN Ditty, Kevan Ny, MD        Allergies  Allergen Reactions  . Accupril [Quinapril Hcl] Swelling    MOUTH SWELLING    Past Medical History:  Diagnosis Date  . Arthritis    "knees" (05/10/2016)  . CAD S/P percutaneous coronary angioplasty 2011   a. 2011: s/p PCI to PDA with Endeavor 2.5 mm x 12 mm DES - [place] [facility] b. low-risk NST in 07/2015    . Cancer of kidney ([place])   . Chronic kidney disease   . Daily headache    "recently" (05/10/2016)  . Dyslipidemia, goal LDL below 70   . Glaucoma   . Gout   . History of kidney stones   . Hypertension, essential   . Obesity, Class II, BMI 35-39.9, with comorbidity    BMI 37  . OSA on CPAP   . PONV (postoperative nausea and vomiting)    after colonoscopy  . Prostate cancer ([place]) 2007  . Rash since 04-08-16   on neck healing, right arm improving  . Thoracic aortic aneurysm without rupture ([place]) - Noted on chest CT. 4.5 cm 02/15/2016   4.5 cm per dr Cyndia Bent note and [facility] ct 01-18-16  . Type II diabetes mellitus ([place])     Social History   Socioeconomic History  . Marital status: Married    Spouse name: Not on file  . Number of children: Not on file  . Years of education: Not on file  . Highest education level: Not on file  Occupational History  . Not on file  Social Needs  . Financial resource strain: Not on file  . Food insecurity:    Worry: Not on file    Inability: Not on file  . Transportation needs:    Medical: Not on file    Non-medical: Not on file  Tobacco Use  . Smoking status: Former Smoker    Types: Cigarettes    Last attempt to quit: 06/04/1993    Years since quitting: 24.8  . Smokeless tobacco: Never Used  . Tobacco comment: A PK WOULD LAST A WEEK  Substance and Sexual Activity  . Alcohol use: Yes    Alcohol/week: 1.2 oz    Types: 1 Glasses of wine, 1 Cans of beer per week    Comment: rare  . Drug use: No  . Sexual activity: Not Currently  Lifestyle  . Physical activity:    Days per week: Not on file    Minutes per session: Not on file  . Stress: Not on file  Relationships  . Social connections:    Talks on phone: Not on file    Gets together: Not on file    Attends religious service: Not on file    Active member of club or organization: Not on file    Attends meetings of clubs or organizations: Not on file    Relationship status: Not on  file  . Intimate partner violence:  Fear of current or ex partner: Not on file    Emotionally abused: Not on file    Physically abused: Not on file    Forced sexual activity: Not on file  Other Topics Concern  . Not on file  Social History Narrative   Married father of 3. Had been walking up to 2 miles every other day appetite is good his weight for about half an hour time period, but limited now due to back pain. Occasional alcohol. No tobacco products -- he quit about 20-30 years ago.     Family History  Problem Relation Age of Onset  . Hypertension Son      Review of Systems: General: negative for chills, fever, night sweats or weight changes.  Cardiovascular: negative for dyspnea on exertion, edema, orthopnea, palpitations, paroxysmal nocturnal dyspnea or shortness of breath Dermatological: negative for rash Respiratory: negative for cough or wheezing Urologic: negative for hematuria Abdominal: negative for nausea, vomiting, diarrhea, bright red blood per rectum, melena, or hematemesis Neurologic: negative for visual changes, syncope, or dizziness All other systems reviewed and are otherwise negative except as noted above.    Blood pressure 128/68, pulse 74, height 6' (1.829 m), weight 266 lb 9.6 oz (120.9 kg).  General appearance: alert, cooperative, no distress and moderately obese Neck: no carotid bruit and no JVD Lungs: clear to auscultation bilaterally Heart: regular rate and rhythm Extremities: extremities normal, atraumatic, no cyanosis or edema Skin: Skin color, texture, turgor normal. No rashes or lesions Neurologic: Grossly normal  EKG NSR  ASSESSMENT AND PLAN:   Encounter for pre-operative cardiovascular clearance Pt to have a colonoscopy and cataract surgery- he is on Brilinta  Chest pain with low risk for cardiac etiology Atypical chest pain. Would consider Myoview if this recurs. Rx for SL NTG provided  Kidney disease, chronic, stage III (GFR 30-59  ml/min) (HCC) Renal cell cancer-s/p nephrectomy  CAD S/P percutaneous coronary angioplasty PCI of PDA Endeavor 2.5 mm x 12 mm DES - [place] 2011 Myoview low risk July 2018  Thoracic aortic aneurysm without rupture [facility]) - Noted on chest CT. 4.5 cm Stable 4.5 cm ascending thoracic aortic aneurysm May 2018   PLAN  Reviewed with dr Oval Linsey in the office today. We let Dr Benson Norway know the patient is on Brilinta, his colonoscopy may need to be rescheduled. He is cleared for cataract surgery and colonoscopy from our standpoint, Brilinta is not usually held for this but his cataract surgery will be include trapeculectomy and Dr Danelle Earthly has requested guidance as well on holding Brilinta.  I will check with Dr Ellyn Hack about holding this.   Kerin Ransom PA-C 04/22/2018 3:12 PM   Adden: Discussed with Dr Horatio Pel to hold Brilinta as needed, usually 5 days, pre colonoscopy and eye surgery. Resume post op ASAP.   Kerin Ransom PA-C 04/23/2018 8:07 AM

## 2018-04-22 NOTE — Patient Instructions (Signed)
Medication Instructions:  Your physician recommends that you continue on your current medications as directed. Please refer to the Current Medication list given to you today.  --we will call you with directions for your Brilinta in preparation for your colonoscopy  Follow-Up: 3 months with Dr. Ellyn Hack  Any Other Special Instructions Will Be Listed Below (If Applicable).  Please call us if you have recurrent chest pain   If you need a refill on your cardiac medications before your next appointment, please call your pharmacy.

## 2018-04-22 NOTE — Assessment & Plan Note (Signed)
Stable 4.5 cm ascending thoracic aortic aneurysm May 2018

## 2018-04-22 NOTE — Assessment & Plan Note (Signed)
Renal cell cancer-s/p nephrectomy

## 2018-04-22 NOTE — Assessment & Plan Note (Signed)
PCI of PDA Endeavor 2.5 mm x 12 mm DES - [place] [facility] 2011 Myoview low risk July 2018

## 2018-04-23 ENCOUNTER — Telehealth: Payer: Self-pay | Admitting: *Deleted

## 2018-04-23 NOTE — Addendum Note (Signed)
Addended by: Zebedee Iba on: 04/23/2018 03:08 PM   Modules accepted: Orders

## 2018-04-23 NOTE — Telephone Encounter (Signed)
   Primary Cardiologist: Glenetta Hew, MD  Chart reviewed as part of pre-operative protocol coverage. Given past medical history and pt seen by Kerin Ransom, PA and discussed with Dr. Ellyn Hack yesterday, based on ACC/AHA guidelines, Bailee Thall would be at acceptable risk for the planned procedure without further cardiovascular testing.   Per Dr. Ellyn Hack, [place] to hold Brilinta as needed, usually 5 days, pre colonoscopy and eye surgery. Resume post op ASAP.   I will route this recommendation to the requesting party via Epic fax function and remove from pre-op pool.  Please call with questions.  Daune Perch, NP 04/23/2018, 2:23 PM

## 2018-04-23 NOTE — Telephone Encounter (Signed)
   [facility] Pre-operative Risk Assessment    Request for surgical clearance:  1. What type of surgery is being performed? COLONOSCOPY   2. When is this surgery scheduled? 04-30-18  3. What type of clearance is required (medical clearance vs. Pharmacy clearance to hold med vs. Both)? BOTH  4. Are there any medications that need to be held prior to surgery and how long?  BRILINTA  5. Practice name and name of physician performing surgery? [facility]  6. What is your office phone number [phone]   7.   What is your office fax [phone]   8.   Anesthesia type (None, local, MAC, general) ? PROPOFOL   Devra Dopp 04/23/2018, 11:10 AM  _________________________________________________________________   (provider comments below)

## 2018-04-30 DIAGNOSIS — Z86010 Personal history of colonic polyps: Secondary | ICD-10-CM | POA: Diagnosis not present

## 2018-04-30 DIAGNOSIS — K635 Polyp of colon: Secondary | ICD-10-CM | POA: Diagnosis not present

## 2018-04-30 DIAGNOSIS — D122 Benign neoplasm of ascending colon: Secondary | ICD-10-CM | POA: Diagnosis not present

## 2018-04-30 DIAGNOSIS — D123 Benign neoplasm of transverse colon: Secondary | ICD-10-CM | POA: Diagnosis not present

## 2018-04-30 DIAGNOSIS — K5730 Diverticulosis of large intestine without perforation or abscess without bleeding: Secondary | ICD-10-CM | POA: Diagnosis not present

## 2018-05-27 DIAGNOSIS — F329 Major depressive disorder, single episode, unspecified: Secondary | ICD-10-CM | POA: Diagnosis not present

## 2018-05-27 DIAGNOSIS — S139XXA Sprain of joints and ligaments of unspecified parts of neck, initial encounter: Secondary | ICD-10-CM | POA: Diagnosis not present

## 2018-05-27 DIAGNOSIS — M542 Cervicalgia: Secondary | ICD-10-CM | POA: Diagnosis not present

## 2018-05-27 DIAGNOSIS — I10 Essential (primary) hypertension: Secondary | ICD-10-CM | POA: Diagnosis not present

## 2018-05-30 DIAGNOSIS — M4712 Other spondylosis with myelopathy, cervical region: Secondary | ICD-10-CM | POA: Diagnosis not present

## 2018-05-30 DIAGNOSIS — Z6835 Body mass index (BMI) 35.0-35.9, adult: Secondary | ICD-10-CM | POA: Diagnosis not present

## 2018-05-30 DIAGNOSIS — I10 Essential (primary) hypertension: Secondary | ICD-10-CM | POA: Diagnosis not present

## 2018-06-14 DIAGNOSIS — R0602 Shortness of breath: Secondary | ICD-10-CM | POA: Diagnosis not present

## 2018-06-14 DIAGNOSIS — K8020 Calculus of gallbladder without cholecystitis without obstruction: Secondary | ICD-10-CM | POA: Diagnosis not present

## 2018-06-14 DIAGNOSIS — C642 Malignant neoplasm of left kidney, except renal pelvis: Secondary | ICD-10-CM | POA: Diagnosis not present

## 2018-06-19 DIAGNOSIS — M5412 Radiculopathy, cervical region: Secondary | ICD-10-CM | POA: Diagnosis not present

## 2018-06-19 DIAGNOSIS — R293 Abnormal posture: Secondary | ICD-10-CM | POA: Diagnosis not present

## 2018-06-19 DIAGNOSIS — M542 Cervicalgia: Secondary | ICD-10-CM | POA: Diagnosis not present

## 2018-06-19 DIAGNOSIS — M2560 Stiffness of unspecified joint, not elsewhere classified: Secondary | ICD-10-CM | POA: Diagnosis not present

## 2018-06-19 DIAGNOSIS — M160 Bilateral primary osteoarthritis of hip: Secondary | ICD-10-CM | POA: Diagnosis not present

## 2018-06-21 DIAGNOSIS — M2560 Stiffness of unspecified joint, not elsewhere classified: Secondary | ICD-10-CM | POA: Diagnosis not present

## 2018-06-21 DIAGNOSIS — C642 Malignant neoplasm of left kidney, except renal pelvis: Secondary | ICD-10-CM | POA: Diagnosis not present

## 2018-06-21 DIAGNOSIS — R338 Other retention of urine: Secondary | ICD-10-CM | POA: Diagnosis not present

## 2018-06-21 DIAGNOSIS — Z8546 Personal history of malignant neoplasm of prostate: Secondary | ICD-10-CM | POA: Diagnosis not present

## 2018-06-21 DIAGNOSIS — R293 Abnormal posture: Secondary | ICD-10-CM | POA: Diagnosis not present

## 2018-06-21 DIAGNOSIS — M542 Cervicalgia: Secondary | ICD-10-CM | POA: Diagnosis not present

## 2018-06-21 DIAGNOSIS — M5412 Radiculopathy, cervical region: Secondary | ICD-10-CM | POA: Diagnosis not present

## 2018-06-24 DIAGNOSIS — M2560 Stiffness of unspecified joint, not elsewhere classified: Secondary | ICD-10-CM | POA: Diagnosis not present

## 2018-06-24 DIAGNOSIS — M5412 Radiculopathy, cervical region: Secondary | ICD-10-CM | POA: Diagnosis not present

## 2018-06-24 DIAGNOSIS — R293 Abnormal posture: Secondary | ICD-10-CM | POA: Diagnosis not present

## 2018-06-24 DIAGNOSIS — M542 Cervicalgia: Secondary | ICD-10-CM | POA: Diagnosis not present

## 2018-06-26 DIAGNOSIS — M5412 Radiculopathy, cervical region: Secondary | ICD-10-CM | POA: Diagnosis not present

## 2018-06-26 DIAGNOSIS — M2560 Stiffness of unspecified joint, not elsewhere classified: Secondary | ICD-10-CM | POA: Diagnosis not present

## 2018-06-26 DIAGNOSIS — M542 Cervicalgia: Secondary | ICD-10-CM | POA: Diagnosis not present

## 2018-06-26 DIAGNOSIS — R293 Abnormal posture: Secondary | ICD-10-CM | POA: Diagnosis not present

## 2018-06-28 DIAGNOSIS — M2560 Stiffness of unspecified joint, not elsewhere classified: Secondary | ICD-10-CM | POA: Diagnosis not present

## 2018-06-28 DIAGNOSIS — M542 Cervicalgia: Secondary | ICD-10-CM | POA: Diagnosis not present

## 2018-06-28 DIAGNOSIS — M5412 Radiculopathy, cervical region: Secondary | ICD-10-CM | POA: Diagnosis not present

## 2018-06-28 DIAGNOSIS — R293 Abnormal posture: Secondary | ICD-10-CM | POA: Diagnosis not present

## 2018-07-01 DIAGNOSIS — M5412 Radiculopathy, cervical region: Secondary | ICD-10-CM | POA: Diagnosis not present

## 2018-07-01 DIAGNOSIS — M542 Cervicalgia: Secondary | ICD-10-CM | POA: Diagnosis not present

## 2018-07-01 DIAGNOSIS — M2560 Stiffness of unspecified joint, not elsewhere classified: Secondary | ICD-10-CM | POA: Diagnosis not present

## 2018-07-01 DIAGNOSIS — R293 Abnormal posture: Secondary | ICD-10-CM | POA: Diagnosis not present

## 2018-07-03 DIAGNOSIS — M5412 Radiculopathy, cervical region: Secondary | ICD-10-CM | POA: Diagnosis not present

## 2018-07-03 DIAGNOSIS — R293 Abnormal posture: Secondary | ICD-10-CM | POA: Diagnosis not present

## 2018-07-03 DIAGNOSIS — M542 Cervicalgia: Secondary | ICD-10-CM | POA: Diagnosis not present

## 2018-07-03 DIAGNOSIS — M2560 Stiffness of unspecified joint, not elsewhere classified: Secondary | ICD-10-CM | POA: Diagnosis not present

## 2018-07-05 DIAGNOSIS — M2560 Stiffness of unspecified joint, not elsewhere classified: Secondary | ICD-10-CM | POA: Diagnosis not present

## 2018-07-05 DIAGNOSIS — R293 Abnormal posture: Secondary | ICD-10-CM | POA: Diagnosis not present

## 2018-07-05 DIAGNOSIS — M5412 Radiculopathy, cervical region: Secondary | ICD-10-CM | POA: Diagnosis not present

## 2018-07-05 DIAGNOSIS — M542 Cervicalgia: Secondary | ICD-10-CM | POA: Diagnosis not present

## 2018-07-09 DIAGNOSIS — M542 Cervicalgia: Secondary | ICD-10-CM | POA: Diagnosis not present

## 2018-07-09 DIAGNOSIS — R293 Abnormal posture: Secondary | ICD-10-CM | POA: Diagnosis not present

## 2018-07-09 DIAGNOSIS — M5412 Radiculopathy, cervical region: Secondary | ICD-10-CM | POA: Diagnosis not present

## 2018-07-09 DIAGNOSIS — M2560 Stiffness of unspecified joint, not elsewhere classified: Secondary | ICD-10-CM | POA: Diagnosis not present

## 2018-07-10 DIAGNOSIS — M2560 Stiffness of unspecified joint, not elsewhere classified: Secondary | ICD-10-CM | POA: Diagnosis not present

## 2018-07-10 DIAGNOSIS — M5412 Radiculopathy, cervical region: Secondary | ICD-10-CM | POA: Diagnosis not present

## 2018-07-10 DIAGNOSIS — R293 Abnormal posture: Secondary | ICD-10-CM | POA: Diagnosis not present

## 2018-07-10 DIAGNOSIS — M542 Cervicalgia: Secondary | ICD-10-CM | POA: Diagnosis not present

## 2018-07-12 DIAGNOSIS — M5412 Radiculopathy, cervical region: Secondary | ICD-10-CM | POA: Diagnosis not present

## 2018-07-12 DIAGNOSIS — M2560 Stiffness of unspecified joint, not elsewhere classified: Secondary | ICD-10-CM | POA: Diagnosis not present

## 2018-07-12 DIAGNOSIS — M542 Cervicalgia: Secondary | ICD-10-CM | POA: Diagnosis not present

## 2018-07-12 DIAGNOSIS — R293 Abnormal posture: Secondary | ICD-10-CM | POA: Diagnosis not present

## 2018-07-16 DIAGNOSIS — M2560 Stiffness of unspecified joint, not elsewhere classified: Secondary | ICD-10-CM | POA: Diagnosis not present

## 2018-07-16 DIAGNOSIS — R293 Abnormal posture: Secondary | ICD-10-CM | POA: Diagnosis not present

## 2018-07-16 DIAGNOSIS — M5412 Radiculopathy, cervical region: Secondary | ICD-10-CM | POA: Diagnosis not present

## 2018-07-16 DIAGNOSIS — M542 Cervicalgia: Secondary | ICD-10-CM | POA: Diagnosis not present

## 2018-07-18 DIAGNOSIS — M542 Cervicalgia: Secondary | ICD-10-CM | POA: Diagnosis not present

## 2018-07-18 DIAGNOSIS — M5412 Radiculopathy, cervical region: Secondary | ICD-10-CM | POA: Diagnosis not present

## 2018-07-18 DIAGNOSIS — M2560 Stiffness of unspecified joint, not elsewhere classified: Secondary | ICD-10-CM | POA: Diagnosis not present

## 2018-07-18 DIAGNOSIS — R293 Abnormal posture: Secondary | ICD-10-CM | POA: Diagnosis not present

## 2018-07-23 DIAGNOSIS — M5412 Radiculopathy, cervical region: Secondary | ICD-10-CM | POA: Diagnosis not present

## 2018-07-23 DIAGNOSIS — M542 Cervicalgia: Secondary | ICD-10-CM | POA: Diagnosis not present

## 2018-07-23 DIAGNOSIS — R293 Abnormal posture: Secondary | ICD-10-CM | POA: Diagnosis not present

## 2018-07-23 DIAGNOSIS — M2560 Stiffness of unspecified joint, not elsewhere classified: Secondary | ICD-10-CM | POA: Diagnosis not present

## 2018-07-25 DIAGNOSIS — M2560 Stiffness of unspecified joint, not elsewhere classified: Secondary | ICD-10-CM | POA: Diagnosis not present

## 2018-07-25 DIAGNOSIS — M5412 Radiculopathy, cervical region: Secondary | ICD-10-CM | POA: Diagnosis not present

## 2018-07-25 DIAGNOSIS — M542 Cervicalgia: Secondary | ICD-10-CM | POA: Diagnosis not present

## 2018-07-25 DIAGNOSIS — R293 Abnormal posture: Secondary | ICD-10-CM | POA: Diagnosis not present

## 2018-07-26 DIAGNOSIS — Z23 Encounter for immunization: Secondary | ICD-10-CM | POA: Diagnosis not present

## 2018-07-29 ENCOUNTER — Other Ambulatory Visit: Payer: Self-pay | Admitting: Cardiology

## 2018-07-30 DIAGNOSIS — R293 Abnormal posture: Secondary | ICD-10-CM | POA: Diagnosis not present

## 2018-07-30 DIAGNOSIS — M542 Cervicalgia: Secondary | ICD-10-CM | POA: Diagnosis not present

## 2018-07-30 DIAGNOSIS — M5412 Radiculopathy, cervical region: Secondary | ICD-10-CM | POA: Diagnosis not present

## 2018-07-30 DIAGNOSIS — M2560 Stiffness of unspecified joint, not elsewhere classified: Secondary | ICD-10-CM | POA: Diagnosis not present

## 2018-08-01 DIAGNOSIS — M5412 Radiculopathy, cervical region: Secondary | ICD-10-CM | POA: Diagnosis not present

## 2018-08-01 DIAGNOSIS — M2560 Stiffness of unspecified joint, not elsewhere classified: Secondary | ICD-10-CM | POA: Diagnosis not present

## 2018-08-01 DIAGNOSIS — R293 Abnormal posture: Secondary | ICD-10-CM | POA: Diagnosis not present

## 2018-08-01 DIAGNOSIS — M542 Cervicalgia: Secondary | ICD-10-CM | POA: Diagnosis not present

## 2018-08-05 ENCOUNTER — Ambulatory Visit (INDEPENDENT_AMBULATORY_CARE_PROVIDER_SITE_OTHER): Payer: Medicare Other | Admitting: Cardiology

## 2018-08-05 ENCOUNTER — Encounter: Payer: Self-pay | Admitting: Cardiology

## 2018-08-05 VITALS — BP 126/73 | HR 79 | Ht 72.0 in | Wt 272.0 lb

## 2018-08-05 DIAGNOSIS — Z9861 Coronary angioplasty status: Secondary | ICD-10-CM | POA: Diagnosis not present

## 2018-08-05 DIAGNOSIS — I712 Thoracic aortic aneurysm, without rupture, unspecified: Secondary | ICD-10-CM

## 2018-08-05 DIAGNOSIS — E785 Hyperlipidemia, unspecified: Secondary | ICD-10-CM

## 2018-08-05 DIAGNOSIS — N529 Male erectile dysfunction, unspecified: Secondary | ICD-10-CM

## 2018-08-05 DIAGNOSIS — I2510 Atherosclerotic heart disease of native coronary artery without angina pectoris: Secondary | ICD-10-CM | POA: Diagnosis not present

## 2018-08-05 DIAGNOSIS — E669 Obesity, unspecified: Secondary | ICD-10-CM

## 2018-08-05 DIAGNOSIS — I119 Hypertensive heart disease without heart failure: Secondary | ICD-10-CM | POA: Diagnosis not present

## 2018-08-05 DIAGNOSIS — G4733 Obstructive sleep apnea (adult) (pediatric): Secondary | ICD-10-CM | POA: Diagnosis not present

## 2018-08-05 DIAGNOSIS — N183 Chronic kidney disease, stage 3 unspecified: Secondary | ICD-10-CM

## 2018-08-05 DIAGNOSIS — Z9989 Dependence on other enabling machines and devices: Secondary | ICD-10-CM

## 2018-08-05 DIAGNOSIS — R079 Chest pain, unspecified: Secondary | ICD-10-CM | POA: Diagnosis not present

## 2018-08-05 MED ORDER — SILDENAFIL CITRATE 25 MG PO TABS: ORAL_TABLET | ORAL | 11 refills | Status: DC

## 2018-08-05 MED ORDER — ASPIRIN EC 81 MG PO TBEC: 81.0000 mg | DELAYED_RELEASE_TABLET | Freq: Every day | ORAL | 3 refills | Status: DC

## 2018-08-05 NOTE — Assessment & Plan Note (Addendum)
No active angina symptoms.  I do not think the chest pain he is having his anginal in nature.  Since his symptoms are not really significant I do not think we need to do a stress test. Remains on combination ARB-calcium channel blocker along with Vytorin.    He was previously on Toprol 100 mg daily and I do not see that listed here now.  Not 100% sure why this was stopped.  However since his blood pressure seems to be stable, will keep it off for now, but low threshold for restarting low-dose.  My suspicion is that was discontinued due to fatigue.

## 2018-08-05 NOTE — Assessment & Plan Note (Signed)
Relatively stable by recent evaluation.  Followed by Dr. Arvid Right. Continue blood pressure control and lipid management.

## 2018-08-05 NOTE — Assessment & Plan Note (Signed)
Unfortunately, he seems to be putting on weight now that his knee is bothering him some.  Discussed importance of staying active and working to lose weight.  Also needs to cut down dietary intake/be smarter with his dietary intake

## 2018-08-05 NOTE — Patient Instructions (Addendum)
MEDICATION INSTRUCTIONS  MAY USE SILDENAFIL  ,BUT CAN NOT USE NITROGLYCERIN 24 HOURS PRIOR OR 24 HOURS AFTER USE.    OTHER CHANGES-- START ASPIRIN 81 MG  ONE TABLET DAILY AFTER  COMPLETING BRILINTA 60 MG       Your physician wants you to follow-up in [place]. You will receive a reminder letter in the mail two months in advance. If you don't receive a letter, please call our office to schedule the follow-up appointment.    If you need a refill on your cardiac medications before your next appointment, please call your pharmacy.

## 2018-08-05 NOTE — Assessment & Plan Note (Signed)
For atypical sounding chest discomfort.  I do not think that we really need to go down the road of stress test, especially since he is not having anymore.

## 2018-08-05 NOTE — Progress Notes (Signed)
PCP: Merrilee Seashore, MD  Clinic Note: Chief Complaint  Patient presents with  . Follow-up    3 months  . Coronary Artery Disease    no angina  . Edema    in ankles, minimal  . Shortness of Breath    With exertion    HPI: Jacob Berger is a 72 y.o. male with a PMH of known CAD-PCI-RCA as well as Hypertensive Heart Disease & Thoracic Aortic Aneurysm (follwed by Dr. Cyndia Bent) along with HLD, OSA-CPAP, DM-2 & CKD (s/p L Nephrectomy for RCCA) below who presents today for ~annual follow-up  I saw him last on 06/06/2018 for hospital f/u -- CP evaluated with Myoview - LOW RISK.  Jacob Berger was last seen on April 22, 2018 by Kerin Ransom, [place].  At that time he was noted some exertional dyspnea and chest discomfort (I mention it to Dr. Cyndia Bent normal follow-up).  Was noticing it when he first started going in the morning.  Not at rest.  Symptoms went on for about a week. -->  Was cleared for colonoscopy.  Felt that we can check a stress test if symptoms recurred.  Recent Hospitalizations:   January 2019: C 4, 5-6 anterior discectomy with fusion and plate fixation  Root Canal ~ 1-2 weeks ago (on Abx)   Studies Personally Reviewed - (if available, images/films reviewed: From Epic Chart or Care Everywhere)  None  Interval History: Jacob Berger returns here today overall feeling much better.  He has had some intermittent episodes of upper chest pain that usually happens at rest, and not with exertion.  He worked out of the gym before having an episode of chest pain last night, no pain while exerting himself at the gym, but a couple hours later he had a aching sensation up in the left upper chest.  This is not frequent, and not associated with any particular activity, usually happens at rest.  He has not been able to be as active as he would like to be because his left knee is still bothering him and causing the pain.  Besides the episodes of chest discomfort noted above, he otherwise  has not had any exertional chest pain.  He does get short of breath if he overexerts, because he notably is a little bit deconditioned.  No PND, orthopnea or significant edema.  He has swelling in the knee but not edema.   No palpitations, lightheadedness, dizziness, weakness or syncope/near syncope. No TIA/amaurosis fugax symptoms. No claudication.  ROS: A comprehensive was performed. Review of Systems  Constitutional: Negative for malaise/fatigue.  HENT: Negative for congestion and nosebleeds.   Respiratory: Positive for shortness of breath (With overexertion).   Cardiovascular: Positive for chest pain (Noted in HPI). Negative for leg swelling.  Gastrointestinal: Negative for blood in stool and melena.  Genitourinary: Negative for hematuria.  Musculoskeletal: Positive for joint pain (Left knee pain and swelling).  Neurological: Positive for tingling (He does have some tingling on the outside of his left hand up his forearm.  This is been since his C-spine surgery.). Negative for dizziness, focal weakness and weakness.  Endo/Heme/Allergies: Does not bruise/bleed easily.  Psychiatric/Behavioral: Negative.  Negative for memory loss. The patient is not nervous/anxious.   All other systems reviewed and are negative.   I have reviewed and (if needed) personally updated the patient's problem list, medications, allergies, past medical and surgical history, social and family history.   Past Medical History:  Diagnosis Date  . Arthritis    "knees" (  05/10/2016)  . CAD S/P percutaneous coronary angioplasty 2011   a. 2011: s/p PCI to PDA with Endeavor 2.5 mm x 12 mm DES - [place] b. low-risk NST in 07/2015  . Cancer of kidney ([place])   . Chronic kidney disease   . Daily headache    "recently" (05/10/2016)  . Dyslipidemia, goal LDL below 70   . Glaucoma   . Gout   . History of kidney stones   . Hypertension, essential   . Obesity, Class II, BMI 35-39.9, with comorbidity     BMI 37  . OSA on CPAP   . PONV (postoperative nausea and vomiting)    after colonoscopy  . Prostate cancer ([place]) 2007  . Rash since 04-08-16   on neck healing, right arm improving  . Thoracic aortic aneurysm without rupture ([place]) - Noted on chest CT. 4.5 cm 02/15/2016   4.5 cm per dr Cyndia Bent note and [facility] ct 01-18-16  . Type II diabetes mellitus ([place])     Past Surgical History:  Procedure Laterality Date  . ANTERIOR CERVICAL DECOMP/DISCECTOMY FUSION N/A 11/09/2017   Procedure: Cervical four-five, Cervical five-six Anterior discectomy with fusion and plate fixation;  Surgeon: Ditty, Kevan Ny, MD;  Location: [facility];  Service: Neurosurgery;  Laterality: N/A;  . COLONOSCOPY W/ BIOPSIES AND POLYPECTOMY  "several"  . CORONARY ANGIOPLASTY WITH STENT PLACEMENT  2001   [place]: Endeavor 2.5 mm x 12 mm DES - distal PDA ([facility])  . LAPAROSCOPIC NEPHRECTOMY Left 04/13/2016   Procedure: LAPAROSCOPIC RADICAL LEFT NEPHRECTOMY;  Surgeon: Raynelle Bring, MD;  Location: [facility];  Service: Urology;  Laterality: Left;  . NM MYOVIEW ([place] H[facility])  6/'13, 3/'16   a. Treadmill Myoview: 10 minutes, 12 METS; no ischemia infarction, EF 65%; b. [facility]: LOW RISK. EF 57%. 10.4 METS low risk scan no ischemia. No wall motion abnormality.   Marland Kitchen NM MYOVIEW LTD  05/2017   EF 50%. Normal function. No ischemia or infarction.  Marland Kitchen PROSTATECTOMY    . REFRACTIVE SURGERY Left     Current Meds  Medication Sig  . allopurinol (ZYLOPRIM) 300 MG tablet Take 300 mg by mouth daily.   . AZOR 10-40 MG per tablet TAKE 1 TABLET BY MOUTH DAILY  . bimatoprost (LUMIGAN) 0.01 % SOLN Place 1 drop into both eyes at bedtime.  . COD LIVER OIL PO Take 1 capsule by mouth daily.  . Coenzyme Q10 (CO Q-10) 100 MG CAPS Take 1 capsule by mouth 2 (two) times a week.   . Dulaglutide (TRULICITY) 1.5 DX/8.3JA SOPN Inject 1.5 mg into the skin every Thursday. In the afternoon 2pm  . fenofibrate (TRICOR) 145 MG tablet Take 145 mg by  mouth daily at 2 PM.   . furosemide (LASIX) 80 MG tablet Take 20-40 mg by mouth daily as needed (for fluid).  Ernest Mallick FLEXTOUCH 100 UNIT/ML Pen Inject 40 Units into the skin at bedtime.   . Methylcellulose, Laxative, (CITRUCEL) 500 MG TABS Take 500 mg by mouth daily.   . nitroGLYCERIN (NITROSTAT) 0.4 MG SL tablet Place 1 tablet (0.4 mg total) under the tongue every 5 (five) minutes as needed for chest pain.  Marland Kitchen NOVOLOG FLEXPEN 100 UNIT/ML FlexPen Inject 15 Units into the skin 3 (three) times daily before meals.   . timolol (TIMOPTIC) 0.5 % ophthalmic solution Place 1 drop into both eyes 2 (two) times daily. Morning and afternoon  . VYTORIN 10-40 MG per tablet Take 1 tablet by  mouth at bedtime.   . [DISCONTINUED] BRILINTA 60 MG TABS tablet TAKE 1 TABLET TWICE A DAY  . [DISCONTINUED] docusate sodium (COLACE) 250 MG capsule Take 1 capsule (250 mg total) by mouth daily.  . [DISCONTINUED] ticagrelor (BRILINTA) 60 MG TABS tablet Take 60 mg by mouth 2 (two) times daily.    Allergies  Allergen Reactions  . Accupril [Quinapril Hcl] Swelling    MOUTH SWELLING    Social History   Tobacco Use  . Smoking status: Former Smoker    Types: Cigarettes    Last attempt to quit: 06/04/1993    Years since quitting: 25.1  . Smokeless tobacco: Never Used  . Tobacco comment: A PK WOULD LAST A WEEK  Substance Use Topics  . Alcohol use: Yes    Alcohol/week: 2.0 standard drinks    Types: 1 Glasses of wine, 1 Cans of beer per week    Comment: rare  . Drug use: No   Social History   Social History Narrative   Married father of 3. Had been walking up to 2 miles every other day appetite is good his weight for about half an hour time period, but limited now due to back pain. Occasional alcohol. No tobacco products -- he quit about 20-30 years ago.    family history includes Hypertension in his son.  Wt Readings from Last 3 Encounters:  08/05/18 272 lb (123.4 kg)  04/22/18 266 lb 9.6 oz (120.9 kg)    04/03/18 266 lb 3.2 oz (120.7 kg)    PHYSICAL EXAM BP 126/73   Pulse 79   Ht 6' (1.829 m)   Wt 272 lb (123.4 kg)   BMI 36.89 kg/m  Physical Exam  Constitutional: He is oriented to person, place, and time. He appears well-developed and well-nourished. No distress.  HENT:  Head: Normocephalic and atraumatic.  Eyes: Conjunctivae and EOM are normal.  Neck: Normal range of motion. Neck supple. No hepatojugular reflux and no JVD present. Carotid bruit is not present.  Cardiovascular: Normal rate, regular rhythm, S1 normal, S2 normal, intact distal pulses and normal pulses.  No extrasystoles are present. PMI is not displaced (Difficult to palpate). Exam reveals distant heart sounds. Exam reveals no gallop and no friction rub.  Murmur heard.  Harsh crescendo-decrescendo early systolic murmur is present with a grade of 1/6 at the upper right sternal border radiating to the neck. Pulmonary/Chest: Effort normal and breath sounds normal. No respiratory distress. He has no wheezes.  Abdominal: Soft. Bowel sounds are normal. He exhibits no distension. There is no tenderness. There is no rebound.  Neurological: He is alert and oriented to person, place, and time. A cranial nerve deficit is present.  Psychiatric: He has a normal mood and affect. His behavior is normal. Judgment and thought content normal.  Vitals reviewed.    Adult ECG Report n/a  Other studies Reviewed: Additional studies/ records that were reviewed today include:  Recent Labs:   Lab Results  Component Value Date   CHOL 105 05/27/2017   HDL 25 (L) 05/27/2017   LDLCALC 35 05/27/2017   TRIG 224 (H) 05/27/2017   CHOLHDL 4.2 05/27/2017    - checked 01/2018 - TG 125, TG 154, HDL 29, LDL 65  ASSESSMENT / PLAN: Problem List Items Addressed This Visit    CAD S/P percutaneous coronary angioplasty (Chronic)    Distant history of DES PCI in 2011.  At this point I think is probably okay coming off of Brilinta.  When he finishes  his current bottle, he will simply stop Brilinta and go to baby aspirin.      Relevant Medications   sildenafil (VIAGRA) 25 MG tablet   aspirin EC 81 MG tablet   Chest pain with low risk for cardiac etiology    For atypical sounding chest discomfort.  I do not think that we really need to go down the road of stress test, especially since he is not having anymore.      Coronary artery disease involving native coronary artery of native heart without angina pectoris - Primary (Chronic)    No active angina symptoms.  I do not think the chest pain he is having his anginal in nature.  Since his symptoms are not really significant I do not think we need to do a stress test. Remains on combination ARB-calcium channel blocker along with Vytorin.    He was previously on Toprol 100 mg daily and I do not see that listed here now.  Not 100% sure why this was stopped.  However since his blood pressure seems to be stable, will keep it off for now, but low threshold for restarting low-dose.  My suspicion is that was discontinued due to fatigue.      Relevant Medications   sildenafil (VIAGRA) 25 MG tablet   aspirin EC 81 MG tablet   Dyslipidemia, goal LDL below 70 (Chronic)    Last set of labs show that his lipids are not as good as they were previously.  He is still on Vytorin.  Should be due for recheck soon.  May need to be a little bit more aggressive if HDL climbs.      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 No NTG 24 hr after or before.      Hypertensive heart disease without CHF (Chronic)    Blood pressure actually seems to be well controlled.  When I see him back, will need to reevaluate the reason for why the metoprolol was stopped.  He had been on Toprol 100 mg a year ago.  Would benefit from being on beta-blocker provided  that it does not affect his energy level.      Relevant Medications   sildenafil (VIAGRA) 25 MG tablet   aspirin EC 81 MG tablet   Kidney disease, chronic, stage III (GFR 30-59 ml/min) (HCC) (Chronic)    No issues at all as far as any decreased urine output. Continue blood pressure control and PRN furosemide for edema.      Obesity (BMI 30-39.9) (Chronic)    Unfortunately, he seems to be putting on weight now that his knee is bothering him some.  Discussed importance of staying active and working to lose weight.  Also needs to cut down dietary intake/be smarter with his dietary intake      OSA on CPAP (Chronic)   Thoracic aortic aneurysm without rupture ([place]) - Noted on chest CT. 4.5 cm (Chronic)    Relatively stable by recent evaluation.  Followed by Dr. Arvid Right. Continue blood pressure control and lipid management.      Relevant Medications   sildenafil (VIAGRA) 25 MG tablet   aspirin EC 81 MG tablet      I spent a total of 23minutes with the patient and chart review. >  50% of the time was  spent in direct patient consultation.   Current medicines are reviewed at length with the patient today.  (+/- concerns) Asked about ER. The following changes have been made:  see below   Patient Instructions  MEDICATION INSTRUCTIONS  MAY USE SILDENAFIL  ,BUT CAN NOT USE NITROGLYCERIN 24 HOURS PRIOR OR 24 HOURS AFTER USE.    OTHER CHANGES-- START ASPIRIN 81 MG  ONE TABLET DAILY AFTER  COMPLETING BRILINTA 60 MG       Your physician wants you to follow-up in [place] [facility]. You will receive a reminder letter in the mail two months in advance. If you don't receive a letter, please call our office to schedule the follow-up appointment.    If you need a refill on your cardiac medications before your next appointment, please call your pharmacy.     Studies Ordered:   No orders of the defined types were placed in this encounter.     Glenetta Hew, M.D.,  M.S. Interventional Cardiologist   Pager # [phone] Phone # [phone] [address] [place], [place] [address]   Thank you for choosing [facility]!!

## 2018-08-05 NOTE — Assessment & Plan Note (Signed)
Blood pressure actually seems to be well controlled.  When I see him back, will need to reevaluate the reason for why the metoprolol was stopped.  He had been on Toprol 100 mg a year ago.  Would benefit from being on beta-blocker provided that it does not affect his energy level.

## 2018-08-05 NOTE — Assessment & Plan Note (Signed)
Last set of labs show that his lipids are not as good as they were previously.  He is still on Vytorin.  Should be due for recheck soon.  May need to be a little bit more aggressive if HDL climbs.

## 2018-08-05 NOTE — Assessment & Plan Note (Signed)
Distant history of DES PCI in 2011.  At this point I think is probably okay coming off of Brilinta.  When he finishes his current bottle, he will simply stop Brilinta and go to baby aspirin.

## 2018-08-05 NOTE — Assessment & Plan Note (Addendum)
Will Rx trial run of Viagra(sildenafil) 25 mg PO (1-2 tabs) prn. No NTG 24 hr after or before.

## 2018-08-05 NOTE — Assessment & Plan Note (Signed)
No issues at all as far as any decreased urine output. Continue blood pressure control and PRN furosemide for edema.

## 2018-08-06 DIAGNOSIS — R293 Abnormal posture: Secondary | ICD-10-CM | POA: Diagnosis not present

## 2018-08-06 DIAGNOSIS — M5412 Radiculopathy, cervical region: Secondary | ICD-10-CM | POA: Diagnosis not present

## 2018-08-06 DIAGNOSIS — M2560 Stiffness of unspecified joint, not elsewhere classified: Secondary | ICD-10-CM | POA: Diagnosis not present

## 2018-08-06 DIAGNOSIS — M542 Cervicalgia: Secondary | ICD-10-CM | POA: Diagnosis not present

## 2018-08-08 DIAGNOSIS — M2560 Stiffness of unspecified joint, not elsewhere classified: Secondary | ICD-10-CM | POA: Diagnosis not present

## 2018-08-08 DIAGNOSIS — M542 Cervicalgia: Secondary | ICD-10-CM | POA: Diagnosis not present

## 2018-08-08 DIAGNOSIS — M5412 Radiculopathy, cervical region: Secondary | ICD-10-CM | POA: Diagnosis not present

## 2018-08-08 DIAGNOSIS — R293 Abnormal posture: Secondary | ICD-10-CM | POA: Diagnosis not present

## 2018-08-13 DIAGNOSIS — R293 Abnormal posture: Secondary | ICD-10-CM | POA: Diagnosis not present

## 2018-08-13 DIAGNOSIS — M5412 Radiculopathy, cervical region: Secondary | ICD-10-CM | POA: Diagnosis not present

## 2018-08-13 DIAGNOSIS — M542 Cervicalgia: Secondary | ICD-10-CM | POA: Diagnosis not present

## 2018-08-13 DIAGNOSIS — M2560 Stiffness of unspecified joint, not elsewhere classified: Secondary | ICD-10-CM | POA: Diagnosis not present

## 2018-08-16 DIAGNOSIS — I129 Hypertensive chronic kidney disease with stage 1 through stage 4 chronic kidney disease, or unspecified chronic kidney disease: Secondary | ICD-10-CM | POA: Diagnosis not present

## 2018-08-16 DIAGNOSIS — E118 Type 2 diabetes mellitus with unspecified complications: Secondary | ICD-10-CM | POA: Diagnosis not present

## 2018-08-16 DIAGNOSIS — I2510 Atherosclerotic heart disease of native coronary artery without angina pectoris: Secondary | ICD-10-CM | POA: Diagnosis not present

## 2018-08-16 DIAGNOSIS — M1A00X0 Idiopathic chronic gout, unspecified site, without tophus (tophi): Secondary | ICD-10-CM | POA: Diagnosis not present

## 2018-08-16 DIAGNOSIS — E789 Disorder of lipoprotein metabolism, unspecified: Secondary | ICD-10-CM | POA: Diagnosis not present

## 2018-08-23 DIAGNOSIS — I119 Hypertensive heart disease without heart failure: Secondary | ICD-10-CM | POA: Diagnosis not present

## 2018-08-23 DIAGNOSIS — E1121 Type 2 diabetes mellitus with diabetic nephropathy: Secondary | ICD-10-CM | POA: Diagnosis not present

## 2018-08-23 DIAGNOSIS — I2510 Atherosclerotic heart disease of native coronary artery without angina pectoris: Secondary | ICD-10-CM | POA: Diagnosis not present

## 2018-08-23 DIAGNOSIS — E1165 Type 2 diabetes mellitus with hyperglycemia: Secondary | ICD-10-CM | POA: Diagnosis not present

## 2018-08-23 DIAGNOSIS — G4733 Obstructive sleep apnea (adult) (pediatric): Secondary | ICD-10-CM | POA: Diagnosis not present

## 2018-09-03 DIAGNOSIS — M542 Cervicalgia: Secondary | ICD-10-CM | POA: Diagnosis not present

## 2018-09-03 DIAGNOSIS — M5412 Radiculopathy, cervical region: Secondary | ICD-10-CM | POA: Diagnosis not present

## 2018-09-03 DIAGNOSIS — R293 Abnormal posture: Secondary | ICD-10-CM | POA: Diagnosis not present

## 2018-09-03 DIAGNOSIS — M2560 Stiffness of unspecified joint, not elsewhere classified: Secondary | ICD-10-CM | POA: Diagnosis not present

## 2018-09-05 DIAGNOSIS — M542 Cervicalgia: Secondary | ICD-10-CM | POA: Diagnosis not present

## 2018-09-05 DIAGNOSIS — M5412 Radiculopathy, cervical region: Secondary | ICD-10-CM | POA: Diagnosis not present

## 2018-09-05 DIAGNOSIS — R293 Abnormal posture: Secondary | ICD-10-CM | POA: Diagnosis not present

## 2018-09-05 DIAGNOSIS — M2560 Stiffness of unspecified joint, not elsewhere classified: Secondary | ICD-10-CM | POA: Diagnosis not present

## 2018-09-10 DIAGNOSIS — R293 Abnormal posture: Secondary | ICD-10-CM | POA: Diagnosis not present

## 2018-09-10 DIAGNOSIS — M5412 Radiculopathy, cervical region: Secondary | ICD-10-CM | POA: Diagnosis not present

## 2018-09-10 DIAGNOSIS — M2560 Stiffness of unspecified joint, not elsewhere classified: Secondary | ICD-10-CM | POA: Diagnosis not present

## 2018-09-10 DIAGNOSIS — M542 Cervicalgia: Secondary | ICD-10-CM | POA: Diagnosis not present

## 2018-09-12 DIAGNOSIS — M5412 Radiculopathy, cervical region: Secondary | ICD-10-CM | POA: Diagnosis not present

## 2018-09-12 DIAGNOSIS — R293 Abnormal posture: Secondary | ICD-10-CM | POA: Diagnosis not present

## 2018-09-12 DIAGNOSIS — M2560 Stiffness of unspecified joint, not elsewhere classified: Secondary | ICD-10-CM | POA: Diagnosis not present

## 2018-09-12 DIAGNOSIS — M542 Cervicalgia: Secondary | ICD-10-CM | POA: Diagnosis not present

## 2018-09-30 DIAGNOSIS — M170 Bilateral primary osteoarthritis of knee: Secondary | ICD-10-CM | POA: Diagnosis not present

## 2018-10-07 DIAGNOSIS — I10 Essential (primary) hypertension: Secondary | ICD-10-CM | POA: Diagnosis not present

## 2018-10-07 DIAGNOSIS — M4722 Other spondylosis with radiculopathy, cervical region: Secondary | ICD-10-CM | POA: Diagnosis not present

## 2018-10-07 DIAGNOSIS — M4712 Other spondylosis with myelopathy, cervical region: Secondary | ICD-10-CM | POA: Diagnosis not present

## 2018-10-07 DIAGNOSIS — Z6837 Body mass index (BMI) 37.0-37.9, adult: Secondary | ICD-10-CM | POA: Diagnosis not present

## 2018-10-10 ENCOUNTER — Other Ambulatory Visit: Payer: Self-pay | Admitting: Neurosurgery

## 2018-10-10 DIAGNOSIS — M4712 Other spondylosis with myelopathy, cervical region: Secondary | ICD-10-CM

## 2018-10-22 ENCOUNTER — Other Ambulatory Visit: Payer: Medicare Other

## 2018-10-24 DIAGNOSIS — H2513 Age-related nuclear cataract, bilateral: Secondary | ICD-10-CM | POA: Diagnosis not present

## 2018-10-24 DIAGNOSIS — H401133 Primary open-angle glaucoma, bilateral, severe stage: Secondary | ICD-10-CM | POA: Diagnosis not present

## 2018-10-25 ENCOUNTER — Ambulatory Visit
Admission: RE | Admit: 2018-10-25 | Discharge: 2018-10-25 | Disposition: A | Discharge status: Home / Self Care | Payer: Medicare Other | Source: Ambulatory Visit | Attending: Neurosurgery | Admitting: Neurosurgery

## 2018-10-25 DIAGNOSIS — M4722 Other spondylosis with radiculopathy, cervical region: Secondary | ICD-10-CM | POA: Diagnosis not present

## 2018-10-25 DIAGNOSIS — M4712 Other spondylosis with myelopathy, cervical region: Secondary | ICD-10-CM

## 2018-10-25 DIAGNOSIS — M50122 Cervical disc disorder at C5-C6 level with radiculopathy: Secondary | ICD-10-CM | POA: Diagnosis not present

## 2018-11-08 DIAGNOSIS — H401131 Primary open-angle glaucoma, bilateral, mild stage: Secondary | ICD-10-CM | POA: Insufficient documentation

## 2018-11-08 DIAGNOSIS — H2513 Age-related nuclear cataract, bilateral: Secondary | ICD-10-CM | POA: Insufficient documentation

## 2018-11-12 ENCOUNTER — Other Ambulatory Visit: Payer: Self-pay

## 2018-11-12 ENCOUNTER — Encounter (HOSPITAL_COMMUNITY): Payer: Self-pay | Admitting: *Deleted

## 2018-11-12 ENCOUNTER — Emergency Department (HOSPITAL_COMMUNITY)
Admission: EM | Admit: 2018-11-12 | Discharge: 2018-11-12 | Disposition: A | Discharge status: Home / Self Care | Payer: Medicare Other | Attending: Emergency Medicine | Admitting: Emergency Medicine

## 2018-11-12 DIAGNOSIS — E1122 Type 2 diabetes mellitus with diabetic chronic kidney disease: Secondary | ICD-10-CM | POA: Diagnosis not present

## 2018-11-12 DIAGNOSIS — I129 Hypertensive chronic kidney disease with stage 1 through stage 4 chronic kidney disease, or unspecified chronic kidney disease: Secondary | ICD-10-CM | POA: Insufficient documentation

## 2018-11-12 DIAGNOSIS — L03211 Cellulitis of face: Secondary | ICD-10-CM | POA: Insufficient documentation

## 2018-11-12 DIAGNOSIS — Z87891 Personal history of nicotine dependence: Secondary | ICD-10-CM | POA: Diagnosis not present

## 2018-11-12 DIAGNOSIS — Z7982 Long term (current) use of aspirin: Secondary | ICD-10-CM | POA: Diagnosis not present

## 2018-11-12 DIAGNOSIS — Z794 Long term (current) use of insulin: Secondary | ICD-10-CM | POA: Insufficient documentation

## 2018-11-12 DIAGNOSIS — Z85528 Personal history of other malignant neoplasm of kidney: Secondary | ICD-10-CM | POA: Diagnosis not present

## 2018-11-12 DIAGNOSIS — R21 Rash and other nonspecific skin eruption: Secondary | ICD-10-CM | POA: Diagnosis present

## 2018-11-12 DIAGNOSIS — N183 Chronic kidney disease, stage 3 (moderate): Secondary | ICD-10-CM | POA: Diagnosis not present

## 2018-11-12 DIAGNOSIS — Z85828 Personal history of other malignant neoplasm of skin: Secondary | ICD-10-CM | POA: Insufficient documentation

## 2018-11-12 DIAGNOSIS — Z79899 Other long term (current) drug therapy: Secondary | ICD-10-CM | POA: Diagnosis not present

## 2018-11-12 DIAGNOSIS — I2510 Atherosclerotic heart disease of native coronary artery without angina pectoris: Secondary | ICD-10-CM | POA: Diagnosis not present

## 2018-11-12 LAB — CBC WITH DIFFERENTIAL/PLATELET
Abs Immature Granulocytes: 0.04 10*3/uL (ref 0.00–0.07)
Basophils Absolute: 0 10*3/uL (ref 0.0–0.1)
Basophils Relative: 1 %
Eosinophils Absolute: 0.1 10*3/uL (ref 0.0–0.5)
Eosinophils Relative: 2 %
HCT: 39.3 % (ref 39.0–52.0)
Hemoglobin: 12.9 g/dL — ABNORMAL LOW (ref 13.0–17.0)
Immature Granulocytes: 1 %
Lymphocytes Relative: 22 %
Lymphs Abs: 1.7 10*3/uL (ref 0.7–4.0)
MCH: 28.8 pg (ref 26.0–34.0)
MCHC: 32.8 g/dL (ref 30.0–36.0)
MCV: 87.7 fL (ref 80.0–100.0)
Monocytes Absolute: 0.4 10*3/uL (ref 0.1–1.0)
Monocytes Relative: 5 %
Neutro Abs: 5.4 10*3/uL (ref 1.7–7.7)
Neutrophils Relative %: 69 %
PLATELETS: 176 10*3/uL (ref 150–400)
RBC: 4.48 MIL/uL (ref 4.22–5.81)
RDW: 14.4 % (ref 11.5–15.5)
WBC: 7.7 10*3/uL (ref 4.0–10.5)
nRBC: 0 % (ref 0.0–0.2)

## 2018-11-12 LAB — BASIC METABOLIC PANEL
ANION GAP: 9 (ref 5–15)
BUN: 28 mg/dL — ABNORMAL HIGH (ref 8–23)
CO2: 20 mmol/L — ABNORMAL LOW (ref 22–32)
Calcium: 9.5 mg/dL (ref 8.9–10.3)
Chloride: 110 mmol/L (ref 98–111)
Creatinine, Ser: 1.69 mg/dL — ABNORMAL HIGH (ref 0.61–1.24)
GFR calc Af Amer: 46 mL/min — ABNORMAL LOW (ref >60)
GFR calc non Af Amer: 39 mL/min — ABNORMAL LOW (ref >60)
Glucose, Bld: 161 mg/dL — ABNORMAL HIGH (ref 70–99)
Potassium: 4.6 mmol/L (ref 3.5–5.1)
Sodium: 139 mmol/L (ref 135–145)

## 2018-11-12 LAB — I-STAT CG4 LACTIC ACID, ED: Lactic Acid, Venous: 1.15 mmol/L (ref 0.5–1.9)

## 2018-11-12 LAB — CBG MONITORING, ED: GLUCOSE-CAPILLARY: 157 mg/dL — AB (ref 70–99)

## 2018-11-12 MED ORDER — DIPHENHYDRAMINE HCL 25 MG PO CAPS
25.0000 mg | ORAL_CAPSULE | Freq: Once | ORAL | Status: AC
  Administered 2018-11-12: 25 mg via ORAL
  Filled 2018-11-12: qty 1

## 2018-11-12 MED ORDER — CEPHALEXIN 250 MG PO CAPS
500.0000 mg | ORAL_CAPSULE | Freq: Once | ORAL | Status: AC
  Administered 2018-11-12: 500 mg via ORAL
  Filled 2018-11-12: qty 2

## 2018-11-12 MED ORDER — PREDNISONE 20 MG PO TABS: 60.0000 mg | ORAL_TABLET | Freq: Once | ORAL | Status: DC

## 2018-11-12 MED ORDER — CEPHALEXIN 500 MG PO CAPS: 500.0000 mg | ORAL_CAPSULE | Freq: Four times a day (QID) | ORAL | 0 refills | Status: DC

## 2018-11-12 MED ORDER — PREDNISONE 20 MG PO TABS
40.0000 mg | ORAL_TABLET | Freq: Once | ORAL | Status: AC
  Administered 2018-11-12: 40 mg via ORAL
  Filled 2018-11-12: qty 2

## 2018-11-12 MED ORDER — DIPHENHYDRAMINE HCL 25 MG PO TABS: 25.0000 mg | ORAL_TABLET | Freq: Four times a day (QID) | ORAL | 0 refills | Status: DC | PRN

## 2018-11-12 NOTE — ED Triage Notes (Addendum)
C/o facial swelling onset yest. States the only new thing he has used is some hotel soap on Sat. , his face is more swollen today than when he went to bed. States he feels like his throat is scratchy. Clear speech at this time. Patient is scheduled for eye surgery this am.

## 2018-11-12 NOTE — ED Notes (Signed)
ED Provider at bedside. 

## 2018-11-12 NOTE — ED Provider Notes (Signed)
[facility] EMERGENCY DEPARTMENT Provider Note   CSN: [id number] Arrival date & time: 11/12/18  0448     History   Chief Complaint Chief Complaint  Patient presents with  . Allergic Reaction    HPI Jacob Berger is a 73 y.o. male.  The history is provided by the patient.  He has history of hypertension, diabetes, hyperlipidemia, chronic kidney disease, prostate cancer and comes in because of facial itching and rash which started last night.  He is scheduled for cataract and glaucoma surgery this morning but states that he had not had any change in his medications and there was no Skin-Prep that he used.  He denies any new medications or new topical agents.  He does complain of some burning in the skin and some thickening of the skin and has noted some bruising around his ears.  He denies fever or chills.  Past Medical History:  Diagnosis Date  . Arthritis    "knees" (05/10/2016)  . CAD S/P percutaneous coronary angioplasty 2011   a. 2011: s/p PCI to PDA with Endeavor 2.5 mm x 12 mm DES - [place] [facility] b. low-risk NST in 07/2015  . Cancer of kidney ([place])   . Chronic kidney disease   . Daily headache    "recently" (05/10/2016)  . Dyslipidemia, goal LDL below 70   . Glaucoma   . Gout   . History of kidney stones   . Hypertension, essential   . Obesity, Class II, BMI 35-39.9, with comorbidity    BMI 37  . OSA on CPAP   . PONV (postoperative nausea and vomiting)    after colonoscopy  . Prostate cancer ([place]) 2007  . Rash since 04-08-16   on neck healing, right arm improving  . Thoracic aortic aneurysm without rupture ([place]) - Noted on chest CT. 4.5 cm 02/15/2016   4.5 cm per dr Cyndia Bent note and [facility] 01-18-16  . Type II diabetes mellitus [facility])     Patient Active Problem List   Diagnosis Date Noted  . ED (erectile dysfunction) of organic origin 08/05/2018  . DJD (degenerative joint disease), cervical 04/22/2018  . Encounter for  pre-operative cardiovascular clearance 04/22/2018  . Benign hypertension 03/28/2018  . Coronary artery disease involving native coronary artery of native heart without angina pectoris 03/28/2018  . Cervical spondylosis with myelopathy 11/09/2017  . Chest pain with low risk for cardiac etiology 05/27/2017  . Kidney disease, chronic, stage III (GFR 30-59 ml/min) (HCC)   . Aortic atherosclerosis ([place])   . Gout   . Personal history of kidney cancer 04/13/2016  . Thoracic aortic aneurysm without rupture ([place]) - Noted on chest CT. 4.5 cm 02/15/2016  . Right carpal tunnel syndrome 04/28/2015  . Right cervical radiculopathy 04/28/2015  . Obesity (BMI 30-39.9) 06/05/2013  . CAD S/P percutaneous coronary angioplasty   . Hypertensive heart disease without CHF   . OSA on CPAP   . Diabetes mellitus, type II, insulin dependent ([place])   . Dyslipidemia, goal LDL below 70     Past Surgical History:  Procedure Laterality Date  . ANTERIOR CERVICAL DECOMP/DISCECTOMY FUSION N/A 11/09/2017   Procedure: Cervical four-five, Cervical five-six Anterior discectomy with fusion and plate fixation;  Surgeon: Ditty, Kevan Ny, MD;  Location: [facility];  Service: Neurosurgery;  Laterality: N/A;  . COLONOSCOPY W/ BIOPSIES AND POLYPECTOMY  "several"  . CORONARY ANGIOPLASTY WITH STENT PLACEMENT  2001   [place]: Endeavor 2.5 mm x 12 mm  DES - distal PDA ([facility])  . LAPAROSCOPIC NEPHRECTOMY Left 04/13/2016   Procedure: LAPAROSCOPIC RADICAL LEFT NEPHRECTOMY;  Surgeon: Raynelle Bring, MD;  [facility];  Service: Urology;  Laterality: Left;  . NM MYOVIEW ([place] HX)  6/'13, 3/'16   a. Treadmill Myoview: 10 minutes, 12 METS; no ischemia infarction, EF 65%; b. [facility]: LOW RISK. EF 57%. 10.4 METS low risk scan no ischemia. No wall motion abnormality.   Marland Kitchen NM MYOVIEW LTD  05/2017   EF 50%. Normal function. No ischemia or infarction.  Marland Kitchen PROSTATECTOMY    . REFRACTIVE SURGERY Left         Home  Medications    Prior to Admission medications   Medication Sig Start Date End Date Taking? Authorizing Provider  allopurinol (ZYLOPRIM) 300 MG tablet Take 300 mg by mouth daily.     [provider]  aspirin EC 81 MG tablet Take 1 tablet (81 mg total) by mouth daily. 08/05/18   Leonie Man, MD  AZOR 10-40 MG per tablet TAKE 1 TABLET BY MOUTH DAILY 12/18/13   Leonie Man, MD  bimatoprost (LUMIGAN) 0.01 % SOLN Place 1 drop into both eyes at bedtime.    [provider]  COD LIVER OIL PO Take 1 capsule by mouth daily.    [provider]  Coenzyme Q10 (CO Q-10) 100 MG CAPS Take 1 capsule by mouth 2 (two) times a week.     [provider]  Dulaglutide (TRULICITY) 1.5 GG/2.6RS SOPN Inject 1.5 mg into the skin every Thursday. In the afternoon 2pm    [provider]  fenofibrate (TRICOR) 145 MG tablet Take 145 mg by mouth daily at 2 PM.     [provider]  furosemide (LASIX) 80 MG tablet Take 20-40 mg by mouth daily as needed (for fluid).    [provider]  LEVEMIR FLEXTOUCH 100 UNIT/ML Pen Inject 40 Units into the skin at bedtime.  01/04/16   [provider]  Methylcellulose, Laxative, (CITRUCEL) 500 MG TABS Take 500 mg by mouth daily.     [provider]  nitroGLYCERIN (NITROSTAT) 0.4 MG SL tablet Place 1 tablet (0.4 mg total) under the tongue every 5 (five) minutes as needed for chest pain. 04/22/18 08/05/18  Kerin Ransom K, PA-C  NOVOLOG FLEXPEN 100 UNIT/ML FlexPen Inject 15 Units into the skin 3 (three) times daily before meals.  08/10/16   [provider]  sildenafil (VIAGRA) 25 MG tablet TAKE 1  TO 2 TABLET  AS NEEDED ,FOR ERECTILE DYSFUNCTION 08/05/18   Leonie Man, MD  timolol (TIMOPTIC) 0.5 % ophthalmic solution Place 1 drop into both eyes 2 (two) times daily. Morning and afternoon 10/01/17   [provider]  VYTORIN 10-40 MG per tablet Take 1 tablet by mouth at bedtime.  04/27/15    [provider]    Family History Family History  Problem Relation Age of Onset  . Hypertension Son     Social History Social History   Tobacco Use  . Smoking status: Former Smoker    Types: Cigarettes    Last attempt to quit: 06/04/1993    Years since quitting: 25.4  . Smokeless tobacco: Never Used  . Tobacco comment: A PK WOULD LAST A WEEK  Substance Use Topics  . Alcohol use: Yes    Alcohol/week: 2.0 standard drinks    Types: 1 Glasses of wine, 1 Cans of beer per week    Comment: rare  .  Drug use: No     Allergies   Accupril [quinapril hcl]   Review of Systems Review of Systems  All other systems reviewed and are negative.    Physical Exam Updated Vital Signs BP (!) 147/73 (BP Location: Right Arm)   Pulse 84   Temp (!) 97.5 F (36.4 C) (Oral)   Resp 18   Ht 6' (1.829 m)   Wt 79.8 kg   SpO2 96%   BMI 23.87 kg/m   Physical Exam Vitals signs and nursing note reviewed.    73 year old male, resting comfortably and in no acute distress. Vital signs are significant for elevated blood pressure. Oxygen saturation is 96%, which is normal. Head is normocephalic and atraumatic. PERRLA, EOMI. Oropharynx is clear.  There is mild periorbital swelling bilaterally.  Some thickening of the skin is noted in the forehead and around the ears.  There is slight drainage of serous fluid from the helix and pinna of both ears.  There is no intraoral swelling and no angioedema.  There is no pooling of secretions and phonation is normal. Neck is nontender and supple without adenopathy or JVD. Back is nontender and there is no CVA tenderness. Lungs are clear without rales, wheezes, or rhonchi. Chest is nontender. Heart has regular rate and rhythm without murmur. Abdomen is soft, flat, nontender without masses or hepatosplenomegaly and peristalsis is normoactive. Extremities have no cyanosis or edema, full range of motion is present. Skin is warm and dry without other  rash. Neurologic: Mental status is normal, cranial nerves are intact, there are no motor or sensory deficits.  ED Treatments / Results  Labs (all labs ordered are listed, but only abnormal results are displayed) Labs Reviewed  CBG MONITORING, ED - Abnormal; Notable for the following components:      Result Value   Glucose-Capillary 157 (*)    All other components within normal limits  BASIC METABOLIC PANEL  CBC WITH DIFFERENTIAL/PLATELET  SEDIMENTATION RATE  I-STAT CG4 LACTIC ACID, ED    Procedures Procedures  Medications Ordered in ED Medications  cephALEXin (KEFLEX) capsule 500 mg (has no administration in time range)  diphenhydrAMINE (BENADRYL) capsule 25 mg (has no administration in time range)  predniSONE (DELTASONE) tablet 40 mg (has no administration in time range)     Initial Impression / Assessment and Plan / ED Course  I have reviewed the triage vital signs and the nursing notes.  Pertinent lab results that were available during my care of the patient were reviewed by me and considered in my medical decision making (see chart for details).  Facial rash with possible low-grade cellulitis.  Old records are reviewed, and he has no relevant past visits.  Will check CBG, start on cephalexin and prednisone.  He will have to delay his eye surgery.  7:33 AM Patient reevaluated.  Periorbital swelling seems to be worse and patient is slightly more uncomfortable.  He is still nontoxic in appearance.  However, I am concerned that he may need to be admitted for IV antibiotics.  Screening labs are obtained.  Case is signed out to Dr. Dayna Barker.  Final Clinical Impressions(s) / ED Diagnoses   Final diagnoses:  Facial cellulitis    ED Discharge Orders    None       Delora Fuel, MD 29/47/65 [phone]

## 2018-11-12 NOTE — ED Notes (Signed)
Patient verbalizes understanding of discharge instructions. Opportunity for questioning and answers were provided. Armband removed by staff, pt discharged from ED.  

## 2018-11-12 NOTE — Discharge Instructions (Signed)
You have an area of infected skin called cellulitis.  I have started you on antibiotics.  These antibiotics do not work instantaneously.   - You should allow at least 48 hours after your first dose for the cellulitis to slow it's spreading.   - You should allow 72 hours after the first dose to see the cellulitis to start improving.   - These things may happen quicker than that but allow at least these limitations prior to returning to the emergency department or your primary doctor.   - The exceptions to this are if you become systemically ill such as having a fever, nausea, vomiting, malaise.  - The other exception is if you have a rapid spreading of the cellulitis or the redness and swelling and pain.  If you notice more than a couple inches of spreading over a couple hours you need to return to the emergency department immediately because this could be a life-threatening infection. If you notice red streaking from the site you also need to return for reevaluation.  - If not, follow up with your primary doctor as instructed for reevaluation.

## 2018-11-14 DIAGNOSIS — L258 Unspecified contact dermatitis due to other agents: Secondary | ICD-10-CM | POA: Diagnosis not present

## 2018-11-14 DIAGNOSIS — G5601 Carpal tunnel syndrome, right upper limb: Secondary | ICD-10-CM | POA: Diagnosis not present

## 2018-11-14 DIAGNOSIS — I10 Essential (primary) hypertension: Secondary | ICD-10-CM | POA: Diagnosis not present

## 2018-11-14 DIAGNOSIS — Z6837 Body mass index (BMI) 37.0-37.9, adult: Secondary | ICD-10-CM | POA: Diagnosis not present

## 2018-12-02 DIAGNOSIS — L308 Other specified dermatitis: Secondary | ICD-10-CM | POA: Diagnosis not present

## 2018-12-05 DIAGNOSIS — E1165 Type 2 diabetes mellitus with hyperglycemia: Secondary | ICD-10-CM | POA: Diagnosis not present

## 2018-12-05 DIAGNOSIS — L03211 Cellulitis of face: Secondary | ICD-10-CM | POA: Diagnosis not present

## 2018-12-06 DIAGNOSIS — M542 Cervicalgia: Secondary | ICD-10-CM | POA: Diagnosis not present

## 2018-12-06 DIAGNOSIS — G5603 Carpal tunnel syndrome, bilateral upper limbs: Secondary | ICD-10-CM | POA: Diagnosis not present

## 2018-12-06 DIAGNOSIS — G8929 Other chronic pain: Secondary | ICD-10-CM | POA: Diagnosis not present

## 2018-12-09 DIAGNOSIS — G5603 Carpal tunnel syndrome, bilateral upper limbs: Secondary | ICD-10-CM | POA: Diagnosis not present

## 2018-12-09 DIAGNOSIS — Z6837 Body mass index (BMI) 37.0-37.9, adult: Secondary | ICD-10-CM | POA: Diagnosis not present

## 2018-12-09 DIAGNOSIS — I10 Essential (primary) hypertension: Secondary | ICD-10-CM | POA: Diagnosis not present

## 2018-12-10 DIAGNOSIS — I10 Essential (primary) hypertension: Secondary | ICD-10-CM | POA: Diagnosis not present

## 2018-12-10 DIAGNOSIS — I129 Hypertensive chronic kidney disease with stage 1 through stage 4 chronic kidney disease, or unspecified chronic kidney disease: Secondary | ICD-10-CM | POA: Diagnosis not present

## 2018-12-10 DIAGNOSIS — E782 Mixed hyperlipidemia: Secondary | ICD-10-CM | POA: Diagnosis not present

## 2018-12-10 DIAGNOSIS — G4733 Obstructive sleep apnea (adult) (pediatric): Secondary | ICD-10-CM | POA: Diagnosis not present

## 2018-12-10 DIAGNOSIS — I2510 Atherosclerotic heart disease of native coronary artery without angina pectoris: Secondary | ICD-10-CM | POA: Diagnosis not present

## 2018-12-10 DIAGNOSIS — E1165 Type 2 diabetes mellitus with hyperglycemia: Secondary | ICD-10-CM | POA: Diagnosis not present

## 2018-12-10 DIAGNOSIS — I119 Hypertensive heart disease without heart failure: Secondary | ICD-10-CM | POA: Diagnosis not present

## 2018-12-10 DIAGNOSIS — E1121 Type 2 diabetes mellitus with diabetic nephropathy: Secondary | ICD-10-CM | POA: Diagnosis not present

## 2018-12-10 DIAGNOSIS — N183 Chronic kidney disease, stage 3 (moderate): Secondary | ICD-10-CM | POA: Diagnosis not present

## 2019-01-07 DIAGNOSIS — G4733 Obstructive sleep apnea (adult) (pediatric): Secondary | ICD-10-CM | POA: Diagnosis not present

## 2019-01-07 DIAGNOSIS — I129 Hypertensive chronic kidney disease with stage 1 through stage 4 chronic kidney disease, or unspecified chronic kidney disease: Secondary | ICD-10-CM | POA: Diagnosis not present

## 2019-01-07 DIAGNOSIS — Z6838 Body mass index (BMI) 38.0-38.9, adult: Secondary | ICD-10-CM | POA: Diagnosis not present

## 2019-01-07 DIAGNOSIS — N183 Chronic kidney disease, stage 3 (moderate): Secondary | ICD-10-CM | POA: Diagnosis not present

## 2019-01-07 DIAGNOSIS — E1121 Type 2 diabetes mellitus with diabetic nephropathy: Secondary | ICD-10-CM | POA: Diagnosis not present

## 2019-01-07 DIAGNOSIS — I2510 Atherosclerotic heart disease of native coronary artery without angina pectoris: Secondary | ICD-10-CM | POA: Diagnosis not present

## 2019-01-07 DIAGNOSIS — I119 Hypertensive heart disease without heart failure: Secondary | ICD-10-CM | POA: Diagnosis not present

## 2019-01-07 DIAGNOSIS — E782 Mixed hyperlipidemia: Secondary | ICD-10-CM | POA: Diagnosis not present

## 2019-01-07 DIAGNOSIS — I10 Essential (primary) hypertension: Secondary | ICD-10-CM | POA: Diagnosis not present

## 2019-01-07 DIAGNOSIS — E1165 Type 2 diabetes mellitus with hyperglycemia: Secondary | ICD-10-CM | POA: Diagnosis not present

## 2019-01-21 ENCOUNTER — Ambulatory Visit (INDEPENDENT_AMBULATORY_CARE_PROVIDER_SITE_OTHER): Payer: Medicare Other | Admitting: Physician Assistant

## 2019-01-21 ENCOUNTER — Encounter: Payer: Self-pay | Admitting: Physician Assistant

## 2019-01-21 ENCOUNTER — Other Ambulatory Visit: Payer: Self-pay

## 2019-01-21 VITALS — BP 116/74 | HR 73 | Ht 72.0 in | Wt 241.8 lb

## 2019-01-21 DIAGNOSIS — R0789 Other chest pain: Secondary | ICD-10-CM | POA: Diagnosis not present

## 2019-01-21 DIAGNOSIS — I2510 Atherosclerotic heart disease of native coronary artery without angina pectoris: Secondary | ICD-10-CM

## 2019-01-21 DIAGNOSIS — I10 Essential (primary) hypertension: Secondary | ICD-10-CM

## 2019-01-21 DIAGNOSIS — Z9861 Coronary angioplasty status: Secondary | ICD-10-CM | POA: Diagnosis not present

## 2019-01-21 DIAGNOSIS — Z01810 Encounter for preprocedural cardiovascular examination: Secondary | ICD-10-CM | POA: Diagnosis not present

## 2019-01-21 NOTE — Patient Instructions (Signed)
Medication Instructions:  Your physician recommends that you continue on your current medications as directed. Please refer to the Current Medication list given to you today.  If you need a refill on your cardiac medications before your next appointment, please call your pharmacy.   Testing/Procedures: Your physician has requested that you have a lexiscan myoview. For further information please visit [email or web address]. Please follow instruction sheet, as given.  Follow-Up: At [facility], you and your health needs are our priority.  As part of our continuing mission to provide you with exceptional heart care, we have created designated Provider Care Teams.  These Care Teams include your primary Cardiologist (physician) and Advanced Practice Providers (APPs -  Physician Assistants and Nurse Practitioners) who all work together to provide you with the care you need, when you need it. You will need a follow up appointment in September 2020.  Please call our office 2 months in advance to schedule this appointment.  You may see Glenetta Hew, MD or one of the following Advanced Practice Providers on your designated Care Team:   Rosaria Ferries, PA-C . Jory Sims, DNP, ANP  Any Other Special Instructions Will Be Listed Below (If Applicable). None

## 2019-01-21 NOTE — Progress Notes (Signed)
Cardiology Office Note   Date:  01/21/2019   ID:  Jacob Berger [birth year], MRN [id number]  PCP:  Jacob Seashore, MD Cardiologist:  Jacob Hew, MD 08/05/2018 Jacob Ferries, PA-C   No chief complaint on file.   History of Present Illness: Jacob Berger is a 73 y.o. male with a history of CAD-PCI-RCA 2011as well as Hypertensive Heart Disease & 4.8 cm Thoracic Aortic Aneurysm (follwed by Dr. Cyndia Berger) along with HLD, OSA-CPAP, DM-2 & CKD (s/p L Nephrectomy for RCCA)   Jacob Berger presents for cardiology evaluation and follow up.  His labs are followed by PCP and DM MD. A1c has gone up from 7.9, he is working on that. Was told other labs are ok.  He had been walking but his L knee is bothering him, limiting his activity. He is to have a knee replacement when stable from a general medical standpoint.   He is compliant w/ CPAP, has a new machine.  He is having some chest tightness, 4-5 at its worst. The worst is when he wakes up in the morning. He does not notice it during the day. He notices it at times before he goes to sleep. Not as bad as when he wakes up. Does not take rx for it. No change w/ deep inspiration.  Has not noticed it when walking or other exertion. Takes ibuprofen and Benadryl to sleep, they help him. Wonders if it could be GI in origin.  He has severe carpal tunnel in both wrists, needs surgery on the L one first, that is scheduled for later this month.   Past Medical History:  Diagnosis Date  . Arthritis    "knees" (05/10/2016)  . CAD S/P percutaneous coronary angioplasty 2011   a. 2011: s/p PCI to PDA with Endeavor 2.5 mm x 12 mm DES - [place] [facility] b. low-risk NST in 07/2015  . Cancer of kidney ([place])   . Chronic kidney disease   . Daily headache    "recently" (05/10/2016)  . Dyslipidemia, goal LDL below 70   . Glaucoma   . Gout   . History of kidney stones   . Hypertension, essential   . Obesity, Class II, BMI  35-39.9, with comorbidity    BMI 37  . OSA on CPAP   . PONV (postoperative nausea and vomiting)    after colonoscopy  . Prostate cancer ([place]) 2007  . Rash since 04-08-16   on neck healing, right arm improving  . Thoracic aortic aneurysm without rupture ([place]) - Noted on chest CT. 4.5 cm 02/15/2016   4.5 cm per dr Jacob Berger note and [facility] 01-18-16  . Type II diabetes mellitus ([place])     Past Surgical History:  Procedure Laterality Date  . ANTERIOR CERVICAL DECOMP/DISCECTOMY FUSION N/A 11/09/2017   Procedure: Cervical four-five, Cervical five-six Anterior discectomy with fusion and plate fixation;  Surgeon: Ditty, Kevan Ny, MD;  Location: [place];  Service: Neurosurgery;  Laterality: N/A;  . COLONOSCOPY W/ BIOPSIES AND POLYPECTOMY  "several"  . CORONARY ANGIOPLASTY WITH STENT PLACEMENT  2001   [place]: Endeavor 2.5 mm x 12 mm DES - distal PDA ([facility])  . LAPAROSCOPIC NEPHRECTOMY Left 04/13/2016   Procedure: LAPAROSCOPIC RADICAL LEFT NEPHRECTOMY;  Surgeon: Jacob Bring, MD;  [facility];  Service: Urology;  Laterality: Left;  . NM MYOVIEW ([place] HX)  6/'13, 3/'16   a. Treadmill Myoview: 10 minutes, 12 METS; no ischemia infarction, EF  65%; b. [facility]: LOW RISK. EF 57%. 10.4 METS low risk scan no ischemia. No wall motion abnormality.   Marland Kitchen NM MYOVIEW LTD  05/2017   EF 50%. Normal function. No ischemia or infarction.  Marland Kitchen PROSTATECTOMY    . REFRACTIVE SURGERY Left     Current Outpatient Medications  Medication Sig Dispense Refill  . allopurinol (ZYLOPRIM) 300 MG tablet Take 300 mg by mouth daily.     Marland Kitchen aspirin EC 81 MG tablet Take 1 tablet (81 mg total) by mouth daily. 90 tablet 3  . AZOR 10-40 MG per tablet TAKE 1 TABLET BY MOUTH DAILY 90 tablet 2  . bimatoprost (LUMIGAN) 0.01 % SOLN Place 1 drop into both eyes at bedtime.    . cephALEXin (KEFLEX) 500 MG capsule Take 1 capsule (500 mg total) by mouth 4 (four) times daily. 40 capsule 0  . diphenhydrAMINE  (BENADRYL) 25 MG tablet Take 1 tablet (25 mg total) by mouth every 6 (six) hours as needed for itching. 20 tablet 0  . diphenhydramine-acetaminophen (TYLENOL PM) 25-500 MG TABS tablet Take 1 tablet by mouth at bedtime as needed (sleep).    . Dulaglutide (TRULICITY) 1.5 TK/3.5WS SOPN Inject 1.5 mg into the skin every Thursday. In the afternoon 2pm    . fenofibrate (TRICOR) 145 MG tablet Take 145 mg by mouth daily at 2 PM.     . furosemide (LASIX) 80 MG tablet Take 20-40 mg by mouth daily as needed (for fluid).    Ernest Mallick FLEXTOUCH 100 UNIT/ML Pen Inject 48 Units into the skin at bedtime.     . Methylcellulose, Laxative, (CITRUCEL) 500 MG TABS Take 500 mg by mouth daily.     . Multiple Vitamins-Minerals (EMERGEN-C IMMUNE PO) Take 1 tablet by mouth daily.    Marland Kitchen NOVOLOG FLEXPEN 100 UNIT/ML FlexPen Inject 15 Units into the skin 3 (three) times daily before meals.     . sildenafil (VIAGRA) 25 MG tablet TAKE 1  TO 2 TABLET  AS NEEDED ,FOR ERECTILE DYSFUNCTION 10 tablet 11  . timolol (TIMOPTIC) 0.5 % ophthalmic solution Place 1 drop into both eyes 2 (two) times daily. Morning and afternoon  5  . Ubiquinone (COENZYME Q-10) 20 MG/ML SOLN Inject 5 mLs as directed 2 (two) times a week.    Marland Kitchen VYTORIN 10-40 MG per tablet Take 1 tablet by mouth at bedtime.     . nitroGLYCERIN (NITROSTAT) 0.4 MG SL tablet Place 1 tablet (0.4 mg total) under the tongue every 5 (five) minutes as needed for chest pain. 25 tablet 2   No current facility-administered medications for this visit.    Facility-Administered Medications Ordered in Other Visits  Medication Dose Route Frequency Provider Last Rate Last Dose  . 0.9 %  sodium chloride infusion   Intra-arterial PRN Ditty, Kevan Ny, MD        Allergies:   Accupril [quinapril hcl]    Social History:  The patient  reports that he quit smoking about 25 years ago. His smoking use included cigarettes. He has never used smokeless tobacco. He reports current alcohol use of  about 2.0 standard drinks of alcohol per week. He reports that he does not use drugs.   Family History:  The patient's family history includes Hypertension in his son.  He indicated that his mother is deceased. He indicated that his father is deceased. He indicated that his maternal grandmother is deceased. He indicated that his maternal grandfather is deceased. He indicated that his paternal grandmother is deceased.  He indicated that his paternal grandfather is deceased. He indicated that his daughter is alive. He indicated that both of his sons are alive.   ROS:  Please see the history of present illness. All other systems are reviewed and negative.    PHYSICAL EXAM: VS:  BP 116/74   Pulse 73   Ht 6' (1.829 m)   Wt 241 lb 12.8 oz (109.7 kg)   BMI 32.79 kg/m  , BMI Body mass index is 32.79 kg/m. GEN: Well nourished, well developed, male in no acute distress HEENT: normal for age  Neck: no JVD, no carotid bruit, no masses Cardiac: RRR; no murmur, no rubs, or gallops Respiratory:  clear to auscultation bilaterally, normal work of breathing GI: soft, nontender, nondistended, + BS MS: no deformity or atrophy; no edema; distal pulses are 2+ in all 4 extremities  Skin: warm and dry, no rash Neuro:  Strength and sensation are intact Psych: euthymic mood, full affect   EKG:  EKG is ordered today. The ekg ordered today demonstrates SR, HR 73, no acute ischemic changes, no change from 04/2018  ECHO: 05/10/2016 - Left ventricle: The cavity size was normal. There was mild   concentric hypertrophy. Systolic function was normal. The   estimated ejection fraction was in the range of 60% to 65%. Wall   motion was normal; there were no regional wall motion   abnormalities. There was an increased relative contribution of   atrial contraction to ventricular filling. Doppler parameters are   consistent with abnormal left ventricular relaxation (grade 1   diastolic dysfunction). - Aortic valve:  There was mild regurgitation. - Atrial septum: No defect or patent foramen ovale was identified.  CATH: [place] 2011, ENDEAVOR DES PDA, single vessel dz  MYOVIEW: 05/27/2017 FINDINGS: Perfusion: No decreased activity in the left ventricle on stress imaging to suggest reversible ischemia or infarction.  Wall Motion: Normal left ventricular wall motion. No left ventricular dilation.  Left Ventricular Ejection Fraction: 58 %  End diastolic volume 973 ml  End systolic volume 46 ml  IMPRESSION: 1. No reversible ischemia or infarction.  2. Normal left ventricular wall motion.  3. Left ventricular ejection fraction 58%  4. Non invasive risk stratification*: Low   Recent Labs: 11/12/2018: BUN 28; Creatinine, Ser 1.69; Hemoglobin 12.9; Platelets 176; Potassium 4.6; Sodium 139  CBC    Component Value Date/Time   WBC 7.7 11/12/2018 0858   RBC 4.48 11/12/2018 0858   HGB 12.9 (L) 11/12/2018 0858   HCT 39.3 11/12/2018 0858   PLT 176 11/12/2018 0858   MCV 87.7 11/12/2018 0858   MCH 28.8 11/12/2018 0858   MCHC 32.8 11/12/2018 0858   RDW 14.4 11/12/2018 0858   LYMPHSABS 1.7 11/12/2018 0858   MONOABS 0.4 11/12/2018 0858   EOSABS 0.1 11/12/2018 0858   BASOSABS 0.0 11/12/2018 0858   CMP Latest Ref Rng & Units 11/12/2018 11/23/2017 11/07/2017  Glucose 70 - 99 mg/dL 161(H) 358(H) 341(H)  BUN 8 - 23 mg/dL 28(H) 32(H) 32(H)  Creatinine 0.61 - 1.24 mg/dL 1.69(H) 1.77(H) 2.00(H)  Sodium 135 - 145 mmol/L 139 134(L) 136  Potassium 3.5 - 5.1 mmol/L 4.6 4.4 4.0  Chloride 98 - 111 mmol/L 110 101 105  CO2 22 - 32 mmol/L 20(L) 19(L) 21(L)  Calcium 8.9 - 10.3 mg/dL 9.5 9.3 9.4  Total Protein 6.5 - 8.1 g/dL - 7.3 7.5  Total Bilirubin 0.3 - 1.2 mg/dL - 0.6 0.3  Alkaline Phos 38 - 126 U/L - 104 69  AST 15 - 41 U/L - 28 31  ALT 17 - 63 U/L - 20 23     Lipid Panel Lab Results  Component Value Date   CHOL 105 05/27/2017   HDL 25 (L) 05/27/2017   LDLCALC 35 05/27/2017   TRIG 224 (H)  05/27/2017   CHOLHDL 4.2 05/27/2017      Wt Readings from Last 3 Encounters:  01/21/19 241 lb 12.8 oz (109.7 kg)  11/12/18 176 lb (79.8 kg)  08/05/18 272 lb (123.4 kg)     Other studies Reviewed: Additional studies/ records that were reviewed today include: office notes, hospital records and testing.  ASSESSMENT AND PLAN:  1.  Preop evaluation: He has multiple CRFs, no cath since 2011, no MV in 2 years. - with his chest discomfort, will schedule a MV, no further eval needed if this is low risk. - OK to stop the ASA for the surgery, if needed.  2. CAD: he is on ASA 81 mg, not on BB, but BP is low-nl, not sure he would tolerate it. EF has been normal. On ARB/CCB w/ no recent dose change. - was previously on Toprol XL 100>>50 mg but it fell off his med list in 2018. Telephone Note says Dr Jacob Berger stopped it, but not mentioned in his note. - will leave to Dr Ellyn Hack to restart at a lower dose.   3. HTN:  - BP normal today, pt says it normally runs good - no med changes now, see notes above on BB   Current medicines are reviewed at length with the patient today.  The patient does not have concerns regarding medicines.  The following changes have been made:  no change  Labs/ tests ordered today include:   Orders Placed This Encounter  Procedures  . MYOCARDIAL PERFUSION IMAGING  . EKG 12-Lead     Disposition:   FU with Jacob Hew, MD  Signed, Jacob Ferries, PA-C  01/21/2019 9:57 AM    [place] Phone: [phone]; Fax: [phone]

## 2019-01-27 ENCOUNTER — Telehealth: Payer: Self-pay | Admitting: Physician Assistant

## 2019-01-27 NOTE — Telephone Encounter (Signed)
Pt seen 03/17 and MV scheduled.  After reviewing the note, I feel it is appropriate to reschedule him in 6-12 weeks.   I left a message on his home phone saying we would reschedule him.

## 2019-01-28 ENCOUNTER — Telehealth: Payer: Self-pay

## 2019-01-28 NOTE — Telephone Encounter (Signed)
New message  Left a message on home voice mail to call back to reschedule an appointment due to mandatory government regulations regarding COVID-19, Your physician has reviewed and has agreed to have your test postponed and rescheduled for a later date.

## 2019-02-03 ENCOUNTER — Other Ambulatory Visit: Payer: Self-pay | Admitting: Surgery

## 2019-02-03 DIAGNOSIS — I712 Thoracic aortic aneurysm, without rupture, unspecified: Secondary | ICD-10-CM

## 2019-02-05 ENCOUNTER — Telehealth: Payer: Self-pay | Admitting: Physician Assistant

## 2019-02-05 NOTE — Telephone Encounter (Signed)
Left message for pt to call back to reschedule stress test per Rosaria Ferries, PA.

## 2019-02-07 NOTE — Telephone Encounter (Signed)
Per Neil Crouch, stress test has been rescheduled to [facility] office.

## 2019-02-12 ENCOUNTER — Ambulatory Visit (HOSPITAL_COMMUNITY): Payer: Medicare Other

## 2019-02-12 ENCOUNTER — Ambulatory Visit (HOSPITAL_COMMUNITY): Payer: Medicare Other | Attending: Cardiology

## 2019-02-12 ENCOUNTER — Other Ambulatory Visit: Payer: Self-pay

## 2019-02-12 DIAGNOSIS — Z01810 Encounter for preprocedural cardiovascular examination: Secondary | ICD-10-CM

## 2019-02-12 DIAGNOSIS — R0789 Other chest pain: Secondary | ICD-10-CM | POA: Diagnosis not present

## 2019-02-12 LAB — MYOCARDIAL PERFUSION IMAGING
LV dias vol: 120 mL (ref 62–150)
LV sys vol: 49 mL
Peak HR: 92 {beats}/min
Rest HR: 62 {beats}/min
SDS: 1
SRS: 0
SSS: 1
TID: 1.05

## 2019-02-12 MED ORDER — TECHNETIUM TC 99M TETROFOSMIN IV KIT
9.5000 | PACK | Freq: Once | INTRAVENOUS | Status: AC | PRN
  Administered 2019-02-12: 9.5 via INTRAVENOUS
  Filled 2019-02-12: qty 10

## 2019-02-12 MED ORDER — REGADENOSON 0.4 MG/5ML IV SOLN
0.4000 mg | Freq: Once | INTRAVENOUS | Status: AC
  Administered 2019-02-12: 0.4 mg via INTRAVENOUS

## 2019-02-12 MED ORDER — TECHNETIUM TC 99M TETROFOSMIN IV KIT
32.9000 | PACK | Freq: Once | INTRAVENOUS | Status: AC | PRN
  Administered 2019-02-12: 32.9 via INTRAVENOUS
  Filled 2019-02-12: qty 33

## 2019-02-21 DIAGNOSIS — I119 Hypertensive heart disease without heart failure: Secondary | ICD-10-CM | POA: Diagnosis not present

## 2019-02-21 DIAGNOSIS — Z0000 Encounter for general adult medical examination without abnormal findings: Secondary | ICD-10-CM | POA: Diagnosis not present

## 2019-02-21 DIAGNOSIS — E1121 Type 2 diabetes mellitus with diabetic nephropathy: Secondary | ICD-10-CM | POA: Diagnosis not present

## 2019-02-21 DIAGNOSIS — E1165 Type 2 diabetes mellitus with hyperglycemia: Secondary | ICD-10-CM | POA: Diagnosis not present

## 2019-02-21 DIAGNOSIS — I2510 Atherosclerotic heart disease of native coronary artery without angina pectoris: Secondary | ICD-10-CM | POA: Diagnosis not present

## 2019-02-21 DIAGNOSIS — Z125 Encounter for screening for malignant neoplasm of prostate: Secondary | ICD-10-CM | POA: Diagnosis not present

## 2019-02-24 ENCOUNTER — Telehealth: Payer: Self-pay | Admitting: Cardiology

## 2019-02-24 NOTE — Telephone Encounter (Signed)
Called patient, gave Stress Test Results.  He ask that I send it to PCP. Will route to assistant working with PA to notify results were given.

## 2019-02-24 NOTE — Telephone Encounter (Signed)
Per pt call -  Stated needs a call back would like his stress test RESULTS  Please give him a call.

## 2019-02-28 DIAGNOSIS — N183 Chronic kidney disease, stage 3 (moderate): Secondary | ICD-10-CM | POA: Diagnosis not present

## 2019-02-28 DIAGNOSIS — E1121 Type 2 diabetes mellitus with diabetic nephropathy: Secondary | ICD-10-CM | POA: Diagnosis not present

## 2019-02-28 DIAGNOSIS — I700 Atherosclerosis of aorta: Secondary | ICD-10-CM | POA: Diagnosis not present

## 2019-02-28 DIAGNOSIS — C642 Malignant neoplasm of left kidney, except renal pelvis: Secondary | ICD-10-CM | POA: Diagnosis not present

## 2019-02-28 DIAGNOSIS — E782 Mixed hyperlipidemia: Secondary | ICD-10-CM | POA: Diagnosis not present

## 2019-02-28 DIAGNOSIS — I712 Thoracic aortic aneurysm, without rupture: Secondary | ICD-10-CM | POA: Diagnosis not present

## 2019-02-28 DIAGNOSIS — I119 Hypertensive heart disease without heart failure: Secondary | ICD-10-CM | POA: Diagnosis not present

## 2019-02-28 DIAGNOSIS — I2510 Atherosclerotic heart disease of native coronary artery without angina pectoris: Secondary | ICD-10-CM | POA: Diagnosis not present

## 2019-02-28 DIAGNOSIS — E1165 Type 2 diabetes mellitus with hyperglycemia: Secondary | ICD-10-CM | POA: Diagnosis not present

## 2019-02-28 DIAGNOSIS — M1A00X0 Idiopathic chronic gout, unspecified site, without tophus (tophi): Secondary | ICD-10-CM | POA: Diagnosis not present

## 2019-02-28 DIAGNOSIS — I129 Hypertensive chronic kidney disease with stage 1 through stage 4 chronic kidney disease, or unspecified chronic kidney disease: Secondary | ICD-10-CM | POA: Diagnosis not present

## 2019-03-11 DIAGNOSIS — I2510 Atherosclerotic heart disease of native coronary artery without angina pectoris: Secondary | ICD-10-CM | POA: Diagnosis not present

## 2019-03-11 DIAGNOSIS — Z6838 Body mass index (BMI) 38.0-38.9, adult: Secondary | ICD-10-CM | POA: Diagnosis not present

## 2019-03-11 DIAGNOSIS — I129 Hypertensive chronic kidney disease with stage 1 through stage 4 chronic kidney disease, or unspecified chronic kidney disease: Secondary | ICD-10-CM | POA: Diagnosis not present

## 2019-03-11 DIAGNOSIS — E1165 Type 2 diabetes mellitus with hyperglycemia: Secondary | ICD-10-CM | POA: Diagnosis not present

## 2019-03-11 DIAGNOSIS — I10 Essential (primary) hypertension: Secondary | ICD-10-CM | POA: Diagnosis not present

## 2019-03-11 DIAGNOSIS — N183 Chronic kidney disease, stage 3 (moderate): Secondary | ICD-10-CM | POA: Diagnosis not present

## 2019-03-11 DIAGNOSIS — E1121 Type 2 diabetes mellitus with diabetic nephropathy: Secondary | ICD-10-CM | POA: Diagnosis not present

## 2019-03-11 DIAGNOSIS — Z7189 Other specified counseling: Secondary | ICD-10-CM | POA: Diagnosis not present

## 2019-03-11 DIAGNOSIS — G4733 Obstructive sleep apnea (adult) (pediatric): Secondary | ICD-10-CM | POA: Diagnosis not present

## 2019-03-11 DIAGNOSIS — I119 Hypertensive heart disease without heart failure: Secondary | ICD-10-CM | POA: Diagnosis not present

## 2019-03-11 DIAGNOSIS — E782 Mixed hyperlipidemia: Secondary | ICD-10-CM | POA: Diagnosis not present

## 2019-04-01 DIAGNOSIS — I10 Essential (primary) hypertension: Secondary | ICD-10-CM | POA: Diagnosis not present

## 2019-04-01 DIAGNOSIS — Z6836 Body mass index (BMI) 36.0-36.9, adult: Secondary | ICD-10-CM | POA: Diagnosis not present

## 2019-04-01 DIAGNOSIS — M542 Cervicalgia: Secondary | ICD-10-CM | POA: Diagnosis not present

## 2019-04-08 ENCOUNTER — Other Ambulatory Visit: Payer: Self-pay | Admitting: Neurosurgery

## 2019-04-09 ENCOUNTER — Ambulatory Visit: Payer: Medicare Other | Admitting: Surgery

## 2019-04-09 ENCOUNTER — Inpatient Hospital Stay: Admission: RE | Admit: 2019-04-09 | Payer: Medicare Other | Source: Ambulatory Visit

## 2019-04-10 ENCOUNTER — Encounter: Payer: Self-pay | Admitting: Surgery

## 2019-04-14 ENCOUNTER — Other Ambulatory Visit: Payer: Self-pay | Admitting: Neurosurgery

## 2019-04-14 DIAGNOSIS — G8929 Other chronic pain: Secondary | ICD-10-CM

## 2019-04-24 ENCOUNTER — Other Ambulatory Visit: Payer: Self-pay | Admitting: Orthopaedic Surgery

## 2019-04-24 DIAGNOSIS — H401133 Primary open-angle glaucoma, bilateral, severe stage: Secondary | ICD-10-CM | POA: Diagnosis not present

## 2019-05-03 DIAGNOSIS — Z20828 Contact with and (suspected) exposure to other viral communicable diseases: Secondary | ICD-10-CM | POA: Diagnosis not present

## 2019-05-03 DIAGNOSIS — R0602 Shortness of breath: Secondary | ICD-10-CM | POA: Diagnosis not present

## 2019-05-03 DIAGNOSIS — Z1159 Encounter for screening for other viral diseases: Secondary | ICD-10-CM | POA: Diagnosis not present

## 2019-05-06 DIAGNOSIS — E7800 Pure hypercholesterolemia, unspecified: Secondary | ICD-10-CM | POA: Diagnosis not present

## 2019-05-06 DIAGNOSIS — Z87891 Personal history of nicotine dependence: Secondary | ICD-10-CM | POA: Diagnosis not present

## 2019-05-06 DIAGNOSIS — Z794 Long term (current) use of insulin: Secondary | ICD-10-CM | POA: Diagnosis not present

## 2019-05-06 DIAGNOSIS — H2512 Age-related nuclear cataract, left eye: Secondary | ICD-10-CM | POA: Diagnosis not present

## 2019-05-06 DIAGNOSIS — H2589 Other age-related cataract: Secondary | ICD-10-CM | POA: Diagnosis not present

## 2019-05-06 DIAGNOSIS — H401131 Primary open-angle glaucoma, bilateral, mild stage: Secondary | ICD-10-CM | POA: Diagnosis not present

## 2019-05-06 DIAGNOSIS — H401120 Primary open-angle glaucoma, left eye, stage unspecified: Secondary | ICD-10-CM | POA: Diagnosis not present

## 2019-05-06 DIAGNOSIS — E1122 Type 2 diabetes mellitus with diabetic chronic kidney disease: Secondary | ICD-10-CM | POA: Diagnosis not present

## 2019-05-06 DIAGNOSIS — I2510 Atherosclerotic heart disease of native coronary artery without angina pectoris: Secondary | ICD-10-CM | POA: Diagnosis not present

## 2019-05-06 DIAGNOSIS — I1310 Hypertensive heart and chronic kidney disease without heart failure, with stage 1 through stage 4 chronic kidney disease, or unspecified chronic kidney disease: Secondary | ICD-10-CM | POA: Diagnosis not present

## 2019-05-06 DIAGNOSIS — N183 Chronic kidney disease, stage 3 (moderate): Secondary | ICD-10-CM | POA: Diagnosis not present

## 2019-05-06 DIAGNOSIS — Z79899 Other long term (current) drug therapy: Secondary | ICD-10-CM | POA: Diagnosis not present

## 2019-05-09 ENCOUNTER — Other Ambulatory Visit: Payer: Self-pay

## 2019-05-09 ENCOUNTER — Ambulatory Visit
Admission: RE | Admit: 2019-05-09 | Discharge: 2019-05-09 | Disposition: A | Discharge status: Home / Self Care | Payer: Medicare Other | Source: Ambulatory Visit | Attending: Neurosurgery | Admitting: Neurosurgery

## 2019-05-09 DIAGNOSIS — M542 Cervicalgia: Secondary | ICD-10-CM | POA: Diagnosis not present

## 2019-05-09 DIAGNOSIS — G8929 Other chronic pain: Secondary | ICD-10-CM

## 2019-05-09 MED ORDER — GADOBENATE DIMEGLUMINE 529 MG/ML IV SOLN
20.0000 mL | Freq: Once | INTRAVENOUS | Status: AC | PRN
  Administered 2019-05-09: 20 mL via INTRAVENOUS

## 2019-05-20 ENCOUNTER — Telehealth: Payer: Self-pay

## 2019-05-20 ENCOUNTER — Telehealth: Payer: Self-pay | Admitting: Cardiology

## 2019-05-20 DIAGNOSIS — G5603 Carpal tunnel syndrome, bilateral upper limbs: Secondary | ICD-10-CM | POA: Diagnosis not present

## 2019-05-20 DIAGNOSIS — I10 Essential (primary) hypertension: Secondary | ICD-10-CM | POA: Diagnosis not present

## 2019-05-20 DIAGNOSIS — Z6837 Body mass index (BMI) 37.0-37.9, adult: Secondary | ICD-10-CM | POA: Diagnosis not present

## 2019-05-20 NOTE — Telephone Encounter (Signed)
    [facility] Pre-operative Risk Assessment    Request for surgical clearance:  1. What type of surgery is being performed? Left Knee Surgery  2. When is this surgery scheduled? 05/27/19  3. What type of clearance is required (medical clearance vs. Pharmacy clearance to hold med vs. Both)? Both  4. Are there any medications that need to be held prior to surgery and how long? Aspirin    5. Practice name and name of physician performing surgery? [facility], Dr. Jerald Kief  6. What is your office phone number [phone]    7.   What is your office fax number [phone]  8.   Anesthesia type (None, local, MAC, general) ?None listed   Jacob Berger 05/20/2019, 4:28 PM  _________________________________________________________________   (provider comments below)

## 2019-05-20 NOTE — Telephone Encounter (Signed)
Follow Up    Jacob Berger from Dr [facility] office called. She is checking on the status of pt's clearance that was faxed here on 05-01-19. Please fax this back asap to [phone].:

## 2019-05-20 NOTE — Telephone Encounter (Signed)
error 

## 2019-05-20 NOTE — Telephone Encounter (Signed)
Clearance was sent

## 2019-05-20 NOTE — Telephone Encounter (Signed)
I do not see we rec'd  Ronny Bacon could you call that office and fill out form so we can clear?  Thanks.

## 2019-05-20 NOTE — Telephone Encounter (Signed)
   Primary Cardiologist: Glenetta Hew, MD  Chart reviewed as part of pre-operative protocol coverage. Patient was contacted 05/20/2019 in reference to pre-operative risk assessment for pending surgery as outlined below.  Isaiyah Feldhaus Zuniga was last seen on 01/21/19 by Rosaria Ferries, PA for Dr. Ellyn Hack.  He had stress test that was neg for ischemia, also has hx of CAD, Thoracic aortic aneurysm followed by Dr. Cyndia Bent - pt was cleared for surgery but due to covid there is time from test to procedure.  Pt is stable on phone call.  Since that day, Nivin Braniff has done well.  Therefore, based on ACC/AHA guidelines, the patient would be at acceptable risk for the planned procedure without further cardiovascular testing. Pt may hold ASA 5-7 days for procedure.    I will route this recommendation to the requesting party via Epic fax function and remove from pre-op pool.  Please call with questions.  Cecilie Kicks, NP 05/20/2019, 4:29 PM

## 2019-05-20 NOTE — Telephone Encounter (Signed)
Tried reaching requesting surgeon's office but there was not answer and couldn't even leave a voicemail.   I left a message for the patient to return my call about pre-clearance to address what kind of surgery pt is having.

## 2019-05-22 NOTE — Progress Notes (Signed)
CARDIAC CLEARANCE LAURA INGOLD NP 7-14 20 Epic LOV CARDIOLOGY DR HARDING 08-05-18 Epic LOV PCP DR Ashby Dawes 02-28-19 Epic EKG 01-21-19 (INTERPRETAION IN CARDIOLOGY LOV NOTE 01-21-19) Echo 05-10-16 Chest ct 04-03-18 epic (ascending thoracici aneurysm 4.8 cm, see dr Cyndia Bent) Bilateral carotid duplex 12-27-16 epic

## 2019-05-22 NOTE — Patient Instructions (Addendum)
YOU NEED TO HAVE A COVID 19 TEST ON 05-23-2019 AT 135 PM.  ONCE YOUR COVID TEST IS COMPLETED, PLEASE BEGIN THE QUARANTINE INSTRUCTIONS AS OUTLINED IN YOUR HANDOUT.                Jacob Berger    Your procedure is scheduled on: 05-27-2019    Report to [facility] Main  Entrance    Report to Admitting at 7:40  AM    BRING CPAP [place]   Call this number if you have problems the morning of surgery [phone]    Remember:    NO SOLID FOOD AFTER MIDNIGHT THE NIGHT PRIOR TO SURGERY. NOTHING BY MOUTH EXCEPT CLEAR LIQUIDS UNTIL 7:10 AM.  PLEASE FINISH G2 DRINK PER SURGEON ORDER 3 HOURS PRIOR TO SCHEDULED SURGERY TIME WHICH NEEDS TO BE COMPLETED AT 7:10 AM.   CLEAR LIQUID DIET   Foods Allowed                                                                     Foods Excluded  Coffee and tea, regular and decaf                             liquids that you cannot  Plain Jell-O any favor except red or purple                                           see through such as: Fruit ices (not with fruit pulp)                                     milk, soups, orange juice  Iced Popsicles                                    All solid food Carbonated beverages, regular and diet                                    Cranberry, grape and apple juices Sports drinks like Gatorade Lightly seasoned clear broth or consume(fat free) Sugar, honey syrup  Sample Menu Breakfast                                Lunch                                     Supper Cranberry juice                    Beef broth                            Chicken broth Jell-O  Grape juice                           Apple juice Coffee or tea                        Jell-O                                      Popsicle                                                Coffee or tea                        Coffee or  tea  _____________________________________________________________________     Take these medicines the morning of surgery with A SIP OF WATER: Allopurinol (Zyloprim)  BRUSH YOUR TEETH MORNING OF SURGERY AND RINSE YOUR MOUTH OUT, NO CHEWING GUM CANDY OR MINTS.               You may not have any metal on your body including hair pins and              piercings     Do not wear jewelry,cologne, lotions, powders or perfumes, deodorant                        Men may shave face and neck.   Do not bring valuables to the hospital. [place].  Contacts, dentures or bridgework may not be worn into surgery.       _____________________________________________________________________  DO NOT TAKE ANY DIABETIC MEDICATIONS DAY OF YOUR SURGERY     How to Manage Your Diabetes Before and After Surgery  Why is it important to control my blood sugar before and after surgery? . Improving blood sugar levels before and after surgery helps healing and can limit problems. . A way of improving blood sugar control is eating a healthy diet by: o  Eating less sugar and carbohydrates o  Increasing activity/exercise o  Talking with your doctor about reaching your blood sugar goals . High blood sugars (greater than 180 mg/dL) can raise your risk of infections and slow your recovery, so you will need to focus on controlling your diabetes during the weeks before surgery. . Make sure that the doctor who takes care of your diabetes knows about your planned surgery including the date and location.  How do I manage my blood sugar before surgery? . Check your blood sugar at least 4 times a day, starting 2 days before surgery, to make sure that the level is not too high or low. o Check your blood sugar the morning of your surgery when you wake up and every 2 hours until you get to the Short Stay unit. . If your blood sugar is less than 70 mg/dL, you will need to  treat for low blood sugar: o Do not take insulin. o Treat a low blood sugar (less than 70 mg/dL) with  cup of clear juice (cranberry or apple), 4 glucose tablets, OR glucose gel.  o Recheck blood sugar in 15 minutes after treatment (to make sure it is greater than 70 mg/dL). If your blood sugar is not greater than 70 mg/dL on recheck, call [phone] for further instructions. . Report your blood sugar to the short stay nurse when you get to Short Stay.  . If you are admitted to the hospital after surgery: o Your blood sugar will be checked by the staff and you will probably be given insulin after surgery (instead of oral diabetes medicines) to make sure you have good blood sugar levels. o The goal for blood sugar control after surgery is 80-180 mg/dL.   WHAT DO I DO ABOUT MY DIABETES MEDICATION?  .  TAKE TRULICITY AS USUAL ON Thursday.  Marland Kitchen TAKE YOUR USUAL 15 UNITS OF  MEAL COVERAGE OF NOVOLOG  . THE NIGHT BEFORE SURGERY TAKE 1/2 DOSE OF LEVEMIR INSULIN (TAKE 21 UNITS)   THE MORNING OF SURGERY DO NOT TAKE LEVEMIR.     . The day of surgery, do not take other diabetes injectables, including Byetta (exenatide), Bydureon (exenatide ER), Victoza (liraglutide), or Trulicity (dulaglutide).  . If your CBG is greater than 220 mg/dL, you may take  of your sliding scale  . (correction) dose of insulin.     Reviewed and Endorsed by [facility] Patient Education Committee, August 2015                           [facility] - Preparing for Surgery Before surgery, you can play an important role.  Because skin is not sterile, your skin needs to be as free of germs as possible.  You can reduce the number of germs on your skin by washing with CHG (chlorahexidine gluconate) soap before surgery.  CHG is an antiseptic cleaner which kills germs and bonds with the skin to continue killing germs even after washing. Please DO NOT use if you have an allergy to CHG or antibacterial soaps.  If your skin  becomes reddened/irritated stop using the CHG and inform your nurse when you arrive at Short Stay. Do not shave (including legs and underarms) for at least 48 hours prior to the first CHG shower.  You may shave your face/neck. Please follow these instructions carefully:  1.  Shower with CHG Soap the night before surgery and the  morning of Surgery.  2.  If you choose to wash your hair, wash your hair first as usual with your  normal  shampoo.  3.  After you shampoo, rinse your hair and body thoroughly to remove the  shampoo.                           4.  Use CHG as you would any other liquid soap.  You can apply chg directly  to the skin and wash                       Gently with a scrungie or clean washcloth.  5.  Apply the CHG Soap to your body ONLY FROM THE NECK DOWN.   Do not use on face/ open                           Wound or open sores. Avoid contact with eyes, ears mouth and genitals (private parts).  Wash face,  Genitals (private parts) with your normal soap.             6.  Wash thoroughly, paying special attention to the area where your surgery  will be performed.  7.  Thoroughly rinse your body with warm water from the neck down.  8.  DO NOT shower/wash with your normal soap after using and rinsing off  the CHG Soap.                9.  Pat yourself dry with a clean towel.            10.  Wear clean pajamas.            11.  Place clean sheets on your bed the night of your first shower and do not  sleep with pets. Day of Surgery : Do not apply any lotions/deodorants the morning of surgery.  Please wear clean clothes to the hospital/surgery center.  FAILURE TO FOLLOW THESE INSTRUCTIONS MAY RESULT IN THE CANCELLATION OF YOUR SURGERY PATIENT SIGNATURE_________________________________  NURSE SIGNATURE__________________________________  ________________________________________________________________________   Adam Phenix  An incentive spirometer is a  tool that can help keep your lungs clear and active. This tool measures how well you are filling your lungs with each breath. Taking long deep breaths may help reverse or decrease the chance of developing breathing (pulmonary) problems (especially infection) following:  A long period of time when you are unable to move or be active. BEFORE THE PROCEDURE   If the spirometer includes an indicator to show your best effort, your nurse or respiratory therapist will set it to a desired goal.  If possible, sit up straight or lean slightly forward. Try not to slouch.  Hold the incentive spirometer in an upright position. INSTRUCTIONS FOR USE  1. Sit on the edge of your bed if possible, or sit up as far as you can in bed or on a chair. 2. Hold the incentive spirometer in an upright position. 3. Breathe out normally. 4. Place the mouthpiece in your mouth and seal your lips tightly around it. 5. Breathe in slowly and as deeply as possible, raising the piston or the ball toward the top of the column. 6. Hold your breath for 3-5 seconds or for as long as possible. Allow the piston or ball to fall to the bottom of the column. 7. Remove the mouthpiece from your mouth and breathe out normally. 8. Rest for a few seconds and repeat Steps 1 through 7 at least 10 times every 1-2 hours when you are awake. Take your time and take a few normal breaths between deep breaths. 9. The spirometer may include an indicator to show your best effort. Use the indicator as a goal to work toward during each repetition. 10. After each set of 10 deep breaths, practice coughing to be sure your lungs are clear. If you have an incision (the cut made at the time of surgery), support your incision when coughing by placing a pillow or rolled up towels firmly against it. Once you are able to get out of bed, walk around indoors and cough well. You may stop using the incentive spirometer when instructed by your caregiver.  RISKS AND  COMPLICATIONS  Take your time so you do not get dizzy or light-headed.  If you are in pain, you may need to take or ask for pain medication before doing incentive spirometry. It is harder to take a deep breath if you are having  pain. AFTER USE  Rest and breathe slowly and easily.  It can be helpful to keep track of a log of your progress. Your caregiver can provide you with a simple table to help with this. If you are using the spirometer at home, follow these instructions: [place] IF:   You are having difficultly using the spirometer.  You have trouble using the spirometer as often as instructed.  Your pain medication is not giving enough relief while using the spirometer.  You develop fever of 100.5 F (38.1 C) or higher. SEEK IMMEDIATE MEDICAL CARE IF:   You cough up bloody sputum that had not been present before.  You develop fever of 102 F (38.9 C) or greater.  You develop worsening pain at or near the incision site. MAKE SURE YOU:   Understand these instructions.  Will watch your condition.  Will get help right away if you are not doing well or get worse. Document Released: 03/05/2007 Document Revised: 01/15/2012 Document Reviewed: 05/06/2007 ExitCare Patient Information 2014 ExitCare, [place].   ________________________________________________________________________  WHAT IS A BLOOD TRANSFUSION? Blood Transfusion Information  A transfusion is the replacement of blood or some of its parts. Blood is made up of multiple cells which provide different functions.  Red blood cells carry oxygen and are used for blood loss replacement.  White blood cells fight against infection.  Platelets control bleeding.  Plasma helps clot blood.  Other blood products are available for specialized needs, such as hemophilia or other clotting disorders. BEFORE THE TRANSFUSION  Who gives blood for transfusions?   Healthy volunteers who are fully evaluated to make sure  their blood is safe. This is blood bank blood. Transfusion therapy is the safest it has ever been in the practice of medicine. Before blood is taken from a donor, a complete history is taken to make sure that person has no history of diseases nor engages in risky social behavior (examples are intravenous drug use or sexual activity with multiple partners). The donor's travel history is screened to minimize risk of transmitting infections, such as malaria. The donated blood is tested for signs of infectious diseases, such as HIV and hepatitis. The blood is then tested to be sure it is compatible with you in order to minimize the chance of a transfusion reaction. If you or a relative donates blood, this is often done in anticipation of surgery and is not appropriate for emergency situations. It takes many days to process the donated blood. RISKS AND COMPLICATIONS Although transfusion therapy is very safe and saves many lives, the main dangers of transfusion include:   Getting an infectious disease.  Developing a transfusion reaction. This is an allergic reaction to something in the blood you were given. Every precaution is taken to prevent this. The decision to have a blood transfusion has been considered carefully by your caregiver before blood is given. Blood is not given unless the benefits outweigh the risks. AFTER THE TRANSFUSION  Right after receiving a blood transfusion, you will usually feel much better and more energetic. This is especially true if your red blood cells have gotten low (anemic). The transfusion raises the level of the red blood cells which carry oxygen, and this usually causes an energy increase.  The nurse administering the transfusion will monitor you carefully for complications. HOME CARE INSTRUCTIONS  No special instructions are needed after a transfusion. You may find your energy is better. Speak with your caregiver about any limitations on activity for underlying diseases  you may have. SEEK MEDICAL CARE IF:   Your condition is not improving after your transfusion.  You develop redness or irritation at the intravenous (IV) site. SEEK IMMEDIATE MEDICAL CARE IF:  Any of the following symptoms occur over the next 12 hours:  Shaking chills.  You have a temperature by mouth above 102 F (38.9 C), not controlled by medicine.  Chest, back, or muscle pain.  People around you feel you are not acting correctly or are confused.  Shortness of breath or difficulty breathing.  Dizziness and fainting.  You get a rash or develop hives.  You have a decrease in urine output.  Your urine turns a dark color or changes to pink, red, or brown. Any of the following symptoms occur over the next 10 days:  You have a temperature by mouth above 102 F (38.9 C), not controlled by medicine.  Shortness of breath.  Weakness after normal activity.  The white part of the eye turns yellow (jaundice).  You have a decrease in the amount of urine or are urinating less often.  Your urine turns a dark color or changes to pink, red, or brown. Document Released: 10/20/2000 Document Revised: 01/15/2012 Document Reviewed: 06/08/2008 [facility] Patient Information 2014 [place], [place].  _______________________________________________________________________

## 2019-05-23 ENCOUNTER — Other Ambulatory Visit (HOSPITAL_COMMUNITY)
Admission: RE | Admit: 2019-05-23 | Discharge: 2019-05-23 | Disposition: A | Discharge status: Home / Self Care | Payer: Medicare Other | Source: Ambulatory Visit | Attending: Orthopaedic Surgery | Admitting: Orthopaedic Surgery

## 2019-05-23 ENCOUNTER — Encounter (HOSPITAL_COMMUNITY): Payer: Self-pay

## 2019-05-23 ENCOUNTER — Encounter (HOSPITAL_COMMUNITY)
Admission: RE | Admit: 2019-05-23 | Discharge: 2019-05-23 | Disposition: A | Discharge status: Home / Self Care | Payer: Medicare Other | Source: Ambulatory Visit | Attending: Orthopaedic Surgery | Admitting: Orthopaedic Surgery

## 2019-05-23 ENCOUNTER — Other Ambulatory Visit: Payer: Self-pay

## 2019-05-23 ENCOUNTER — Other Ambulatory Visit: Payer: Self-pay | Admitting: Orthopaedic Surgery

## 2019-05-23 ENCOUNTER — Ambulatory Visit (HOSPITAL_COMMUNITY)
Admission: RE | Admit: 2019-05-23 | Discharge: 2019-05-23 | Disposition: A | Discharge status: Home / Self Care | Payer: Medicare Other | Source: Ambulatory Visit | Attending: Orthopaedic Surgery | Admitting: Orthopaedic Surgery

## 2019-05-23 DIAGNOSIS — Z6837 Body mass index (BMI) 37.0-37.9, adult: Secondary | ICD-10-CM | POA: Insufficient documentation

## 2019-05-23 DIAGNOSIS — E785 Hyperlipidemia, unspecified: Secondary | ICD-10-CM | POA: Diagnosis not present

## 2019-05-23 DIAGNOSIS — I2510 Atherosclerotic heart disease of native coronary artery without angina pectoris: Secondary | ICD-10-CM | POA: Insufficient documentation

## 2019-05-23 DIAGNOSIS — Z794 Long term (current) use of insulin: Secondary | ICD-10-CM | POA: Diagnosis not present

## 2019-05-23 DIAGNOSIS — Z01818 Encounter for other preprocedural examination: Secondary | ICD-10-CM

## 2019-05-23 DIAGNOSIS — Z1159 Encounter for screening for other viral diseases: Secondary | ICD-10-CM | POA: Insufficient documentation

## 2019-05-23 DIAGNOSIS — E1122 Type 2 diabetes mellitus with diabetic chronic kidney disease: Secondary | ICD-10-CM | POA: Insufficient documentation

## 2019-05-23 DIAGNOSIS — Z905 Acquired absence of kidney: Secondary | ICD-10-CM | POA: Diagnosis not present

## 2019-05-23 DIAGNOSIS — Z79899 Other long term (current) drug therapy: Secondary | ICD-10-CM | POA: Diagnosis not present

## 2019-05-23 DIAGNOSIS — M1712 Unilateral primary osteoarthritis, left knee: Secondary | ICD-10-CM | POA: Insufficient documentation

## 2019-05-23 DIAGNOSIS — H409 Unspecified glaucoma: Secondary | ICD-10-CM | POA: Diagnosis not present

## 2019-05-23 DIAGNOSIS — E669 Obesity, unspecified: Secondary | ICD-10-CM | POA: Insufficient documentation

## 2019-05-23 DIAGNOSIS — I129 Hypertensive chronic kidney disease with stage 1 through stage 4 chronic kidney disease, or unspecified chronic kidney disease: Secondary | ICD-10-CM | POA: Insufficient documentation

## 2019-05-23 DIAGNOSIS — Z87442 Personal history of urinary calculi: Secondary | ICD-10-CM | POA: Diagnosis not present

## 2019-05-23 DIAGNOSIS — Z8546 Personal history of malignant neoplasm of prostate: Secondary | ICD-10-CM | POA: Insufficient documentation

## 2019-05-23 DIAGNOSIS — G4733 Obstructive sleep apnea (adult) (pediatric): Secondary | ICD-10-CM | POA: Insufficient documentation

## 2019-05-23 DIAGNOSIS — Z955 Presence of coronary angioplasty implant and graft: Secondary | ICD-10-CM | POA: Insufficient documentation

## 2019-05-23 DIAGNOSIS — Z87891 Personal history of nicotine dependence: Secondary | ICD-10-CM | POA: Diagnosis not present

## 2019-05-23 DIAGNOSIS — Z7982 Long term (current) use of aspirin: Secondary | ICD-10-CM | POA: Insufficient documentation

## 2019-05-23 DIAGNOSIS — N189 Chronic kidney disease, unspecified: Secondary | ICD-10-CM | POA: Diagnosis not present

## 2019-05-23 LAB — URINALYSIS, ROUTINE W REFLEX MICROSCOPIC
Bilirubin Urine: NEGATIVE
Glucose, UA: NEGATIVE mg/dL
Hgb urine dipstick: NEGATIVE
Ketones, ur: NEGATIVE mg/dL
Leukocytes,Ua: NEGATIVE
Nitrite: NEGATIVE
Protein, ur: NEGATIVE mg/dL
Specific Gravity, Urine: 1.003 — ABNORMAL LOW (ref 1.005–1.030)
pH: 6 (ref 5.0–8.0)

## 2019-05-23 LAB — BASIC METABOLIC PANEL
Anion gap: 13 (ref 5–15)
BUN: 29 mg/dL — ABNORMAL HIGH (ref 8–23)
CO2: 22 mmol/L (ref 22–32)
Calcium: 9.7 mg/dL (ref 8.9–10.3)
Chloride: 105 mmol/L (ref 98–111)
Creatinine, Ser: 1.81 mg/dL — ABNORMAL HIGH (ref 0.61–1.24)
GFR calc Af Amer: 42 mL/min — ABNORMAL LOW (ref >60)
GFR calc non Af Amer: 36 mL/min — ABNORMAL LOW (ref >60)
Glucose, Bld: 77 mg/dL (ref 70–99)
Potassium: 4 mmol/L (ref 3.5–5.1)
Sodium: 140 mmol/L (ref 135–145)

## 2019-05-23 LAB — CBC WITH DIFFERENTIAL/PLATELET
Abs Immature Granulocytes: 0.04 10*3/uL (ref 0.00–0.07)
Basophils Absolute: 0 10*3/uL (ref 0.0–0.1)
Basophils Relative: 1 %
Eosinophils Absolute: 0.2 10*3/uL (ref 0.0–0.5)
Eosinophils Relative: 2 %
HCT: 40 % (ref 39.0–52.0)
Hemoglobin: 12.4 g/dL — ABNORMAL LOW (ref 13.0–17.0)
Immature Granulocytes: 1 %
Lymphocytes Relative: 30 %
Lymphs Abs: 2.6 10*3/uL (ref 0.7–4.0)
MCH: 28.2 pg (ref 26.0–34.0)
MCHC: 31 g/dL (ref 30.0–36.0)
MCV: 90.9 fL (ref 80.0–100.0)
Monocytes Absolute: 0.6 10*3/uL (ref 0.1–1.0)
Monocytes Relative: 7 %
Neutro Abs: 5.2 10*3/uL (ref 1.7–7.7)
Neutrophils Relative %: 59 %
Platelets: 216 10*3/uL (ref 150–400)
RBC: 4.4 MIL/uL (ref 4.22–5.81)
RDW: 14.5 % (ref 11.5–15.5)
WBC: 8.5 10*3/uL (ref 4.0–10.5)
nRBC: 0 % (ref 0.0–0.2)

## 2019-05-23 LAB — GLUCOSE, CAPILLARY: Glucose-Capillary: 102 mg/dL — ABNORMAL HIGH (ref 70–99)

## 2019-05-23 LAB — HEMOGLOBIN A1C
Hgb A1c MFr Bld: 7.4 % — ABNORMAL HIGH (ref 4.8–5.6)
Mean Plasma Glucose: 165.68 mg/dL

## 2019-05-23 LAB — SURGICAL PCR SCREEN
MRSA, PCR: NEGATIVE
Staphylococcus aureus: NEGATIVE

## 2019-05-23 LAB — SARS CORONAVIRUS 2 (TAT 6-24 HRS): SARS Coronavirus 2: NEGATIVE

## 2019-05-23 LAB — PROTIME-INR
INR: 1 (ref 0.8–1.2)
Prothrombin Time: 13.2 seconds (ref 11.4–15.2)

## 2019-05-23 LAB — APTT: aPTT: 29 seconds (ref 24–36)

## 2019-05-23 NOTE — Care Plan (Signed)
Spoke with patient prior to surgery. He is planning to discharge to home with family and HHPT. He states that he has a rolling walker at home and needs no other equipment.  Referral to [facility] at [facility] and OPPT has been arranged to start after follow up with MD. Patient and MD aware of plan and agreeable.  Choice offered.    Ladell Heads, [place]

## 2019-05-23 NOTE — Progress Notes (Signed)
Pt stays that Dr. Cyndia Bent is aware of thoracic aneurysm measurement. He is scheduled to see Dr. Cyndia Bent next month, as he is on an annual follow-up.

## 2019-05-26 MED ORDER — DEXTROSE 5 % IV SOLN
3.0000 g | INTRAVENOUS | Status: AC
  Administered 2019-05-27: 10:00:00 3 g via INTRAVENOUS
  Filled 2019-05-26: qty 3

## 2019-05-26 MED ORDER — BUPIVACAINE LIPOSOME 1.3 % IJ SUSP
20.0000 mL | Freq: Once | INTRAMUSCULAR | Status: DC
  Filled 2019-05-26: qty 20

## 2019-05-26 MED ORDER — TRANEXAMIC ACID 1000 MG/10ML IV SOLN
2000.0000 mg | INTRAVENOUS | Status: DC
  Filled 2019-05-26: qty 20

## 2019-05-26 NOTE — Progress Notes (Signed)
Anesthesia Chart Review   Case: [id number] Date/Time: 05/27/19 0955   Procedure: Left Knee Arthroplasty (Left )   Anesthesia type: Spinal   Pre-op diagnosis: Left Knee Degenerative joint Disease   Location: [facility] 06 / [facility]   Surgeon: Melrose Nakayama, MD      DISCUSSION:72 y.o. former smoker (quit 06/04/93) with h/o PONV, DM II, CKD (creatinine stable), CAD (s/p PCI 2011), HTN, OSA on CPAP, TAA (followed by Dr. Cyndia Bent, 4.7cm CT chest 5/19), left knee DJD scheduled for above procedure 05/27/2019 with Dr. Melrose Nakayama.   Myocardial perfusion study 02/12/2019, low risk study.  Cardiac clearance received 05/20/2019.  Per Jacob Kicks, NP, "Jacob Berger was last seen on 01/21/19 by Jacob Ferries, PA for Jacob Berger.  He had stress test that was neg for ischemia, also has hx of CAD, Thoracic aortic aneurysm followed by Dr. Cyndia Bent - pt was cleared for surgery but due to covid there is time from test to procedure.  Pt is stable on phone call.  Since that day, Jacob Berger has done well.  Therefore, based on ACC/AHA guidelines, the patient would be at acceptable risk for the planned procedure without further cardiovascular testing. Pt may hold ASA 5-7 days for procedure."    Anticipate pt can proceed with planned procedure barring acute status change.   VS: BP (!) 152/69   Pulse 78   Temp 36.9 C (Oral)   Resp 16   Ht 6' (1.829 m)   Wt 126.1 kg   SpO2 100%   BMI 37.70 kg/m   PROVIDERS: Merrilee Seashore, MD is PCP   Glenetta Hew, MD is Cardiologist   Cyndia Bent, MD is Vascular Surgeon LABS: Labs reviewed: Acceptable for surgery. (all labs ordered are listed, but only abnormal results are displayed)  Labs Reviewed  GLUCOSE, CAPILLARY - Abnormal; Notable for the following components:      Result Value   Glucose-Capillary 102 (*)    All other components within normal limits  BASIC METABOLIC PANEL - Abnormal; Notable for the following components:   BUN 29 (*)    Creatinine,  Ser 1.81 (*)    GFR calc non Af Amer 36 (*)    GFR calc Af Amer 42 (*)    All other components within normal limits  CBC WITH DIFFERENTIAL/PLATELET - Abnormal; Notable for the following components:   Hemoglobin 12.4 (*)    All other components within normal limits  URINALYSIS, ROUTINE W REFLEX MICROSCOPIC - Abnormal; Notable for the following components:   Color, Urine COLORLESS (*)    Specific Gravity, Urine 1.003 (*)    All other components within normal limits  HEMOGLOBIN A1C - Abnormal; Notable for the following components:   Hgb A1c MFr Bld 7.4 (*)    All other components within normal limits  SURGICAL PCR SCREEN  APTT  PROTIME-INR  TYPE AND SCREEN     IMAGES: Chest Xray 05/23/2019 FINDINGS: Lungs are clear. Cardiomediastinal silhouette is within normal. There are degenerative changes of the spine.  IMPRESSION: No active cardiopulmonary disease.  EKG: 01/21/2019 Rate 73 bpm   CV: Stress Test 02/12/16  Nuclear stress EF: 59%.  There was no ST segment deviation noted during stress.  The study is normal.  This is a low risk study.  The left ventricular ejection fraction is normal (55-65%).  No prior images for comparison.  Echo 05/10/16 Study Conclusions  - Left ventricle: The cavity size was normal. There was mild   concentric hypertrophy. Systolic function  was normal. The   estimated ejection fraction was in the range of 60% to 65%. Wall   motion was normal; there were no regional wall motion   abnormalities. There was an increased relative contribution of   atrial contraction to ventricular filling. Doppler parameters are   consistent with abnormal left ventricular relaxation (grade 1   diastolic dysfunction). - Aortic valve: There was mild regurgitation. - Atrial septum: No defect or patent foramen ovale was identified. Past Medical History:  Diagnosis Date  . Arthritis    "knees" (05/10/2016)  . CAD S/P percutaneous coronary angioplasty 2011   a.  2011: s/p PCI to PDA with Endeavor 2.5 mm x 12 mm DES - [facility] b. low-risk NST in 07/2015  . Cancer of kidney ([place])   . Chronic kidney disease   . Daily headache    "recently" (05/10/2016)  . Dyslipidemia, goal LDL below 70   . Glaucoma   . Gout   . History of kidney stones   . Hypertension, essential   . Obesity, Class II, BMI 35-39.9, with comorbidity    BMI 37  . OSA on CPAP   . PONV (postoperative nausea and vomiting)    after colonoscopy  . Prostate cancer ([place]) 2007  . Rash since 04-08-16   on neck healing, right arm improving  . Thoracic aortic aneurysm without rupture ([place]) - Noted on chest CT. 4.5 cm 02/15/2016   4.5 cm per dr Cyndia Bent note and [facility] ct 01-18-16  . Type II diabetes mellitus ([place])     Past Surgical History:  Procedure Laterality Date  . ANTERIOR CERVICAL DECOMP/DISCECTOMY FUSION N/A 11/09/2017   Procedure: Cervical four-five, Cervical five-six Anterior discectomy with fusion and plate fixation;  Surgeon: Ditty, Kevan Ny, MD;  Location: [facility];  Service: Neurosurgery;  Laterality: N/A;  . COLONOSCOPY W/ BIOPSIES AND POLYPECTOMY  "several"  . CORONARY ANGIOPLASTY WITH STENT PLACEMENT  2001   [place]: Endeavor 2.5 mm x 12 mm DES - distal PDA ([facility])  . EYE SURGERY    . LAPAROSCOPIC NEPHRECTOMY Left 04/13/2016   Procedure: LAPAROSCOPIC RADICAL LEFT NEPHRECTOMY;  Surgeon: Raynelle Bring, MD;  Location[facility];  Service: Urology;  Laterality: Left;  . NM MYOVIEW ([place] HX)  6/'13, 3/'16   a. Treadmill Myoview: 10 minutes, 12 METS; no ischemia infarction, EF 65%; b. [facility]: LOW RISK. EF 57%. 10.4 METS low risk scan no ischemia. No wall motion abnormality.   Marland Kitchen NM MYOVIEW LTD  05/2017   EF 50%. Normal function. No ischemia or infarction.  Marland Kitchen PROSTATECTOMY    . REFRACTIVE SURGERY Left     MEDICATIONS: . allopurinol (ZYLOPRIM) 300 MG tablet  . aspirin EC 81 MG tablet  . AZOR 10-40 MG per tablet  . bimatoprost  (LUMIGAN) 0.01 % SOLN  . Carboxymethylcellulose Sodium (REFRESH LIQUIGEL) 1 % GEL  . diphenhydrAMINE (BENADRYL) 25 MG tablet  . diphenhydramine-acetaminophen (TYLENOL PM) 25-500 MG TABS tablet  . Dulaglutide (TRULICITY) 1.5 XT/0.6YI SOPN  . fenofibrate (TRICOR) 145 MG tablet  . furosemide (LASIX) 80 MG tablet  . LEVEMIR FLEXTOUCH 100 UNIT/ML Pen  . Methylcellulose, Laxative, (CITRUCEL) 500 MG TABS  . Multiple Vitamins-Minerals (EMERGEN-C IMMUNE PO)  . nitroGLYCERIN (NITROSTAT) 0.4 MG SL tablet  . NOVOLOG FLEXPEN 100 UNIT/ML FlexPen  . prednisoLONE acetate (PRED FORTE) 1 % ophthalmic suspension  . QUNOL COQ10/UBIQUINOL/MEGA PO  . sildenafil (VIAGRA) 25 MG tablet  . timolol (TIMOPTIC) 0.5 % ophthalmic solution  .  VYTORIN 10-40 MG per tablet   No current facility-administered medications for this encounter.    Marland Kitchen 0.9 %  sodium chloride infusion  . [START ON 05/27/2019] bupivacaine liposome (EXPAREL) 1.3 % injection 266 mg  . [START ON 05/27/2019] ceFAZolin (ANCEF) 3 g in dextrose 5 % 50 mL IVPB  . [START ON 05/27/2019] tranexamic acid (CYKLOKAPRON) 2,000 mg in sodium chloride 0.9 % 50 mL Topical Application    Maia Plan [facility] Pre-Surgical Testing [phone] 05/26/19  4:10 PM

## 2019-05-26 NOTE — H&P (Signed)
TOTAL KNEE ADMISSION H&P  Patient is being admitted for left total knee arthroplasty.  Subjective:  Chief Complaint:left knee pain.  HPI: Jacob Berger, 73 y.o. male, has a history of pain and functional disability in the left knee due to arthritis and has failed non-surgical conservative treatments for greater than 12 weeks to includeNSAID's and/or analgesics, corticosteriod injections, flexibility and strengthening excercises, use of assistive devices, weight reduction as appropriate and activity modification.  Onset of symptoms was gradual, starting 5 years ago with gradually worsening course since that time. The patient noted no past surgery on the left knee(s).  Patient currently rates pain in the left knee(s) at 10 out of 10 with activity. Patient has night pain, worsening of pain with activity and weight bearing, pain that interferes with activities of daily living, crepitus and joint swelling.  Patient has evidence of subchondral cysts, subchondral sclerosis, periarticular osteophytes and joint space narrowing by imaging studies. There is no active infection.  Patient Active Problem List   Diagnosis Date Noted  . ED (erectile dysfunction) of organic origin 08/05/2018  . DJD (degenerative joint disease), cervical 04/22/2018  . Encounter for pre-operative cardiovascular clearance 04/22/2018  . Benign hypertension 03/28/2018  . Coronary artery disease involving native coronary artery of native heart without angina pectoris 03/28/2018  . Cervical spondylosis with myelopathy 11/09/2017  . Chest pain with low risk for cardiac etiology 05/27/2017  . Kidney disease, chronic, stage III (GFR 30-59 ml/min) (HCC)   . Aortic atherosclerosis ([place])   . Gout   . Personal history of kidney cancer 04/13/2016  . Thoracic aortic aneurysm without rupture ([place]) - Noted on chest CT. 4.5 cm 02/15/2016  . Right carpal tunnel syndrome 04/28/2015  . Right cervical radiculopathy 04/28/2015  . Obesity  (BMI 30-39.9) 06/05/2013  . CAD S/P percutaneous coronary angioplasty   . Hypertensive heart disease without CHF   . OSA on CPAP   . Diabetes mellitus, type II, insulin dependent ([place])   . Dyslipidemia, goal LDL below 70    Past Medical History:  Diagnosis Date  . Arthritis    "knees" (05/10/2016)  . CAD S/P percutaneous coronary angioplasty 2011   a. 2011: s/p PCI to PDA with Endeavor 2.5 mm x 12 mm DES - [place] [facility] b. low-risk NST in 07/2015  . Cancer of kidney ([place])   . Chronic kidney disease   . Daily headache    "recently" (05/10/2016)  . Dyslipidemia, goal LDL below 70   . Glaucoma   . Gout   . History of kidney stones   . Hypertension, essential   . Obesity, Class II, BMI 35-39.9, with comorbidity    BMI 37  . OSA on CPAP   . PONV (postoperative nausea and vomiting)    after colonoscopy  . Prostate cancer ([place]) 2007  . Rash since 04-08-16   on neck healing, right arm improving  . Thoracic aortic aneurysm without rupture ([place]) - Noted on chest CT. 4.5 cm 02/15/2016   4.5 cm per dr Cyndia Bent note and [facility] 01-18-16  . Type II diabetes mellitus ([place])     Past Surgical History:  Procedure Laterality Date  . ANTERIOR CERVICAL DECOMP/DISCECTOMY FUSION N/A 11/09/2017   Procedure: Cervical four-five, Cervical five-six Anterior discectomy with fusion and plate fixation;  Surgeon: Ditty, Kevan Ny, MD;  Location: [facility];  Service: Neurosurgery;  Laterality: N/A;  . COLONOSCOPY W/ BIOPSIES AND POLYPECTOMY  "several"  . CORONARY ANGIOPLASTY WITH STENT PLACEMENT  2001  [place]: Endeavor 2.5 mm x 12 mm DES - distal PDA ([facility])  . EYE SURGERY    . LAPAROSCOPIC NEPHRECTOMY Left 04/13/2016   Procedure: LAPAROSCOPIC RADICAL LEFT NEPHRECTOMY;  Surgeon: Raynelle Bring, MD;  [facility];  Service: Urology;  Laterality: Left;  . NM MYOVIEW ([place] HX)  6/'13, 3/'16   a. Treadmill Myoview: 10 minutes, 12 METS; no ischemia infarction, EF 65%;  b. [facility]: LOW RISK. EF 57%. 10.4 METS low risk scan no ischemia. No wall motion abnormality.   Marland Kitchen NM MYOVIEW LTD  05/2017   EF 50%. Normal function. No ischemia or infarction.  Marland Kitchen PROSTATECTOMY    . REFRACTIVE SURGERY Left     Current Facility-Administered Medications  Medication Dose Route Frequency Provider Last Rate Last Dose  . [START ON 05/27/2019] bupivacaine liposome (EXPAREL) 1.3 % injection 266 mg  20 mL Other Once Melrose Nakayama, MD      . Derrill Memo ON 05/27/2019] ceFAZolin (ANCEF) 3 g in dextrose 5 % 50 mL IVPB  3 g Intravenous On Call to [place], [place], [place]      . [START ON 05/27/2019] tranexamic acid (CYKLOKAPRON) 2,000 mg in sodium chloride 0.9 % 50 mL Topical Application  7,425 mg Topical To OR Melrose Nakayama, MD       Current Outpatient Medications  Medication Sig Dispense Refill Last Dose  . allopurinol (ZYLOPRIM) 300 MG tablet Take 300 mg by mouth daily.      Marland Kitchen aspirin EC 81 MG tablet Take 1 tablet (81 mg total) by mouth daily. (Patient taking differently: Take 81 mg by mouth daily. ) 90 tablet 3   . AZOR 10-40 MG per tablet TAKE 1 TABLET BY MOUTH DAILY 90 tablet 2   . bimatoprost (LUMIGAN) 0.01 % SOLN Place 1 drop into both eyes at bedtime.     . Carboxymethylcellulose Sodium (REFRESH LIQUIGEL) 1 % GEL Place 1 drop into the left eye as needed (eye irritation).     . diphenhydrAMINE (BENADRYL) 25 MG tablet Take 1 tablet (25 mg total) by mouth every 6 (six) hours as needed for itching. 20 tablet 0   . diphenhydramine-acetaminophen (TYLENOL PM) 25-500 MG TABS tablet Take 1 tablet by mouth at bedtime as needed (sleep).     . Dulaglutide (TRULICITY) 1.5 ZD/6.3OV SOPN Inject 1.5 mg into the skin every Thursday. In the afternoon 2pm     . fenofibrate (TRICOR) 145 MG tablet Take 145 mg by mouth daily at 2 PM.      . furosemide (LASIX) 80 MG tablet Take 20-40 mg by mouth daily as needed (for fluid).     Ernest Mallick FLEXTOUCH 100 UNIT/ML Pen Inject 43 Units into the skin at bedtime.       . Methylcellulose, Laxative, (CITRUCEL) 500 MG TABS Take 500 mg by mouth daily.      . Multiple Vitamins-Minerals (EMERGEN-C IMMUNE PO) Take 1 tablet by mouth daily.     . nitroGLYCERIN (NITROSTAT) 0.4 MG SL tablet Place 1 tablet (0.4 mg total) under the tongue every 5 (five) minutes as needed for chest pain. 25 tablet 2   . NOVOLOG FLEXPEN 100 UNIT/ML FlexPen Inject 15 Units into the skin 3 (three) times daily before meals.      . prednisoLONE acetate (PRED FORTE) 1 % ophthalmic suspension Place 1 drop into the left eye 6 (six) times daily.     Keene Breath COQ10/UBIQUINOL/MEGA PO Take 20 mLs by mouth 2 (two) times a week.     Marland Kitchen  timolol (TIMOPTIC) 0.5 % ophthalmic solution Place 1 drop into the right eye 2 (two) times daily. Morning and afternoon  5   . VYTORIN 10-40 MG per tablet Take 1 tablet by mouth at bedtime.      . sildenafil (VIAGRA) 25 MG tablet TAKE 1  TO 2 TABLET  AS NEEDED ,FOR ERECTILE DYSFUNCTION 10 tablet 11    Facility-Administered Medications Ordered in Other Encounters  Medication Dose Route Frequency Provider Last Rate Last Dose  . 0.9 %  sodium chloride infusion   Intra-arterial PRN Ditty, Kevan Ny, MD       Allergies  Allergen Reactions  . Accupril [Quinapril Hcl] Swelling    MOUTH SWELLING    Social History   Tobacco Use  . Smoking status: Former Smoker    Types: Cigarettes    Quit date: 06/04/1993    Years since quitting: 25.9  . Smokeless tobacco: Never Used  . Tobacco comment: A PK WOULD LAST A WEEK  Substance Use Topics  . Alcohol use: Yes    Alcohol/week: 2.0 standard drinks    Types: 1 Glasses of wine, 1 Cans of beer per week    Comment: rare    Family History  Problem Relation Age of Onset  . Hypertension Son      Review of Systems  Musculoskeletal: Positive for joint pain.       Left knee  All other systems reviewed and are negative.   Objective:  Physical Exam  Constitutional: He is oriented to person, place, and time. He appears  well-developed and well-nourished.  HENT:  Head: Normocephalic and atraumatic.  Eyes: Pupils are equal, round, and reactive to light.  Neck: Normal range of motion.  Cardiovascular: Normal rate and regular rhythm.  Respiratory: Effort normal.  GI: Soft.  Musculoskeletal:     Comments: Examination of the left knee shows mild effusion.  Range of motion from 0-110 of flexion.  1+ crepitation.  Mild varus deformity.  He has a prominence on the lateral aspect of his patella.  Hip range of motion on the left is full and without pain.  He has some tenderness palpation mostly along his medial joint line.  He is neurovascularly intact distally.   Neurological: He is alert and oriented to person, place, and time.  Skin: Skin is warm and dry.  Psychiatric: He has a normal mood and affect. His behavior is normal. Judgment and thought content normal.    Vital signs in last 24 hours:    Labs:   Estimated body mass index is 37.7 kg/m as calculated from the following:   Height as of 05/23/19: 6' (1.829 m).   Weight as of 05/23/19: 126.1 kg.   Imaging Review Plain radiographs demonstrate severe degenerative joint disease of the left knee(s). The overall alignment ismild varus. The bone quality appears to be good for age and reported activity level.      Assessment/Plan:  End stage arthritis, left knee   The patient history, physical examination, clinical judgment of the provider and imaging studies are consistent with end stage degenerative joint disease of the left knee(s) and total knee arthroplasty is deemed medically necessary. The treatment options including medical management, injection therapy arthroscopy and arthroplasty were discussed at length. The risks and benefits of total knee arthroplasty were presented and reviewed. The risks due to aseptic loosening, infection, stiffness, patella tracking problems, thromboembolic complications and other imponderables were discussed. The patient  acknowledged the explanation, agreed to proceed with the plan and  consent was signed. Patient is being admitted for inpatient treatment for surgery, pain control, PT, OT, prophylactic antibiotics, VTE prophylaxis, progressive ambulation and ADL's and discharge planning. The patient is planning to be discharged home with home health services     Patient's anticipated LOS is less than 2 midnights, meeting these requirements: - Younger than 15 - Lives within 1 hour of care - Has a competent adult at home to recover with post-op recover - NO history of  - Chronic pain requiring opiods  - Diabetes  - Coronary Artery Disease  - Heart failure  - Heart attack  - Stroke  - DVT/VTE  - Cardiac arrhythmia  - Respiratory Failure/COPD  - Renal failure  - Anemia  - Advanced Liver disease

## 2019-05-26 NOTE — Anesthesia Preprocedure Evaluation (Addendum)
Anesthesia Evaluation  Patient identified by MRN, date of birth, ID band Patient awake    Reviewed: Allergy & Precautions, NPO status , Patient's Chart, lab work & pertinent test results  History of Anesthesia Complications (+) PONV and history of anesthetic complications  Airway Mallampati: I  TM Distance: >3 FB Neck ROM: Full    Dental  (+) Chipped,    Pulmonary sleep apnea and Continuous Positive Airway Pressure Ventilation , former smoker,    Pulmonary exam normal breath sounds clear to auscultation       Cardiovascular hypertension, Pt. on medications + CAD and + Cardiac Stents   Rhythm:Regular Rate:Normal  PCI and DES Stent 2011 PDA Hx/o TAA 4.5cm EKG 01/21/2019 NSR Echo 05/10/2016 Left ventricle: The cavity size was normal. There was mild concentric hypertrophy. Systolic function was normal. The estimated ejection fraction was in the range of 60% to 65%. Wall motion was normal; there were no regional wall motion abnormalities. There was an increased relative contribution of  atrial contraction to ventricular filling. Doppler parameters are consistent with abnormal left ventricular relaxation (grade 1 diastolic dysfunction). - Aortic valve: There was mild regurgitation. - Atrial septum: No defect or patent foramen ovale was identified.  Myocardial Perfusion Scan 02/12/2019  Nuclear stress EF: 59%.  There was no ST segment deviation noted during stress.  The study is normal.  This is a low risk study.  The left ventricular ejection fraction is normal (55-65%).  No prior images for comparison   Neuro/Psych  Headaches, Right CTS Right Cervical Radiculopathy Glaucoma  Neuromuscular disease negative psych ROS   GI/Hepatic negative GI ROS, Neg liver ROS,   Endo/Other  diabetes, Well Controlled, Type 2, Insulin DependentObesity Hyperlipidemia  Renal/GU Renal diseaseHx/o renal Ca S/P nephrectomy   ED     Musculoskeletal  (+) Arthritis , Osteoarthritis,  OA left knee   Abdominal (+) + obese,   Peds  Hematology negative hematology ROS (+)   Anesthesia Other Findings   Reproductive/Obstetrics                           Anesthesia Physical Anesthesia Plan  ASA: III  Anesthesia Plan: Spinal   Post-op Pain Management:  Regional for Post-op pain   Induction: Intravenous  PONV Risk Score and Plan: 3 and Midazolam, Ondansetron and Treatment may vary due to age or medical condition  Airway Management Planned: Natural Airway, Nasal Cannula and Simple Face Mask  Additional Equipment:   Intra-op Plan:   Post-operative Plan:   Informed Consent: I have reviewed the patients History and Physical, chart, labs and discussed the procedure including the risks, benefits and alternatives for the proposed anesthesia with the patient or authorized representative who has indicated his/her understanding and acceptance.       Plan Discussed with: CRNA and Surgeon  Anesthesia Plan Comments: (See PAT note 05/23/2019, Konrad Felix, PA-C)       Anesthesia Quick Evaluation

## 2019-05-27 ENCOUNTER — Ambulatory Visit (HOSPITAL_COMMUNITY): Payer: Medicare Other | Admitting: Physician Assistant

## 2019-05-27 ENCOUNTER — Ambulatory Visit (HOSPITAL_COMMUNITY): Payer: Medicare Other | Admitting: Anesthesiology

## 2019-05-27 ENCOUNTER — Other Ambulatory Visit: Payer: Self-pay

## 2019-05-27 ENCOUNTER — Encounter (HOSPITAL_COMMUNITY)
Admission: RE | Disposition: A | Discharge status: Home / Self Care | Payer: Self-pay | Source: Home / Self Care | Attending: Orthopaedic Surgery

## 2019-05-27 ENCOUNTER — Encounter (HOSPITAL_COMMUNITY): Payer: Self-pay

## 2019-05-27 ENCOUNTER — Inpatient Hospital Stay (HOSPITAL_COMMUNITY)
Admission: RE | Admit: 2019-05-27 | Discharge: 2019-05-30 | DRG: 470 | Disposition: A | Discharge status: Home / Self Care | Payer: Medicare Other | Attending: Orthopaedic Surgery | Admitting: Orthopaedic Surgery

## 2019-05-27 DIAGNOSIS — Z981 Arthrodesis status: Secondary | ICD-10-CM | POA: Diagnosis not present

## 2019-05-27 DIAGNOSIS — H409 Unspecified glaucoma: Secondary | ICD-10-CM | POA: Diagnosis not present

## 2019-05-27 DIAGNOSIS — Z85528 Personal history of other malignant neoplasm of kidney: Secondary | ICD-10-CM

## 2019-05-27 DIAGNOSIS — Z794 Long term (current) use of insulin: Secondary | ICD-10-CM | POA: Diagnosis not present

## 2019-05-27 DIAGNOSIS — E669 Obesity, unspecified: Secondary | ICD-10-CM | POA: Diagnosis present

## 2019-05-27 DIAGNOSIS — Z87891 Personal history of nicotine dependence: Secondary | ICD-10-CM

## 2019-05-27 DIAGNOSIS — E119 Type 2 diabetes mellitus without complications: Secondary | ICD-10-CM | POA: Diagnosis not present

## 2019-05-27 DIAGNOSIS — Z6837 Body mass index (BMI) 37.0-37.9, adult: Secondary | ICD-10-CM | POA: Diagnosis not present

## 2019-05-27 DIAGNOSIS — Z8546 Personal history of malignant neoplasm of prostate: Secondary | ICD-10-CM

## 2019-05-27 DIAGNOSIS — G4733 Obstructive sleep apnea (adult) (pediatric): Secondary | ICD-10-CM | POA: Diagnosis not present

## 2019-05-27 DIAGNOSIS — E785 Hyperlipidemia, unspecified: Secondary | ICD-10-CM | POA: Diagnosis not present

## 2019-05-27 DIAGNOSIS — I2510 Atherosclerotic heart disease of native coronary artery without angina pectoris: Secondary | ICD-10-CM | POA: Diagnosis present

## 2019-05-27 DIAGNOSIS — Z7982 Long term (current) use of aspirin: Secondary | ICD-10-CM

## 2019-05-27 DIAGNOSIS — M109 Gout, unspecified: Secondary | ICD-10-CM | POA: Diagnosis present

## 2019-05-27 DIAGNOSIS — Z905 Acquired absence of kidney: Secondary | ICD-10-CM | POA: Diagnosis not present

## 2019-05-27 DIAGNOSIS — Z955 Presence of coronary angioplasty implant and graft: Secondary | ICD-10-CM

## 2019-05-27 DIAGNOSIS — Z8249 Family history of ischemic heart disease and other diseases of the circulatory system: Secondary | ICD-10-CM

## 2019-05-27 DIAGNOSIS — I10 Essential (primary) hypertension: Secondary | ICD-10-CM | POA: Diagnosis present

## 2019-05-27 DIAGNOSIS — M1712 Unilateral primary osteoarthritis, left knee: Principal | ICD-10-CM | POA: Diagnosis present

## 2019-05-27 DIAGNOSIS — G8918 Other acute postprocedural pain: Secondary | ICD-10-CM | POA: Diagnosis not present

## 2019-05-27 DIAGNOSIS — Z792 Long term (current) use of antibiotics: Secondary | ICD-10-CM | POA: Diagnosis not present

## 2019-05-27 HISTORY — PX: TOTAL KNEE ARTHROPLASTY: SHX125

## 2019-05-27 LAB — GLUCOSE, CAPILLARY
Glucose-Capillary: 116 mg/dL — ABNORMAL HIGH (ref 70–99)
Glucose-Capillary: 123 mg/dL — ABNORMAL HIGH (ref 70–99)
Glucose-Capillary: 93 mg/dL (ref 70–99)
Glucose-Capillary: 158 mg/dL — ABNORMAL HIGH (ref 70–99)

## 2019-05-27 LAB — TYPE AND SCREEN
ABO/RH(D): O POS
Antibody Screen: NEGATIVE

## 2019-05-27 LAB — TROPONIN I (HIGH SENSITIVITY): Troponin I (High Sensitivity): 3 ng/L (ref <18)

## 2019-05-27 SURGERY — ARTHROPLASTY, KNEE, TOTAL
Anesthesia: Spinal | Site: Knee | Laterality: Left

## 2019-05-27 MED ORDER — NITROGLYCERIN 0.4 MG SL SUBL: 0.4000 mg | SUBLINGUAL_TABLET | SUBLINGUAL | Status: DC | PRN

## 2019-05-27 MED ORDER — PHENOL 1.4 % MT LIQD: 1.0000 | OROMUCOSAL | Status: DC | PRN

## 2019-05-27 MED ORDER — FENTANYL CITRATE (PF) 100 MCG/2ML IJ SOLN
INTRAMUSCULAR | Status: AC
  Administered 2019-05-27: 09:00:00 50 ug via INTRAVENOUS
  Filled 2019-05-27: qty 2

## 2019-05-27 MED ORDER — ACETAMINOPHEN 500 MG PO TABS
500.0000 mg | ORAL_TABLET | Freq: Four times a day (QID) | ORAL | Status: AC
  Administered 2019-05-28: 500 mg via ORAL
  Filled 2019-05-27: qty 1

## 2019-05-27 MED ORDER — DULAGLUTIDE 1.5 MG/0.5ML ~~LOC~~ SOAJ: 1.5000 mg | SUBCUTANEOUS | Status: DC

## 2019-05-27 MED ORDER — TIMOLOL MALEATE 0.5 % OP SOLN
1.0000 [drp] | Freq: Two times a day (BID) | OPHTHALMIC | Status: DC
  Administered 2019-05-27 – 2019-05-30 (×7): 1 [drp] via OPHTHALMIC
  Filled 2019-05-27: qty 5

## 2019-05-27 MED ORDER — METOCLOPRAMIDE HCL 5 MG/ML IJ SOLN
5.0000 mg | Freq: Three times a day (TID) | INTRAMUSCULAR | Status: DC | PRN
  Administered 2019-05-28: 15:00:00 10 mg via INTRAVENOUS
  Filled 2019-05-27: qty 2

## 2019-05-27 MED ORDER — METHYLCELLULOSE (LAXATIVE) 500 MG PO TABS: 500.0000 mg | ORAL_TABLET | Freq: Every day | ORAL | Status: DC

## 2019-05-27 MED ORDER — ASPIRIN 81 MG PO CHEW
81.0000 mg | CHEWABLE_TABLET | Freq: Two times a day (BID) | ORAL | Status: DC
  Administered 2019-05-27 – 2019-05-30 (×6): 81 mg via ORAL
  Filled 2019-05-27 (×6): qty 1

## 2019-05-27 MED ORDER — BUPIVACAINE LIPOSOME 1.3 % IJ SUSP
INTRAMUSCULAR | Status: DC | PRN
  Administered 2019-05-27: 20 mL

## 2019-05-27 MED ORDER — LACTATED RINGERS IV SOLN
INTRAVENOUS | Status: DC
  Administered 2019-05-27: 18:00:00 via INTRAVENOUS

## 2019-05-27 MED ORDER — SODIUM CHLORIDE (PF) 0.9 % IJ SOLN
INTRAMUSCULAR | Status: DC | PRN
  Administered 2019-05-27: 30 mL

## 2019-05-27 MED ORDER — PREDNISOLONE ACETATE 1 % OP SUSP
1.0000 [drp] | Freq: Every day | OPHTHALMIC | Status: DC
  Administered 2019-05-27 – 2019-05-30 (×17): 1 [drp] via OPHTHALMIC
  Filled 2019-05-27: qty 5

## 2019-05-27 MED ORDER — DIPHENHYDRAMINE HCL 12.5 MG/5ML PO ELIX: 12.5000 mg | ORAL_SOLUTION | ORAL | Status: DC | PRN

## 2019-05-27 MED ORDER — BISACODYL 5 MG PO TBEC: 5.0000 mg | DELAYED_RELEASE_TABLET | Freq: Every day | ORAL | Status: DC | PRN

## 2019-05-27 MED ORDER — AMLODIPINE-OLMESARTAN 10-40 MG PO TABS: 1.0000 | ORAL_TABLET | Freq: Every day | ORAL | Status: DC

## 2019-05-27 MED ORDER — DOCUSATE SODIUM 100 MG PO CAPS
100.0000 mg | ORAL_CAPSULE | Freq: Two times a day (BID) | ORAL | Status: DC
  Administered 2019-05-27 – 2019-05-30 (×6): 100 mg via ORAL
  Filled 2019-05-27 (×6): qty 1

## 2019-05-27 MED ORDER — PROPOFOL 10 MG/ML IV BOLUS
INTRAVENOUS | Status: AC
  Filled 2019-05-27: qty 20

## 2019-05-27 MED ORDER — METHOCARBAMOL 500 MG IVPB - SIMPLE MED
500.0000 mg | Freq: Four times a day (QID) | INTRAVENOUS | Status: DC | PRN
  Filled 2019-05-27: qty 50

## 2019-05-27 MED ORDER — HYDROMORPHONE HCL 1 MG/ML IJ SOLN: 0.2500 mg | INTRAMUSCULAR | Status: DC | PRN

## 2019-05-27 MED ORDER — PROPOFOL 10 MG/ML IV BOLUS
INTRAVENOUS | Status: AC
  Filled 2019-05-27: qty 40

## 2019-05-27 MED ORDER — HYDROCODONE-ACETAMINOPHEN 5-325 MG PO TABS
1.0000 | ORAL_TABLET | ORAL | Status: DC | PRN
  Administered 2019-05-27 – 2019-05-28 (×5): 2 via ORAL
  Administered 2019-05-29: 1 via ORAL
  Administered 2019-05-29 (×2): 2 via ORAL
  Administered 2019-05-30 (×2): 1 via ORAL
  Filled 2019-05-27 (×2): qty 2
  Filled 2019-05-27: qty 1
  Filled 2019-05-27 (×4): qty 2
  Filled 2019-05-27: qty 1
  Filled 2019-05-27 (×2): qty 2

## 2019-05-27 MED ORDER — 0.9 % SODIUM CHLORIDE (POUR BTL) OPTIME
TOPICAL | Status: DC | PRN
  Administered 2019-05-27: 1000 mL

## 2019-05-27 MED ORDER — AMLODIPINE BESYLATE 10 MG PO TABS
10.0000 mg | ORAL_TABLET | Freq: Every day | ORAL | Status: DC
  Administered 2019-05-28 – 2019-05-30 (×3): 10 mg via ORAL
  Filled 2019-05-27 (×3): qty 1

## 2019-05-27 MED ORDER — INSULIN DETEMIR 100 UNIT/ML ~~LOC~~ SOLN
43.0000 [IU] | Freq: Every day | SUBCUTANEOUS | Status: DC
  Administered 2019-05-27 – 2019-05-29 (×3): 43 [IU] via SUBCUTANEOUS
  Filled 2019-05-27 (×4): qty 0.43

## 2019-05-27 MED ORDER — TRANEXAMIC ACID-NACL 1000-0.7 MG/100ML-% IV SOLN
1000.0000 mg | INTRAVENOUS | Status: AC
  Administered 2019-05-27: 11:00:00 1000 mg via INTRAVENOUS

## 2019-05-27 MED ORDER — MIDAZOLAM HCL 2 MG/2ML IJ SOLN
INTRAMUSCULAR | Status: AC
  Administered 2019-05-27: 09:00:00 1 mg via INTRAVENOUS
  Filled 2019-05-27: qty 2

## 2019-05-27 MED ORDER — INSULIN ASPART 100 UNIT/ML ~~LOC~~ SOLN
0.0000 [IU] | Freq: Three times a day (TID) | SUBCUTANEOUS | Status: DC
  Administered 2019-05-28: 4 [IU] via SUBCUTANEOUS
  Administered 2019-05-28: 18:00:00 7 [IU] via SUBCUTANEOUS
  Administered 2019-05-28: 09:00:00 3 [IU] via SUBCUTANEOUS
  Administered 2019-05-29: 7 [IU] via SUBCUTANEOUS
  Administered 2019-05-29: 3 [IU] via SUBCUTANEOUS
  Administered 2019-05-29: 18:00:00 7 [IU] via SUBCUTANEOUS
  Administered 2019-05-30 (×2): 4 [IU] via SUBCUTANEOUS

## 2019-05-27 MED ORDER — PHENYLEPHRINE HCL (PRESSORS) 10 MG/ML IV SOLN
INTRAVENOUS | Status: AC
  Filled 2019-05-27: qty 1

## 2019-05-27 MED ORDER — BUPIVACAINE-EPINEPHRINE (PF) 0.5% -1:200000 IJ SOLN
INTRAMUSCULAR | Status: DC | PRN
  Administered 2019-05-27: 30 mL via PERINEURAL

## 2019-05-27 MED ORDER — METOCLOPRAMIDE HCL 5 MG PO TABS: 5.0000 mg | ORAL_TABLET | Freq: Three times a day (TID) | ORAL | Status: DC | PRN

## 2019-05-27 MED ORDER — MENTHOL 3 MG MT LOZG: 1.0000 | LOZENGE | OROMUCOSAL | Status: DC | PRN

## 2019-05-27 MED ORDER — TRANEXAMIC ACID 1000 MG/10ML IV SOLN
INTRAVENOUS | Status: DC | PRN
  Administered 2019-05-27: 2000 mg via TOPICAL

## 2019-05-27 MED ORDER — MIDAZOLAM HCL 2 MG/2ML IJ SOLN
1.0000 mg | INTRAMUSCULAR | Status: DC
  Administered 2019-05-27: 09:00:00 1 mg via INTRAVENOUS

## 2019-05-27 MED ORDER — ONDANSETRON HCL 4 MG/2ML IJ SOLN
INTRAMUSCULAR | Status: DC | PRN
  Administered 2019-05-27: 4 mg via INTRAVENOUS

## 2019-05-27 MED ORDER — ACETAMINOPHEN 325 MG PO TABS
325.0000 mg | ORAL_TABLET | Freq: Four times a day (QID) | ORAL | Status: DC | PRN
  Administered 2019-05-28: 650 mg via ORAL
  Filled 2019-05-27: qty 2

## 2019-05-27 MED ORDER — LACTATED RINGERS IV SOLN: INTRAVENOUS | Status: DC

## 2019-05-27 MED ORDER — SODIUM CHLORIDE (PF) 0.9 % IJ SOLN
INTRAMUSCULAR | Status: AC
  Filled 2019-05-27: qty 50

## 2019-05-27 MED ORDER — HYDROCODONE-ACETAMINOPHEN 7.5-325 MG PO TABS: 1.0000 | ORAL_TABLET | ORAL | Status: DC | PRN

## 2019-05-27 MED ORDER — FENOFIBRATE 160 MG PO TABS
160.0000 mg | ORAL_TABLET | Freq: Every day | ORAL | Status: DC
  Administered 2019-05-27 – 2019-05-30 (×4): 160 mg via ORAL
  Filled 2019-05-27 (×4): qty 1

## 2019-05-27 MED ORDER — INSULIN ASPART 100 UNIT/ML ~~LOC~~ SOLN
15.0000 [IU] | Freq: Three times a day (TID) | SUBCUTANEOUS | Status: DC
  Administered 2019-05-28 – 2019-05-30 (×8): 15 [IU] via SUBCUTANEOUS

## 2019-05-27 MED ORDER — CHLORHEXIDINE GLUCONATE 4 % EX LIQD: 60.0000 mL | Freq: Once | CUTANEOUS | Status: DC

## 2019-05-27 MED ORDER — LACTATED RINGERS IV SOLN
INTRAVENOUS | Status: DC
  Administered 2019-05-27: 09:00:00 via INTRAVENOUS

## 2019-05-27 MED ORDER — PROPOFOL 500 MG/50ML IV EMUL
INTRAVENOUS | Status: DC | PRN
  Administered 2019-05-27: 75 ug/kg/min via INTRAVENOUS

## 2019-05-27 MED ORDER — POVIDONE-IODINE 10 % EX SWAB: 2.0000 "application " | Freq: Once | CUTANEOUS | Status: DC

## 2019-05-27 MED ORDER — BUPIVACAINE-EPINEPHRINE (PF) 0.25% -1:200000 IJ SOLN
INTRAMUSCULAR | Status: DC | PRN
  Administered 2019-05-27: 30 mL

## 2019-05-27 MED ORDER — FUROSEMIDE 20 MG PO TABS: 20.0000 mg | ORAL_TABLET | Freq: Every day | ORAL | Status: DC | PRN

## 2019-05-27 MED ORDER — ONDANSETRON HCL 4 MG PO TABS
4.0000 mg | ORAL_TABLET | Freq: Four times a day (QID) | ORAL | Status: DC | PRN
  Administered 2019-05-29 – 2019-05-30 (×2): 4 mg via ORAL
  Filled 2019-05-27 (×2): qty 1

## 2019-05-27 MED ORDER — METHOCARBAMOL 500 MG PO TABS
500.0000 mg | ORAL_TABLET | Freq: Four times a day (QID) | ORAL | Status: DC | PRN
  Administered 2019-05-27 – 2019-05-30 (×6): 500 mg via ORAL
  Filled 2019-05-27 (×6): qty 1

## 2019-05-27 MED ORDER — TRANEXAMIC ACID-NACL 1000-0.7 MG/100ML-% IV SOLN
INTRAVENOUS | Status: AC
  Filled 2019-05-27: qty 100

## 2019-05-27 MED ORDER — METHOCARBAMOL 1000 MG/10ML IJ SOLN
750.0000 mg | Freq: Once | INTRAVENOUS | Status: AC
  Administered 2019-05-27: 750 mg via INTRAVENOUS
  Filled 2019-05-27: qty 7.5

## 2019-05-27 MED ORDER — EZETIMIBE-SIMVASTATIN 10-40 MG PO TABS
1.0000 | ORAL_TABLET | Freq: Every day | ORAL | Status: DC
  Administered 2019-05-27 – 2019-05-29 (×3): 1 via ORAL
  Filled 2019-05-27 (×4): qty 1

## 2019-05-27 MED ORDER — STERILE WATER FOR IRRIGATION IR SOLN
Status: DC | PRN
  Administered 2019-05-27: 2000 mL

## 2019-05-27 MED ORDER — METOCLOPRAMIDE HCL 5 MG/ML IJ SOLN: 10.0000 mg | Freq: Once | INTRAMUSCULAR | Status: DC | PRN

## 2019-05-27 MED ORDER — MEPERIDINE HCL 50 MG/ML IJ SOLN: 6.2500 mg | INTRAMUSCULAR | Status: DC | PRN

## 2019-05-27 MED ORDER — BUPIVACAINE-EPINEPHRINE (PF) 0.25% -1:200000 IJ SOLN
INTRAMUSCULAR | Status: AC
  Filled 2019-05-27: qty 30

## 2019-05-27 MED ORDER — ALLOPURINOL 300 MG PO TABS
300.0000 mg | ORAL_TABLET | Freq: Every day | ORAL | Status: DC
  Administered 2019-05-28 – 2019-05-30 (×3): 300 mg via ORAL
  Filled 2019-05-27 (×3): qty 1

## 2019-05-27 MED ORDER — MORPHINE SULFATE (PF) 2 MG/ML IV SOLN
0.5000 mg | INTRAVENOUS | Status: DC | PRN
  Administered 2019-05-28: 0.5 mg via INTRAVENOUS
  Filled 2019-05-27: qty 1

## 2019-05-27 MED ORDER — LATANOPROST 0.005 % OP SOLN
1.0000 [drp] | Freq: Every day | OPHTHALMIC | Status: DC
  Administered 2019-05-27: 23:00:00 1 [drp] via OPHTHALMIC
  Filled 2019-05-27: qty 2.5

## 2019-05-27 MED ORDER — FENTANYL CITRATE (PF) 100 MCG/2ML IJ SOLN
50.0000 ug | INTRAMUSCULAR | Status: DC
  Administered 2019-05-27: 09:00:00 50 ug via INTRAVENOUS

## 2019-05-27 MED ORDER — TRANEXAMIC ACID-NACL 1000-0.7 MG/100ML-% IV SOLN
1000.0000 mg | Freq: Once | INTRAVENOUS | Status: AC
  Administered 2019-05-27: 1000 mg via INTRAVENOUS
  Filled 2019-05-27: qty 100

## 2019-05-27 MED ORDER — PROPOFOL 500 MG/50ML IV EMUL
INTRAVENOUS | Status: DC | PRN
  Administered 2019-05-27: 50 mg via INTRAVENOUS

## 2019-05-27 MED ORDER — ONDANSETRON HCL 4 MG/2ML IJ SOLN
INTRAMUSCULAR | Status: AC
  Filled 2019-05-27: qty 2

## 2019-05-27 MED ORDER — ONDANSETRON HCL 4 MG/2ML IJ SOLN
4.0000 mg | Freq: Four times a day (QID) | INTRAMUSCULAR | Status: DC | PRN
  Administered 2019-05-27 – 2019-05-28 (×2): 4 mg via INTRAVENOUS
  Filled 2019-05-27 (×3): qty 2

## 2019-05-27 MED ORDER — ALUM & MAG HYDROXIDE-SIMETH 200-200-20 MG/5ML PO SUSP: 30.0000 mL | ORAL | Status: DC | PRN

## 2019-05-27 MED ORDER — CEFAZOLIN SODIUM-DEXTROSE 2-4 GM/100ML-% IV SOLN
2.0000 g | Freq: Four times a day (QID) | INTRAVENOUS | Status: AC
  Administered 2019-05-27 (×2): 2 g via INTRAVENOUS
  Filled 2019-05-27 (×2): qty 100

## 2019-05-27 MED ORDER — IRBESARTAN 150 MG PO TABS
300.0000 mg | ORAL_TABLET | Freq: Every day | ORAL | Status: DC
  Administered 2019-05-28 – 2019-05-30 (×3): 300 mg via ORAL
  Filled 2019-05-27 (×3): qty 2

## 2019-05-27 MED ORDER — SODIUM CHLORIDE 0.9 % IR SOLN
Status: DC | PRN
  Administered 2019-05-27: 3000 mL

## 2019-05-27 MED ORDER — SODIUM CHLORIDE 0.9 % IV SOLN
INTRAVENOUS | Status: DC | PRN
  Administered 2019-05-27: 10:00:00 25 ug/min via INTRAVENOUS

## 2019-05-27 MED ORDER — BUPIVACAINE IN DEXTROSE 0.75-8.25 % IT SOLN
INTRATHECAL | Status: DC | PRN
  Administered 2019-05-27: 2 mL via INTRATHECAL

## 2019-05-27 SURGICAL SUPPLY — 51 items
BAG DECANTER FOR FLEXI CONT (MISCELLANEOUS) ×3 IMPLANT
BAG SPEC THK2 15X12 ZIP CLS (MISCELLANEOUS) ×1
BAG ZIPLOCK 12X15 (MISCELLANEOUS) ×3 IMPLANT
BLADE SAGITTAL 25.0X1.19X90 (BLADE) ×2 IMPLANT
BLADE SAGITTAL 25.0X1.19X90MM (BLADE) ×1
BLADE SAW SGTL 13.0X1.19X90.0M (BLADE) ×3 IMPLANT
BNDG ELASTIC 6X5.8 VLCR STR LF (GAUZE/BANDAGES/DRESSINGS) ×5 IMPLANT
BOOTIES KNEE HIGH SLOAN (MISCELLANEOUS) ×3 IMPLANT
BOWL SMART MIX CTS (DISPOSABLE) ×3 IMPLANT
CEMENT HV SMART SET (Cement) ×6 IMPLANT
COMP FEM CEM LRG LT LCS (Orthopedic Implant) ×3 IMPLANT
COMPONENT FEM CEM LRG LT LCS (Orthopedic Implant) IMPLANT
COVER SURGICAL LIGHT HANDLE (MISCELLANEOUS) ×3 IMPLANT
COVER WAND RF STERILE (DRAPES) IMPLANT
CUFF TOURN SGL QUICK 34 (TOURNIQUET CUFF) ×3
CUFF TRNQT CYL 34X4.125X (TOURNIQUET CUFF) ×1 IMPLANT
DECANTER SPIKE VIAL GLASS SM (MISCELLANEOUS) ×6 IMPLANT
DRAPE SHEET LG 3/4 BI-LAMINATE (DRAPES) ×3 IMPLANT
DRAPE TOP 10253 STERILE (DRAPES) ×3 IMPLANT
DRAPE U-SHAPE 47X51 STRL (DRAPES) ×3 IMPLANT
DRSG AQUACEL AG ADV 3.5X10 (GAUZE/BANDAGES/DRESSINGS) ×3 IMPLANT
DURAPREP 26ML APPLICATOR (WOUND CARE) ×6 IMPLANT
ELECT REM PT RETURN 15FT ADLT (MISCELLANEOUS) ×3 IMPLANT
GLOVE BIO SURGEON STRL SZ8 (GLOVE) ×6 IMPLANT
GLOVE BIOGEL PI IND STRL 8 (GLOVE) ×2 IMPLANT
GLOVE BIOGEL PI INDICATOR 8 (GLOVE) ×4
GOWN STRL REUS W/TWL XL LVL3 (GOWN DISPOSABLE) ×6 IMPLANT
HANDPIECE INTERPULSE COAX TIP (DISPOSABLE) ×3
HOLDER FOLEY CATH W/STRAP (MISCELLANEOUS) IMPLANT
HOOD PEEL AWAY FLYTE STAYCOOL (MISCELLANEOUS) ×9 IMPLANT
INSERT TIB LCS RP LRG 15 (Knees) ×2 IMPLANT
KIT TURNOVER KIT A (KITS) IMPLANT
MANIFOLD NEPTUNE II (INSTRUMENTS) ×3 IMPLANT
NS IRRIG 1000ML POUR BTL (IV SOLUTION) ×3 IMPLANT
PACK TOTAL KNEE CUSTOM (KITS) ×3 IMPLANT
PAD ARMBOARD 7.5X6 YLW CONV (MISCELLANEOUS) ×3 IMPLANT
PATELLA DOME PFC 35MM (Knees) ×2 IMPLANT
PIN STEINMAN FIXATION KNEE (PIN) ×2 IMPLANT
PROTECTOR NERVE ULNAR (MISCELLANEOUS) ×3 IMPLANT
SET HNDPC FAN SPRY TIP SCT (DISPOSABLE) ×1 IMPLANT
SUT VIC AB 0 CT1 36 (SUTURE) ×3 IMPLANT
SUT VIC AB 1 CT1 27 (SUTURE) ×3
SUT VIC AB 1 CT1 27XBRD ANTBC (SUTURE) ×2 IMPLANT
SUT VIC AB 2-0 CT1 27 (SUTURE) ×3
SUT VIC AB 2-0 CT1 TAPERPNT 27 (SUTURE) ×1 IMPLANT
SUT VIC AB 3-0 CT1 27 (SUTURE) ×3
SUT VIC AB 3-0 CT1 TAPERPNT 27 (SUTURE) ×1 IMPLANT
TRAY FOLEY MTR SLVR 16FR STAT (SET/KITS/TRAYS/PACK) IMPLANT
TRAY TIB SZ 5 REVISION (Knees) ×2 IMPLANT
WATER STERILE IRR 1000ML POUR (IV SOLUTION) ×3 IMPLANT
WRAP KNEE MAXI GEL POST OP (GAUZE/BANDAGES/DRESSINGS) ×3 IMPLANT

## 2019-05-27 NOTE — Anesthesia Procedure Notes (Signed)
Anesthesia Regional Block: Adductor canal block   Pre-Anesthetic Checklist: ,, timeout performed, Correct Patient, Correct Site, Correct Laterality, Correct Procedure, Correct Position, site marked, Risks and benefits discussed,  Surgical consent,  Pre-op evaluation,  At surgeon's request and post-op pain management  Laterality: Left  Prep: chloraprep       Needles:  Injection technique: Single-shot  Needle Type: Echogenic Stimulator Needle     Needle Length: 9cm  Needle Gauge: 21   Needle insertion depth: 7 cm   Additional Needles:   Procedures:,,,, ultrasound used (permanent image in chart),,,,  Narrative:  Start time: 05/27/2019 9:25 AM End time: 05/27/2019 9:30 AM Injection made incrementally with aspirations every 5 mL.  Performed by: Personally  Anesthesiologist: Josephine Igo, MD  Additional Notes: Timeout performed. Patient sedated. Relevant anatomy ID'd using [place]. Incremental 2-66ml injection of LA with frequent aspiration. Patient tolerated procedure well.        Left Adductor Canal Block

## 2019-05-27 NOTE — Anesthesia Procedure Notes (Signed)
Date/Time: 05/27/2019 9:50 AM Performed by: Glory Buff, CRNA Oxygen Delivery Method: Nasal cannula

## 2019-05-27 NOTE — Interval H&P Note (Signed)
History and Physical Interval Note:  05/27/2019 9:08 AM  Jacob Berger  has presented today for surgery, with the diagnosis of Left Knee Degenerative joint Disease.  The various methods of treatment have been discussed with the patient and family. After consideration of risks, benefits and other options for treatment, the patient has consented to  Procedure(s): Left Knee Arthroplasty (Left) as a surgical intervention.  The patient's history has been reviewed, patient examined, no change in status, stable for surgery.  I have reviewed the patient's chart and labs.  Questions were answered to the patient's satisfaction.     [name]

## 2019-05-27 NOTE — Op Note (Signed)
PREOP DIAGNOSIS: DJD LEFT KNEE POSTOP DIAGNOSIS:  same PROCEDURE: LEFT TKR ANESTHESIA: Spinal and MAC ATTENDING SURGEON: [name] ASSISTANT: Loni Dolly PA  INDICATIONS FOR PROCEDURE: Jacob Berger is a 73 y.o. male who has struggled for a long time with pain due to degenerative arthritis of the left knee.  The patient has failed many conservative non-operative measures and at this point has pain which limits the ability to sleep and walk.  The patient is offered total knee replacement.  Informed operative consent was obtained after discussion of possible risks of anesthesia, infection, neurovascular injury, DVT, and death.  The importance of the post-operative rehabilitation protocol to optimize result was stressed extensively with the patient.  SUMMARY OF FINDINGS AND PROCEDURE:  Justin Meisenheimer Loughney was taken to the operative suite where under the above anesthesia a left knee replacement was performed.  There were advanced degenerative changes and the bone quality was excellent.  We used the DePuyLCS system and placed size large femur, 5MBT tibia, 35 mm all polyethylene patella, and a size 15 mm spacer.  Loni Dolly PA-C assisted throughout and was invaluable to the completion of the case in that he helped retract and maintain exposure while I placed the components.  He also helped close thereby minimizing OR time.  The patient was admitted for appropriate post-op care to include perioperative antibiotics and mechanical and pharmacologic measures for DVT prophylaxis.  DESCRIPTION OF PROCEDURE:  Khylen Riolo Popson was taken to the operative suite where the above anesthesia was applied.  The patient was positioned supine and prepped and draped in normal sterile fashion.  An appropriate time out was performed.  After the administration of kefzol pre-op antibiotic the leg was elevated and exsanguinated and a tourniquet inflated.  A standard longitudinal incision was made on the anterior knee.   Dissection was carried down to the extensor mechanism.  All appropriate anti-infective measures were used including the pre-operative antibiotic, betadine impregnated drape, and closed hooded exhaust systems for each member of the surgical team.  A medial parapatellar incision was made in the extensor mechanism and the knee cap flipped and the knee flexed.  Some residual meniscal tissues were removed along with any remaining ACL/PCL tissue.  A guide was placed on the tibia and a flat cut was made on it's superior surface.  An intramedullary guide was placed in the femur and was utilized to make anterior and posterior cuts creating an appropriate flexion gap.  A second intramedullary guide was placed in the femur to make a distal cut properly balancing the knee with an extension gap equal to the flexion gap.  The three bones sized to the above mentioned sizes and the appropriate guides were placed and utilized.  A trial reduction was done and the knee easily came to full extension and the patella tracked well on flexion.  The trial components were removed and all bones were cleaned with pulsatile lavage and then dried thoroughly.  Cement was mixed and was pressurized onto the bones followed by placement of the aforementioned components.  Excess cement was trimmed and pressure was held on the components until the cement had hardened.  The tourniquet was deflated and a small amount of bleeding was controlled with cautery and pressure.  The knee was irrigated thoroughly.  The extensor mechanism was re-approximated with #1 ethibond in interrupted fashion.  The knee was flexed and the repair was solid.  The subcutaneous tissues were re-approximated with #0 and #2-0 vicryl and the skin  closed with a subcuticular stitch and steristrips.  A sterile dressing was applied.  Intraoperative fluids, EBL, and tourniquet time can be obtained from anesthesia records.  DISPOSITION:  The patient was taken to recovery room in stable  condition and admitted for appropriate post-op care to include peri-operative antibiotic and DVT prophylaxis with mechanical and pharmacologic measures.  [name] 05/27/2019, 11:37 AM

## 2019-05-27 NOTE — Care Plan (Signed)
Ortho Bundle Case Management Note  Patient Details  Name: Jacob Berger MRN: [id number] Date of Birth: 1945-11-16   Spoke with patient prior to surgery. He is planning to discharge to home with family and HHPT. He states that he has a rolling walker at home and needs no other equipment.  Referral to [facility] and OPPT has been arranged to start after follow up with MD. Patient and MD aware of plan and agreeable.  Choice offered.                   DME Arranged:    DME Agency:     HH Arranged:  PT HH Agency:  [facility] (formerly [facility])  Additional Comments: Please contact me with any questions of if this plan should need to change.  Ladell Heads,  [place] [facility]ist  [phone] 05/27/2019, 8:48 AM

## 2019-05-27 NOTE — Evaluation (Addendum)
Physical Therapy Evaluation Patient Details Name: Jacob Berger MRN: [id number] DOB: 1946-02-20 Today's Date: 05/27/2019   History of Present Illness  L TKA; PMH of ACDF C4-5 and C5-6 11/09/17. Pt reports L eye droop is 2* recent cataract surgery.   Clinical Impression  Pt is s/p TKA resulting in the deficits listed below (see PT Problem List). Min assist for bed mobility and for stand pivot transfer to recliner. Ambulation deferred 2* nausea/dizziness. BP 114/65, HR 57, SaO2 97% on room air. Good progress expected.  Pt will benefit from skilled PT to increase their independence and safety with mobility to allow discharge to the venue listed below.      Follow Up Recommendations Follow surgeon's recommendation for DC plan and follow-up therapies    Equipment Recommendations  None recommended by PT    Recommendations for Other Services       Precautions / Restrictions Precautions Precautions: Knee Precaution Comments: Reviewed no pillow under knee Restrictions Weight Bearing Restrictions: No Other Position/Activity Restrictions: WBAT      Mobility  Bed Mobility Overal bed mobility: Needs Assistance Bed Mobility: Supine to Sit     Supine to sit: Min assist     General bed mobility comments: assist to raise trunk and support LLE  Transfers Overall transfer level: Needs assistance Equipment used: Rolling walker (2 wheeled) Transfers: Sit to/from [facility] Sit to Stand: Min assist;From elevated surface Stand pivot transfers: Min guard       General transfer comment: VCs hand placement and sequencing, pt took a few pivotal steps bed to recliner, ambulation deferred 2* dizziness, BP 114/65 in sitting, HR 57, SaO2 97% room air, RN notified of pt request for nausea medication  Ambulation/Gait                Stairs            Wheelchair Mobility    Modified Rankin (Stroke Patients Only)       Balance Overall balance assessment:  Modified Independent                                           Pertinent Vitals/Pain Pain Assessment: 0-10 Pain Score: 5  Pain Location: L knee Pain Descriptors / Indicators: Sore Pain Intervention(s): Limited activity within patient's tolerance;Monitored during session;Ice applied;Premedicated before session    Home Living Family/patient expects to be discharged to:: Private residence Living Arrangements: Spouse/significant other Available Help at Discharge: Family   Home Access: Stairs to enter Entrance Stairs-Rails: Right Entrance Stairs-Number of Steps: [place]: Environmental consultant - 2 wheels;Bedside commode      Prior Function Level of Independence: Independent with assistive device(s)               Hand Dominance        Extremity/Trunk Assessment   Upper Extremity Assessment Upper Extremity Assessment: Overall WFL for tasks assessed    Lower Extremity Assessment Lower Extremity Assessment: LLE deficits/detail LLE Deficits / Details: SLR 3/5, knee AAROM 5-45* LLE Sensation: WNL    Cervical / Trunk Assessment Cervical / Trunk Assessment: Normal  Communication   Communication: No difficulties  Cognition Arousal/Alertness: Awake/alert Behavior During Therapy: WFL for tasks assessed/performed Overall Cognitive Status: Within Functional Limits for tasks assessed  General Comments      Exercises Total Joint Exercises Ankle Circles/Pumps: AROM;Both;10 reps;Supine Heel Slides: AAROM;Left;5 reps;Supine Long Arc Quad: AAROM;Left;5 reps;Seated Goniometric ROM: 5-45* AAROM L knee   Assessment/Plan    PT Assessment Patient needs continued PT services  PT Problem List Decreased strength;Decreased range of motion;Decreased activity tolerance;Pain;Decreased mobility       PT Treatment Interventions DME instruction;Gait training;Functional mobility training;Therapeutic  activities;Therapeutic exercise;Patient/family education    PT Goals (Current goals can be found in the Care Plan section)  Acute Rehab PT Goals Patient Stated Goal: play golf PT Goal Formulation: With patient Time For Goal Achievement: 06/03/19 Potential to Achieve Goals: Good    Frequency 7X/week   Barriers to discharge        Co-evaluation               AM-PAC PT "6 Clicks" Mobility  Outcome Measure Help needed turning from your back to your side while in a flat bed without using bedrails?: A Little Help needed moving from lying on your back to sitting on the side of a flat bed without using bedrails?: A Little Help needed moving to and from a bed to a chair (including a wheelchair)?: A Little Help needed standing up from a chair using your arms (e.g., wheelchair or bedside chair)?: A Little Help needed to walk in hospital room?: A Little Help needed climbing 3-5 steps with a railing? : A Lot 6 Click Score: 17    End of Session Equipment Utilized During Treatment: Gait belt Activity Tolerance: Treatment limited secondary to medical complications (Comment)(nausea and dizziness) Patient left: in chair;with call bell/phone within reach;with nursing/sitter in room Nurse Communication: Mobility status;Other (comment)(pt nauseous and dizzy) PT Visit Diagnosis: Difficulty in walking, not elsewhere classified (R26.2);Pain;Muscle weakness (generalized) (M62.81) Pain - Right/Left: Left Pain - part of body: Knee    Time: 2641-5830 PT Time Calculation (min) (ACUTE ONLY): 33 min   Charges:   PT Evaluation $PT Eval Low Complexity: 1 Low PT Treatments $Therapeutic Activity: 8-22 mins       Blondell Reveal Kistler PT 05/27/2019  Acute Rehabilitation Services Pager [phone] Office [phone]

## 2019-05-27 NOTE — Anesthesia Postprocedure Evaluation (Signed)
Anesthesia Post Note  Patient: Jacob Berger  Procedure(s) Performed: Left Knee Arthroplasty (Left Knee)     Patient location during evaluation: PACU Anesthesia Type: Spinal Level of consciousness: awake and alert and oriented Pain management: pain level controlled Vital Signs Assessment: post-procedure vital signs reviewed and stable Respiratory status: spontaneous breathing, nonlabored ventilation, respiratory function stable and patient connected to nasal cannula oxygen Cardiovascular status: blood pressure returned to baseline and stable Postop Assessment: no apparent nausea or vomiting, spinal receding, patient able to bend at knees, no headache and no backache Anesthetic complications: no Comments: Patient complained of chest pain in PACU. 12 lead EKG obtained and unchanged from preop. Troponin high sensitivity 3.0 (negative). Ok to discharge to floor.    Last Vitals:  Vitals:   05/27/19 1300 05/27/19 1315  BP: 118/72 127/72  Pulse: 70 65  Resp: 13 11  Temp:    SpO2: 98% 99%    Last Pain:  Vitals:   05/27/19 1330  TempSrc:   PainSc: 5                  Tayshun Gappa A.

## 2019-05-27 NOTE — Progress Notes (Signed)
Dr Royce Macadamia notified of lab results, orders received for Robaxin, Dr foster reports pt does NOT need telemetry.

## 2019-05-27 NOTE — Anesthesia Procedure Notes (Signed)
Spinal  Patient location during procedure: OR Start time: 05/27/2019 9:54 AM End time: 05/27/2019 9:57 AM Staffing Anesthesiologist: Josephine Igo, MD Resident/CRNA: Glory Buff, CRNA Performed: resident/CRNA  Preanesthetic Checklist Completed: patient identified, site marked, surgical consent, pre-op evaluation, timeout performed, IV checked, risks and benefits discussed and monitors and equipment checked Spinal Block Patient position: sitting Prep: DuraPrep Patient monitoring: heart rate, cardiac monitor, continuous pulse ox and blood pressure Approach: midline Location: L3-4 Injection technique: single-shot Needle Needle type: Pencan  Needle gauge: 24 G Needle length: 9 cm Assessment Sensory level: T6 Additional Notes Kit expiration date checked and verified.  Sterile prep and drape, Skin local with 1% lidocaine, stick x 1, - paraesthesia, - heme, + CSF pre and post injection, patient tolerated procedure well.

## 2019-05-27 NOTE — Progress Notes (Signed)
AssistedDr. Foster with left, ultrasound guided, adductor canal block. Side rails up, monitors on throughout procedure. See vital signs in flow sheet. Tolerated Procedure well.  

## 2019-05-27 NOTE — Progress Notes (Signed)
Dr Royce Macadamia at bedside,pt complaining of chest pressure 6/10, EKG done and reviewed, troponin ordered.  Pain currently easing

## 2019-05-27 NOTE — Transfer of Care (Signed)
Immediate Anesthesia Transfer of Care Note  Patient: Jawann Urbani Donoghue  Procedure(s) Performed: Left Knee Arthroplasty (Left Knee)  Patient Location: PACU  Anesthesia Type:MAC and Spinal  Level of Consciousness: drowsy, patient cooperative and responds to stimulation  Airway & Oxygen Therapy: Patient Spontanous Breathing and Patient connected to nasal cannula oxygen  Post-op Assessment: Report given to RN and Post -op Vital signs reviewed and stable  Post vital signs: Reviewed and stable  Last Vitals:  Vitals Value Taken Time  BP    Temp    Pulse 78 05/27/19 1157  Resp 14 05/27/19 1159  SpO2 96 % 05/27/19 1157  Vitals shown include unvalidated device data.  Last Pain:  Vitals:   05/27/19 0932  TempSrc:   PainSc: 0-No pain         Complications: No apparent anesthesia complications

## 2019-05-28 ENCOUNTER — Encounter (HOSPITAL_COMMUNITY): Payer: Self-pay | Admitting: Orthopaedic Surgery

## 2019-05-28 ENCOUNTER — Other Ambulatory Visit: Payer: Self-pay

## 2019-05-28 DIAGNOSIS — Z87891 Personal history of nicotine dependence: Secondary | ICD-10-CM | POA: Diagnosis not present

## 2019-05-28 DIAGNOSIS — Z905 Acquired absence of kidney: Secondary | ICD-10-CM | POA: Diagnosis not present

## 2019-05-28 DIAGNOSIS — E669 Obesity, unspecified: Secondary | ICD-10-CM | POA: Diagnosis present

## 2019-05-28 DIAGNOSIS — M109 Gout, unspecified: Secondary | ICD-10-CM | POA: Diagnosis present

## 2019-05-28 DIAGNOSIS — I2510 Atherosclerotic heart disease of native coronary artery without angina pectoris: Secondary | ICD-10-CM | POA: Diagnosis present

## 2019-05-28 DIAGNOSIS — Z981 Arthrodesis status: Secondary | ICD-10-CM | POA: Diagnosis not present

## 2019-05-28 DIAGNOSIS — M1712 Unilateral primary osteoarthritis, left knee: Secondary | ICD-10-CM | POA: Diagnosis present

## 2019-05-28 DIAGNOSIS — Z8249 Family history of ischemic heart disease and other diseases of the circulatory system: Secondary | ICD-10-CM | POA: Diagnosis not present

## 2019-05-28 DIAGNOSIS — Z7982 Long term (current) use of aspirin: Secondary | ICD-10-CM | POA: Diagnosis not present

## 2019-05-28 DIAGNOSIS — I10 Essential (primary) hypertension: Secondary | ICD-10-CM | POA: Diagnosis present

## 2019-05-28 DIAGNOSIS — Z794 Long term (current) use of insulin: Secondary | ICD-10-CM | POA: Diagnosis not present

## 2019-05-28 DIAGNOSIS — Z6837 Body mass index (BMI) 37.0-37.9, adult: Secondary | ICD-10-CM | POA: Diagnosis not present

## 2019-05-28 DIAGNOSIS — G4733 Obstructive sleep apnea (adult) (pediatric): Secondary | ICD-10-CM | POA: Diagnosis present

## 2019-05-28 DIAGNOSIS — Z8546 Personal history of malignant neoplasm of prostate: Secondary | ICD-10-CM | POA: Diagnosis not present

## 2019-05-28 DIAGNOSIS — E785 Hyperlipidemia, unspecified: Secondary | ICD-10-CM | POA: Diagnosis present

## 2019-05-28 DIAGNOSIS — Z955 Presence of coronary angioplasty implant and graft: Secondary | ICD-10-CM | POA: Diagnosis not present

## 2019-05-28 DIAGNOSIS — Z85528 Personal history of other malignant neoplasm of kidney: Secondary | ICD-10-CM | POA: Diagnosis not present

## 2019-05-28 DIAGNOSIS — H409 Unspecified glaucoma: Secondary | ICD-10-CM | POA: Diagnosis present

## 2019-05-28 LAB — GLUCOSE, CAPILLARY
Glucose-Capillary: 142 mg/dL — ABNORMAL HIGH (ref 70–99)
Glucose-Capillary: 164 mg/dL — ABNORMAL HIGH (ref 70–99)
Glucose-Capillary: 210 mg/dL — ABNORMAL HIGH (ref 70–99)
Glucose-Capillary: 192 mg/dL — ABNORMAL HIGH (ref 70–99)

## 2019-05-28 NOTE — Progress Notes (Signed)
Physical Therapy Treatment Patient Details Name: Jacob Berger MRN: [id number] DOB: 02-Dec-1945 Today's Date: 05/28/2019    History of Present Illness L TKA; PMH of ACDF C4-5 and C5-6 11/09/17. Pt reports his L eye droop is due recent cataract surgery    PT Comments    Min assist for supine to sit and for sit to stand. Pt took several pivotal steps to recliner with RW, activity tolerance limited by nausea. Pt received medication for nausea at end of PT session, will return to attempt ambulation. Pt stated his wife will be out of town Friday-Sunday. His family is working on arranging 24* assistance for him at home.   Follow Up Recommendations  Follow surgeon's recommendation for DC plan and follow-up therapies     Equipment Recommendations  None recommended by PT    Recommendations for Other Services       Precautions / Restrictions Precautions Precautions: Knee Precaution Comments: Reviewed no pillow under knee Restrictions Weight Bearing Restrictions: No Other Position/Activity Restrictions: WBAT    Mobility  Bed Mobility Overal bed mobility: Needs Assistance Bed Mobility: Supine to Sit     Supine to sit: Min assist     General bed mobility comments: assist to raise trunk and support LLE  Transfers Overall transfer level: Needs assistance Equipment used: Rolling walker (2 wheeled) Transfers: Sit to/from [facility] Sit to Stand: Min assist;From elevated surface Stand pivot transfers: Min guard       General transfer comment: VCs hand placement and sequencing, pt took a few pivotal steps bed to recliner, ambulation deferred 2*nausea, RN notified of pt request for nausea medication  Ambulation/Gait                 Stairs             Wheelchair Mobility    Modified Rankin (Stroke Patients Only)       Balance Overall balance assessment: Modified Independent                                           Cognition Arousal/Alertness: Awake/alert Behavior During Therapy: WFL for tasks assessed/performed Overall Cognitive Status: Within Functional Limits for tasks assessed                                        Exercises Total Joint Exercises Knee Flexion: AAROM;AROM;Left;10 reps;Seated    General Comments        Pertinent Vitals/Pain Pain Score: 6  Pain Location: L knee Pain Descriptors / Indicators: Sore Pain Intervention(s): Limited activity within patient's tolerance;Monitored during session;Premedicated before session;Ice applied    Home Living                      Prior Function            PT Goals (current goals can now be found in the care plan section) Acute Rehab PT Goals Patient Stated Goal: play golf PT Goal Formulation: With patient Time For Goal Achievement: 06/03/19 Potential to Achieve Goals: Good Progress towards PT goals: Progressing toward goals    Frequency    7X/week      PT Plan Current plan remains appropriate    Co-evaluation              AM-PAC  PT "6 Clicks" Mobility   Outcome Measure  Help needed turning from your back to your side while in a flat bed without using bedrails?: A Little Help needed moving from lying on your back to sitting on the side of a flat bed without using bedrails?: A Little Help needed moving to and from a bed to a chair (including a wheelchair)?: A Little Help needed standing up from a chair using your arms (e.g., wheelchair or bedside chair)?: A Little Help needed to walk in hospital room?: A Little Help needed climbing 3-5 steps with a railing? : A Lot 6 Click Score: 17    End of Session Equipment Utilized During Treatment: Gait belt Activity Tolerance: Treatment limited secondary to medical complications (Comment)(nausea and dizziness) Patient left: in chair;with call bell/phone within reach;with nursing/sitter in room Nurse Communication: Mobility status;Other (comment)(pt  nauseous) PT Visit Diagnosis: Difficulty in walking, not elsewhere classified (R26.2);Pain;Muscle weakness (generalized) (M62.81) Pain - Right/Left: Left Pain - part of body: Knee     Time: 3300-7622 PT Time Calculation (min) (ACUTE ONLY): 13 min  Charges:  $Therapeutic Activity: 8-22 mins                     Blondell Reveal Kistler PT 05/28/2019  Acute Rehabilitation Services Pager [phone] Office [phone]

## 2019-05-28 NOTE — Progress Notes (Signed)
Physical Therapy Treatment Patient Details Name: Jacob Berger MRN: [id number] DOB: 1946/08/18 Today's Date: 05/28/2019    History of Present Illness Pt is a 73 y/o male s/p L TKA on 05/27/19. PMH of ACDF C4-5 and C5-6 11/09/17. Pt reports his L eye droop is due recent cataract surgery    PT Comments    Pt is slowly progressing with mobility. With his first attempt at sit to stand he had an uncontrolled descent into the recliner. His second attempt was successful. He ambulated 64' with RW this afternoon. Pt stated his wife cannot be home with him over the weekend, she has to assist a sick aunt. He has not yet been able to arrange for 24* assist at home over the weekend.    Follow Up Recommendations  Follow surgeon's recommendation for DC plan and follow-up therapies     Equipment Recommendations  None recommended by PT    Recommendations for Other Services       Precautions / Restrictions Precautions Precautions: Knee Precaution Booklet Issued: Yes (comment) Precaution Comments: Reviewed no pillow under knee Restrictions Weight Bearing Restrictions: No LLE Weight Bearing: Weight bearing as tolerated Other Position/Activity Restrictions: WBAT    Mobility  Bed Mobility Overal bed mobility: Needs Assistance Bed Mobility: Supine to Sit     Supine to sit: Min assist     General bed mobility comments: up in recliner  Transfers Overall transfer level: Needs assistance Equipment used: Rolling walker (2 wheeled) Transfers: Sit to/from Stand Sit to Stand: Min assist;From elevated surface Stand pivot transfers: Min guard       General transfer comment: VCs hand placement and sequencing, pt was unable to achieve full stand with 1st attempt at sit to stand, he had uncontrolled descent back into recliner, with second attempt he was successful  Ambulation/Gait Ambulation/Gait assistance: Min guard Gait Distance (Feet): 40 Feet Assistive device: Rolling walker (2  wheeled) Gait Pattern/deviations: Step-to pattern;Decreased stride length Gait velocity: decr   General Gait Details: VCs sequencing, no loss of balance   Stairs             Wheelchair Mobility    Modified Rankin (Stroke Patients Only)       Balance Overall balance assessment: Modified Independent                                          Cognition Arousal/Alertness: Awake/alert Behavior During Therapy: WFL for tasks assessed/performed Overall Cognitive Status: Within Functional Limits for tasks assessed                                        Exercises Total Joint Exercises  Long Arc QuadSinclair Ship;Left;10 reps;Seated Knee Flexion: AAROM;Left;10 reps;Seated Goniometric ROM: 5-50* AAROM L knee    General Comments        Pertinent Vitals/Pain Pain Assessment: 0-10 Pain Score: 6  Pain Location: L knee Pain Descriptors / Indicators: Sore Pain Intervention(s): Limited activity within patient's tolerance;Monitored during session;Premedicated before session;Ice applied    Home Living                      Prior Function            PT Goals (current goals can now be found in the care plan section) Acute Rehab  PT Goals Patient Stated Goal: play golf PT Goal Formulation: With patient Time For Goal Achievement: 06/03/19 Potential to Achieve Goals: Good Progress towards PT goals: Progressing toward goals    Frequency    7X/week      PT Plan Current plan remains appropriate    Co-evaluation              AM-PAC PT "6 Clicks" Mobility   Outcome Measure  Help needed turning from your back to your side while in a flat bed without using bedrails?: A Little Help needed moving from lying on your back to sitting on the side of a flat bed without using bedrails?: A Little Help needed moving to and from a bed to a chair (including a wheelchair)?: A Little Help needed standing up from a chair using your arms (e.g.,  wheelchair or bedside chair)?: A Little Help needed to walk in hospital room?: A Little Help needed climbing 3-5 steps with a railing? : A Lot 6 Click Score: 17    End of Session Equipment Utilized During Treatment: Gait belt Activity Tolerance: Patient tolerated treatment well Patient left: in chair;with call bell/phone within reach Nurse Communication: Mobility status PT Visit Diagnosis: Difficulty in walking, not elsewhere classified (R26.2);Pain;Muscle weakness (generalized) (M62.81) Pain - Right/Left: Left Pain - part of body: Knee     Time: 7672-0947 PT Time Calculation (min) (ACUTE ONLY): 30 min  Charges:  $Gait Training: 8-22 mins $Therapeutic Exercise: 8-22 mins                      Blondell Reveal Kistler PT 05/28/2019  Acute Rehabilitation Services Pager [phone] Office [phone]

## 2019-05-28 NOTE — Plan of Care (Signed)
  Problem: Education: °Goal: Knowledge of the prescribed therapeutic regimen will improve °Outcome: Progressing °  °Problem: Activity: °Goal: Range of joint motion will improve °Outcome: Progressing °  °Problem: Clinical Measurements: °Goal: Postoperative complications will be avoided or minimized °Outcome: Progressing °  °Problem: Pain Management: °Goal: Pain level will decrease with appropriate interventions °Outcome: Progressing °  °

## 2019-05-28 NOTE — Progress Notes (Signed)
Subjective: 1 Day Post-Op Procedure(s) (LRB): Left Knee Arthroplasty (Left)   Patient resting comfortably in bed. He has not been up with PT yet as he felt nauseated yesterday.  Activity level:  wbat Diet tolerance:  ok Voiding:  Foley out this morning Patient reports pain as mild.    Objective: Vital signs in last 24 hours: Temp:  [97.4 F (36.3 C)-98.7 F (37.1 C)] 98.7 F (37.1 C) (07/22 0537) Pulse Rate:  [61-80] 75 (07/22 0537) Resp:  [8-22] 18 (07/22 0537) BP: (105-149)/(62-84) 148/68 (07/22 0537) SpO2:  [95 %-100 %] 97 % (07/22 0537)  Labs: No results for input(s): HGB in the last 72 hours. No results for input(s): WBC, RBC, HCT, PLT in the last 72 hours. No results for input(s): NA, K, CL, CO2, BUN, CREATININE, GLUCOSE, CALCIUM in the last 72 hours. No results for input(s): LABPT, INR in the last 72 hours.  Physical Exam:  Neurologically intact ABD soft Neurovascular intact Sensation intact distally Intact pulses distally Dorsiflexion/Plantar flexion intact Incision: dressing C/D/I and no drainage No cellulitis present Compartment soft  Assessment/Plan:  1 Day Post-Op Procedure(s) (LRB): Left Knee Arthroplasty (Left) Advance diet Up with therapy Discharge home with home health once he is up and stable with PT. I changed bandage out this morning. It may need to be changed or reinforced if he continues to slowly bleed into it. I will see him back tomorrow morning.  Follow up in office 2 weeks post op. Continue on ASA 81mg  BID x 2 weeks post op for DVT prevention.  Larwance Sachs Joannie Medine 05/28/2019, 8:05 AM

## 2019-05-28 NOTE — Progress Notes (Signed)
Physical Therapy Progress Note  Clinical Impression: Pt seen for LE strengthening exercises. PT provided HEP handout and reviewed in full with pt. PT provided pt education re: ice, positioning, generalized walking program and car transfers with demonstration. Pt would continue to benefit from skilled physical therapy services at this time while admitted and after d/c to address the below listed limitations in order to improve overall safety and independence with functional mobility.  Sherie Don, [place], DPT  Acute Rehabilitation Services Pager [phone] Office [phone]    05/28/19 1100  PT Visit Information  Last PT Received On 05/28/19  Assistance Needed +1  History of Present Illness Pt is a 73 y/o male s/p L TKA on 05/27/19. PMH of ACDF C4-5 and C5-6 11/09/17. Pt reports his L eye droop is due recent cataract surgery  Precautions  Precautions Knee  Precaution Booklet Issued Yes (comment)  Precaution Comments PT reviewed positioning of LE following knee surgery with pt  Restrictions  Weight Bearing Restrictions Yes  LLE Weight Bearing WBAT  Pain Assessment  Pain Assessment 0-10  Pain Score 8  Pain Location L knee  Pain Descriptors / Indicators Sore  Pain Intervention(s) Monitored during session;Repositioned  Cognition  Arousal/Alertness Awake/alert  Behavior During Therapy WFL for tasks assessed/performed  Overall Cognitive Status Within Functional Limits for tasks assessed  Exercises  Exercises Total Joint  Total Joint Exercises  Ankle Circles/Pumps AROM;Both;10 reps;Supine  Heel Slides AAROM;Left;10 reps;Supine  Quad Sets AROM;Strengthening;Left;10 reps;Supine  Short Arc Quad AAROM;Left;10 reps;Supine  Hip ABduction/ADduction AAROM;Left;10 reps;Supine  Straight Leg Raises AAROM;Left;10 reps;Supine  PT - End of Session  Activity Tolerance Patient tolerated treatment well  Patient left in bed;with call bell/phone within reach;with bed alarm set;Other (comment) (PT  entering to mobilize pt)  Nurse Communication Mobility status   PT - Assessment/Plan  PT Plan Current plan remains appropriate  PT Visit Diagnosis Other abnormalities of gait and mobility (R26.89);Pain  Pain - Right/Left Left  Pain - part of body Knee  PT Frequency (ACUTE ONLY) 7X/week  Follow Up Recommendations Supervision/Assistance - 24 hour;Home health PT  PT equipment None recommended by PT  AM-PAC PT "6 Clicks" Mobility Outcome Measure (Version 2)  Help needed turning from your back to your side while in a flat bed without using bedrails? 3  Help needed moving from lying on your back to sitting on the side of a flat bed without using bedrails? 3  Help needed moving to and from a bed to a chair (including a wheelchair)? 3  Help needed standing up from a chair using your arms (e.g., wheelchair or bedside chair)? 3  Help needed to walk in hospital room? 3  Help needed climbing 3-5 steps with a railing?  2  6 Click Score 17  Consider Recommendation of Discharge To: Home with [facility]  PT Goal Progression  Progress towards PT goals Progressing toward goals  Acute Rehab PT Goals  PT Goal Formulation With patient  Time For Goal Achievement 06/03/19  Potential to Achieve Goals Good  PT Time Calculation  PT Start Time (ACUTE ONLY) 1023  PT Stop Time (ACUTE ONLY) 1035  PT Time Calculation (min) (ACUTE ONLY) 12 min  PT General Charges  $$ ACUTE PT VISIT 1 Visit  PT Treatments  $Therapeutic Exercise 8-22 mins

## 2019-05-29 LAB — GLUCOSE, CAPILLARY
Glucose-Capillary: 137 mg/dL — ABNORMAL HIGH (ref 70–99)
Glucose-Capillary: 243 mg/dL — ABNORMAL HIGH (ref 70–99)
Glucose-Capillary: 211 mg/dL — ABNORMAL HIGH (ref 70–99)
Glucose-Capillary: 184 mg/dL — ABNORMAL HIGH (ref 70–99)

## 2019-05-29 NOTE — Progress Notes (Signed)
Subjective: 2 Days Post-Op Procedure(s) (LRB): Left Knee Arthroplasty (Left)   Patient feels a little better this morning. Still has occasional nausea. He states that he found someone to stay with him this weekend while his wife is out of town but they wont be available until tomorrow.   Activity level:  wbat Diet tolerance:  ok Voiding:  No bowel movement yet but passing urine Patient reports pain as mild.    Objective: Vital signs in last 24 hours: Temp:  [98 F (36.7 C)-100.2 F (37.9 C)] 99 F (37.2 C) (07/23 0521) Pulse Rate:  [69-92] 86 (07/23 0521) Resp:  [17] 17 (07/23 0521) BP: (149-165)/(66-81) 160/74 (07/23 0521) SpO2:  [93 %-99 %] 99 % (07/23 0521)  Labs: No results for input(s): HGB in the last 72 hours. No results for input(s): WBC, RBC, HCT, PLT in the last 72 hours. No results for input(s): NA, K, CL, CO2, BUN, CREATININE, GLUCOSE, CALCIUM in the last 72 hours. No results for input(s): LABPT, INR in the last 72 hours.  Physical Exam:  Neurologically intact ABD soft Neurovascular intact Sensation intact distally Intact pulses distally Dorsiflexion/Plantar flexion intact Incision: dressing C/D/I and no drainage No cellulitis present Compartment soft  Assessment/Plan:  2 Days Post-Op Procedure(s) (LRB): Left Knee Arthroplasty (Left) Advance diet Up with therapy Discharge home with home health tomorrow if doing well and cleared by PT. He has found help for the weekend at home but they wont be available until tomorrow. Continue on 81mg  ASA BID x  2 weeks post op for DVT prevention Follow up in office 2 weeks post op.  Larwance Sachs Jarell Mcewen 05/29/2019, 6:44 AM

## 2019-05-29 NOTE — Plan of Care (Signed)
  Problem: Education: Goal: Knowledge of the prescribed therapeutic regimen will improve Outcome: Progressing   Problem: Activity: Goal: Range of joint motion will improve Outcome: Progressing   Problem: Pain Management: Goal: Pain level will decrease with appropriate interventions Outcome: Progressing   

## 2019-05-29 NOTE — Progress Notes (Addendum)
Physical Therapy Treatment Patient Details Name: Jacob Berger MRN: [id number] DOB: 09-24-1946 Today's Date: 05/29/2019    History of Present Illness L TKA    PT Comments    Pt is slowly progressing with mobility. He ambulated 51' with RW, distance limited by L knee pain and general fatigue. Pt reports he has a caregiver arranged starting tomorrow.   Follow Up Recommendations  Follow surgeon's recommendation for DC plan and follow-up therapies     Equipment Recommendations  Rolling walker with 5" wheels;3in1 (PT)    Recommendations for Other Services       Precautions / Restrictions Precautions Precautions: Knee Precaution Booklet Issued: Yes (comment) Precaution Comments: reviewed positioning of LE following knee surgery with pt Restrictions Weight Bearing Restrictions: No LLE Weight Bearing: Weight bearing as tolerated    Mobility  Bed Mobility Overal bed mobility: Needs Assistance Bed Mobility: Supine to Sit     Supine to sit: Mod assist;HOB elevated     General bed mobility comments: assist to raise trunk and support LLE  Transfers Overall transfer level: Needs assistance Equipment used: Rolling walker (2 wheeled) Transfers: Sit to/from Stand Sit to Stand: Min assist;From elevated surface         General transfer comment: VCs hand placement and sequencing  Ambulation/Gait Ambulation/Gait assistance: Min guard Gait Distance (Feet): 44 Feet Assistive device: Rolling walker (2 wheeled) Gait Pattern/deviations: Step-to pattern;Decreased stride length;Decreased weight shift to left Gait velocity: decr   General Gait Details: heavy reliance upon RW for UE support 2* L knee pain with WB   Stairs             Wheelchair Mobility    Modified Rankin (Stroke Patients Only)       Balance                                            Cognition Arousal/Alertness: Awake/alert Behavior During Therapy: WFL for tasks  assessed/performed Overall Cognitive Status: Within Functional Limits for tasks assessed                                        Exercises  General Exercises - Lower Extremity Long Arc QuadSinclair Berger;Left;10 reps;Seated    General Comments        Pertinent Vitals/Pain Pain Assessment: 0-10 Pain Score: 4  Pain Location: L knee Pain Descriptors / Indicators: Sore Pain Intervention(s): Limited activity within patient's tolerance;Monitored during session;Premedicated before session;Ice applied    Home Living                      Prior Function            PT Goals (current goals can now be found in the care plan section) Acute Rehab PT Goals Patient Stated Goal: play golf PT Goal Formulation: With patient Time For Goal Achievement: 06/03/19 Potential to Achieve Goals: Good Progress towards PT goals: Progressing toward goals    Frequency    7X/week      PT Plan Current plan remains appropriate    Co-evaluation              AM-PAC PT "6 Clicks" Mobility   Outcome Measure  Help needed turning from your back to your side while in a flat bed without using  bedrails?: A Little Help needed moving from lying on your back to sitting on the side of a flat bed without using bedrails?: A Lot Help needed moving to and from a bed to a chair (including a wheelchair)?: A Little Help needed standing up from a chair using your arms (e.g., wheelchair or bedside chair)?: A Lot Help needed to walk in hospital room?: A Little Help needed climbing 3-5 steps with a railing? : A Lot 6 Click Score: 15    End of Session Equipment Utilized During Treatment: Gait belt Activity Tolerance: Patient limited by pain;Patient limited by fatigue Patient left: in chair;with call bell/phone within reach Nurse Communication: Mobility status PT Visit Diagnosis: Muscle weakness (generalized) (M62.81);Difficulty in walking, not elsewhere classified (R26.2);Pain Pain -  Right/Left: Left Pain - part of body: Knee     Time: 1147-1202 PT Time Calculation (min) (ACUTE ONLY): 15 min  Charges:  $Gait Training: 8-22 mins                     Jacob Berger PT 05/29/2019  Acute Rehabilitation Services Pager [phone] Office [phone]

## 2019-05-29 NOTE — Progress Notes (Signed)
Physical Therapy Treatment Patient Details Name: Jacob Berger MRN: [id number] DOB: [birth year] Today's Date: 05/29/2019    History of Present Illness L TKA    PT Comments    Pt ambulated 15' from bathroom to bed, he declined further ambulation 2* L knee pain. Performed TKA HEP with min assist. AAROM L knee ~5-70*.   Follow Up Recommendations  Follow surgeon's recommendation for DC plan and follow-up therapies     Equipment Recommendations  Rolling walker with 5" wheels;3in1 (PT)    Recommendations for Other Services       Precautions / Restrictions Precautions Precautions: Knee Precaution Booklet Issued: Yes (comment) Precaution Comments: reviewed positioning of LE following knee surgery with pt Restrictions Weight Bearing Restrictions: No LLE Weight Bearing: Weight bearing as tolerated    Mobility  Bed Mobility Overal bed mobility: Needs Assistance Bed Mobility: Sit to Supine     Supine to sit: Mod assist;HOB elevated Sit to supine: Mod assist   General bed mobility comments: assist for LEs into bed  Transfers Overall transfer level: Needs assistance Equipment used: Rolling walker (2 wheeled) Transfers: Sit to/from Stand Sit to Stand: Min assist;From elevated surface         General transfer comment: up in bathroom  Ambulation/Gait Ambulation/Gait assistance: Min guard Gait Distance (Feet): 15 Feet Assistive device: Rolling walker (2 wheeled) Gait Pattern/deviations: Step-to pattern;Decreased stride length;Decreased weight shift to left Gait velocity: decr   General Gait Details: heavy reliance upon RW for UE support 2* L knee pain with WB, distance limited by pain/fatigue   Stairs             Wheelchair Mobility    Modified Rankin (Stroke Patients Only)       Balance Overall balance assessment: Modified Independent                                          Cognition Arousal/Alertness: Awake/alert Behavior  During Therapy: WFL for tasks assessed/performed;Flat affect Overall Cognitive Status: Within Functional Limits for tasks assessed                                        Exercises Total Joint Exercises Ankle Circles/Pumps: AROM;Both;Supine;15 reps Quad Sets: AROM;Strengthening;Left;10 reps;Supine Gluteal Sets: AROM;Strengthening;Both;10 reps;Supine Short Arc Quad: AAROM;Left;10 reps;Supine Heel Slides: AAROM;Left;10 reps;Supine Hip ABduction/ADduction: AAROM;Left;10 reps;Supine Straight Leg Raises: AAROM;Left;10 reps;Supine Long Arc Quad: AAROM;Left;10 reps;Seated Knee Flexion: Left;10 reps;Seated Goniometric ROM: 5-70* AAROM L knee General Exercises - Lower Extremity Long Arc Quad: AAROM;Left;10 reps;Seated    General Comments        Pertinent Vitals/Pain Pain Assessment: 0-10 Pain Score: 5  Pain Location: L knee Pain Descriptors / Indicators: Sore Pain Intervention(s): Limited activity within patient's tolerance;Monitored during session;Patient requesting pain meds-RN notified(pt declined ice)    Home Living                      Prior Function            PT Goals (current goals can now be found in the care plan section) Acute Rehab PT Goals Patient Stated Goal: play golf PT Goal Formulation: With patient Time For Goal Achievement: 06/03/19 Potential to Achieve Goals: Good Progress towards PT goals: Progressing toward goals    Frequency    7X/week  PT Plan Current plan remains appropriate    Co-evaluation              AM-PAC PT "6 Clicks" Mobility   Outcome Measure  Help needed turning from your back to your side while in a flat bed without using bedrails?: A Little Help needed moving from lying on your back to sitting on the side of a flat bed without using bedrails?: A Lot Help needed moving to and from a bed to a chair (including a wheelchair)?: A Little Help needed standing up from a chair using your arms (e.g.,  wheelchair or bedside chair)?: A Lot Help needed to walk in hospital room?: A Little Help needed climbing 3-5 steps with a railing? : A Lot 6 Click Score: 15    End of Session Equipment Utilized During Treatment: Gait belt Activity Tolerance: Patient limited by pain;Patient limited by fatigue Patient left: with call bell/phone within reach;in bed;with nursing/sitter in room Nurse Communication: Mobility status PT Visit Diagnosis: Muscle weakness (generalized) (M62.81);Difficulty in walking, not elsewhere classified (R26.2);Pain Pain - Right/Left: Left Pain - part of body: Knee     Time: 3220-2542 PT Time Calculation (min) (ACUTE ONLY): 17 min  Charges:  $Therapeutic Exercise: 8-22 mins                    Blondell Reveal Kistler PT 05/29/2019  Acute Rehabilitation Services Pager [phone] Office [phone]

## 2019-05-29 NOTE — Progress Notes (Signed)
Physical Therapy Progress Note  Clinical Impression: Pt seen for LE strengthening therex. Pt with increasing pain with exercises, requiring assistance and cueing throughout. PT also provided pt education re: ice, generalized walking program, progression of HEP and car transfers with demonstration. Pt would continue to benefit from skilled physical therapy services at this time while admitted and after d/c to address the below listed limitations in order to improve overall safety and independence with functional mobility.  Sherie Don, [place], DPT  Acute Rehabilitation Services Pager [phone] Office [phone]    05/29/19 1200  PT Visit Information  Last PT Received On 05/29/19  Assistance Needed +1  History of Present Illness Pt is a 73 y/o male s/p L TKA on 05/27/19. PMH of ACDF C4-5 and C5-6 11/09/17. Pt reports his L eye droop is due recent cataract surgery  Precautions  Precautions Knee  Precaution Booklet Issued Yes (comment)  Precaution Comments reviewed positioning of LE following knee surgery with pt  Restrictions  Weight Bearing Restrictions Yes  LLE Weight Bearing WBAT  Pain Assessment  Pain Assessment 0-10  Pain Score 4  Pain Location L knee  Pain Descriptors / Indicators Sore  Pain Intervention(s) Monitored during session;Repositioned;Premedicated before session  Cognition  Arousal/Alertness Awake/alert  Behavior During Therapy WFL for tasks assessed/performed  Overall Cognitive Status Within Functional Limits for tasks assessed  Exercises  Exercises Total Joint  Total Joint Exercises  Ankle Circles/Pumps AROM;Both;20 reps;Supine  Heel Slides AAROM;Left;10 reps;Supine  Quad Sets AROM;Strengthening;Left;10 reps;Supine  Gluteal Sets AROM;Strengthening;Both;10 reps;Supine  Short Arc Quad AAROM;Left;10 reps;Supine  Hip ABduction/ADduction AAROM;Left;10 reps;Supine  Straight Leg Raises AAROM;Left;10 reps;Supine  PT - End of Session  Activity Tolerance Patient  tolerated treatment well  Patient left in bed;with call bell/phone within reach;with bed alarm set  Nurse Communication Mobility status   PT - Assessment/Plan  PT Plan Current plan remains appropriate  PT Visit Diagnosis Difficulty in walking, not elsewhere classified (R26.2);Pain;Muscle weakness (generalized) (M62.81)  Pain - Right/Left Left  Pain - part of body Knee  PT Frequency (ACUTE ONLY) 7X/week  Follow Up Recommendations Supervision/Assistance - 24 hour;Home health PT  PT equipment None recommended by PT  AM-PAC PT "6 Clicks" Mobility Outcome Measure (Version 2)  Help needed turning from your back to your side while in a flat bed without using bedrails? 3  Help needed moving from lying on your back to sitting on the side of a flat bed without using bedrails? 3  Help needed moving to and from a bed to a chair (including a wheelchair)? 3  Help needed standing up from a chair using your arms (e.g., wheelchair or bedside chair)? 3  Help needed to walk in hospital room? 3  Help needed climbing 3-5 steps with a railing?  2  6 Click Score 17  Consider Recommendation of Discharge To: Home with [facility]  PT Goal Progression  Progress towards PT goals Progressing toward goals  Acute Rehab PT Goals  PT Goal Formulation With patient  Time For Goal Achievement 06/03/19  Potential to Achieve Goals Good  PT Time Calculation  PT Start Time (ACUTE ONLY) 1000  PT Stop Time (ACUTE ONLY) 1015  PT Time Calculation (min) (ACUTE ONLY) 15 min  PT General Charges  $$ ACUTE PT VISIT 1 Visit  PT Treatments  $Therapeutic Exercise 8-22 mins

## 2019-05-30 LAB — GLUCOSE, CAPILLARY
Glucose-Capillary: 181 mg/dL — ABNORMAL HIGH (ref 70–99)
Glucose-Capillary: 182 mg/dL — ABNORMAL HIGH (ref 70–99)

## 2019-05-30 MED ORDER — ASPIRIN EC 81 MG PO TBEC: 81.0000 mg | DELAYED_RELEASE_TABLET | Freq: Two times a day (BID) | ORAL | 0 refills | Status: DC

## 2019-05-30 MED ORDER — HYDROCODONE-ACETAMINOPHEN 5-325 MG PO TABS: 1.0000 | ORAL_TABLET | Freq: Four times a day (QID) | ORAL | 0 refills | Status: DC | PRN

## 2019-05-30 MED ORDER — TIZANIDINE HCL 4 MG PO TABS: 4.0000 mg | ORAL_TABLET | Freq: Four times a day (QID) | ORAL | 1 refills | Status: AC | PRN

## 2019-05-30 NOTE — Care Management Important Message (Signed)
Important Message  Patient Details IM Letter given to Kathrin Greathouse RN to present to the Patient Name: Jacob Berger MRN: [id number] Date of Birth: 1945-11-14   Medicare Important Message Given:  Yes     Kerin Salen 05/30/2019, 10:26 AM

## 2019-05-30 NOTE — Progress Notes (Signed)
Subjective: 3 Days Post-Op Procedure(s) (LRB): Left Knee Arthroplasty (Left)   Patient feels better this morning and says his son will come pick him up between 4 and 5 today.  Activity level:  wbat Diet tolerance:  ok Voiding:  ok Patient reports pain as mild.    Objective: Vital signs in last 24 hours: Temp:  [98.8 F (37.1 C)-99.6 F (37.6 C)] 98.8 F (37.1 C) (07/24 0547) Pulse Rate:  [88-111] 89 (07/24 0547) Resp:  [16-19] 19 (07/24 0547) BP: (136-164)/(65-81) 136/72 (07/24 0547) SpO2:  [96 %-100 %] 96 % (07/24 0547)  Labs: No results for input(s): HGB in the last 72 hours. No results for input(s): WBC, RBC, HCT, PLT in the last 72 hours. No results for input(s): NA, K, CL, CO2, BUN, CREATININE, GLUCOSE, CALCIUM in the last 72 hours. No results for input(s): LABPT, INR in the last 72 hours.  Physical Exam:  Neurologically intact ABD soft Neurovascular intact Sensation intact distally Intact pulses distally Dorsiflexion/Plantar flexion intact Incision: dressing C/D/I and no drainage No cellulitis present Compartment soft  Assessment/Plan:  3 Days Post-Op Procedure(s) (LRB): Left Knee Arthroplasty (Left) Advance diet Up with therapy Discharge home with home health today. Follow up in the office as scheduled 2 weeks post op. Continue on ASA 81mg  BID x 2 weeks post op   Larwance Sachs Cressie Betzler 05/30/2019, 8:11 AM

## 2019-05-30 NOTE — Progress Notes (Signed)
Physical Therapy Treatment Patient Details Name: Jacob Berger MRN: [id number] DOB: 08-17-1946 Today's Date: 05/30/2019    History of Present Illness L TKA    PT Comments    Pt assisted with ambulating in hallway and practiced safe stair technique.  Pt provided with stair and HEP handouts.  Pt overall currently min assist for bed mobility and sit to stand, and pt reports son can assist him at home.  Pt anticipates d/c home today and plans to perform exercises once home.   Follow Up Recommendations  Follow surgeon's recommendation for DC plan and follow-up therapies     Equipment Recommendations  Rolling walker with 5" wheels;3in1 (PT)    Recommendations for Other Services       Precautions / Restrictions Precautions Precautions: Knee;Fall Restrictions Other Position/Activity Restrictions: WBAT    Mobility  Bed Mobility Overal bed mobility: Needs Assistance Bed Mobility: Supine to Sit     Supine to sit: Min assist     General bed mobility comments: assist for L LE  Transfers Overall transfer level: Needs assistance Equipment used: Rolling walker (2 wheeled) Transfers: Sit to/from Stand Sit to Stand: Min assist;From elevated surface         General transfer comment: verbal cues for UE and LE positioning, assist to rise and steady  Ambulation/Gait Ambulation/Gait assistance: Min guard Gait Distance (Feet): 100 Feet Assistive device: Rolling walker (2 wheeled) Gait Pattern/deviations: Step-to pattern;Decreased stance time - left;Antalgic Gait velocity: decr   General Gait Details: verbal cues for sequence, RW positioning, step length, posture   Stairs Stairs: Yes Stairs assistance: Min guard Stair Management: Step to pattern;Backwards;With walker Number of Stairs: 2 General stair comments: verbal cues for sequence, safety, RW positioning; pt reports understanding and did not feel he needed to practice again, states his son can assist him home (pt  aware another person needs to hold RW for safety) so provided stair handout   Wheelchair Mobility    Modified Rankin (Stroke Patients Only)       Balance                                            Cognition Arousal/Alertness: Awake/alert Behavior During Therapy: WFL for tasks assessed/performed;Flat affect Overall Cognitive Status: Within Functional Limits for tasks assessed                                        Exercises      General Comments        Pertinent Vitals/Pain Pain Assessment: 0-10 Pain Score: 4  Pain Location: L knee Pain Descriptors / Indicators: Aching;Sore Pain Intervention(s): Monitored during session;Premedicated before session;Repositioned;Ice applied    Home Living                      Prior Function            PT Goals (current goals can now be found in the care plan section) Progress towards PT goals: Progressing toward goals    Frequency    7X/week      PT Plan Current plan remains appropriate    Co-evaluation              AM-PAC PT "6 Clicks" Mobility   Outcome Measure  Help needed turning from  your back to your side while in a flat bed without using bedrails?: A Little Help needed moving from lying on your back to sitting on the side of a flat bed without using bedrails?: A Little Help needed moving to and from a bed to a chair (including a wheelchair)?: A Little Help needed standing up from a chair using your arms (e.g., wheelchair or bedside chair)?: A Little Help needed to walk in hospital room?: A Little Help needed climbing 3-5 steps with a railing? : A Little 6 Click Score: 18    End of Session Equipment Utilized During Treatment: Gait belt Activity Tolerance: Patient limited by fatigue Patient left: with call bell/phone within reach;in chair Nurse Communication: Mobility status PT Visit Diagnosis: Muscle weakness (generalized) (M62.81);Difficulty in walking, not  elsewhere classified (R26.2)     Time: 2229-7989 PT Time Calculation (min) (ACUTE ONLY): 28 min  Charges:  $Gait Training: 23-37 mins                    Jacob Berger, PT, DPT Acute Rehabilitation Services Office: [phone] Pager: [phone]  Jacob Berger 05/30/2019, 3:39 PM

## 2019-05-30 NOTE — Discharge Summary (Signed)
Patient ID: Jacob Berger MRN: [id number] DOB/AGE: 03/19/1946 73 y.o.  Admit date: 05/27/2019 Discharge date: 05/30/2019  Admission Diagnoses:  Principal Problem:   Primary osteoarthritis of left knee   Discharge Diagnoses:  Same  Past Medical History:  Diagnosis Date  . Arthritis    "knees" (05/10/2016)  . CAD S/P percutaneous coronary angioplasty 2011   a. 2011: s/p PCI to PDA with Endeavor 2.5 mm x 12 mm DES - [facility] b. low-risk NST in 07/2015  . Cancer of kidney ([place])   . Chronic kidney disease   . Daily headache    "recently" (05/10/2016)  . Dyslipidemia, goal LDL below 70   . Glaucoma   . Gout   . History of kidney stones   . Hypertension, essential   . Obesity, Class II, BMI 35-39.9, with comorbidity    BMI 37  . OSA on CPAP   . PONV (postoperative nausea and vomiting)    after colonoscopy  . Prostate cancer ([place]) 2007  . Rash since 04-08-16   on neck healing, right arm improving  . Thoracic aortic aneurysm without rupture ([place]) - Noted on chest CT. 4.5 cm 02/15/2016   4.5 cm per dr Cyndia Bent note and [facility] ct 01-18-16  . Type II diabetes mellitus ([place])     Surgeries: Procedure(s): Left Knee Arthroplasty on 05/27/2019   Consultants:   Discharged Condition: Improved  Hospital Course: Jacob Berger is an 73 y.o. male who was admitted 05/27/2019 for operative treatment ofPrimary osteoarthritis of left knee. Patient has severe unremitting pain that affects sleep, daily activities, and work/hobbies. After pre-op clearance the patient was taken to the operating room on 05/27/2019 and underwent  Procedure(s): Left Knee Arthroplasty.    Patient was given perioperative antibiotics:  Anti-infectives (From admission, onward)   Start     Dose/Rate Route Frequency Ordered Stop   05/27/19 1600  ceFAZolin (ANCEF) IVPB 2g/100 mL premix     2 g 200 mL/hr over 30 Minutes Intravenous Every 6 hours 05/27/19 1510 05/27/19 2300   05/27/19 0600   ceFAZolin (ANCEF) 3 g in dextrose 5 % 50 mL IVPB     3 g 100 mL/hr over 30 Minutes Intravenous On call to O.R. 05/26/19 0800 05/27/19 1028       Patient was given sequential compression devices, early ambulation, and chemoprophylaxis to prevent DVT.  Patient benefited maximally from hospital stay and there were no complications.    Recent vital signs:  Patient Vitals for the past 24 hrs:  BP Temp Temp src Pulse Resp SpO2  05/30/19 0547 136/72 98.8 F (37.1 C) Oral 89 19 96 %  05/29/19 2243 (!) 146/65 99.6 F (37.6 C) Oral 88 19 96 %  05/29/19 2202 - - - - 18 -  05/29/19 1347 (!) 164/81 99 F (37.2 C) Oral (!) 111 16 100 %     Recent laboratory studies: No results for input(s): WBC, HGB, HCT, PLT, NA, K, CL, CO2, BUN, CREATININE, GLUCOSE, INR, CALCIUM in the last 72 hours.  Invalid input(s): PT, 2   Discharge Medications:   Allergies as of 05/30/2019      Reactions   Accupril [quinapril Hcl] Swelling   MOUTH SWELLING      Medication List    TAKE these medications   allopurinol 300 MG tablet Commonly known as: ZYLOPRIM Take 300 mg by mouth daily.   aspirin EC 81 MG tablet Take 1 tablet (81 mg total) by mouth 2 (two) times  daily after a meal. What changed: when to take this   Azor 10-40 MG tablet Generic drug: amLODipine-olmesartan TAKE 1 TABLET BY MOUTH DAILY   Citrucel 500 MG Tabs Generic drug: Methylcellulose (Laxative) Take 500 mg by mouth daily.   diphenhydrAMINE 25 MG tablet Commonly known as: BENADRYL Take 1 tablet (25 mg total) by mouth every 6 (six) hours as needed for itching.   diphenhydramine-acetaminophen 25-500 MG Tabs tablet Commonly known as: TYLENOL PM Take 1 tablet by mouth at bedtime as needed (sleep).   EMERGEN-C IMMUNE PO Take 1 tablet by mouth daily.   fenofibrate 145 MG tablet Commonly known as: TRICOR Take 145 mg by mouth daily at 2 PM.   furosemide 80 MG tablet Commonly known as: LASIX Take 20-40 mg by mouth daily as needed  (for fluid).   HYDROcodone-acetaminophen 5-325 MG tablet Commonly known as: NORCO/VICODIN Take 1-2 tablets by mouth every 6 (six) hours as needed for moderate pain (pain score 4-6).   Levemir FlexTouch 100 UNIT/ML Pen Generic drug: Insulin Detemir Inject 43 Units into the skin at bedtime.   Lumigan 0.01 % Soln Generic drug: bimatoprost Place 1 drop into both eyes at bedtime.   nitroGLYCERIN 0.4 MG SL tablet Commonly known as: NITROSTAT Place 1 tablet (0.4 mg total) under the tongue every 5 (five) minutes as needed for chest pain.   NovoLOG FlexPen 100 UNIT/ML FlexPen Generic drug: insulin aspart Inject 15 Units into the skin 3 (three) times daily before meals.   prednisoLONE acetate 1 % ophthalmic suspension Commonly known as: PRED FORTE Place 1 drop into the left eye 6 (six) times daily.   QUNOL COQ10/UBIQUINOL/MEGA PO Take 20 mLs by mouth 2 (two) times a week.   Refresh Liquigel 1 % Gel Generic drug: Carboxymethylcellulose Sodium Place 1 drop into the left eye as needed (eye irritation).   sildenafil 25 MG tablet Commonly known as: VIAGRA TAKE 1  TO 2 TABLET  AS NEEDED ,FOR ERECTILE DYSFUNCTION   timolol 0.5 % ophthalmic solution Commonly known as: TIMOPTIC Place 1 drop into the right eye 2 (two) times daily. Morning and afternoon   tiZANidine 4 MG tablet Commonly known as: Zanaflex Take 1 tablet (4 mg total) by mouth every 6 (six) hours as needed.   Trulicity 1.5 DD/2.2GU Sopn Generic drug: Dulaglutide Inject 1.5 mg into the skin every Thursday. In the afternoon 2pm   Vytorin 10-40 MG tablet Generic drug: ezetimibe-simvastatin Take 1 tablet by mouth at bedtime.            Durable Medical Equipment  (From admission, onward)         Start     Ordered   05/27/19 1511  DME Walker rolling  Once    Question:  Patient needs a walker to treat with the following condition  Answer:  Primary osteoarthritis of left knee   05/27/19 1510   05/27/19 1511  DME 3  n 1  Once     05/27/19 1510   05/27/19 1511  DME Bedside commode  Once    Question:  Patient needs a bedside commode to treat with the following condition  Answer:  Primary osteoarthritis of left knee   05/27/19 1510          Diagnostic Studies: Dg Chest 2 View  Result Date: 05/23/2019 CLINICAL DATA:  Preop exam. EXAM: CHEST - 2 VIEW COMPARISON:  11/28/2017 FINDINGS: Lungs are clear. Cardiomediastinal silhouette is within normal. There are degenerative changes of the spine. IMPRESSION: No active  cardiopulmonary disease. Electronically Signed   By: Marin Olp M.D.   On: 05/23/2019 21:00   Mr Neck Wo/w Cm  Result Date: 05/09/2019 CLINICAL DATA:  73 year old male with prior cervical fusion. New right side neck pain with a palpable area associated near the base of the skull. Creatinine was obtained on site at [place] at [address] Results: Creatinine 1.7 mg/dL. EXAM: MRI OF THE NECK WITH CONTRAST TECHNIQUE: Multiplanar, multisequence MR imaging was performed following the administration of intravenous contrast. CONTRAST:  11mL MULTIHANCE GADOBENATE DIMEGLUMINE 529 MG/ML IV SOLN COMPARISON:  Cervical spine MRI 10/25/2018. FINDINGS: Pharynx and larynx: Negative larynx. Negative pharynx. Parapharyngeal and retropharyngeal spaces are normal. Salivary glands: Negative sublingual space. Submandibular glands appear symmetric and normal. Parotid glands appear symmetric and normal. Thyroid: Negative. Lymph nodes: A palpable area of concern is marked above and below along the posterior right upper neck as seen on series 4, image 7. Situated between the skin markers there is an asymmetric but poorly in capsulated area of increased subcutaneous fat encompassing about 2.5 centimeters diameter (series 4, image 7 and series 5, image 13). No associated enhancement, inflammation or soft tissue complexity. No regional lymphadenopathy. The underlying muscle layers appear symmetric and normal. --no  cervical lymphadenopathy. Vascular: Major vascular flow voids in the neck are preserved. Limited intracranial: Negative. Visualized orbits: Minimally included, negative. Mastoids and visualized paranasal sinuses: Clear. Skeleton: Prior cervical ACDF, stable cervical spine since 2019. No marrow edema or evidence of acute osseous abnormality. Upper chest: Negative. IMPRESSION: 1. The palpable area of concern corresponds to asymmetric subcutaneous fat which although poorly encapsulated may reflect a small benign lipoma (about 2.5 cm diameter). No associated inflammation or complicating features. 2. Otherwise negative MRI appearance of the neck. 3. Cervical spine with prior ACDF appears stable since 2019. Electronically Signed   By: Genevie Ann M.D.   On: 05/09/2019 21:46    Disposition: Discharge disposition: 01-Home or Self Care       Discharge Instructions    Call MD / Call 911   Complete by: As directed    If you experience chest pain or shortness of breath, CALL 911 and be transported to the hospital emergency room.  If you develope a fever above 101 F, pus (white drainage) or increased drainage or redness at the wound, or calf pain, call your surgeon's office.   Constipation Prevention   Complete by: As directed    Drink plenty of fluids.  Prune juice may be helpful.  You may use a stool softener, such as Colace (over the counter) 100 mg twice a day.  Use MiraLax (over the counter) for constipation as needed.   Diet - low sodium heart healthy   Complete by: As directed    Discharge instructions   Complete by: As directed    INSTRUCTIONS AFTER JOINT REPLACEMENT   Remove items at home which could result in a fall. This includes throw rugs or furniture in walking pathways ICE to the affected joint every three hours while awake for 30 minutes at a time, for at least the first 3-5 days, and then as needed for pain and swelling.  Continue to use ice for pain and swelling. You may notice swelling that  will progress down to the foot and ankle.  This is normal after surgery.  Elevate your leg when you are not up walking on it.   Continue to use the breathing machine you got in the hospital (incentive spirometer) which will  help keep your temperature down.  It is common for your temperature to cycle up and down following surgery, especially at night when you are not up moving around and exerting yourself.  The breathing machine keeps your lungs expanded and your temperature down.   DIET:  As you were doing prior to hospitalization, we recommend a well-balanced diet.  DRESSING / WOUND CARE / SHOWERING  You may shower 3 days after surgery, but keep the wounds dry during showering.  You may use an occlusive plastic wrap (Press'n Seal for example), NO SOAKING/SUBMERGING IN THE BATHTUB.  If the bandage gets wet, change with a clean dry gauze.  If the incision gets wet, pat the wound dry with a clean towel.  ACTIVITY  Increase activity slowly as tolerated, but follow the weight bearing instructions below.   No driving for 6 weeks or until further direction given by your physician.  You cannot drive while taking narcotics.  No lifting or carrying greater than 10 lbs. until further directed by your surgeon. Avoid periods of inactivity such as sitting longer than an hour when not asleep. This helps prevent blood clots.  You may return to work once you are authorized by your doctor.     WEIGHT BEARING   Weight bearing as tolerated with assist device (walker, cane, etc) as directed, use it as long as suggested by your surgeon or therapist, typically at least 4-6 weeks.   EXERCISES  Results after joint replacement surgery are often greatly improved when you follow the exercise, range of motion and muscle strengthening exercises prescribed by your doctor. Safety measures are also important to protect the joint from further injury. Any time any of these exercises cause you to have increased pain or  swelling, decrease what you are doing until you are comfortable again and then slowly increase them. If you have problems or questions, call your caregiver or physical therapist for advice.   Rehabilitation is important following a joint replacement. After just a few days of immobilization, the muscles of the leg can become weakened and shrink (atrophy).  These exercises are designed to build up the tone and strength of the thigh and leg muscles and to improve motion. Often times heat used for twenty to thirty minutes before working out will loosen up your tissues and help with improving the range of motion but do not use heat for the first two weeks following surgery (sometimes heat can increase post-operative swelling).   These exercises can be done on a training (exercise) mat, on the floor, on a table or on a bed. Use whatever works the best and is most comfortable for you.    Use music or television while you are exercising so that the exercises are a pleasant break in your day. This will make your life better with the exercises acting as a break in your routine that you can look forward to.   Perform all exercises about fifteen times, three times per day or as directed.  You should exercise both the operative leg and the other leg as well.   Exercises include:   Quad Sets - Tighten up the muscle on the front of the thigh (Quad) and hold for 5-10 seconds.   Straight Leg Raises - With your knee straight (if you were given a brace, keep it on), lift the leg to 60 degrees, hold for 3 seconds, and slowly lower the leg.  Perform this exercise against resistance later as your leg gets stronger.  Leg Slides: Lying on your back, slowly slide your foot toward your buttocks, bending your knee up off the floor (only go as far as is comfortable). Then slowly slide your foot back down until your leg is flat on the floor again.  Angel Wings: Lying on your back spread your legs to the side as far apart as you can  without causing discomfort.  Hamstring Strength:  Lying on your back, push your heel against the floor with your leg straight by tightening up the muscles of your buttocks.  Repeat, but this time bend your knee to a comfortable angle, and push your heel against the floor.  You may put a pillow under the heel to make it more comfortable if necessary.   A rehabilitation program following joint replacement surgery can speed recovery and prevent re-injury in the future due to weakened muscles. Contact your doctor or a physical therapist for more information on knee rehabilitation.    CONSTIPATION  Constipation is defined medically as fewer than three stools per week and severe constipation as less than one stool per week.  Even if you have a regular bowel pattern at home, your normal regimen is likely to be disrupted due to multiple reasons following surgery.  Combination of anesthesia, postoperative narcotics, change in appetite and fluid intake all can affect your bowels.   YOU MUST use at least one of the following options; they are listed in order of increasing strength to get the job done.  They are all available over the counter, and you may need to use some, POSSIBLY even all of these options:    Drink plenty of fluids (prune juice may be helpful) and high fiber foods Colace 100 mg by mouth twice a day  Senokot for constipation as directed and as needed Dulcolax (bisacodyl), take with full glass of water  Miralax (polyethylene glycol) once or twice a day as needed.  If you have tried all these things and are unable to have a bowel movement in the first 3-4 days after surgery call either your surgeon or your primary doctor.    If you experience loose stools or diarrhea, hold the medications until you stool forms back up.  If your symptoms do not get better within 1 week or if they get worse, check with your doctor.  If you experience "the worst abdominal pain ever" or develop nausea or vomiting,  please contact the office immediately for further recommendations for treatment.   ITCHING:  If you experience itching with your medications, try taking only a single pain pill, or even half a pain pill at a time.  You can also use Benadryl over the counter for itching or also to help with sleep.   TED HOSE STOCKINGS:  Use stockings on both legs until for at least 2 weeks or as directed by physician office. They may be removed at night for sleeping.  MEDICATIONS:  See your medication summary on the "After Visit Summary" that nursing will review with you.  You may have some home medications which will be placed on hold until you complete the course of blood thinner medication.  It is important for you to complete the blood thinner medication as prescribed.  PRECAUTIONS:  If you experience chest pain or shortness of breath - call 911 immediately for transfer to the hospital emergency department.   If you develop a fever greater that 101 F, purulent drainage from wound, increased redness or drainage from wound, foul odor from the  wound/dressing, or calf pain - CONTACT YOUR SURGEON.                                                   FOLLOW-UP APPOINTMENTS:  If you do not already have a post-op appointment, please call the office for an appointment to be seen by your surgeon.  Guidelines for how soon to be seen are listed in your "After Visit Summary", but are typically between 1-4 weeks after surgery.  OTHER INSTRUCTIONS:   Knee Replacement:  Do not place pillow under knee, focus on keeping the knee straight while resting. CPM instructions: 0-90 degrees, 2 hours in the morning, 2 hours in the afternoon, and 2 hours in the evening. Place foam block, curve side up under heel at all times except when in CPM or when walking.  DO NOT modify, tear, cut, or change the foam block in any way.  MAKE SURE YOU:  Understand these instructions.  Get help right away if you are not doing well or get worse.     Thank you for letting us be a part of your medical care team.  It is a privilege we respect greatly.  We hope these instructions will help you stay on track for a fast and full recovery!   Increase activity slowly as tolerated   Complete by: As directed       Follow-up Information    Melrose Nakayama, MD. Go on 06/06/2019.   Specialty: Orthopedic Surgery Why: Your appointment has been scheduled for 9:15 Contact information: [place] [id number] [phone]        [facility] Follow up.   Specialty: [place] Why: You will have 5 HHPT visits prior to starting Outpatient physical therapy  Contact information: [address] [place] [address] [phone]        [facility], [place]. Go on 06/06/2019.   Why: You are scheduled to start outpatient physical therapy at 11:20. Please go directly over to the therapy desk after your MD appointment to complete your paperwork.  Contact information: Physical Therapy [address] [place]  [address] [phone]            Signed: Larwance Sachs Mckaylah Bettendorf 05/30/2019, 8:18 AM

## 2019-05-31 DIAGNOSIS — Z8546 Personal history of malignant neoplasm of prostate: Secondary | ICD-10-CM | POA: Diagnosis not present

## 2019-05-31 DIAGNOSIS — E785 Hyperlipidemia, unspecified: Secondary | ICD-10-CM | POA: Diagnosis not present

## 2019-05-31 DIAGNOSIS — Z85528 Personal history of other malignant neoplasm of kidney: Secondary | ICD-10-CM | POA: Diagnosis not present

## 2019-05-31 DIAGNOSIS — Z6836 Body mass index (BMI) 36.0-36.9, adult: Secondary | ICD-10-CM | POA: Diagnosis not present

## 2019-05-31 DIAGNOSIS — E1122 Type 2 diabetes mellitus with diabetic chronic kidney disease: Secondary | ICD-10-CM | POA: Diagnosis not present

## 2019-05-31 DIAGNOSIS — M4722 Other spondylosis with radiculopathy, cervical region: Secondary | ICD-10-CM | POA: Diagnosis not present

## 2019-05-31 DIAGNOSIS — H409 Unspecified glaucoma: Secondary | ICD-10-CM | POA: Diagnosis not present

## 2019-05-31 DIAGNOSIS — G5601 Carpal tunnel syndrome, right upper limb: Secondary | ICD-10-CM | POA: Diagnosis not present

## 2019-05-31 DIAGNOSIS — I2510 Atherosclerotic heart disease of native coronary artery without angina pectoris: Secondary | ICD-10-CM | POA: Diagnosis not present

## 2019-05-31 DIAGNOSIS — I129 Hypertensive chronic kidney disease with stage 1 through stage 4 chronic kidney disease, or unspecified chronic kidney disease: Secondary | ICD-10-CM | POA: Diagnosis not present

## 2019-05-31 DIAGNOSIS — M109 Gout, unspecified: Secondary | ICD-10-CM | POA: Diagnosis not present

## 2019-05-31 DIAGNOSIS — Z96652 Presence of left artificial knee joint: Secondary | ICD-10-CM | POA: Diagnosis not present

## 2019-05-31 DIAGNOSIS — Z794 Long term (current) use of insulin: Secondary | ICD-10-CM | POA: Diagnosis not present

## 2019-05-31 DIAGNOSIS — Z471 Aftercare following joint replacement surgery: Secondary | ICD-10-CM | POA: Diagnosis not present

## 2019-05-31 DIAGNOSIS — E669 Obesity, unspecified: Secondary | ICD-10-CM | POA: Diagnosis not present

## 2019-05-31 DIAGNOSIS — G4733 Obstructive sleep apnea (adult) (pediatric): Secondary | ICD-10-CM | POA: Diagnosis not present

## 2019-05-31 DIAGNOSIS — N183 Chronic kidney disease, stage 3 (moderate): Secondary | ICD-10-CM | POA: Diagnosis not present

## 2019-05-31 DIAGNOSIS — M4712 Other spondylosis with myelopathy, cervical region: Secondary | ICD-10-CM | POA: Diagnosis not present

## 2019-05-31 DIAGNOSIS — I712 Thoracic aortic aneurysm, without rupture: Secondary | ICD-10-CM | POA: Diagnosis not present

## 2019-06-02 DIAGNOSIS — M4722 Other spondylosis with radiculopathy, cervical region: Secondary | ICD-10-CM | POA: Diagnosis not present

## 2019-06-02 DIAGNOSIS — I129 Hypertensive chronic kidney disease with stage 1 through stage 4 chronic kidney disease, or unspecified chronic kidney disease: Secondary | ICD-10-CM | POA: Diagnosis not present

## 2019-06-02 DIAGNOSIS — I2510 Atherosclerotic heart disease of native coronary artery without angina pectoris: Secondary | ICD-10-CM | POA: Diagnosis not present

## 2019-06-02 DIAGNOSIS — E1122 Type 2 diabetes mellitus with diabetic chronic kidney disease: Secondary | ICD-10-CM | POA: Diagnosis not present

## 2019-06-02 DIAGNOSIS — Z471 Aftercare following joint replacement surgery: Secondary | ICD-10-CM | POA: Diagnosis not present

## 2019-06-02 DIAGNOSIS — N183 Chronic kidney disease, stage 3 (moderate): Secondary | ICD-10-CM | POA: Diagnosis not present

## 2019-06-03 DIAGNOSIS — M4722 Other spondylosis with radiculopathy, cervical region: Secondary | ICD-10-CM | POA: Diagnosis not present

## 2019-06-03 DIAGNOSIS — Z471 Aftercare following joint replacement surgery: Secondary | ICD-10-CM | POA: Diagnosis not present

## 2019-06-03 DIAGNOSIS — I129 Hypertensive chronic kidney disease with stage 1 through stage 4 chronic kidney disease, or unspecified chronic kidney disease: Secondary | ICD-10-CM | POA: Diagnosis not present

## 2019-06-03 DIAGNOSIS — N183 Chronic kidney disease, stage 3 (moderate): Secondary | ICD-10-CM | POA: Diagnosis not present

## 2019-06-03 DIAGNOSIS — I2510 Atherosclerotic heart disease of native coronary artery without angina pectoris: Secondary | ICD-10-CM | POA: Diagnosis not present

## 2019-06-03 DIAGNOSIS — E1122 Type 2 diabetes mellitus with diabetic chronic kidney disease: Secondary | ICD-10-CM | POA: Diagnosis not present

## 2019-06-04 DIAGNOSIS — Z471 Aftercare following joint replacement surgery: Secondary | ICD-10-CM | POA: Diagnosis not present

## 2019-06-04 DIAGNOSIS — E1122 Type 2 diabetes mellitus with diabetic chronic kidney disease: Secondary | ICD-10-CM | POA: Diagnosis not present

## 2019-06-04 DIAGNOSIS — M4722 Other spondylosis with radiculopathy, cervical region: Secondary | ICD-10-CM | POA: Diagnosis not present

## 2019-06-04 DIAGNOSIS — N183 Chronic kidney disease, stage 3 (moderate): Secondary | ICD-10-CM | POA: Diagnosis not present

## 2019-06-04 DIAGNOSIS — I129 Hypertensive chronic kidney disease with stage 1 through stage 4 chronic kidney disease, or unspecified chronic kidney disease: Secondary | ICD-10-CM | POA: Diagnosis not present

## 2019-06-04 DIAGNOSIS — I2510 Atherosclerotic heart disease of native coronary artery without angina pectoris: Secondary | ICD-10-CM | POA: Diagnosis not present

## 2019-06-05 DIAGNOSIS — Z471 Aftercare following joint replacement surgery: Secondary | ICD-10-CM | POA: Diagnosis not present

## 2019-06-05 DIAGNOSIS — I2510 Atherosclerotic heart disease of native coronary artery without angina pectoris: Secondary | ICD-10-CM | POA: Diagnosis not present

## 2019-06-05 DIAGNOSIS — M4722 Other spondylosis with radiculopathy, cervical region: Secondary | ICD-10-CM | POA: Diagnosis not present

## 2019-06-05 DIAGNOSIS — N183 Chronic kidney disease, stage 3 (moderate): Secondary | ICD-10-CM | POA: Diagnosis not present

## 2019-06-05 DIAGNOSIS — E1122 Type 2 diabetes mellitus with diabetic chronic kidney disease: Secondary | ICD-10-CM | POA: Diagnosis not present

## 2019-06-05 DIAGNOSIS — I129 Hypertensive chronic kidney disease with stage 1 through stage 4 chronic kidney disease, or unspecified chronic kidney disease: Secondary | ICD-10-CM | POA: Diagnosis not present

## 2019-06-06 DIAGNOSIS — Z96652 Presence of left artificial knee joint: Secondary | ICD-10-CM | POA: Diagnosis not present

## 2019-06-06 DIAGNOSIS — M6281 Muscle weakness (generalized): Secondary | ICD-10-CM | POA: Diagnosis not present

## 2019-06-06 DIAGNOSIS — Z471 Aftercare following joint replacement surgery: Secondary | ICD-10-CM | POA: Diagnosis not present

## 2019-06-06 DIAGNOSIS — M1712 Unilateral primary osteoarthritis, left knee: Secondary | ICD-10-CM | POA: Diagnosis not present

## 2019-06-06 DIAGNOSIS — M25662 Stiffness of left knee, not elsewhere classified: Secondary | ICD-10-CM | POA: Diagnosis not present

## 2019-06-09 ENCOUNTER — Other Ambulatory Visit: Payer: Self-pay | Admitting: *Deleted

## 2019-06-09 DIAGNOSIS — C642 Malignant neoplasm of left kidney, except renal pelvis: Secondary | ICD-10-CM | POA: Diagnosis not present

## 2019-06-09 NOTE — Patient Outreach (Signed)
[facility]) Care Management  06/09/2019  Jacob Berger [birth year] [id number]   EMMI-general discharge ( [facility])  RED ON EMMI ALERT Day # 4 Date:  Saturday 06/07/19  Red Alert Reason: Read discharge papers? No Taking meds? No Insurance: [employer]  [facility] admissions x 1 Last admission 05/27/19 left knee arthroplasty ED visits x in the last 6 months     Social: 73 y.o. 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 [facility] RN CM left HIPAA compliant voicemail message along with CM's contact info.   Plan: [facility] RN CM sent an unsuccessful outreach letter and scheduled this patient for another call attempt within 4 business days   Kimberlynn Lumbra L. Lavina Hamman, RN, BSN, [place] Coordinator Office number [phone] Mobile number [phone]  Main [facility] number [phone] Fax number [phone]

## 2019-06-10 ENCOUNTER — Other Ambulatory Visit: Payer: Self-pay | Admitting: *Deleted

## 2019-06-10 NOTE — Patient Outreach (Addendum)
  [facility]) Care Management  06/10/2019  [place] Nunley Mcgough 09/08/1946 [id number]   EMMI-general discharge ( [facility])  RED ON EMMI ALERT Day # 4 Date:  Saturday 06/07/19  Red Alert Reason: Read discharge papers? No Taking meds? 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 [facility] RN CM left HIPAA compliant voicemail message along with CM's contact info.    Outreach attempt # 2 B mobile number No answer. [facility] RN CM left HIPAA compliant voicemail message along with CM's contact info.   Plan: [facility] RN CM sent an unsuccessful outreach letter on 06/09/19  [facility] RN CM left messages on 06/09/19 and 06/10/19 [facility] RN CM scheduled this patient for a final call attempt within 4 business days   Leland L. Lavina Hamman, RN, BSN, [place] Coordinator Office number [phone] Mobile number [phone]  Main [facility] number [phone] Fax number [phone]

## 2019-06-11 ENCOUNTER — Other Ambulatory Visit: Payer: Self-pay | Admitting: *Deleted

## 2019-06-11 DIAGNOSIS — Z96652 Presence of left artificial knee joint: Secondary | ICD-10-CM | POA: Diagnosis not present

## 2019-06-11 DIAGNOSIS — M6281 Muscle weakness (generalized): Secondary | ICD-10-CM | POA: Diagnosis not present

## 2019-06-11 DIAGNOSIS — M25662 Stiffness of left knee, not elsewhere classified: Secondary | ICD-10-CM | POA: Diagnosis not present

## 2019-06-11 NOTE — Patient Outreach (Signed)
[facility]) Care Management  06/11/2019  Jacob Berger [birth year] [id number]   EMMI-general discharge( [facility]) RED ON EMMI ALERT Day #4 Date:Saturday 06/07/19 Red Alert Reason:Read discharge papers? No Taking meds? 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 [facility] RN CM left HIPAA compliant voicemail message along with CM's contact info.   Plan:  [facility] RN CM sent an unsuccessful outreach letter on 06/09/19  [facility] RN CM left messages on 06/09/19 , 06/10/19 and 06/11/19 [facility] RN CM scheduled this patient for case closure within 7 business days if no response from pt  Routed note to primary care MD   Joelene Millin L. Lavina Hamman, RN, BSN, [place] Coordinator Office number [phone] Mobile number [phone]  Main [facility] number [phone] Fax number [phone]

## 2019-06-13 DIAGNOSIS — M6281 Muscle weakness (generalized): Secondary | ICD-10-CM | POA: Diagnosis not present

## 2019-06-13 DIAGNOSIS — Z96652 Presence of left artificial knee joint: Secondary | ICD-10-CM | POA: Diagnosis not present

## 2019-06-13 DIAGNOSIS — M25662 Stiffness of left knee, not elsewhere classified: Secondary | ICD-10-CM | POA: Diagnosis not present

## 2019-06-16 DIAGNOSIS — Z96652 Presence of left artificial knee joint: Secondary | ICD-10-CM | POA: Diagnosis not present

## 2019-06-16 DIAGNOSIS — M25662 Stiffness of left knee, not elsewhere classified: Secondary | ICD-10-CM | POA: Diagnosis not present

## 2019-06-16 DIAGNOSIS — C642 Malignant neoplasm of left kidney, except renal pelvis: Secondary | ICD-10-CM | POA: Diagnosis not present

## 2019-06-16 DIAGNOSIS — Z905 Acquired absence of kidney: Secondary | ICD-10-CM | POA: Diagnosis not present

## 2019-06-16 DIAGNOSIS — M6281 Muscle weakness (generalized): Secondary | ICD-10-CM | POA: Diagnosis not present

## 2019-06-20 DIAGNOSIS — Z96652 Presence of left artificial knee joint: Secondary | ICD-10-CM | POA: Diagnosis not present

## 2019-06-20 DIAGNOSIS — M25662 Stiffness of left knee, not elsewhere classified: Secondary | ICD-10-CM | POA: Diagnosis not present

## 2019-06-20 DIAGNOSIS — M6281 Muscle weakness (generalized): Secondary | ICD-10-CM | POA: Diagnosis not present

## 2019-06-23 ENCOUNTER — Other Ambulatory Visit: Payer: Self-pay | Admitting: *Deleted

## 2019-06-23 NOTE — Patient Outreach (Signed)
[facility]) Care Management  06/23/2019  Jacob Berger 07-24-1946 [id number]   Case closure for No response from pt    Original [facility] referral for EMMI-general discharge( [facility]) RED ON EMMI [place] Day #4 Date:Saturday 06/07/19 Red Alert Reason:Read discharge papers? No Taking meds? No Insurance:NextGen Medicare [facility] admissions x1 Last admission 05/27/19 left knee arthroplastyED visits x in the last 6 months   [facility] RN CM sent an unsuccessful outreach letteron 06/09/19  [facility] RN CM left messages on 06/09/19 , 06/10/19 and 06/11/19  without a response   Plan [facility] RN CM will close case after no response from patient within 10 business days. Unable to reach Note to MD/NP  Kimberly L. Lavina Hamman, RN, BSN, [place] Coordinator Office number [phone] Mobile number [phone]  Main [facility] number [phone] Fax number [phone]

## 2019-06-25 DIAGNOSIS — M6281 Muscle weakness (generalized): Secondary | ICD-10-CM | POA: Diagnosis not present

## 2019-06-25 DIAGNOSIS — Z96652 Presence of left artificial knee joint: Secondary | ICD-10-CM | POA: Diagnosis not present

## 2019-06-25 DIAGNOSIS — M25662 Stiffness of left knee, not elsewhere classified: Secondary | ICD-10-CM | POA: Diagnosis not present

## 2019-06-27 DIAGNOSIS — Z85528 Personal history of other malignant neoplasm of kidney: Secondary | ICD-10-CM | POA: Diagnosis not present

## 2019-06-27 DIAGNOSIS — Z8546 Personal history of malignant neoplasm of prostate: Secondary | ICD-10-CM | POA: Diagnosis not present

## 2019-06-27 DIAGNOSIS — Z96652 Presence of left artificial knee joint: Secondary | ICD-10-CM | POA: Diagnosis not present

## 2019-06-27 DIAGNOSIS — M6281 Muscle weakness (generalized): Secondary | ICD-10-CM | POA: Diagnosis not present

## 2019-06-27 DIAGNOSIS — M25662 Stiffness of left knee, not elsewhere classified: Secondary | ICD-10-CM | POA: Diagnosis not present

## 2019-07-02 DIAGNOSIS — Z96652 Presence of left artificial knee joint: Secondary | ICD-10-CM | POA: Diagnosis not present

## 2019-07-02 DIAGNOSIS — M6281 Muscle weakness (generalized): Secondary | ICD-10-CM | POA: Diagnosis not present

## 2019-07-02 DIAGNOSIS — M25662 Stiffness of left knee, not elsewhere classified: Secondary | ICD-10-CM | POA: Diagnosis not present

## 2019-07-03 ENCOUNTER — Encounter: Payer: Self-pay | Admitting: Urology

## 2019-07-04 DIAGNOSIS — M25662 Stiffness of left knee, not elsewhere classified: Secondary | ICD-10-CM | POA: Diagnosis not present

## 2019-07-04 DIAGNOSIS — M6281 Muscle weakness (generalized): Secondary | ICD-10-CM | POA: Diagnosis not present

## 2019-07-04 DIAGNOSIS — Z96652 Presence of left artificial knee joint: Secondary | ICD-10-CM | POA: Diagnosis not present

## 2019-07-07 DIAGNOSIS — M25662 Stiffness of left knee, not elsewhere classified: Secondary | ICD-10-CM | POA: Diagnosis not present

## 2019-07-07 DIAGNOSIS — M6281 Muscle weakness (generalized): Secondary | ICD-10-CM | POA: Diagnosis not present

## 2019-07-07 DIAGNOSIS — Z96652 Presence of left artificial knee joint: Secondary | ICD-10-CM | POA: Diagnosis not present

## 2019-07-09 ENCOUNTER — Other Ambulatory Visit: Payer: Self-pay

## 2019-07-09 ENCOUNTER — Ambulatory Visit: Payer: Medicare Other | Admitting: Surgery

## 2019-07-11 DIAGNOSIS — M6281 Muscle weakness (generalized): Secondary | ICD-10-CM | POA: Diagnosis not present

## 2019-07-11 DIAGNOSIS — M25662 Stiffness of left knee, not elsewhere classified: Secondary | ICD-10-CM | POA: Diagnosis not present

## 2019-07-11 DIAGNOSIS — Z96652 Presence of left artificial knee joint: Secondary | ICD-10-CM | POA: Diagnosis not present

## 2019-07-15 ENCOUNTER — Encounter: Payer: Self-pay | Admitting: Surgery

## 2019-07-15 ENCOUNTER — Other Ambulatory Visit: Payer: Self-pay

## 2019-07-15 ENCOUNTER — Telehealth (INDEPENDENT_AMBULATORY_CARE_PROVIDER_SITE_OTHER): Payer: Medicare Other | Admitting: Surgery

## 2019-07-15 DIAGNOSIS — I2510 Atherosclerotic heart disease of native coronary artery without angina pectoris: Secondary | ICD-10-CM | POA: Diagnosis not present

## 2019-07-15 DIAGNOSIS — Z9861 Coronary angioplasty status: Secondary | ICD-10-CM | POA: Diagnosis not present

## 2019-07-15 DIAGNOSIS — Z96652 Presence of left artificial knee joint: Secondary | ICD-10-CM | POA: Diagnosis not present

## 2019-07-15 DIAGNOSIS — M25662 Stiffness of left knee, not elsewhere classified: Secondary | ICD-10-CM | POA: Diagnosis not present

## 2019-07-15 DIAGNOSIS — M6281 Muscle weakness (generalized): Secondary | ICD-10-CM | POA: Diagnosis not present

## 2019-07-15 NOTE — Progress Notes (Signed)
[address]       ,[place] [address]             [phone]     CARDIOTHORACIC SURGERY TELEPHONE VIRTUAL OFFICE NOTE  Referring Provider is Raynelle Bring, MD Primary Cardiologist is Glenetta Hew, MD PCP is Merrilee Seashore, MD   HPI:  I spoke with Jacob Berger (DOB 1945/12/12 ) via telephone on 07/15/2019 at 3:15 PM and verified that I was speaking with the correct person using more than one form of identification.  We discussed the reason(s) for conducting our visit virtually instead of in-person.  The patient expressed understanding the circumstances and agreed to proceed as described.   The patient has a 73 year old gentleman with a history of coronary disease, hypertension, type 2 diabetes, and OSA who I been following with a fusiform ascending aortic aneurysm.  When I saw him 2 years ago his CT scan showed a maximum diameter of the ascending aorta measuring 4.5 cm.  Then in May 2019 CT scan showed the maximum diameter to be measured at 4.7 cm although the radiologist thought that it is probably the same as your before.  Since I last saw him he has been feeling well.  He denies any chest or back pain.  He continues to be followed by Dr. Glenetta Hew from a cardiology standpoint.  He recently underwent a left total knee replacement in July 2020 and is recovering from that.  He recently had a CTA of the chest and abdomen performed at the [facility] urology office for follow-up of his renal cell carcinoma.  This showed the maximum diameter of the ascending aorta measured 4.5 cm by report.   Current Outpatient Medications  Medication Sig Dispense Refill  . allopurinol (ZYLOPRIM) 300 MG tablet Take 300 mg by mouth daily.     Marland Kitchen aspirin EC 81 MG tablet Take 1 tablet (81 mg total) by mouth 2 (two) times daily after a meal. 30 tablet 0  . AZOR 10-40 MG per tablet TAKE 1 TABLET BY MOUTH DAILY 90 tablet 2  . bimatoprost (LUMIGAN) 0.01 % SOLN Place 1 drop into  both eyes at bedtime.    . Carboxymethylcellulose Sodium (REFRESH LIQUIGEL) 1 % GEL Place 1 drop into the left eye as needed (eye irritation).    . diphenhydrAMINE (BENADRYL) 25 MG tablet Take 1 tablet (25 mg total) by mouth every 6 (six) hours as needed for itching. 20 tablet 0  . diphenhydramine-acetaminophen (TYLENOL PM) 25-500 MG TABS tablet Take 1 tablet by mouth at bedtime as needed (sleep).    . Dulaglutide (TRULICITY) 1.5 0000000 SOPN Inject 1.5 mg into the skin every Thursday. In the afternoon 2pm    . fenofibrate (TRICOR) 145 MG tablet Take 145 mg by mouth daily at 2 PM.     . furosemide (LASIX) 80 MG tablet Take 20-40 mg by mouth daily as needed (for fluid).    Marland Kitchen HYDROcodone-acetaminophen (NORCO/VICODIN) 5-325 MG tablet Take 1-2 tablets by mouth every 6 (six) hours as needed for moderate pain (pain score 4-6). 40 tablet 0  . LEVEMIR FLEXTOUCH 100 UNIT/ML Pen Inject 43 Units into the skin at bedtime.     . Methylcellulose, Laxative, (CITRUCEL) 500 MG TABS Take 500 mg by mouth daily.     Marland Kitchen moxifloxacin (VIGAMOX) 0.5 % ophthalmic solution INSTILL 1 DROP INTO THE LEFT EYE 4 TIMES A DAY FOR 14 DAYS    . Multiple Vitamins-Minerals (EMERGEN-C IMMUNE PO) Take 1  tablet by mouth daily.    . nitroGLYCERIN (NITROSTAT) 0.4 MG SL tablet Place 1 tablet (0.4 mg total) under the tongue every 5 (five) minutes as needed for chest pain. 25 tablet 2  . NOVOLOG FLEXPEN 100 UNIT/ML FlexPen Inject 15 Units into the skin 3 (three) times daily before meals.     . prednisoLONE acetate (PRED FORTE) 1 % ophthalmic suspension Place 1 drop into the left eye 6 (six) times daily.    Keene Breath COQ10/UBIQUINOL/MEGA PO Take 20 mLs by mouth 2 (two) times a week.    . sildenafil (VIAGRA) 25 MG tablet TAKE 1  TO 2 TABLET  AS NEEDED ,FOR ERECTILE DYSFUNCTION 10 tablet 11  . timolol (TIMOPTIC) 0.5 % ophthalmic solution Place 1 drop into the right eye 2 (two) times daily. Morning and afternoon  5  . tiZANidine (ZANAFLEX) 4 MG  tablet Take 1 tablet (4 mg total) by mouth every 6 (six) hours as needed. 40 tablet 1  . VYTORIN 10-40 MG per tablet Take 1 tablet by mouth at bedtime.      No current facility-administered medications for this visit.    Facility-Administered Medications Ordered in Other Visits  Medication Dose Route Frequency Provider Last Rate Last Dose  . 0.9 %  sodium chloride infusion   Intra-arterial PRN Ditty, Kevan Ny, MD         Diagnostic Tests:  CT scan of the chest and abdomen done at [facility] urology showed the maximum diameter of the ascending aortic aneurysm to measure 4.5 cm.   Impression:  He has a stable 4.5 cm fusiform ascending aortic aneurysm.  This is still well below the surgical threshold of 5.5 cm.  I discussed the results of the study compared with the previous CT scans with him and answered all of his questions.  I stressed the importance of continued good blood pressure control in preventing further enlargement and acute aortic dissection.  Plan:  He is scheduled to have a repeat CT scan of the chest and abdomen in 1 year by his urologist for follow-up of his renal cell carcinoma.  His ascending aortic aneurysm will be assessed on that study.  He will let me know when that study has been performed so that we can follow-up on the result.  I told him that I would be happy to call him at that time discussed the study with him or see him back in the office in person to review the study.    Gaye Pollack, MD 07/15/2019

## 2019-07-18 DIAGNOSIS — Z96652 Presence of left artificial knee joint: Secondary | ICD-10-CM | POA: Diagnosis not present

## 2019-07-18 DIAGNOSIS — E782 Mixed hyperlipidemia: Secondary | ICD-10-CM | POA: Diagnosis not present

## 2019-07-18 DIAGNOSIS — I2510 Atherosclerotic heart disease of native coronary artery without angina pectoris: Secondary | ICD-10-CM | POA: Diagnosis not present

## 2019-07-18 DIAGNOSIS — M25662 Stiffness of left knee, not elsewhere classified: Secondary | ICD-10-CM | POA: Diagnosis not present

## 2019-07-18 DIAGNOSIS — E1165 Type 2 diabetes mellitus with hyperglycemia: Secondary | ICD-10-CM | POA: Diagnosis not present

## 2019-07-18 DIAGNOSIS — Z23 Encounter for immunization: Secondary | ICD-10-CM | POA: Diagnosis not present

## 2019-07-18 DIAGNOSIS — M6281 Muscle weakness (generalized): Secondary | ICD-10-CM | POA: Diagnosis not present

## 2019-07-21 ENCOUNTER — Encounter: Payer: Self-pay | Admitting: Urology

## 2019-07-21 DIAGNOSIS — Z96652 Presence of left artificial knee joint: Secondary | ICD-10-CM | POA: Diagnosis not present

## 2019-07-21 DIAGNOSIS — M6281 Muscle weakness (generalized): Secondary | ICD-10-CM | POA: Diagnosis not present

## 2019-07-21 DIAGNOSIS — M25662 Stiffness of left knee, not elsewhere classified: Secondary | ICD-10-CM | POA: Diagnosis not present

## 2019-07-24 DIAGNOSIS — E1165 Type 2 diabetes mellitus with hyperglycemia: Secondary | ICD-10-CM | POA: Diagnosis not present

## 2019-07-24 DIAGNOSIS — I129 Hypertensive chronic kidney disease with stage 1 through stage 4 chronic kidney disease, or unspecified chronic kidney disease: Secondary | ICD-10-CM | POA: Diagnosis not present

## 2019-07-24 DIAGNOSIS — I119 Hypertensive heart disease without heart failure: Secondary | ICD-10-CM | POA: Diagnosis not present

## 2019-07-24 DIAGNOSIS — Z7189 Other specified counseling: Secondary | ICD-10-CM | POA: Diagnosis not present

## 2019-07-25 DIAGNOSIS — M6281 Muscle weakness (generalized): Secondary | ICD-10-CM | POA: Diagnosis not present

## 2019-07-25 DIAGNOSIS — Z96652 Presence of left artificial knee joint: Secondary | ICD-10-CM | POA: Diagnosis not present

## 2019-07-25 DIAGNOSIS — M25662 Stiffness of left knee, not elsewhere classified: Secondary | ICD-10-CM | POA: Diagnosis not present

## 2019-07-29 DIAGNOSIS — Z96652 Presence of left artificial knee joint: Secondary | ICD-10-CM | POA: Diagnosis not present

## 2019-07-29 DIAGNOSIS — M6281 Muscle weakness (generalized): Secondary | ICD-10-CM | POA: Diagnosis not present

## 2019-07-29 DIAGNOSIS — M25662 Stiffness of left knee, not elsewhere classified: Secondary | ICD-10-CM | POA: Diagnosis not present

## 2019-08-01 DIAGNOSIS — Z96652 Presence of left artificial knee joint: Secondary | ICD-10-CM | POA: Diagnosis not present

## 2019-08-01 DIAGNOSIS — M25662 Stiffness of left knee, not elsewhere classified: Secondary | ICD-10-CM | POA: Diagnosis not present

## 2019-08-01 DIAGNOSIS — M6281 Muscle weakness (generalized): Secondary | ICD-10-CM | POA: Diagnosis not present

## 2019-08-04 DIAGNOSIS — Z96652 Presence of left artificial knee joint: Secondary | ICD-10-CM | POA: Diagnosis not present

## 2019-08-04 DIAGNOSIS — M25662 Stiffness of left knee, not elsewhere classified: Secondary | ICD-10-CM | POA: Diagnosis not present

## 2019-08-04 DIAGNOSIS — M6281 Muscle weakness (generalized): Secondary | ICD-10-CM | POA: Diagnosis not present

## 2019-08-08 DIAGNOSIS — M25662 Stiffness of left knee, not elsewhere classified: Secondary | ICD-10-CM | POA: Diagnosis not present

## 2019-08-08 DIAGNOSIS — Z96652 Presence of left artificial knee joint: Secondary | ICD-10-CM | POA: Diagnosis not present

## 2019-08-08 DIAGNOSIS — M6281 Muscle weakness (generalized): Secondary | ICD-10-CM | POA: Diagnosis not present

## 2019-08-12 DIAGNOSIS — M6281 Muscle weakness (generalized): Secondary | ICD-10-CM | POA: Diagnosis not present

## 2019-08-12 DIAGNOSIS — Z96652 Presence of left artificial knee joint: Secondary | ICD-10-CM | POA: Diagnosis not present

## 2019-08-12 DIAGNOSIS — M25662 Stiffness of left knee, not elsewhere classified: Secondary | ICD-10-CM | POA: Diagnosis not present

## 2019-08-13 ENCOUNTER — Encounter: Payer: Self-pay | Admitting: Surgery

## 2019-08-13 NOTE — Progress Notes (Signed)
I spent 10 minutes on a telemedicine visit with the patient on 07/15/2019 discussing his recent CTA of the chest evaluating his ascending aortic aneurysm.

## 2019-08-15 DIAGNOSIS — M25662 Stiffness of left knee, not elsewhere classified: Secondary | ICD-10-CM | POA: Diagnosis not present

## 2019-08-15 DIAGNOSIS — Z96652 Presence of left artificial knee joint: Secondary | ICD-10-CM | POA: Diagnosis not present

## 2019-08-15 DIAGNOSIS — M6281 Muscle weakness (generalized): Secondary | ICD-10-CM | POA: Diagnosis not present

## 2019-08-18 DIAGNOSIS — M25662 Stiffness of left knee, not elsewhere classified: Secondary | ICD-10-CM | POA: Diagnosis not present

## 2019-08-18 DIAGNOSIS — M6281 Muscle weakness (generalized): Secondary | ICD-10-CM | POA: Diagnosis not present

## 2019-08-18 DIAGNOSIS — Z96652 Presence of left artificial knee joint: Secondary | ICD-10-CM | POA: Diagnosis not present

## 2019-08-20 DIAGNOSIS — I10 Essential (primary) hypertension: Secondary | ICD-10-CM | POA: Diagnosis not present

## 2019-08-20 DIAGNOSIS — I119 Hypertensive heart disease without heart failure: Secondary | ICD-10-CM | POA: Diagnosis not present

## 2019-08-20 DIAGNOSIS — E782 Mixed hyperlipidemia: Secondary | ICD-10-CM | POA: Diagnosis not present

## 2019-08-20 DIAGNOSIS — Z794 Long term (current) use of insulin: Secondary | ICD-10-CM | POA: Diagnosis not present

## 2019-08-20 DIAGNOSIS — I129 Hypertensive chronic kidney disease with stage 1 through stage 4 chronic kidney disease, or unspecified chronic kidney disease: Secondary | ICD-10-CM | POA: Diagnosis not present

## 2019-08-20 DIAGNOSIS — E1121 Type 2 diabetes mellitus with diabetic nephropathy: Secondary | ICD-10-CM | POA: Diagnosis not present

## 2019-08-20 DIAGNOSIS — E1140 Type 2 diabetes mellitus with diabetic neuropathy, unspecified: Secondary | ICD-10-CM | POA: Diagnosis not present

## 2019-08-20 DIAGNOSIS — Z7189 Other specified counseling: Secondary | ICD-10-CM | POA: Diagnosis not present

## 2019-08-20 DIAGNOSIS — N183 Chronic kidney disease, stage 3 unspecified: Secondary | ICD-10-CM | POA: Diagnosis not present

## 2019-08-20 DIAGNOSIS — I2510 Atherosclerotic heart disease of native coronary artery without angina pectoris: Secondary | ICD-10-CM | POA: Diagnosis not present

## 2019-08-20 DIAGNOSIS — Z6838 Body mass index (BMI) 38.0-38.9, adult: Secondary | ICD-10-CM | POA: Diagnosis not present

## 2019-08-20 DIAGNOSIS — G4733 Obstructive sleep apnea (adult) (pediatric): Secondary | ICD-10-CM | POA: Diagnosis not present

## 2019-08-22 DIAGNOSIS — M6281 Muscle weakness (generalized): Secondary | ICD-10-CM | POA: Diagnosis not present

## 2019-08-22 DIAGNOSIS — M25662 Stiffness of left knee, not elsewhere classified: Secondary | ICD-10-CM | POA: Diagnosis not present

## 2019-08-22 DIAGNOSIS — Z96652 Presence of left artificial knee joint: Secondary | ICD-10-CM | POA: Diagnosis not present

## 2019-08-25 DIAGNOSIS — M6281 Muscle weakness (generalized): Secondary | ICD-10-CM | POA: Diagnosis not present

## 2019-08-25 DIAGNOSIS — M25662 Stiffness of left knee, not elsewhere classified: Secondary | ICD-10-CM | POA: Diagnosis not present

## 2019-08-25 DIAGNOSIS — Z96652 Presence of left artificial knee joint: Secondary | ICD-10-CM | POA: Diagnosis not present

## 2019-08-29 DIAGNOSIS — M6281 Muscle weakness (generalized): Secondary | ICD-10-CM | POA: Diagnosis not present

## 2019-08-29 DIAGNOSIS — M25662 Stiffness of left knee, not elsewhere classified: Secondary | ICD-10-CM | POA: Diagnosis not present

## 2019-08-29 DIAGNOSIS — Z96652 Presence of left artificial knee joint: Secondary | ICD-10-CM | POA: Diagnosis not present

## 2019-09-01 DIAGNOSIS — M6281 Muscle weakness (generalized): Secondary | ICD-10-CM | POA: Diagnosis not present

## 2019-09-01 DIAGNOSIS — M25662 Stiffness of left knee, not elsewhere classified: Secondary | ICD-10-CM | POA: Diagnosis not present

## 2019-09-01 DIAGNOSIS — Z96652 Presence of left artificial knee joint: Secondary | ICD-10-CM | POA: Diagnosis not present

## 2019-09-05 DIAGNOSIS — M6281 Muscle weakness (generalized): Secondary | ICD-10-CM | POA: Diagnosis not present

## 2019-09-05 DIAGNOSIS — Z96652 Presence of left artificial knee joint: Secondary | ICD-10-CM | POA: Diagnosis not present

## 2019-09-05 DIAGNOSIS — M25662 Stiffness of left knee, not elsewhere classified: Secondary | ICD-10-CM | POA: Diagnosis not present

## 2019-09-09 DIAGNOSIS — Z96652 Presence of left artificial knee joint: Secondary | ICD-10-CM | POA: Diagnosis not present

## 2019-09-09 DIAGNOSIS — M6281 Muscle weakness (generalized): Secondary | ICD-10-CM | POA: Diagnosis not present

## 2019-09-09 DIAGNOSIS — M25662 Stiffness of left knee, not elsewhere classified: Secondary | ICD-10-CM | POA: Diagnosis not present

## 2019-09-29 ENCOUNTER — Telehealth: Payer: Self-pay | Admitting: Cardiology

## 2019-09-29 NOTE — Telephone Encounter (Signed)
New Message  Pt states that he is experiencing chills and body aches. Has appt scheduled for 09/30/19. Has not taken Covid-19 test.

## 2019-09-29 NOTE — Telephone Encounter (Signed)
Spoke to patient . Patient states he is having chills and fever wanted to know if he should come for a 09/30/19 .  patient states he has not had a covid test .   RN informed patient to contact primary doctor for further instruction regarding chills and fever.   Will cancel 09/30/19 appointment for now - reschedule for virtual appointment 10/15/19 at 9 am   Patient states he has been having some palp.- informed patient Dr Ellyn Hack will be able to discuss symptoms and order any testing that is needed.   patient states he will be able to do virtual appointment via telephone only .  Patient verbalized understanding.

## 2019-09-30 ENCOUNTER — Ambulatory Visit: Payer: Medicare Other | Admitting: Cardiology

## 2019-10-14 ENCOUNTER — Telehealth (INDEPENDENT_AMBULATORY_CARE_PROVIDER_SITE_OTHER): Payer: Medicare Other | Admitting: Cardiology

## 2019-10-14 ENCOUNTER — Encounter: Payer: Self-pay | Admitting: Cardiology

## 2019-10-14 ENCOUNTER — Telehealth: Payer: Self-pay | Admitting: *Deleted

## 2019-10-14 VITALS — Ht 72.0 in | Wt 271.0 lb

## 2019-10-14 DIAGNOSIS — Z9861 Coronary angioplasty status: Secondary | ICD-10-CM

## 2019-10-14 DIAGNOSIS — I712 Thoracic aortic aneurysm, without rupture, unspecified: Secondary | ICD-10-CM

## 2019-10-14 DIAGNOSIS — M7989 Other specified soft tissue disorders: Secondary | ICD-10-CM

## 2019-10-14 DIAGNOSIS — E785 Hyperlipidemia, unspecified: Secondary | ICD-10-CM

## 2019-10-14 DIAGNOSIS — Z794 Long term (current) use of insulin: Secondary | ICD-10-CM

## 2019-10-14 DIAGNOSIS — I119 Hypertensive heart disease without heart failure: Secondary | ICD-10-CM

## 2019-10-14 DIAGNOSIS — I2510 Atherosclerotic heart disease of native coronary artery without angina pectoris: Secondary | ICD-10-CM

## 2019-10-14 DIAGNOSIS — R2242 Localized swelling, mass and lump, left lower limb: Secondary | ICD-10-CM

## 2019-10-14 DIAGNOSIS — E119 Type 2 diabetes mellitus without complications: Secondary | ICD-10-CM | POA: Diagnosis not present

## 2019-10-14 DIAGNOSIS — Z7982 Long term (current) use of aspirin: Secondary | ICD-10-CM | POA: Diagnosis not present

## 2019-10-14 DIAGNOSIS — R079 Chest pain, unspecified: Secondary | ICD-10-CM

## 2019-10-14 MED ORDER — NITROGLYCERIN 0.4 MG SL SUBL: 0.4000 mg | SUBLINGUAL_TABLET | SUBLINGUAL | 3 refills | Status: DC | PRN

## 2019-10-14 MED ORDER — SILDENAFIL CITRATE 25 MG PO TABS: ORAL_TABLET | ORAL | 11 refills | Status: DC

## 2019-10-14 NOTE — Assessment & Plan Note (Signed)
Unilateral ankle edema in a patient who recently had surgery on that leg.  Need to exclude DVT. Plan: Check lower extremity venous Doppler.

## 2019-10-14 NOTE — Assessment & Plan Note (Signed)
Labs have been well controlled.  Unfortunate I do not have the full panel from his PCP.  All I can see is that the triglycerides look good.  Previous labs is a 2017 and 2018 looks great on his Vytorin.  No change.

## 2019-10-14 NOTE — Assessment & Plan Note (Signed)
Due to have follow-up CT scan that was ordered by Dr. Cyndia Bent. Continue blood pressure and lipid management.

## 2019-10-14 NOTE — Assessment & Plan Note (Signed)
Atypical chest discomfort seems to be pretty persistent.  Recent stress test was negative for similar symptoms.  Continue to monitor, but at this point I would not progress to further testing.

## 2019-10-14 NOTE — Telephone Encounter (Signed)
SPOKE TO PATIENT.  INSTRUCTION WAS GIVEN FROM TODAY'S VIRTUAL VISIT 10/14/19.  AVS SUMMARY IS MAILED.   Labs were obtained from primary . Patient aware scheduler will contact him about DVT doppler.   PATIENT VERBALIZED UNDERSTANDING.

## 2019-10-14 NOTE — Assessment & Plan Note (Signed)
He tells me that his blood pressures have been relatively well controlled at his PCPs visit.  Continue the combination of ARB-calcium channel blocker.  Reassess potential benefit of restarting beta-blocker if blood pressure increases.

## 2019-10-14 NOTE — Assessment & Plan Note (Signed)
Followed by PCP.  Uses freestyle libre detection monitor.

## 2019-10-14 NOTE — Patient Instructions (Addendum)
Medication Instructions:   We will refill your Viagra/sildenafil Do not use nitroglycerin tablets  At least 48 hours prior or after the use of Viagra/Sildeafil  *If you need a refill on your cardiac medications before your next appointment, please call your pharmacy*  Lab Work:  We will contact your primary care's office to get lab results.   Testing/Procedures: Will be schedule at [place] [address] Left lower leg vein Dopplers to look for clot clot  Your physician has requested that you have a lower extremity venous duplex-DVT. This test is an ultrasound of the veins in the legs. It looks at venous blood flow that carries blood from the heart to the legs. Allow one hour for a Lower Venous exam.  There are no restrictions or special instructions.    Follow-Up: At [facility], you and your health needs are our priority.  As part of our continuing mission to provide you with exceptional heart care, we have created designated Provider Care Teams.  These Care Teams include your primary Cardiologist (physician) and Advanced Practice Providers (APPs -  Physician Assistants and Nurse Practitioners) who all work together to provide you with the care you need, when you need it.  Your next appointment:   5 month(s)-May 2021  The format for your next appointment:   In Person  Provider:   Glenetta Hew, MD  Other Instructions Continue to exercise

## 2019-10-14 NOTE — Assessment & Plan Note (Signed)
Distant history of DES stent.  Had a negative Myoview in July 2018 and yet again recently here in April 2020 for similar symptoms to what he is feeling right now.  He is on aspirin-no longer on Brilinta.

## 2019-10-14 NOTE — Assessment & Plan Note (Signed)
I am reluctant to think that the symptoms he is having currently are related to angina.  He was having similar symptoms this spring and had a negative Myoview.  Prolonged constant chest pain not exacerbated with exertion is not likely to be cardiac in nature.  Most likely musculoskeletal.  Plan: Continue aspirin and combination ARB-calcium channel blocker. No longer on a beta-blocker, but doing relatively well.  We chose to avoid restarting it to avoid fatigue.

## 2019-10-14 NOTE — Progress Notes (Signed)
Virtual Visit via Telephone Note   This visit type was conducted due to national recommendations for restrictions regarding the COVID-19 Pandemic (e.g. social distancing) in an effort to limit this patient's exposure and mitigate transmission in our community.  Due to his co-morbid illnesses, this patient is at least at moderate risk for complications without adequate follow up.  This format is felt to be most appropriate for this patient at this time.  The patient did not have access to video technology/had technical difficulties with video requiring transitioning to audio format only (telephone).  All issues noted in this document were discussed and addressed.  No physical exam could be performed with this format.  Please refer to the patient's chart for his  consent to telehealth for [facility].   Patient has given verbal permission to conduct this visit via virtual appointment and to bill insurance 10/14/2019 12:16 PM     Evaluation Performed:  Follow-up visit  Date:  10/14/2019   ID:  Jacob Berger, Jacob Berger 11-10-1945, MRN [id number]  Patient Location: Home Provider Location: Other:  Hospital Office  PCP:  Merrilee Seashore, MD  Cardiologist:  Glenetta Hew, MD  Electrophysiologist:  None   Chief Complaint:   Chief Complaint  Patient presents with   Follow-up    Annual   Coronary Artery Disease    Has almost chronic chest pain, not worse with exertion   Leg Swelling    Left ankle, has gotten worse over the last week or so.  Is s/p left knee surgery.    History of Present Illness:    Jacob Berger is a 73 y.o. male with PMH notable for CAD -PCI (RCA 2011), hypertensive heart disease, thoracic aortic aneurysm (4.8 cm-followed by Dr. 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  Jacob Berger was last seen on May 17 by Rosaria Ferries, PA  for preop evaluation for left knee surgery. Was noted to have a new CPAP machine.  Was having occasional chest tightness that would wake him up in the morning.  Sometimes when he went to sleep-but not with exertion.  Was evaluated with a Myoview stress test.  Hospitalizations:   For Knee Sgx   Prior CV studies:   The following studies were reviewed today:  Myoview February 12, 2019: (Preop knee surgery) normal EF 55-65%.  No ischemia or infarction.  LOW RISK.Jacob Berger History   Jacob Berger tells me he is doing relatively well for the last several months since his last visit.  He is starting to get more mobility with his left leg.  He is noting a little bit of swelling in the left ankle though over the last couple weeks.  Is a little sore, and did not notice this when he first had his surgery.  He walks about a half a mile to mile a day and does not really have any notable symptoms.  We will when he has his over the last several months off and on prolonged episodes of discomfort in the left side of his chest is there almost all day long.  It does not really get worse with any activity or any motion.  When he does his walking he also does some incline push-ups and does not notice anything worse during these activities.  The symptom is constant, not waxing and waning and not associated with exertional dyspnea.  Otherwise he may get a little bit orthopnea because he uses CPAP, but no issues with CPAP.  Minimal edema just the left ankle swelling which is little bit bothersome to him now.  He denies any other active cardiac symptoms.  Cardiovascular ROS: positive for - chest pain, palpitations and Left ankle swelling.  The palpitations are really just a few skipped beats here and there negative for - edema, irregular heartbeat, paroxysmal nocturnal dyspnea, rapid heart rate, shortness of breath or Syncope/near syncope, TIA/amaurosis fugax, claudication   ROS:  Please see the history of  present illness.    The patient does not have symptoms concerning for COVID-19 infection (fever, chills, cough, or new shortness of breath).   Had Chills one night - therefore appt done virtual. No further episodes. Review of Systems  Constitutional: Negative for malaise/fatigue and weight loss.  HENT: Negative for congestion.   Respiratory: Negative for cough and shortness of breath.   Cardiovascular: Positive for chest pain (Per HPI) and leg swelling (Left ankle).  Gastrointestinal: Negative for blood in stool and melena.  Genitourinary: Negative for hematuria.  Musculoskeletal: Positive for joint pain (Knee is still stiff, but getting mobility back).  Neurological: Negative for dizziness.  Psychiatric/Behavioral: Negative for memory loss. The patient is not nervous/anxious and does not have insomnia.   All other systems reviewed and are negative.   The patient is practicing social distancing.  Past Medical History:  Diagnosis Date   Arthritis    "knees" (05/10/2016)   CAD S/P percutaneous coronary angioplasty 2011   a. 2011: s/p PCI to PDA with Endeavor 2.5 mm x 12 mm DES - [place] [facility] b. low-risk NST in 07/2015   Cancer of kidney [facility])    Chronic kidney disease    Daily headache    "recently" (05/10/2016)   Dyslipidemia, goal LDL below 70    Glaucoma    Gout    History of kidney stones    Hypertension, essential    Obesity, Class II, BMI 35-39.9, with comorbidity    BMI 37   OSA on CPAP    PONV (postoperative nausea and vomiting)    after colonoscopy   Prostate cancer ([place]) 2007   Rash since 04-08-16   on neck healing, right arm improving   Thoracic aortic aneurysm without rupture ([place]) - Noted on chest CT. 4.5 cm 02/15/2016   4.5 cm per dr Cyndia Bent note and [facility] 01-18-16   Type II diabetes mellitus [facility])     Past Surgical History:  Procedure Laterality Date   ANTERIOR CERVICAL DECOMP/DISCECTOMY FUSION N/A 11/09/2017   Procedure:  Cervical four-five, Cervical five-six Anterior discectomy with fusion and plate fixation;  Surgeon: Ditty, Kevan Ny, MD;  Location: [facility];  Service: Neurosurgery;  Laterality: N/A;   COLONOSCOPY W/ BIOPSIES AND POLYPECTOMY  "several"   CORONARY ANGIOPLASTY WITH STENT PLACEMENT  2011   [place]: Endeavor 2.5 mm x 12 mm DES - distal PDA ([facility] [place] [facility])   EYE SURGERY     LAPAROSCOPIC NEPHRECTOMY Left 04/13/2016   Procedure: LAPAROSCOPIC RADICAL LEFT NEPHRECTOMY;  Surgeon: Raynelle Bring, MD;  Location: [facility];  Service: Urology;  Laterality: Left;   NM MYOVIEW ([place] [facility])  6/'13, 3/'16   a. Treadmill Myoview: 10 minutes, 12 METS; no ischemia infarction, EF 65%; b. [facility]: LOW RISK. EF 57%. 10.4 METS low risk scan no ischemia. No wall motion abnormality.    NM MYOVIEW LTD  05/2017   EF 50%. Normal  function. No ischemia or infarction.   PROSTATECTOMY     REFRACTIVE SURGERY Left    TOTAL KNEE ARTHROPLASTY Left 05/27/2019   Procedure: Left Knee Arthroplasty;  Surgeon: Melrose Nakayama, MD;  Location: [facility];  Service: Orthopedics;  Laterality: Left;     Current Meds  Medication Sig   allopurinol (ZYLOPRIM) 300 MG tablet Take 300 mg by mouth daily.    aspirin EC 81 MG tablet Take 81 mg by mouth daily.   AZOR 10-40 MG per tablet TAKE 1 TABLET BY MOUTH DAILY   bimatoprost (LUMIGAN) 0.01 % SOLN Place 1 drop into the right eye at bedtime.    Carboxymethylcellulose Sodium (REFRESH LIQUIGEL) 1 % GEL Place 1 drop into the left eye as needed (eye irritation).   diphenhydrAMINE (BENADRYL) 25 MG tablet Take 1 tablet (25 mg total) by mouth every 6 (six) hours as needed for itching.   diphenhydramine-acetaminophen (TYLENOL PM) 25-500 MG TABS tablet Take 1 tablet by mouth at bedtime as needed (sleep).   Dulaglutide (TRULICITY) 1.5 0000000 SOPN Inject 1.5 mg into the skin every Thursday. In the afternoon 2pm   fenofibrate (TRICOR) 145 MG tablet Take 145 mg by mouth daily at 2 PM.     furosemide (LASIX) 80 MG tablet Take 20-40 mg by mouth daily as needed (for fluid).   LEVEMIR FLEXTOUCH 100 UNIT/ML Pen Inject 38 Units into the skin at bedtime.    Methylcellulose, Laxative, (CITRUCEL) 500 MG TABS Take 500 mg by mouth daily.    Multiple Vitamins-Minerals (EMERGEN-C IMMUNE PO) Take 1 tablet by mouth daily.   nitroGLYCERIN (NITROSTAT) 0.4 MG SL tablet Place 1 tablet (0.4 mg total) under the tongue every 5 (five) minutes as needed for chest pain.   NOVOLOG FLEXPEN 100 UNIT/ML FlexPen Inject 15 Units into the skin 3 (three) times daily before meals.    QUNOL COQ10/UBIQUINOL/MEGA PO Take 20 mLs by mouth 2 (two) times a week.   sildenafil (VIAGRA) 25 MG tablet TAKE 1  TO 2 TABLET  AS NEEDED ,FOR ERECTILE DYSFUNCTION   timolol (TIMOPTIC) 0.5 % ophthalmic solution Place 1 drop into both eyes 2 (two) times daily. Morning and afternoon   tiZANidine (ZANAFLEX) 4 MG tablet Take 1 tablet (4 mg total) by mouth every 6 (six) hours as needed.   VYTORIN 10-40 MG per tablet Take 1 tablet by mouth at bedtime.    [DISCONTINUED] nitroGLYCERIN (NITROSTAT) 0.4 MG SL tablet Place 1 tablet (0.4 mg total) under the tongue every 5 (five) minutes as needed for chest pain.   [DISCONTINUED] sildenafil (VIAGRA) 25 MG tablet TAKE 1  TO 2 TABLET  AS NEEDED ,FOR ERECTILE DYSFUNCTION     Allergies:   Accupril [quinapril hcl]   Social History   Tobacco Use   Smoking status: Former Smoker    Types: Cigarettes    Quit date: 06/04/1993    Years since quitting: 26.3   Smokeless tobacco: Never Used   Tobacco comment: A PK WOULD LAST A WEEK  Substance Use Topics   Alcohol use: Yes    Alcohol/week: 2.0 standard drinks    Types: 1 Glasses of wine, 1 Cans of beer per week    Comment: rare   Drug use: No     Family Hx: The patient's family history includes Hypertension in his son.   Labs/Other Tests and Data Reviewed:    EKG:  No ECG reviewed.  Recent Labs: 05/23/2019: BUN 29;  Creatinine, Ser 1.81; Hemoglobin 12.4; Platelets 216; Potassium 4.0; Sodium 140  Recent Lipid Panel -= PCP follows (recently checked) - has Libre' glucose monitor Lab Results  Component Value Date/Time   CHOL 105 05/27/2017 05:39 AM   TRIG 224 (H) 05/27/2017 05:39 AM   HDL 25 (L) 05/27/2017 05:39 AM   CHOLHDL 4.2 05/27/2017 05:39 AM   LDLCALC 35 05/27/2017 05:39 AM    Wt Readings from Last 3 Encounters:  10/14/19 271 lb (122.9 kg)  05/27/19 278 lb (126.1 kg)  05/23/19 278 lb (126.1 kg)     Objective:    Vital Signs:  Ht 6' (1.829 m)    Wt 271 lb (122.9 kg)    BMI 36.75 kg/m   Pleasant male in no acute distress. A&O x 3.  Normal Mood & Affect Non-labored respirations  ASSESSMENT & PLAN:    Problem List Items Addressed This Visit    CAD S/P percutaneous coronary angioplasty - Primary (Chronic)    Distant history of DES stent.  Had a negative Myoview in July 2018 and yet again recently here in April 2020 for similar symptoms to what he is feeling right now.  He is on aspirin-no longer on Brilinta.       Relevant Medications   aspirin EC 81 MG tablet   nitroGLYCERIN (NITROSTAT) 0.4 MG SL tablet   sildenafil (VIAGRA) 25 MG tablet   Hypertensive heart disease without CHF (Chronic)    He tells me that his blood pressures have been relatively well controlled at his PCPs visit.  Continue the combination of ARB-calcium channel blocker.  Reassess potential benefit of restarting beta-blocker if blood pressure increases.      Relevant Medications   aspirin EC 81 MG tablet   nitroGLYCERIN (NITROSTAT) 0.4 MG SL tablet   sildenafil (VIAGRA) 25 MG tablet   Coronary artery disease involving native coronary artery of native heart without angina pectoris (Chronic)    I am reluctant to think that the symptoms he is having currently are related to angina.  He was having similar symptoms this spring and had a negative Myoview.  Prolonged constant chest pain not exacerbated with exertion  is not likely to be cardiac in nature.  Most likely musculoskeletal.  Plan: Continue aspirin and combination ARB-calcium channel blocker. No longer on a beta-blocker, but doing relatively well.  We chose to avoid restarting it to avoid fatigue.      Relevant Medications   aspirin EC 81 MG tablet   nitroGLYCERIN (NITROSTAT) 0.4 MG SL tablet   sildenafil (VIAGRA) 25 MG tablet   Diabetes mellitus, type II, insulin dependent (HCC) (Chronic)    Followed by PCP.  Uses freestyle libre detection monitor.      Relevant Medications   aspirin EC 81 MG tablet   Dyslipidemia, goal LDL below 70 (Chronic)    Labs have been well controlled.  Unfortunate I do not have the full panel from his PCP.  All I can see is that the triglycerides look good.  Previous labs is a 2017 and 2018 looks great on his Vytorin.  No change.      Relevant Medications   aspirin EC 81 MG tablet   nitroGLYCERIN (NITROSTAT) 0.4 MG SL tablet   sildenafil (VIAGRA) 25 MG tablet   Thoracic aortic aneurysm without rupture ([place]) - Noted on chest CT. 4.5 cm (Chronic)    Due to have follow-up CT scan that was ordered by Dr. Cyndia Bent. Continue blood pressure and lipid management.      Relevant Medications   aspirin EC 81 MG tablet  nitroGLYCERIN (NITROSTAT) 0.4 MG SL tablet   sildenafil (VIAGRA) 25 MG tablet   Chest pain with low risk for cardiac etiology    Atypical chest discomfort seems to be pretty persistent.  Recent stress test was negative for similar symptoms.  Continue to monitor, but at this point I would not progress to further testing.      Localized swelling of left lower extremity    Unilateral ankle edema in a patient who recently had surgery on that leg.  Need to exclude DVT. Plan: Check lower extremity venous Doppler.      Relevant Orders   VAS [place] LOWER EXTREMITY VENOUS (DVT)      COVID-19 Education: The signs and symptoms of COVID-19 were discussed with the patient and how to seek care for testing  (follow up with PCP or arrange E-visit).   The importance of social distancing was discussed today.  Time:   Today, I have spent 22 minutes with the patient with telehealth technology discussing the above problems.     Medication Adjustments/Labs and Tests Ordered: Current medicines are reviewed at length with the patient today.  Concerns regarding medicines are outlined above.   Patient Instructions  Medication Instructions:   We will refill your Viagra/sildenafil Do not use nitroglycerin tablets  At least 48 hours prior or after the use of Viagra/Sildeafil  *If you need a refill on your cardiac medications before your next appointment, please call your pharmacy*  Lab Work:  We will contact your primary care's office to get lab results.   Testing/Procedures: Will be schedule at [place] [address] Left lower leg vein Dopplers to look for clot clot  Your physician has requested that you have a lower extremity venous duplex-DVT. This test is an ultrasound of the veins in the legs. It looks at venous blood flow that carries blood from the heart to the legs. Allow one hour for a Lower Venous exam.  There are no restrictions or special instructions.    Follow-Up: At [facility], you and your health needs are our priority.  As part of our continuing mission to provide you with exceptional heart care, we have created designated Provider Care Teams.  These Care Teams include your primary Cardiologist (physician) and Advanced Practice Providers (APPs -  Physician Assistants and Nurse Practitioners) who all work together to provide you with the care you need, when you need it.  Your next appointment:   5 month(s)-May 2021  The format for your next appointment:   In Person  Provider:   Glenetta Hew, MD  Other Instructions Continue to exercise     Signed, Glenetta Hew, MD  10/14/2019 12:16 PM    [place]

## 2019-10-16 ENCOUNTER — Ambulatory Visit (HOSPITAL_COMMUNITY)
Admission: RE | Admit: 2019-10-16 | Discharge: 2019-10-16 | Disposition: A | Discharge status: Home / Self Care | Payer: Medicare Other | Source: Ambulatory Visit | Attending: Internal Medicine | Admitting: Internal Medicine

## 2019-10-16 ENCOUNTER — Other Ambulatory Visit: Payer: Self-pay

## 2019-10-16 ENCOUNTER — Telehealth: Payer: Self-pay | Admitting: *Deleted

## 2019-10-16 DIAGNOSIS — R2242 Localized swelling, mass and lump, left lower limb: Secondary | ICD-10-CM | POA: Diagnosis not present

## 2019-10-16 NOTE — Telephone Encounter (Signed)
-----   Message from Leonie Man, MD sent at 10/16/2019  1:55 PM EST ----- Doristine Devoid news, the vein Dopplers did not suggest any evidence of blood clot.  Glenetta Hew, MD

## 2019-10-16 NOTE — Telephone Encounter (Signed)
Left detailed message  of doppler results on patient's answer machine per DPR. Any question may call back

## 2019-11-19 DIAGNOSIS — Z96652 Presence of left artificial knee joint: Secondary | ICD-10-CM | POA: Diagnosis not present

## 2019-11-19 DIAGNOSIS — M25662 Stiffness of left knee, not elsewhere classified: Secondary | ICD-10-CM | POA: Diagnosis not present

## 2019-11-27 DIAGNOSIS — I119 Hypertensive heart disease without heart failure: Secondary | ICD-10-CM | POA: Diagnosis not present

## 2019-11-27 DIAGNOSIS — I129 Hypertensive chronic kidney disease with stage 1 through stage 4 chronic kidney disease, or unspecified chronic kidney disease: Secondary | ICD-10-CM | POA: Diagnosis not present

## 2019-11-27 DIAGNOSIS — I10 Essential (primary) hypertension: Secondary | ICD-10-CM | POA: Diagnosis not present

## 2019-11-27 DIAGNOSIS — Z7189 Other specified counseling: Secondary | ICD-10-CM | POA: Diagnosis not present

## 2019-11-27 DIAGNOSIS — E1140 Type 2 diabetes mellitus with diabetic neuropathy, unspecified: Secondary | ICD-10-CM | POA: Diagnosis not present

## 2019-11-27 DIAGNOSIS — E1165 Type 2 diabetes mellitus with hyperglycemia: Secondary | ICD-10-CM | POA: Diagnosis not present

## 2019-12-04 DIAGNOSIS — I2510 Atherosclerotic heart disease of native coronary artery without angina pectoris: Secondary | ICD-10-CM | POA: Diagnosis not present

## 2019-12-04 DIAGNOSIS — E118 Type 2 diabetes mellitus with unspecified complications: Secondary | ICD-10-CM | POA: Diagnosis not present

## 2019-12-04 DIAGNOSIS — E1165 Type 2 diabetes mellitus with hyperglycemia: Secondary | ICD-10-CM | POA: Diagnosis not present

## 2019-12-14 ENCOUNTER — Ambulatory Visit: Payer: Medicare Other | Attending: Internal Medicine

## 2019-12-14 DIAGNOSIS — Z23 Encounter for immunization: Secondary | ICD-10-CM

## 2019-12-14 NOTE — Progress Notes (Signed)
   Covid-19 Vaccination Clinic  Name:  Shawnmichael Anders    MRN: [id number] DOB: [birth year]  12/14/2019  Mr. Dayley was observed post Covid-19 immunization for 15 minutes without incidence. He was provided with Vaccine Information Sheet and instruction to access the V-Safe system.   Mr. Krass was instructed to call 911 with any severe reactions post vaccine: Marland Kitchen Difficulty breathing  . Swelling of your face and throat  . A fast heartbeat  . A bad rash all over your body  . Dizziness and weakness    Immunizations Administered    Name Date Dose VIS Date Route   Pfizer COVID-19 Vaccine 12/14/2019  8:34 AM 0.3 mL 10/17/2019 Intramuscular   Manufacturer: [place]   Lot: [id number]   [place]: [id number]

## 2020-01-07 ENCOUNTER — Ambulatory Visit: Payer: Medicare Other | Attending: Internal Medicine

## 2020-01-07 DIAGNOSIS — Z23 Encounter for immunization: Secondary | ICD-10-CM | POA: Insufficient documentation

## 2020-01-07 NOTE — Progress Notes (Signed)
   Covid-19 Vaccination Clinic  Name:  Plummer Laprade    MRN: [id number] DOB: 1945/12/10  01/07/2020  Jacob Berger was observed post Covid-19 immunization for 30 minutes based on pre-vaccination screening without incident. He was provided with Vaccine Information Sheet and instruction to access the V-Safe system.   Mr. Paller was instructed to call 911 with any severe reactions post vaccine: Marland Kitchen Difficulty breathing  . Swelling of face and throat  . A fast heartbeat  . A bad rash all over body  . Dizziness and weakness   Immunizations Administered    Name Date Dose VIS Date Route   Pfizer COVID-19 Vaccine 01/07/2020 12:54 PM 0.3 mL 10/17/2019 Intramuscular   Manufacturer: [place]   Lot: [id number]   [place]: [id number]

## 2020-01-13 DIAGNOSIS — H401134 Primary open-angle glaucoma, bilateral, indeterminate stage: Secondary | ICD-10-CM | POA: Diagnosis not present

## 2020-01-13 DIAGNOSIS — H04123 Dry eye syndrome of bilateral lacrimal glands: Secondary | ICD-10-CM | POA: Diagnosis not present

## 2020-01-13 DIAGNOSIS — Z961 Presence of intraocular lens: Secondary | ICD-10-CM | POA: Diagnosis not present

## 2020-01-15 DIAGNOSIS — H401133 Primary open-angle glaucoma, bilateral, severe stage: Secondary | ICD-10-CM | POA: Diagnosis not present

## 2020-02-18 DIAGNOSIS — Z794 Long term (current) use of insulin: Secondary | ICD-10-CM | POA: Diagnosis not present

## 2020-02-18 DIAGNOSIS — I2510 Atherosclerotic heart disease of native coronary artery without angina pectoris: Secondary | ICD-10-CM | POA: Diagnosis not present

## 2020-02-18 DIAGNOSIS — E1140 Type 2 diabetes mellitus with diabetic neuropathy, unspecified: Secondary | ICD-10-CM | POA: Diagnosis not present

## 2020-02-18 DIAGNOSIS — E1121 Type 2 diabetes mellitus with diabetic nephropathy: Secondary | ICD-10-CM | POA: Diagnosis not present

## 2020-02-18 DIAGNOSIS — E118 Type 2 diabetes mellitus with unspecified complications: Secondary | ICD-10-CM | POA: Diagnosis not present

## 2020-02-18 DIAGNOSIS — I10 Essential (primary) hypertension: Secondary | ICD-10-CM | POA: Diagnosis not present

## 2020-02-18 DIAGNOSIS — G4733 Obstructive sleep apnea (adult) (pediatric): Secondary | ICD-10-CM | POA: Diagnosis not present

## 2020-02-18 DIAGNOSIS — I129 Hypertensive chronic kidney disease with stage 1 through stage 4 chronic kidney disease, or unspecified chronic kidney disease: Secondary | ICD-10-CM | POA: Diagnosis not present

## 2020-02-18 DIAGNOSIS — Z7189 Other specified counseling: Secondary | ICD-10-CM | POA: Diagnosis not present

## 2020-02-18 DIAGNOSIS — N183 Chronic kidney disease, stage 3 unspecified: Secondary | ICD-10-CM | POA: Diagnosis not present

## 2020-02-18 DIAGNOSIS — I119 Hypertensive heart disease without heart failure: Secondary | ICD-10-CM | POA: Diagnosis not present

## 2020-02-18 DIAGNOSIS — E782 Mixed hyperlipidemia: Secondary | ICD-10-CM | POA: Diagnosis not present

## 2020-03-18 DIAGNOSIS — E1165 Type 2 diabetes mellitus with hyperglycemia: Secondary | ICD-10-CM | POA: Diagnosis not present

## 2020-03-18 DIAGNOSIS — I2510 Atherosclerotic heart disease of native coronary artery without angina pectoris: Secondary | ICD-10-CM | POA: Diagnosis not present

## 2020-03-18 DIAGNOSIS — E118 Type 2 diabetes mellitus with unspecified complications: Secondary | ICD-10-CM | POA: Diagnosis not present

## 2020-03-29 ENCOUNTER — Ambulatory Visit (INDEPENDENT_AMBULATORY_CARE_PROVIDER_SITE_OTHER): Payer: Medicare Other | Admitting: Cardiology

## 2020-03-29 ENCOUNTER — Encounter: Payer: Self-pay | Admitting: Cardiology

## 2020-03-29 ENCOUNTER — Other Ambulatory Visit: Payer: Self-pay

## 2020-03-29 VITALS — BP 140/60 | HR 75 | Ht 72.0 in | Wt 283.0 lb

## 2020-03-29 DIAGNOSIS — I2510 Atherosclerotic heart disease of native coronary artery without angina pectoris: Secondary | ICD-10-CM | POA: Diagnosis not present

## 2020-03-29 DIAGNOSIS — I119 Hypertensive heart disease without heart failure: Secondary | ICD-10-CM | POA: Diagnosis not present

## 2020-03-29 DIAGNOSIS — I712 Thoracic aortic aneurysm, without rupture, unspecified: Secondary | ICD-10-CM

## 2020-03-29 DIAGNOSIS — Z9861 Coronary angioplasty status: Secondary | ICD-10-CM | POA: Diagnosis not present

## 2020-03-29 DIAGNOSIS — R079 Chest pain, unspecified: Secondary | ICD-10-CM | POA: Diagnosis not present

## 2020-03-29 DIAGNOSIS — E785 Hyperlipidemia, unspecified: Secondary | ICD-10-CM | POA: Diagnosis not present

## 2020-03-29 DIAGNOSIS — N529 Male erectile dysfunction, unspecified: Secondary | ICD-10-CM | POA: Diagnosis not present

## 2020-03-29 MED ORDER — SILDENAFIL CITRATE 50 MG PO TABS: ORAL_TABLET | ORAL | 6 refills | Status: AC

## 2020-03-29 NOTE — Patient Instructions (Signed)
Medication Instructions:   refilled Sildenafil  - DO NOT TAKE WITH NITROGLYCERIN WITHIN 36 HOURS OF TAKING MEDICATION   *If you need a refill on your cardiac medications before your next appointment, please call your pharmacy*   Lab Work: Not needed   Testing/Procedures: Not needed   Follow-Up: At [facility], you and your health needs are our priority.  As part of our continuing mission to provide you with exceptional heart care, we have created designated Provider Care Teams.  These Care Teams include your primary Cardiologist (physician) and Advanced Practice Providers (APPs -  Physician Assistants and Nurse Practitioners) who all work together to provide you with the care you need, when you need it.  We recommend signing up for the patient portal called "MyChart".  Sign up information is provided on this After Visit Summary.  MyChart is used to connect with patients for Virtual Visits (Telemedicine).  Patients are able to view lab/test results, encounter notes, upcoming appointments, etc.  Non-urgent messages can be sent to your provider as well.   To learn more about what you can do with MyChart, go to [email or web address].    Your next appointment:   6 month(s)  The format for your next appointment:   In Person  Provider:   Glenetta Hew, MD

## 2020-03-29 NOTE — Progress Notes (Signed)
Primary Care Provider: Merrilee Seashore, MD Cardiologist: Glenetta Hew, MD Electrophysiologist: None  Clinic Note: Chief Complaint  Patient presents with  . Follow-up    Doing well.  Only notes issues with ED.  Marland Kitchen Coronary Artery Disease    No angina    HPI:    Jacob Berger is a 74 y.o. male with a PMH notable for CAD having PCI (RCA in 2011), hypertensive heart disease, TIA (4.8 cm followed by Dr. 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  Jacob Berger was last seen on October 14, 2019 via telemedicine following his knee surgery.  We had checked a Myoview stress test on him in April 2020 which showed low risk findings no ischemia or infarction with normal EF. --> He was doing fairly well started to get little more more mobile on his left leg.  Did note a little bit of swelling in the left ankle though.  He was noticing off and on prolonged episode of chest discomfort lasting most of the day, but not necessarily associated with exacerbated by activity.  No notable DOE, PND, orthopnea, or edema.  Recent Hospitalizations: None  Reviewed  CV studies:    The following studies were reviewed today: (if available, images/films reviewed: From Epic Chart or Care Everywhere) . October 16, 2019-left leg venous Dopplers: No evidence of DVT.   Interval History:   Jacob Krohreturns here today for routine cardiology follow-up stating that he is doing fairly well.  His blood pressures usually actually run a little lower than they are today.  Usually 100 2130 mmHg range for systolic pressures.  He tells me that he had not yet taken his blood pressure medication today. He says he is almost a year out from his left knee surgery and is doing better.  He is gradually building up his mobility.  Still has some swelling and discomfort there but it has definitely improved.  He walks about  a mile a day just but every day minus not raining.  He also does light weight calisthenic exercises.  He has not really had any chest tightness or pressure, but does occasionally notice some flip-flop palpitations in the chest.  These episodes come off and on fleeting, lasting less than a few seconds.  They usually occur at the end of the day when he is trying to relax or settle down.  He is now using his CPAP.  No PND, orthopnea or significant edema.  He does note a stable mild swelling in the left ankle.  CV Review of Symptoms (Summary) Cardiovascular ROS: no chest pain or dyspnea on exertion positive for - irregular heartbeat and Fleeting flip-flops sensation in the chest along with trivial left ankle swelling from surgery. negative for - chest pain, dyspnea on exertion, irregular heartbeat, orthopnea, paroxysmal nocturnal dyspnea, rapid heart rate, shortness of breath or Syncope/near syncope, TIA/amaurosis fugax, claudication  The patient does not have symptoms concerning for COVID-19 infection (fever, chills, cough, or new shortness of breath).  The patient is practicing social distancing & Masking.   COVID-19 vaccine injections December 14, 2019 January 07, 2020.  REVIEWED OF SYSTEMS   Review of Systems  Constitutional: Negative for malaise/fatigue and weight loss.  HENT: Negative for nosebleeds.   Respiratory: Negative for cough and shortness of breath.   Cardiovascular: Positive for palpitations and leg swelling (Unilateral left ankle swelling since left knee surgery).  Gastrointestinal: Negative for abdominal pain, blood in stool and melena.  Genitourinary: Negative for hematuria.       Noting erectile dysfunction.  Did not get much benefit from lower dose of sildenafil  Musculoskeletal: Positive for joint pain (Left knee still has some stiffness as does the left ankle.).  Neurological: Negative for dizziness, focal weakness and weakness.  Psychiatric/Behavioral: Negative for memory  loss. The patient has insomnia (Having a hard time falling asleep). The patient is not nervous/anxious.     I have reviewed and (if needed) personally updated the patient's problem list, medications, allergies, past medical and surgical history, social and family history.   PAST MEDICAL HISTORY   Past Medical History:  Diagnosis Date  . Arthritis    "knees" (05/10/2016)  . CAD S/P percutaneous coronary angioplasty 2011   a. 2011: s/p PCI to PDA with Endeavor 2.5 mm x 12 mm DES - [place] [facility] b. low-risk NST in 07/2015  . Cancer of kidney ([place])   . Chronic kidney disease   . Daily headache    "recently" (05/10/2016)  . Dyslipidemia, goal LDL below 70   . Glaucoma   . Gout   . History of kidney stones   . Hypertension, essential   . Obesity, Class II, BMI 35-39.9, with comorbidity    BMI 37  . OSA on CPAP   . PONV (postoperative nausea and vomiting)    after colonoscopy  . Prostate cancer ([place]) 2007  . Rash since 04-08-16   on neck healing, right arm improving  . Thoracic aortic aneurysm without rupture ([place]) - Noted on chest CT. 4.5 cm 02/15/2016   4.5 cm per dr Cyndia Bent note and [facility] ct 01-18-16  . Type II diabetes mellitus ([place])     PAST SURGICAL HISTORY   Past Surgical History:  Procedure Laterality Date  . ANTERIOR CERVICAL DECOMP/DISCECTOMY FUSION N/A 11/09/2017   Procedure: Cervical four-five, Cervical five-six Anterior discectomy with fusion and plate fixation;  Surgeon: Ditty, Kevan Ny, MD;  Location: [place];  Service: Neurosurgery;  Laterality: N/A;  . COLONOSCOPY W/ BIOPSIES AND POLYPECTOMY  "several"  . CORONARY ANGIOPLASTY WITH STENT PLACEMENT  2011   [place]: Endeavor 2.5 mm x 12 mm DES - distal PDA ([facility])  . EYE SURGERY    . LAPAROSCOPIC NEPHRECTOMY Left 04/13/2016   Procedure: LAPAROSCOPIC RADICAL LEFT NEPHRECTOMY;  Surgeon: Raynelle Bring, MD;  Location: [facility];  Service: Urology;  Laterality: Left;  . NM MYOVIEW  ([place] [facility])  6/'13, 3/'16   a. Treadmill Myoview: 10 minutes, 12 METS; no ischemia infarction, EF 65%; b. [facility]: LOW RISK. EF 57%. 10.4 METS low risk scan no ischemia. No wall motion abnormality.   Marland Kitchen NM MYOVIEW LTD  05/2017   EF 50%. Normal function. No ischemia or infarction.  Marland Kitchen PROSTATECTOMY    . REFRACTIVE SURGERY Left   . TOTAL KNEE ARTHROPLASTY Left 05/27/2019   Procedure: Left Knee Arthroplasty;  Surgeon: Melrose Nakayama, MD;  Location: [facility];  Service: Orthopedics;  Laterality: Left;    MEDICATIONS/ALLERGIES   Current Meds  Medication Sig  . allopurinol (ZYLOPRIM) 300 MG tablet Take 300 mg by mouth daily.   Marland Kitchen aspirin EC 81 MG tablet Take 81 mg by mouth daily.  . AZOR 10-40 MG per tablet TAKE 1 TABLET BY MOUTH DAILY  . bimatoprost (LUMIGAN) 0.01 % SOLN Place 1 drop into the right eye at bedtime.   . Carboxymethylcellulose Sodium (REFRESH LIQUIGEL) 1 %  GEL Place 1 drop into the left eye as needed (eye irritation).  . diphenhydrAMINE (BENADRYL) 25 MG tablet Take 1 tablet (25 mg total) by mouth every 6 (six) hours as needed for itching.  . diphenhydramine-acetaminophen (TYLENOL PM) 25-500 MG TABS tablet Take 1 tablet by mouth at bedtime as needed (sleep).  . Dulaglutide (TRULICITY) 1.5 LF/8.1OF SOPN Inject 1.5 mg into the skin every Thursday. In the afternoon 2pm  . fenofibrate (TRICOR) 145 MG tablet Take 145 mg by mouth daily at 2 PM.   . furosemide (LASIX) 80 MG tablet Take 20-40 mg by mouth daily as needed (for fluid).  Ernest Mallick FLEXTOUCH 100 UNIT/ML Pen Inject 38 Units into the skin at bedtime.   . Methylcellulose, Laxative, (CITRUCEL) 500 MG TABS Take 500 mg by mouth daily.   . Multiple Vitamins-Minerals (EMERGEN-C IMMUNE PO) Take 1 tablet by mouth daily.  . nitroGLYCERIN (NITROSTAT) 0.4 MG SL tablet Place 1 tablet (0.4 mg total) under the tongue every 5 (five) minutes as needed for chest pain.  Marland Kitchen NOVOLOG FLEXPEN 100 UNIT/ML FlexPen Inject 15 Units into the skin 3 (three) times  daily before meals.   Keene Breath COQ10/UBIQUINOL/MEGA PO Take 20 mLs by mouth 2 (two) times a week.  . timolol (TIMOPTIC) 0.5 % ophthalmic solution Place 1 drop into both eyes 2 (two) times daily. Morning and afternoon  . tiZANidine (ZANAFLEX) 4 MG tablet Take 1 tablet (4 mg total) by mouth every 6 (six) hours as needed.  Marland Kitchen VYTORIN 10-40 MG per tablet Take 1 tablet by mouth at bedtime.   . [DISCONTINUED] sildenafil (VIAGRA) 25 MG tablet TAKE 1  TO 2 TABLET  AS NEEDED ,FOR ERECTILE DYSFUNCTION    Allergies  Allergen Reactions  . Accupril [Quinapril Hcl] Swelling    MOUTH SWELLING    SOCIAL HISTORY/FAMILY HISTORY   Reviewed in Epic:  Pertinent findings: No new changes  OBJCTIVE -PE, EKG, labs   Wt Readings from Last 3 Encounters:  03/29/20 283 lb (128.4 kg)  10/14/19 271 lb (122.9 kg)  05/27/19 278 lb (126.1 kg)    Physical Exam: BP 140/60   Pulse 75   Ht 6' (1.829 m)   Wt 283 lb (128.4 kg)   BMI 38.38 kg/m  Physical Exam  Constitutional: He is oriented to person, place, and time. He appears well-developed and well-nourished. No distress.  Essentially morbidly obese gentleman.  Well-groomed.  HENT:  Head: Normocephalic and atraumatic.  Neck: No hepatojugular reflux and no JVD (Difficult to determine because of body habitus) present. Carotid bruit is not present.  Cardiovascular: Normal rate, regular rhythm and S1 normal.  Occasional extrasystoles are present. PMI is not displaced. Exam reveals no gallop and no decreased pulses.  Murmur heard.  Medium-pitched harsh crescendo-decrescendo early systolic murmur is present with a grade of 1/6 at the upper right sternal border radiating to the neck. Pulmonary/Chest: Effort normal and breath sounds normal. No respiratory distress. He has no wheezes. He has no rales.  Abdominal: Soft. Bowel sounds are normal. He exhibits no distension. There is no abdominal tenderness.  No HSM  Musculoskeletal:     Cervical back: Normal range of  motion and neck supple.  Neurological: He is alert and oriented to person, place, and time.  Skin: He is not diaphoretic.  Psychiatric: His behavior is normal. Judgment and thought content normal.  Vitals reviewed.    Adult ECG Report  Rate: 75 ;  Rhythm: normal sinus rhythm and Nonspecific ST and T wave changes.  Otherwise normal.;   Narrative Interpretation: Normal EKG   Recent Labs: Mar 18, 2020-TC 105, TG 139, LDL 35, HDL 30.  (LDL is actually from July 2018).  A1c from July 20 27.4.  Hgb 12.4. Lab Results  Component Value Date   CHOL 105 05/27/2017   HDL 25 (L) 05/27/2017   LDLCALC 35 05/27/2017   TRIG 224 (H) 05/27/2017   CHOLHDL 4.2 05/27/2017   Lab Results  Component Value Date   CREATININE 1.81 (H) 05/23/2019   BUN 29 (H) 05/23/2019   NA 140 05/23/2019   K 4.0 05/23/2019   CL 105 05/23/2019   CO2 22 05/23/2019   No results found for: TSH  ASSESSMENT/PLAN    Problem List Items Addressed This Visit    CAD S/P percutaneous coronary angioplasty (Chronic)    Distant history of stent-DES in 2011.  Has had a Myoview in 2018 in 2020 that were relatively normal.  Nonischemic. No longer on Brilinta.  On aspirin alone.  Okay to hold aspirin 5-7 days preop for surgeries or procedures.      Relevant Medications   sildenafil (VIAGRA) 50 MG tablet   Other Relevant Orders   EKG 12-Lead (Completed)   Hypertensive heart disease without CHF (Chronic)    Blood pressures again are little higher today here than usual.  On my recheck it was a little bit lower.  He is taking Azor and as needed Lasix.  Low threshold to consider adding low-dose diuretic as a standing medication. Defer reevaluation to PCP.  He will follow his blood pressures closely.      Relevant Medications   sildenafil (VIAGRA) 50 MG tablet   Coronary artery disease involving native coronary artery of native heart without angina pectoris - Primary (Chronic)    No real symptoms of angina since I met him.  He is  on amlodipine plus ARB along with statin and aspirin.  No beta-blocker because history of fatigue.  Back to being pretty active without any chest pain issues.  Is no longer on any antiplatelet agent besides aspirin.  Okay to hold aspirin for surgeries or procedures.      Relevant Medications   sildenafil (VIAGRA) 50 MG tablet   Other Relevant Orders   EKG 12-Lead (Completed)   Dyslipidemia, goal LDL below 70 (Chronic)    Labs been pretty well controlled.  Labs are followed by PCP.  I do not have the full panel, but seems to be relatively stable. Is on Vytorin.  Continue for now.  No change.      Relevant Medications   sildenafil (VIAGRA) 50 MG tablet   Thoracic aortic aneurysm without rupture ([place]) - Noted on chest CT. 4.5 cm (Chronic)    Followed up by Dr. Cyndia Bent. Recommendation is to continue blood pressure control and lipid management.-Her blood pressure is a little high today, will monitor closely.  I have imagine that he should have some type of imaging study this year to assess.      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  No nitroglycerin in 24 hours before and after. Prescription handed to the patient for mail order through [facility]      Chest pain with low risk for cardiac etiology    No further symptoms of chest discomfort.  They were totally atypical and evaluated with negative  Myoview in the past.          COVID-19 Education: The signs and symptoms of COVID-19 were discussed with the patient and how to seek care for testing (follow up with PCP or arrange E-visit).   The importance of social distancing and COVID-19 vaccination was discussed today.  I spent a total of 2mnutes with the patient. >  50% of the time was spent in direct patient consultation.  Additional time spent with chart review  / charting  (studies, outside notes, etc): 6 Total Time: 25 min   Current medicines are reviewed at length with the patient today.  (+/- concerns) none  Notice: This dictation was prepared with Dragon dictation along with smaller phrase technology. Any transcriptional errors that result from this process are unintentional and may not be corrected upon review.  Patient Instructions / Medication Changes & Studies & Tests Ordered   Patient Instructions  Medication Instructions:   refilled Sildenafil  - DO NOT TAKE WITH NITROGLYCERIN WITHIN 36 HOURS OF TAKING MEDICATION   *If you need a refill on your cardiac medications before your next appointment, please call your pharmacy*   Lab Work: Not needed   Testing/Procedures: Not needed   Follow-Up: At [facility] you and your health needs are our priority.  As part of our continuing mission to provide you with exceptional heart care, we have created designated Provider Care Teams.  These Care Teams include your primary Cardiologist (physician) and Advanced Practice Providers (APPs -  Physician Assistants and Nurse Practitioners) who all work together to provide you with the care you need, when you need it.  We recommend signing up for the patient portal called "MyChart".  Sign up information is provided on this After Visit Summary.  MyChart is used to connect with patients for Virtual Visits (Telemedicine).  Patients are able to view lab/test results, encounter notes, upcoming appointments, etc.  Non-urgent messages can be sent to your provider as well.   To learn more about what you can do with MyChart, go to [email or web address]    Your next appointment:   6 month(s)  The format for your next appointment:   In Person  Provider:   DGlenetta Hew MD       Studies Ordered:   Orders Placed This Encounter  Procedures  . EKG 12-Lead     DGlenetta Hew M.D., M.S. Interventional Cardiologist   Pager # [phone] #  [phone] [place] [id number]  [facility] you [facility]!

## 2020-04-03 ENCOUNTER — Encounter: Payer: Self-pay | Admitting: Cardiology

## 2020-04-03 NOTE — Assessment & Plan Note (Signed)
Followed up by Dr. Cyndia Bent. Recommendation is to continue blood pressure control and lipid management.-Her blood pressure is a little high today, will monitor closely.  I have imagine that he should have some type of imaging study this year to assess.

## 2020-04-03 NOTE — Assessment & Plan Note (Signed)
Will increase sildenafil dosing to 50 mg tablets 1 to 2 tablets as needed.  No nitroglycerin in 24 hours before and after. Prescription handed to the patient for mail order through [place]

## 2020-04-03 NOTE — Assessment & Plan Note (Signed)
Distant history of stent-DES in 2011.  Has had a Myoview in 2018 in 2020 that were relatively normal.  Nonischemic. No longer on Brilinta.  On aspirin alone.  Okay to hold aspirin 5-7 days preop for surgeries or procedures.

## 2020-04-03 NOTE — Assessment & Plan Note (Signed)
No further symptoms of chest discomfort.  They were totally atypical and evaluated with negative Myoview in the past.

## 2020-04-03 NOTE — Assessment & Plan Note (Signed)
No real symptoms of angina since I met him.  He is on amlodipine plus ARB along with statin and aspirin.  No beta-blocker because history of fatigue.  Back to being pretty active without any chest pain issues.  Is no longer on any antiplatelet agent besides aspirin.  Okay to hold aspirin for surgeries or procedures.

## 2020-04-03 NOTE — Assessment & Plan Note (Addendum)
Labs been pretty well controlled.  Labs are followed by PCP.  I do not have the full panel, but seems to be relatively stable. Is on Vytorin.  Continue for now.  No change.

## 2020-04-03 NOTE — Assessment & Plan Note (Signed)
Blood pressures again are little higher today here than usual.  On my recheck it was a little bit lower.  He is taking Azor and as needed Lasix.  Low threshold to consider adding low-dose diuretic as a standing medication. Defer reevaluation to PCP.  He will follow his blood pressures closely.

## 2020-05-24 DIAGNOSIS — I119 Hypertensive heart disease without heart failure: Secondary | ICD-10-CM | POA: Diagnosis not present

## 2020-05-24 DIAGNOSIS — I129 Hypertensive chronic kidney disease with stage 1 through stage 4 chronic kidney disease, or unspecified chronic kidney disease: Secondary | ICD-10-CM | POA: Diagnosis not present

## 2020-05-24 DIAGNOSIS — I2510 Atherosclerotic heart disease of native coronary artery without angina pectoris: Secondary | ICD-10-CM | POA: Diagnosis not present

## 2020-05-24 DIAGNOSIS — E782 Mixed hyperlipidemia: Secondary | ICD-10-CM | POA: Diagnosis not present

## 2020-05-24 DIAGNOSIS — I10 Essential (primary) hypertension: Secondary | ICD-10-CM | POA: Diagnosis not present

## 2020-05-24 DIAGNOSIS — G4733 Obstructive sleep apnea (adult) (pediatric): Secondary | ICD-10-CM | POA: Diagnosis not present

## 2020-05-24 DIAGNOSIS — E1140 Type 2 diabetes mellitus with diabetic neuropathy, unspecified: Secondary | ICD-10-CM | POA: Diagnosis not present

## 2020-05-24 DIAGNOSIS — Z6838 Body mass index (BMI) 38.0-38.9, adult: Secondary | ICD-10-CM | POA: Diagnosis not present

## 2020-05-24 DIAGNOSIS — Z794 Long term (current) use of insulin: Secondary | ICD-10-CM | POA: Diagnosis not present

## 2020-05-24 DIAGNOSIS — N183 Chronic kidney disease, stage 3 unspecified: Secondary | ICD-10-CM | POA: Diagnosis not present

## 2020-05-24 DIAGNOSIS — E1121 Type 2 diabetes mellitus with diabetic nephropathy: Secondary | ICD-10-CM | POA: Diagnosis not present

## 2020-05-28 DIAGNOSIS — M25572 Pain in left ankle and joints of left foot: Secondary | ICD-10-CM | POA: Diagnosis not present

## 2020-06-01 DIAGNOSIS — Z85528 Personal history of other malignant neoplasm of kidney: Secondary | ICD-10-CM | POA: Diagnosis not present

## 2020-06-02 DIAGNOSIS — H2511 Age-related nuclear cataract, right eye: Secondary | ICD-10-CM | POA: Diagnosis not present

## 2020-06-02 DIAGNOSIS — H401133 Primary open-angle glaucoma, bilateral, severe stage: Secondary | ICD-10-CM | POA: Diagnosis not present

## 2020-06-03 DIAGNOSIS — Z85528 Personal history of other malignant neoplasm of kidney: Secondary | ICD-10-CM | POA: Diagnosis not present

## 2020-06-03 DIAGNOSIS — R911 Solitary pulmonary nodule: Secondary | ICD-10-CM | POA: Diagnosis not present

## 2020-06-03 DIAGNOSIS — K5900 Constipation, unspecified: Secondary | ICD-10-CM | POA: Diagnosis not present

## 2020-06-04 DIAGNOSIS — E1165 Type 2 diabetes mellitus with hyperglycemia: Secondary | ICD-10-CM | POA: Diagnosis not present

## 2020-06-04 DIAGNOSIS — E782 Mixed hyperlipidemia: Secondary | ICD-10-CM | POA: Diagnosis not present

## 2020-06-04 DIAGNOSIS — I2510 Atherosclerotic heart disease of native coronary artery without angina pectoris: Secondary | ICD-10-CM | POA: Diagnosis not present

## 2020-06-04 DIAGNOSIS — I119 Hypertensive heart disease without heart failure: Secondary | ICD-10-CM | POA: Diagnosis not present

## 2020-06-04 DIAGNOSIS — I712 Thoracic aortic aneurysm, without rupture: Secondary | ICD-10-CM | POA: Diagnosis not present

## 2020-06-04 DIAGNOSIS — E1121 Type 2 diabetes mellitus with diabetic nephropathy: Secondary | ICD-10-CM | POA: Diagnosis not present

## 2020-06-04 DIAGNOSIS — C642 Malignant neoplasm of left kidney, except renal pelvis: Secondary | ICD-10-CM | POA: Diagnosis not present

## 2020-06-04 DIAGNOSIS — E1140 Type 2 diabetes mellitus with diabetic neuropathy, unspecified: Secondary | ICD-10-CM | POA: Diagnosis not present

## 2020-06-04 DIAGNOSIS — Z794 Long term (current) use of insulin: Secondary | ICD-10-CM | POA: Diagnosis not present

## 2020-06-04 DIAGNOSIS — Z0000 Encounter for general adult medical examination without abnormal findings: Secondary | ICD-10-CM | POA: Diagnosis not present

## 2020-06-04 DIAGNOSIS — I700 Atherosclerosis of aorta: Secondary | ICD-10-CM | POA: Diagnosis not present

## 2020-06-07 DIAGNOSIS — S93402D Sprain of unspecified ligament of left ankle, subsequent encounter: Secondary | ICD-10-CM | POA: Diagnosis not present

## 2020-06-07 DIAGNOSIS — M25672 Stiffness of left ankle, not elsewhere classified: Secondary | ICD-10-CM | POA: Diagnosis not present

## 2020-06-08 DIAGNOSIS — Z8546 Personal history of malignant neoplasm of prostate: Secondary | ICD-10-CM | POA: Diagnosis not present

## 2020-06-08 DIAGNOSIS — Z85528 Personal history of other malignant neoplasm of kidney: Secondary | ICD-10-CM | POA: Diagnosis not present

## 2020-06-17 ENCOUNTER — Telehealth: Payer: Self-pay | Admitting: Cardiology

## 2020-06-17 NOTE — Telephone Encounter (Signed)
Spoke to pt who report he's been experiencing on and off palpations for a week. He also report occasional SOB and CP but voice it's nothing severe. Pt does not have a BP/HR machine and unable to provide readings. Pt state he has experienced previously and was advised to contact office if symptoms reoccur.   Pt also voiced he is scheduled for cataract surgery next Tuesday and questioning if he need to be seen in office prior to surgery.   Will route to MD for recommendations.

## 2020-06-17 NOTE — Telephone Encounter (Signed)
Patient c/o Palpitations:  High priority if patient c/o lightheadedness, shortness of breath, or chest pain  1) How long have you had palpitations/irregular HR/ Afib? Are you having the symptoms now?  Palpitations- some  light palpitationsh right now-   2) Are you currently experiencing lightheadedness, SOB or CP? Little of bothh, but not at this time  3) Do you have a history of afib (atrial fibrillation) or irregular heart rhythm? no  4) Have you checked your BP or HR? (document readings if available):  Have not checked it  5) Are you experiencing any other symptoms? no

## 2020-06-18 NOTE — Telephone Encounter (Signed)
I would say if the symptoms are okay by the time Monday morning comes on, he is okay to have his procedure on Tuesday.  Not sure if we can actually get him in on Monday unless it is a work in patient -> I am fully booked.    Glenetta Hew, MD

## 2020-06-21 NOTE — Telephone Encounter (Signed)
Pt updated an verbalized understanding.

## 2020-06-21 NOTE — Telephone Encounter (Signed)
Left message to call back  

## 2020-06-21 NOTE — Telephone Encounter (Signed)
Patient returning Jacob Berger's call.

## 2020-06-22 DIAGNOSIS — E1122 Type 2 diabetes mellitus with diabetic chronic kidney disease: Secondary | ICD-10-CM | POA: Diagnosis not present

## 2020-06-22 DIAGNOSIS — N183 Chronic kidney disease, stage 3 unspecified: Secondary | ICD-10-CM | POA: Diagnosis not present

## 2020-06-22 DIAGNOSIS — Z79899 Other long term (current) drug therapy: Secondary | ICD-10-CM | POA: Diagnosis not present

## 2020-06-22 DIAGNOSIS — M1990 Unspecified osteoarthritis, unspecified site: Secondary | ICD-10-CM | POA: Diagnosis not present

## 2020-06-22 DIAGNOSIS — H401113 Primary open-angle glaucoma, right eye, severe stage: Secondary | ICD-10-CM | POA: Diagnosis not present

## 2020-06-22 DIAGNOSIS — Z794 Long term (current) use of insulin: Secondary | ICD-10-CM | POA: Diagnosis not present

## 2020-06-22 DIAGNOSIS — E1136 Type 2 diabetes mellitus with diabetic cataract: Secondary | ICD-10-CM | POA: Diagnosis not present

## 2020-06-22 DIAGNOSIS — E7800 Pure hypercholesterolemia, unspecified: Secondary | ICD-10-CM | POA: Diagnosis not present

## 2020-06-22 DIAGNOSIS — I1310 Hypertensive heart and chronic kidney disease without heart failure, with stage 1 through stage 4 chronic kidney disease, or unspecified chronic kidney disease: Secondary | ICD-10-CM | POA: Diagnosis not present

## 2020-06-22 DIAGNOSIS — M109 Gout, unspecified: Secondary | ICD-10-CM | POA: Diagnosis not present

## 2020-06-22 DIAGNOSIS — I2510 Atherosclerotic heart disease of native coronary artery without angina pectoris: Secondary | ICD-10-CM | POA: Diagnosis not present

## 2020-06-22 DIAGNOSIS — H2511 Age-related nuclear cataract, right eye: Secondary | ICD-10-CM | POA: Diagnosis not present

## 2020-06-22 DIAGNOSIS — E1139 Type 2 diabetes mellitus with other diabetic ophthalmic complication: Secondary | ICD-10-CM | POA: Diagnosis not present

## 2020-06-22 DIAGNOSIS — H2510 Age-related nuclear cataract, unspecified eye: Secondary | ICD-10-CM | POA: Diagnosis not present

## 2020-06-22 DIAGNOSIS — E785 Hyperlipidemia, unspecified: Secondary | ICD-10-CM | POA: Diagnosis not present

## 2020-06-22 DIAGNOSIS — Z85528 Personal history of other malignant neoplasm of kidney: Secondary | ICD-10-CM | POA: Diagnosis not present

## 2020-07-06 DIAGNOSIS — M25672 Stiffness of left ankle, not elsewhere classified: Secondary | ICD-10-CM | POA: Diagnosis not present

## 2020-07-06 DIAGNOSIS — S93402D Sprain of unspecified ligament of left ankle, subsequent encounter: Secondary | ICD-10-CM | POA: Diagnosis not present

## 2020-07-26 DIAGNOSIS — M25672 Stiffness of left ankle, not elsewhere classified: Secondary | ICD-10-CM | POA: Diagnosis not present

## 2020-07-26 DIAGNOSIS — S93402D Sprain of unspecified ligament of left ankle, subsequent encounter: Secondary | ICD-10-CM | POA: Diagnosis not present

## 2020-08-02 DIAGNOSIS — M25572 Pain in left ankle and joints of left foot: Secondary | ICD-10-CM | POA: Diagnosis not present

## 2020-08-07 DIAGNOSIS — Z23 Encounter for immunization: Secondary | ICD-10-CM | POA: Diagnosis not present

## 2020-08-27 DIAGNOSIS — Z01812 Encounter for preprocedural laboratory examination: Secondary | ICD-10-CM | POA: Diagnosis not present

## 2020-08-27 DIAGNOSIS — Z20822 Contact with and (suspected) exposure to covid-19: Secondary | ICD-10-CM | POA: Diagnosis not present

## 2020-08-30 DIAGNOSIS — E785 Hyperlipidemia, unspecified: Secondary | ICD-10-CM | POA: Diagnosis not present

## 2020-08-30 DIAGNOSIS — M1990 Unspecified osteoarthritis, unspecified site: Secondary | ICD-10-CM | POA: Diagnosis not present

## 2020-08-30 DIAGNOSIS — H401113 Primary open-angle glaucoma, right eye, severe stage: Secondary | ICD-10-CM | POA: Diagnosis not present

## 2020-08-30 DIAGNOSIS — N183 Chronic kidney disease, stage 3 unspecified: Secondary | ICD-10-CM | POA: Diagnosis not present

## 2020-08-30 DIAGNOSIS — Z87891 Personal history of nicotine dependence: Secondary | ICD-10-CM | POA: Diagnosis not present

## 2020-08-30 DIAGNOSIS — M109 Gout, unspecified: Secondary | ICD-10-CM | POA: Diagnosis not present

## 2020-08-30 DIAGNOSIS — Z79899 Other long term (current) drug therapy: Secondary | ICD-10-CM | POA: Diagnosis not present

## 2020-08-30 DIAGNOSIS — Z794 Long term (current) use of insulin: Secondary | ICD-10-CM | POA: Diagnosis not present

## 2020-08-30 DIAGNOSIS — I2510 Atherosclerotic heart disease of native coronary artery without angina pectoris: Secondary | ICD-10-CM | POA: Diagnosis not present

## 2020-08-30 DIAGNOSIS — E1122 Type 2 diabetes mellitus with diabetic chronic kidney disease: Secondary | ICD-10-CM | POA: Diagnosis not present

## 2020-08-30 DIAGNOSIS — H401133 Primary open-angle glaucoma, bilateral, severe stage: Secondary | ICD-10-CM | POA: Diagnosis not present

## 2020-08-30 DIAGNOSIS — E7800 Pure hypercholesterolemia, unspecified: Secondary | ICD-10-CM | POA: Diagnosis not present

## 2020-08-30 DIAGNOSIS — I1310 Hypertensive heart and chronic kidney disease without heart failure, with stage 1 through stage 4 chronic kidney disease, or unspecified chronic kidney disease: Secondary | ICD-10-CM | POA: Diagnosis not present

## 2020-09-22 DIAGNOSIS — I2510 Atherosclerotic heart disease of native coronary artery without angina pectoris: Secondary | ICD-10-CM | POA: Diagnosis not present

## 2020-09-22 DIAGNOSIS — Z794 Long term (current) use of insulin: Secondary | ICD-10-CM | POA: Diagnosis not present

## 2020-09-22 DIAGNOSIS — G4733 Obstructive sleep apnea (adult) (pediatric): Secondary | ICD-10-CM | POA: Diagnosis not present

## 2020-09-22 DIAGNOSIS — I10 Essential (primary) hypertension: Secondary | ICD-10-CM | POA: Diagnosis not present

## 2020-09-22 DIAGNOSIS — E782 Mixed hyperlipidemia: Secondary | ICD-10-CM | POA: Diagnosis not present

## 2020-09-22 DIAGNOSIS — I129 Hypertensive chronic kidney disease with stage 1 through stage 4 chronic kidney disease, or unspecified chronic kidney disease: Secondary | ICD-10-CM | POA: Diagnosis not present

## 2020-09-22 DIAGNOSIS — E1140 Type 2 diabetes mellitus with diabetic neuropathy, unspecified: Secondary | ICD-10-CM | POA: Diagnosis not present

## 2020-09-22 DIAGNOSIS — Z23 Encounter for immunization: Secondary | ICD-10-CM | POA: Diagnosis not present

## 2020-09-22 DIAGNOSIS — E1121 Type 2 diabetes mellitus with diabetic nephropathy: Secondary | ICD-10-CM | POA: Diagnosis not present

## 2020-09-22 DIAGNOSIS — E118 Type 2 diabetes mellitus with unspecified complications: Secondary | ICD-10-CM | POA: Diagnosis not present

## 2020-09-22 DIAGNOSIS — I119 Hypertensive heart disease without heart failure: Secondary | ICD-10-CM | POA: Diagnosis not present

## 2020-09-22 DIAGNOSIS — N183 Chronic kidney disease, stage 3 unspecified: Secondary | ICD-10-CM | POA: Diagnosis not present

## 2020-12-01 DIAGNOSIS — H401133 Primary open-angle glaucoma, bilateral, severe stage: Secondary | ICD-10-CM | POA: Diagnosis not present

## 2020-12-08 DIAGNOSIS — Z09 Encounter for follow-up examination after completed treatment for conditions other than malignant neoplasm: Secondary | ICD-10-CM | POA: Diagnosis not present

## 2020-12-08 DIAGNOSIS — H401133 Primary open-angle glaucoma, bilateral, severe stage: Secondary | ICD-10-CM | POA: Diagnosis not present

## 2020-12-22 DIAGNOSIS — N183 Chronic kidney disease, stage 3 unspecified: Secondary | ICD-10-CM | POA: Diagnosis not present

## 2020-12-22 DIAGNOSIS — E1140 Type 2 diabetes mellitus with diabetic neuropathy, unspecified: Secondary | ICD-10-CM | POA: Diagnosis not present

## 2020-12-22 DIAGNOSIS — Z794 Long term (current) use of insulin: Secondary | ICD-10-CM | POA: Diagnosis not present

## 2020-12-22 DIAGNOSIS — E782 Mixed hyperlipidemia: Secondary | ICD-10-CM | POA: Diagnosis not present

## 2020-12-22 DIAGNOSIS — I119 Hypertensive heart disease without heart failure: Secondary | ICD-10-CM | POA: Diagnosis not present

## 2020-12-22 DIAGNOSIS — I129 Hypertensive chronic kidney disease with stage 1 through stage 4 chronic kidney disease, or unspecified chronic kidney disease: Secondary | ICD-10-CM | POA: Diagnosis not present

## 2020-12-22 DIAGNOSIS — G4733 Obstructive sleep apnea (adult) (pediatric): Secondary | ICD-10-CM | POA: Diagnosis not present

## 2020-12-22 DIAGNOSIS — H401133 Primary open-angle glaucoma, bilateral, severe stage: Secondary | ICD-10-CM | POA: Diagnosis not present

## 2020-12-22 DIAGNOSIS — Z6838 Body mass index (BMI) 38.0-38.9, adult: Secondary | ICD-10-CM | POA: Diagnosis not present

## 2020-12-22 DIAGNOSIS — I10 Essential (primary) hypertension: Secondary | ICD-10-CM | POA: Diagnosis not present

## 2020-12-22 DIAGNOSIS — E1121 Type 2 diabetes mellitus with diabetic nephropathy: Secondary | ICD-10-CM | POA: Diagnosis not present

## 2020-12-22 DIAGNOSIS — I2510 Atherosclerotic heart disease of native coronary artery without angina pectoris: Secondary | ICD-10-CM | POA: Diagnosis not present

## 2021-01-11 DIAGNOSIS — Z794 Long term (current) use of insulin: Secondary | ICD-10-CM | POA: Diagnosis not present

## 2021-01-11 DIAGNOSIS — H401133 Primary open-angle glaucoma, bilateral, severe stage: Secondary | ICD-10-CM | POA: Diagnosis not present

## 2021-01-11 DIAGNOSIS — E119 Type 2 diabetes mellitus without complications: Secondary | ICD-10-CM | POA: Diagnosis not present

## 2021-01-12 DIAGNOSIS — I119 Hypertensive heart disease without heart failure: Secondary | ICD-10-CM | POA: Diagnosis not present

## 2021-01-12 DIAGNOSIS — E1140 Type 2 diabetes mellitus with diabetic neuropathy, unspecified: Secondary | ICD-10-CM | POA: Diagnosis not present

## 2021-01-12 DIAGNOSIS — I2510 Atherosclerotic heart disease of native coronary artery without angina pectoris: Secondary | ICD-10-CM | POA: Diagnosis not present

## 2021-01-12 DIAGNOSIS — E782 Mixed hyperlipidemia: Secondary | ICD-10-CM | POA: Diagnosis not present

## 2021-01-13 ENCOUNTER — Other Ambulatory Visit: Payer: Self-pay | Admitting: Cardiology

## 2021-01-17 DIAGNOSIS — N1831 Chronic kidney disease, stage 3a: Secondary | ICD-10-CM | POA: Diagnosis not present

## 2021-01-17 DIAGNOSIS — M159 Polyosteoarthritis, unspecified: Secondary | ICD-10-CM | POA: Diagnosis not present

## 2021-01-17 DIAGNOSIS — E1165 Type 2 diabetes mellitus with hyperglycemia: Secondary | ICD-10-CM | POA: Diagnosis not present

## 2021-01-17 DIAGNOSIS — I119 Hypertensive heart disease without heart failure: Secondary | ICD-10-CM | POA: Diagnosis not present

## 2021-01-17 DIAGNOSIS — Z794 Long term (current) use of insulin: Secondary | ICD-10-CM | POA: Diagnosis not present

## 2021-01-25 DIAGNOSIS — L258 Unspecified contact dermatitis due to other agents: Secondary | ICD-10-CM | POA: Diagnosis not present

## 2021-02-01 DIAGNOSIS — H02842 Edema of right lower eyelid: Secondary | ICD-10-CM | POA: Diagnosis not present

## 2021-02-01 DIAGNOSIS — H02841 Edema of right upper eyelid: Secondary | ICD-10-CM | POA: Diagnosis not present

## 2021-02-01 DIAGNOSIS — H02843 Edema of right eye, unspecified eyelid: Secondary | ICD-10-CM | POA: Diagnosis not present

## 2021-02-01 DIAGNOSIS — H02844 Edema of left upper eyelid: Secondary | ICD-10-CM | POA: Diagnosis not present

## 2021-02-01 DIAGNOSIS — H401133 Primary open-angle glaucoma, bilateral, severe stage: Secondary | ICD-10-CM | POA: Diagnosis not present

## 2021-02-01 DIAGNOSIS — Z79899 Other long term (current) drug therapy: Secondary | ICD-10-CM | POA: Diagnosis not present

## 2021-02-01 DIAGNOSIS — H02845 Edema of left lower eyelid: Secondary | ICD-10-CM | POA: Diagnosis not present

## 2021-02-01 DIAGNOSIS — H02846 Edema of left eye, unspecified eyelid: Secondary | ICD-10-CM | POA: Diagnosis not present

## 2021-02-12 ENCOUNTER — Encounter: Payer: Self-pay | Admitting: Cardiology

## 2021-03-03 DIAGNOSIS — G514 Facial myokymia: Secondary | ICD-10-CM | POA: Diagnosis not present

## 2021-03-03 DIAGNOSIS — H02843 Edema of right eye, unspecified eyelid: Secondary | ICD-10-CM | POA: Diagnosis not present

## 2021-03-03 DIAGNOSIS — H02842 Edema of right lower eyelid: Secondary | ICD-10-CM | POA: Diagnosis not present

## 2021-03-03 DIAGNOSIS — H02846 Edema of left eye, unspecified eyelid: Secondary | ICD-10-CM | POA: Diagnosis not present

## 2021-03-03 DIAGNOSIS — H401133 Primary open-angle glaucoma, bilateral, severe stage: Secondary | ICD-10-CM | POA: Diagnosis not present

## 2021-03-03 DIAGNOSIS — H02841 Edema of right upper eyelid: Secondary | ICD-10-CM | POA: Diagnosis not present

## 2021-03-03 DIAGNOSIS — H02844 Edema of left upper eyelid: Secondary | ICD-10-CM | POA: Diagnosis not present

## 2021-03-03 DIAGNOSIS — H02845 Edema of left lower eyelid: Secondary | ICD-10-CM | POA: Diagnosis not present

## 2021-03-03 DIAGNOSIS — Z79899 Other long term (current) drug therapy: Secondary | ICD-10-CM | POA: Diagnosis not present

## 2021-04-01 DIAGNOSIS — Z23 Encounter for immunization: Secondary | ICD-10-CM | POA: Diagnosis not present

## 2021-05-05 DIAGNOSIS — H02846 Edema of left eye, unspecified eyelid: Secondary | ICD-10-CM | POA: Diagnosis not present

## 2021-05-05 DIAGNOSIS — H02843 Edema of right eye, unspecified eyelid: Secondary | ICD-10-CM | POA: Diagnosis not present

## 2021-05-05 DIAGNOSIS — Z87898 Personal history of other specified conditions: Secondary | ICD-10-CM | POA: Diagnosis not present

## 2021-05-05 DIAGNOSIS — H401133 Primary open-angle glaucoma, bilateral, severe stage: Secondary | ICD-10-CM | POA: Diagnosis not present

## 2021-05-05 DIAGNOSIS — Z79899 Other long term (current) drug therapy: Secondary | ICD-10-CM | POA: Diagnosis not present

## 2021-05-23 DIAGNOSIS — Z8546 Personal history of malignant neoplasm of prostate: Secondary | ICD-10-CM | POA: Diagnosis not present

## 2021-05-25 DIAGNOSIS — Z8546 Personal history of malignant neoplasm of prostate: Secondary | ICD-10-CM | POA: Diagnosis not present

## 2021-05-25 DIAGNOSIS — I2510 Atherosclerotic heart disease of native coronary artery without angina pectoris: Secondary | ICD-10-CM | POA: Diagnosis not present

## 2021-05-25 DIAGNOSIS — M160 Bilateral primary osteoarthritis of hip: Secondary | ICD-10-CM | POA: Diagnosis not present

## 2021-05-25 DIAGNOSIS — J984 Other disorders of lung: Secondary | ICD-10-CM | POA: Diagnosis not present

## 2021-05-25 DIAGNOSIS — M47814 Spondylosis without myelopathy or radiculopathy, thoracic region: Secondary | ICD-10-CM | POA: Diagnosis not present

## 2021-05-25 DIAGNOSIS — Z85528 Personal history of other malignant neoplasm of kidney: Secondary | ICD-10-CM | POA: Diagnosis not present

## 2021-05-30 DIAGNOSIS — Z8546 Personal history of malignant neoplasm of prostate: Secondary | ICD-10-CM | POA: Diagnosis not present

## 2021-05-30 DIAGNOSIS — N5201 Erectile dysfunction due to arterial insufficiency: Secondary | ICD-10-CM | POA: Diagnosis not present

## 2021-05-30 DIAGNOSIS — Z85528 Personal history of other malignant neoplasm of kidney: Secondary | ICD-10-CM | POA: Diagnosis not present

## 2021-06-06 DIAGNOSIS — I2510 Atherosclerotic heart disease of native coronary artery without angina pectoris: Secondary | ICD-10-CM | POA: Diagnosis not present

## 2021-06-06 DIAGNOSIS — I129 Hypertensive chronic kidney disease with stage 1 through stage 4 chronic kidney disease, or unspecified chronic kidney disease: Secondary | ICD-10-CM | POA: Diagnosis not present

## 2021-06-06 DIAGNOSIS — E782 Mixed hyperlipidemia: Secondary | ICD-10-CM | POA: Diagnosis not present

## 2021-06-06 DIAGNOSIS — Z0000 Encounter for general adult medical examination without abnormal findings: Secondary | ICD-10-CM | POA: Diagnosis not present

## 2021-06-06 DIAGNOSIS — R5383 Other fatigue: Secondary | ICD-10-CM | POA: Diagnosis not present

## 2021-06-06 DIAGNOSIS — E118 Type 2 diabetes mellitus with unspecified complications: Secondary | ICD-10-CM | POA: Diagnosis not present

## 2021-06-13 DIAGNOSIS — E1165 Type 2 diabetes mellitus with hyperglycemia: Secondary | ICD-10-CM | POA: Diagnosis not present

## 2021-06-13 DIAGNOSIS — E782 Mixed hyperlipidemia: Secondary | ICD-10-CM | POA: Diagnosis not present

## 2021-06-13 DIAGNOSIS — N1831 Chronic kidney disease, stage 3a: Secondary | ICD-10-CM | POA: Diagnosis not present

## 2021-06-13 DIAGNOSIS — I712 Thoracic aortic aneurysm, without rupture: Secondary | ICD-10-CM | POA: Diagnosis not present

## 2021-06-13 DIAGNOSIS — M159 Polyosteoarthritis, unspecified: Secondary | ICD-10-CM | POA: Diagnosis not present

## 2021-06-13 DIAGNOSIS — Z794 Long term (current) use of insulin: Secondary | ICD-10-CM | POA: Diagnosis not present

## 2021-06-13 DIAGNOSIS — I119 Hypertensive heart disease without heart failure: Secondary | ICD-10-CM | POA: Diagnosis not present

## 2021-06-13 DIAGNOSIS — I700 Atherosclerosis of aorta: Secondary | ICD-10-CM | POA: Diagnosis not present

## 2021-06-21 DIAGNOSIS — I2510 Atherosclerotic heart disease of native coronary artery without angina pectoris: Secondary | ICD-10-CM | POA: Diagnosis not present

## 2021-06-21 DIAGNOSIS — I129 Hypertensive chronic kidney disease with stage 1 through stage 4 chronic kidney disease, or unspecified chronic kidney disease: Secondary | ICD-10-CM | POA: Diagnosis not present

## 2021-06-21 DIAGNOSIS — I10 Essential (primary) hypertension: Secondary | ICD-10-CM | POA: Diagnosis not present

## 2021-06-21 DIAGNOSIS — G4733 Obstructive sleep apnea (adult) (pediatric): Secondary | ICD-10-CM | POA: Diagnosis not present

## 2021-06-21 DIAGNOSIS — E1140 Type 2 diabetes mellitus with diabetic neuropathy, unspecified: Secondary | ICD-10-CM | POA: Diagnosis not present

## 2021-06-21 DIAGNOSIS — E1121 Type 2 diabetes mellitus with diabetic nephropathy: Secondary | ICD-10-CM | POA: Diagnosis not present

## 2021-06-21 DIAGNOSIS — Z6838 Body mass index (BMI) 38.0-38.9, adult: Secondary | ICD-10-CM | POA: Diagnosis not present

## 2021-06-21 DIAGNOSIS — Z794 Long term (current) use of insulin: Secondary | ICD-10-CM | POA: Diagnosis not present

## 2021-06-21 DIAGNOSIS — N183 Chronic kidney disease, stage 3 unspecified: Secondary | ICD-10-CM | POA: Diagnosis not present

## 2021-06-21 DIAGNOSIS — I119 Hypertensive heart disease without heart failure: Secondary | ICD-10-CM | POA: Diagnosis not present

## 2021-06-21 DIAGNOSIS — E782 Mixed hyperlipidemia: Secondary | ICD-10-CM | POA: Diagnosis not present

## 2021-08-04 DIAGNOSIS — Z888 Allergy status to other drugs, medicaments and biological substances status: Secondary | ICD-10-CM | POA: Diagnosis not present

## 2021-08-04 DIAGNOSIS — R079 Chest pain, unspecified: Secondary | ICD-10-CM | POA: Diagnosis not present

## 2021-08-04 DIAGNOSIS — E119 Type 2 diabetes mellitus without complications: Secondary | ICD-10-CM | POA: Diagnosis not present

## 2021-08-04 DIAGNOSIS — Z743 Need for continuous supervision: Secondary | ICD-10-CM | POA: Diagnosis not present

## 2021-08-04 DIAGNOSIS — S0081XA Abrasion of other part of head, initial encounter: Secondary | ICD-10-CM | POA: Diagnosis not present

## 2021-08-04 DIAGNOSIS — Z7901 Long term (current) use of anticoagulants: Secondary | ICD-10-CM | POA: Diagnosis not present

## 2021-08-04 DIAGNOSIS — M19012 Primary osteoarthritis, left shoulder: Secondary | ICD-10-CM | POA: Diagnosis not present

## 2021-08-04 DIAGNOSIS — R0789 Other chest pain: Secondary | ICD-10-CM | POA: Diagnosis not present

## 2021-08-11 DIAGNOSIS — H401133 Primary open-angle glaucoma, bilateral, severe stage: Secondary | ICD-10-CM | POA: Diagnosis not present

## 2021-08-11 DIAGNOSIS — Z79899 Other long term (current) drug therapy: Secondary | ICD-10-CM | POA: Diagnosis not present

## 2021-08-11 DIAGNOSIS — H04123 Dry eye syndrome of bilateral lacrimal glands: Secondary | ICD-10-CM | POA: Diagnosis not present

## 2021-08-11 DIAGNOSIS — Z87898 Personal history of other specified conditions: Secondary | ICD-10-CM | POA: Diagnosis not present

## 2021-09-19 DIAGNOSIS — I10 Essential (primary) hypertension: Secondary | ICD-10-CM | POA: Diagnosis not present

## 2021-09-19 DIAGNOSIS — Z794 Long term (current) use of insulin: Secondary | ICD-10-CM | POA: Diagnosis not present

## 2021-09-19 DIAGNOSIS — E1165 Type 2 diabetes mellitus with hyperglycemia: Secondary | ICD-10-CM | POA: Diagnosis not present

## 2021-09-19 DIAGNOSIS — E782 Mixed hyperlipidemia: Secondary | ICD-10-CM | POA: Diagnosis not present

## 2021-09-19 DIAGNOSIS — I2510 Atherosclerotic heart disease of native coronary artery without angina pectoris: Secondary | ICD-10-CM | POA: Diagnosis not present

## 2021-09-19 DIAGNOSIS — I119 Hypertensive heart disease without heart failure: Secondary | ICD-10-CM | POA: Diagnosis not present

## 2021-09-19 DIAGNOSIS — Z6838 Body mass index (BMI) 38.0-38.9, adult: Secondary | ICD-10-CM | POA: Diagnosis not present

## 2021-09-19 DIAGNOSIS — E1121 Type 2 diabetes mellitus with diabetic nephropathy: Secondary | ICD-10-CM | POA: Diagnosis not present

## 2021-09-19 DIAGNOSIS — N1831 Chronic kidney disease, stage 3a: Secondary | ICD-10-CM | POA: Diagnosis not present

## 2021-09-19 DIAGNOSIS — E1140 Type 2 diabetes mellitus with diabetic neuropathy, unspecified: Secondary | ICD-10-CM | POA: Diagnosis not present

## 2021-09-19 DIAGNOSIS — I129 Hypertensive chronic kidney disease with stage 1 through stage 4 chronic kidney disease, or unspecified chronic kidney disease: Secondary | ICD-10-CM | POA: Diagnosis not present

## 2021-09-19 DIAGNOSIS — G4733 Obstructive sleep apnea (adult) (pediatric): Secondary | ICD-10-CM | POA: Diagnosis not present

## 2021-09-26 ENCOUNTER — Other Ambulatory Visit: Payer: Self-pay

## 2021-09-26 ENCOUNTER — Ambulatory Visit (INDEPENDENT_AMBULATORY_CARE_PROVIDER_SITE_OTHER): Payer: Medicare Other | Admitting: Cardiology

## 2021-09-26 ENCOUNTER — Encounter: Payer: Self-pay | Admitting: Cardiology

## 2021-09-26 VITALS — BP 138/70 | HR 65 | Ht 72.0 in | Wt 278.6 lb

## 2021-09-26 DIAGNOSIS — I10 Essential (primary) hypertension: Secondary | ICD-10-CM | POA: Diagnosis not present

## 2021-09-26 DIAGNOSIS — E669 Obesity, unspecified: Secondary | ICD-10-CM

## 2021-09-26 DIAGNOSIS — E785 Hyperlipidemia, unspecified: Secondary | ICD-10-CM

## 2021-09-26 DIAGNOSIS — E1169 Type 2 diabetes mellitus with other specified complication: Secondary | ICD-10-CM | POA: Diagnosis not present

## 2021-09-26 DIAGNOSIS — I2510 Atherosclerotic heart disease of native coronary artery without angina pectoris: Secondary | ICD-10-CM | POA: Diagnosis not present

## 2021-09-26 DIAGNOSIS — Z9989 Dependence on other enabling machines and devices: Secondary | ICD-10-CM

## 2021-09-26 DIAGNOSIS — I7121 Aneurysm of the ascending aorta, without rupture: Secondary | ICD-10-CM | POA: Diagnosis not present

## 2021-09-26 DIAGNOSIS — Z794 Long term (current) use of insulin: Secondary | ICD-10-CM | POA: Diagnosis not present

## 2021-09-26 DIAGNOSIS — E119 Type 2 diabetes mellitus without complications: Secondary | ICD-10-CM | POA: Diagnosis not present

## 2021-09-26 DIAGNOSIS — I119 Hypertensive heart disease without heart failure: Secondary | ICD-10-CM | POA: Diagnosis not present

## 2021-09-26 DIAGNOSIS — G4733 Obstructive sleep apnea (adult) (pediatric): Secondary | ICD-10-CM

## 2021-09-26 DIAGNOSIS — Z9861 Coronary angioplasty status: Secondary | ICD-10-CM

## 2021-09-26 NOTE — Progress Notes (Signed)
Primary Care Provider: Merrilee Seashore, MD Cardiologist: Glenetta Hew, MD Electrophysiologist: None  Clinic Note: Chief Complaint  Patient presents with   Follow-up    Delayed annual-80-month   Coronary Artery Disease    No active angina    ===================================  ASSESSMENT/PLAN   Problem List Items Addressed This Visit       Cardiology Problems   CAD S/P percutaneous coronary angioplasty (Chronic)    Patient history of DES stent in 2011.  Myoview in 2018 and 2020 nonischemic. Marland Kitchen  No longer on Thienopyridine antiplatelet agent.  On aspirin alone.  Okay to hold aspirin 5 to 7 days preop for surgeries or procedures.      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  Blood pressures are little high but reasonable.  He is doing monitor his pressures at home. He is on max dose Azor and standing dose Lasix with additional PRN dosing. Not currently on a beta-blocker, has been held in the past because of bradycardia and fatigue.      Relevant Orders   CT ANGIO CHEST AORTA W/CM & OR WO/CM   Coronary artery disease involving native coronary artery of native heart without angina pectoris - Primary (Chronic)    Patient history of PCI back in 2011.  This is prior to him moving to [place].  He has not had any further anginal symptoms.  He is on the L portion of Azor-amlodipine 10 mg.  On Vytorin and Tricor for lipids.  On aspirin.  Not on beta-blocker because of history of fatigue and bradycardia.      Relevant Orders   EKG 12-Lead (Completed)   Basic metabolic panel   Thoracic aortic aneurysm without rupture ([place]) - Noted on chest CT. 4.5 cm (Chronic)    Due for follow-up CTA.  He has not seen Dr. Cyndia Bent a while.  Check CTA chest abdomen aorta to reassess.  Needs to get back in for a visit with Dr.  Cyndia Bent.  Continue BP lipid and diabetic management.      Relevant Orders   CT ANGIO CHEST AORTA W/CM & OR WO/CM   Basic metabolic panel   Benign hypertension     Other   Obesity (BMI 30-39.9) (Chronic)   Relevant Orders   CT ANGIO CHEST AORTA W/CM & OR WO/CM   OSA on CPAP (Chronic)    Continues to use CPAP.      Diabetes mellitus, type II, insulin dependent (HCC) (Chronic)   Dyslipidemia associated with type 2 diabetes mellitus (HCC) (Chronic)    Most recent lipid panel appears to be pretty well controlled.  I would imagine based on her cholesterol of 115, the LDL is probably stable at roughly 63.  Not reported on K PN.  He is on Vytorin 10-40 along with Tricor.  Labs being followed by PCP.       ===================================  HPI:    Jacob Berger is a 75 y.o. male with a pertinent PMH below who presents today for delayed annual (14-month) follow-up.  Pertinent PMH: CAD - PCI (RCA in 2011),  hypertensive heart disease,  TAA (4.8 cm followed by Dr. 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  Blood pressures were running a little high.  Usually indicated being in the 100-130 mm range.  Was feeling better having been a year out from his left knee surgery.  Building up his mobility.  Still has some swelling and discomfort that limit his walking.  Was doing light weight gain and calisthenics exercises.  No chest pain or pressure.  No exertional dyspnea just occasional fluttering -> fleeting episodes usually at the end of the day.  Not associate with lightheadedness. ->  Has been started on CPAP    CV Review of Symptoms (Summary) Cardiovascular ROS: no chest pain or dyspnea on exertion positive for - irregular heartbeat and Fleeting flip-flops sensation in the chest along with trivial left ankle swelling from surgery. negative for - chest pain, dyspnea on  exertion, irregular heartbeat, orthopnea, paroxysmal nocturnal dyspnea, rapid heart rate, shortness of breath or Syncope/near syncope, TIA/amaurosis fugax, claudication   Recent Hospitalizations:  He had a fall several months ago where he landed on his chest.  He has some soreness in his chest [place] the ER.  Unfortunately cannot see any results of an ER visit.  Reviewed  CV studies:    The following studies were reviewed today: (if available, images/films reviewed: From Epic Chart or Care Everywhere) None:   Interval History:   Jacob Berger presents here today with very delayed follow-up.  He is doing pretty well Jacob Berger standpoint.  He still has fluttering sensations but nothing prolonged.  The only pain he had in his chest is what he had likely bruised ribs from the fall.  He will is not any anginal symptoms with rest or exertion.  No symptoms of arrhythmia. No PND orthopnea or edema.  Every now and then he will feel a little bit weak when he wakes up in the morning and he might have a little bit of twinge in his chest but this is not very often and not associate with any particular activity.  He has left ankle swelling but no true edema.  He takes usually 20 mg of Lasix but occasionally will take 40.  No PND orthopnea.  He is using CPAP.  CV Review of Symptoms (Summary) Cardiovascular ROS: positive for - irregular heartbeat and fleeting episodes of chest pain usually in the morning. negative for - dyspnea on exertion, edema, orthopnea, paroxysmal nocturnal dyspnea, rapid heart rate, shortness of breath, or syncope/near syncope or TIA/amaurosis fugax or claudication.  REVIEWED OF SYSTEMS   Review of Systems  Constitutional:  Negative for malaise/fatigue and weight loss.  HENT:  Negative for congestion and nosebleeds.   Respiratory:  Negative for cough and shortness of breath.   Cardiovascular:  Negative for leg swelling.       Per HPI  Gastrointestinal:  Negative for blood in  stool and melena.  Genitourinary:  Negative for hematuria.  Musculoskeletal:  Positive for falls (Few months ago.  Loss of balance climbing up stepladder.).  Neurological:  Negative for dizziness, focal weakness and weakness.  Psychiatric/Behavioral:  Negative for depression and memory loss. The patient is not nervous/anxious and does not have insomnia.    I have reviewed and (if needed) personally updated the patient's problem list, medications, allergies, past medical and surgical history, social and family history.   PAST MEDICAL HISTORY   Past Medical History:  Diagnosis Date   Arthritis    "knees" (05/10/2016)   CAD S/P percutaneous coronary angioplasty 2011   a. 2011: s/p PCI to PDA with Endeavor 2.5 mm x 12  mm DES - [place] [facility] b. low-risk NST in 07/2015   Cancer of kidney [facility])    Chronic kidney disease    Daily headache    "recently" (05/10/2016)   Dyslipidemia, goal LDL below 70    Glaucoma    Gout    History of kidney stones    Hypertension, essential    Obesity, Class II, BMI 35-39.9, with comorbidity    BMI 37   OSA on CPAP    PONV (postoperative nausea and vomiting)    after colonoscopy   Prostate cancer ([place]) 2007   Rash since 04-08-16   on neck healing, right arm improving   Thoracic aortic aneurysm without rupture ([place]) - Noted on chest CT. 4.5 cm 02/15/2016   4.5 cm per dr Cyndia Bent note and [facility] ct 01-18-16   Type II diabetes mellitus ([place])     PAST SURGICAL HISTORY   Past Surgical History:  Procedure Laterality Date   ANTERIOR CERVICAL DECOMP/DISCECTOMY FUSION N/A 11/09/2017   Procedure: Cervical four-five, Cervical five-six Anterior discectomy with fusion and plate fixation;  Surgeon: Ditty, Kevan Ny, MD;  Location: [place];  Service: Neurosurgery;  Laterality: N/A;   COLONOSCOPY W/ BIOPSIES AND POLYPECTOMY  "several"   CORONARY ANGIOPLASTY WITH STENT PLACEMENT  2011   [place]: Endeavor 2.5 mm x 12 mm DES - distal PDA ([place]  [facility])   EYE SURGERY     LAPAROSCOPIC NEPHRECTOMY Left 04/13/2016   Procedure: LAPAROSCOPIC RADICAL LEFT NEPHRECTOMY;  Surgeon: Raynelle Bring, MD;  Location: [facility];  Service: Urology;  Laterality: Left;   NM MYOVIEW ([place] [facility])  6/'13, 3/'16   a. Treadmill Myoview: 10 minutes, 12 METS; no ischemia infarction, EF 65%; b. [facility]: LOW RISK. EF 57%. 10.4 METS low risk scan no ischemia. No wall motion abnormality.    NM MYOVIEW LTD  05/2017   EF 50%. Normal function. No ischemia or infarction.   PROSTATECTOMY     REFRACTIVE SURGERY Left    TOTAL KNEE ARTHROPLASTY Left 05/27/2019   Procedure: Left Knee Arthroplasty;  Surgeon: Melrose Nakayama, MD;  Location: [facility];  Service: Orthopedics;  Laterality: Left;   TRANSTHORACIC ECHOCARDIOGRAM  05/2016   EF 60 to 65%.  No R WMA.  GR 1 DD.  Normal valves.   Immunization History  Administered Date(s) Administered   Influenza-Unspecified 07/26/2018   PFIZER(Purple Top)SARS-COV-2 Vaccination 12/14/2019, 01/07/2020   Pneumococcal-Unspecified 10/27/2013, 02/01/2018    MEDICATIONS/ALLERGIES   Current Meds  Medication Sig   allopurinol (ZYLOPRIM) 300 MG tablet Take 300 mg by mouth daily.    aspirin EC 81 MG tablet Take 81 mg by mouth daily.   AZOR 10-40 MG per tablet TAKE 1 TABLET BY MOUTH DAILY   bimatoprost (LUMIGAN) 0.01 % SOLN Place 1 drop into the right eye at bedtime.    Carboxymethylcellulose Sodium (REFRESH LIQUIGEL) 1 % GEL Place 1 drop into the left eye as needed (eye irritation).   diphenhydrAMINE (BENADRYL) 25 MG tablet Take 1 tablet (25 mg total) by mouth every 6 (six) hours as needed for itching.   diphenhydramine-acetaminophen (TYLENOL PM) 25-500 MG TABS tablet Take 1 tablet by mouth at bedtime as needed (sleep).   Dulaglutide 1.5 MG/0.5ML SOPN Inject 1.5 mg into the skin every Thursday. In the afternoon 2pm   fenofibrate (TRICOR) 145 MG tablet Take 145 mg by mouth daily at 2 PM.    furosemide (LASIX) 80 MG tablet Take 20-40  mg by mouth daily as needed (for  fluid).   LEVEMIR FLEXTOUCH 100 UNIT/ML Pen Inject 38 Units into the skin at bedtime.    Methylcellulose, Laxative, 500 MG TABS Take 500 mg by mouth daily.    Multiple Vitamins-Minerals (EMERGEN-C IMMUNE PO) Take 1 tablet by mouth daily.   NOVOLOG FLEXPEN 100 UNIT/ML FlexPen Inject 15 Units into the skin 3 (three) times daily before meals.    QUNOL COQ10/UBIQUINOL/MEGA PO Take 20 mLs by mouth 2 (two) times a week.   sildenafil (VIAGRA) 50 MG tablet Take 1 tablet (total 50 mg)  to 1.5 tablet ( total 75 mg) as needed for erectile dysfunction   timolol (TIMOPTIC) 0.5 % ophthalmic solution Place 1 drop into both eyes 2 (two) times daily. Morning and afternoon   VYTORIN 10-40 MG per tablet Take 1 tablet by mouth at bedtime.     Allergies  Allergen Reactions   Accupril [Quinapril Hcl] Swelling    MOUTH SWELLING   Quinapril Swelling    MOUTH SWELLING MOUTH SWELLING    Quinapril Hcl     SOCIAL HISTORY/FAMILY HISTORY   Reviewed in Epic:  Pertinent findings:  Social History   Tobacco Use   Smoking status: Former    Types: Cigarettes    Quit date: 06/04/1993    Years since quitting: 28.3   Smokeless tobacco: Never   Tobacco comments:    A PK WOULD LAST A WEEK  Vaping Use   Vaping Use: Never used  Substance Use Topics   Alcohol use: Yes    Alcohol/week: 2.0 standard drinks    Types: 1 Glasses of wine, 1 Cans of beer per week    Comment: rare   Drug use: No   Social History   Social History Narrative   Married father of 3. Had been walking up to 2 miles every other day appetite is good his weight for about half an hour time period, but limited now due to back pain. Occasional alcohol. No tobacco products -- he quit about 20-30 years ago.    OBJCTIVE -PE, EKG, labs   Wt Readings from Last 3 Encounters:  09/26/21 278 lb 9.6 oz (126.4 kg)  03/29/20 283 lb (128.4 kg)  10/14/19 271 lb (122.9 kg)    Physical Exam: BP 138/70 (BP Location: Left  Arm)   Pulse 65   Ht 6' (1.829 m)   Wt 278 lb 9.6 oz (126.4 kg)   SpO2 96%   BMI 37.78 kg/m   Physical Exam Vitals reviewed.  Constitutional:      General: He is not in acute distress.    Appearance: Normal appearance. He is obese. He is not ill-appearing or toxic-appearing.     Comments: Well-nourished, well-groomed.  Healthy-appearing.  Borderline morbid obese  HENT:     Head: Normocephalic and atraumatic.  Neck:     Vascular: No carotid bruit, hepatojugular reflux or JVD.  Cardiovascular:     Rate and Rhythm: Normal rate and regular rhythm. Occasional Extrasystoles are present.    Chest Wall: PMI is not displaced.     Pulses: Normal pulses.     Heart sounds: S1 normal and S2 normal. Heart sounds are distant. Murmur heard.  Harsh crescendo-decrescendo early systolic murmur is present with a grade of 1/6 at the upper right sternal border radiating to the neck.    No friction rub. No gallop.  Pulmonary:     Effort: Pulmonary effort is normal. No respiratory distress.     Breath sounds: Normal breath sounds.  Chest:  Chest wall: No tenderness.  Musculoskeletal:        General: No swelling. Normal range of motion.     Cervical back: Normal range of motion and neck supple.  Skin:    General: Skin is warm and dry.  Neurological:     General: No focal deficit present.     Mental Status: He is alert and oriented to person, place, and time.  Psychiatric:        Mood and Affect: Mood normal.        Behavior: Behavior normal.        Thought Content: Thought content normal.        Judgment: Judgment normal.     Adult ECG Report  Rate: 65;  Rhythm: normal sinus rhythm, sinus arrhythmia, and nonspecific ST and T wave changes.  Otherwise normal axis, intervals durations. ;   Narrative Interpretation: Stable  Recent Labs:   06/06/2021: TC 150, TG 114, HDL 33.  Most recent LDL reported was from 01/12/2021 was 63. 05/23/2021: Cr 1.2, K+ 3.7, 06/06/2021-TSH 3.25.   Lab Results   Component Value Date   HGBA1C 7.4 (H) 05/23/2019   No results found for: TSH  ==================================================  COVID-19 Education: The signs and symptoms of COVID-19 were discussed with the patient and how to seek care for testing (follow up with PCP or arrange E-visit).    I spent a total of 26 minutes with the patient spent in direct patient consultation.  Additional time spent with chart review  / charting (studies, outside notes, etc): 16 min Total Time: 42 min  Current medicines are reviewed at length with the patient today.  (+/- concerns) none  This visit occurred during the SARS-CoV-2 public health emergency.  Safety protocols were in place, including screening questions prior to the visit, additional usage of staff PPE, and extensive cleaning of exam room while observing appropriate contact time as indicated for disinfecting solutions.  Notice: This dictation was prepared with Dragon dictation along with smart phrase technology. Any transcriptional errors that result from this process are unintentional and may not be corrected upon review.  Studies Ordered:   Orders Placed This Encounter  Procedures   CT ANGIO CHEST AORTA W/CM & OR WO/CM   Basic metabolic panel   EKG [id number]     Patient Instructions / Medication Changes & Studies & Tests Ordered   Patient Instructions  Medication Instructions:   Not needed *If you need a refill on your cardiac medications before your next appointment, please call your pharmacy*   Lab Work: BMP - if needed for CT Angiogram of the chest - aorta  w and w/o contrast  If you have labs (blood work) drawn today and your tests are completely normal, you will receive your results only by: MyChart Message (if you have MyChart) OR A paper copy in the mail If you have any lab test that is abnormal or we need to change your treatment, we will call you to review the results.   Testing/Procedures: Will be schedule at [facility] Radiology dept - Non-Cardiac CT Angiography (CTA), is a special type of CT scan that uses a computer to produce multi-dimensional views of major blood vessels of Aorta  . In CT angiography, a contrast material is injected through an IV to help visualize the blood vessels    Follow-Up: At [facility], you and your health needs are our priority.  As part of our continuing mission to provide you with  exceptional heart care, we have created designated Provider Care Teams.  These Care Teams include your primary Cardiologist (physician) and Advanced Practice Providers (APPs -  Physician Assistants and Nurse Practitioners) who all work together to provide you with the care you need, when you need it.  We recommend signing up for the patient portal called "MyChart".  Sign up information is provided on this After Visit Summary.  MyChart is used to connect with patients for Virtual Visits (Telemedicine).  Patients are able to view lab/test results, encounter notes, upcoming appointments, etc.  Non-urgent messages can be sent to your provider as well.   To learn more about what you can do with MyChart, go to [email or web address].    Your next appointment:   12 month(s)  The format for your next appointment:   In Person  Provider:   Glenetta Hew, MD          Glenetta Hew, M.D., M.S. Interventional Cardiologist   Pager # [phone] Phone # [phone] [address] [place], [place] [address]   Thank you for choosing Heartcare at [facility]!!

## 2021-09-26 NOTE — Patient Instructions (Addendum)
Medication Instructions:   Not needed *If you need a refill on your cardiac medications before your next appointment, please call your pharmacy*   Lab Work: BMP - if needed for CT Angiogram of the chest - aorta  w and w/o contrast  If you have labs (blood work) drawn today and your tests are completely normal, you will receive your results only by: [place] (if you have MyChart) OR A paper copy in the mail If you have any lab test that is abnormal or we need to change your treatment, we will call you to review the results.   Testing/Procedures: Will be schedule at [facility] Radiology dept - Non-Cardiac CT Angiography (CTA), is a special type of CT scan that uses a computer to produce multi-dimensional views of major blood vessels of Aorta  . In CT angiography, a contrast material is injected through an IV to help visualize the blood vessels    Follow-Up: At [facility], you and your health needs are our priority.  As part of our continuing mission to provide you with exceptional heart care, we have created designated Provider Care Teams.  These Care Teams include your primary Cardiologist (physician) and Advanced Practice Providers (APPs -  Physician Assistants and Nurse Practitioners) who all work together to provide you with the care you need, when you need it.  We recommend signing up for the patient portal called "MyChart".  Sign up information is provided on this After Visit Summary.  MyChart is used to connect with patients for Virtual Visits (Telemedicine).  Patients are able to view lab/test results, encounter notes, upcoming appointments, etc.  Non-urgent messages can be sent to your provider as well.   To learn more about what you can do with MyChart, go to [email or web address].    Your next appointment:   12 month(s)  The format for your next appointment:   In Person  Provider:   Glenetta Hew, MD

## 2021-10-20 ENCOUNTER — Encounter: Payer: Self-pay | Admitting: Cardiology

## 2021-10-20 NOTE — Assessment & Plan Note (Addendum)
Due for follow-up CTA.  He has not seen Dr. Cyndia Bent a while.  Check CTA chest abdomen aorta to reassess.  Needs to get back in for a visit with Dr. Cyndia Bent.  Continue BP lipid and diabetic management.

## 2021-10-20 NOTE — Assessment & Plan Note (Signed)
Patient history of DES stent in 2011.  Myoview in 2018 and 2020 nonischemic. Marland Kitchen  No longer on Thienopyridine antiplatelet agent.  On aspirin alone.   Okay to hold aspirin 5 to 7 days preop for surgeries or procedures.

## 2021-10-20 NOTE — Assessment & Plan Note (Signed)
Continues to use CPAP.

## 2021-10-20 NOTE — Assessment & Plan Note (Signed)
No active CHF symptoms.  Blood pressures are little high but reasonable.  He is doing monitor his pressures at home. He is on max dose Azor and standing dose Lasix with additional PRN dosing. Not currently on a beta-blocker, has been held in the past because of bradycardia and fatigue.

## 2021-10-20 NOTE — Assessment & Plan Note (Signed)
Most recent lipid panel appears to be pretty well controlled.  I would imagine based on her cholesterol of 115, the LDL is probably stable at roughly 63.  Not reported on K PN.  He is on Vytorin 10-40 along with Tricor.  Labs being followed by PCP.

## 2021-10-20 NOTE — Assessment & Plan Note (Signed)
Patient history of PCI back in 2011.  This is prior to him moving to [place].  He has not had any further anginal symptoms.  He is on the L portion of Azor-amlodipine 10 mg.  On Vytorin and Tricor for lipids.  On aspirin.  Not on beta-blocker because of history of fatigue and bradycardia.

## 2021-11-14 DIAGNOSIS — H04123 Dry eye syndrome of bilateral lacrimal glands: Secondary | ICD-10-CM | POA: Diagnosis not present

## 2021-11-14 DIAGNOSIS — H401133 Primary open-angle glaucoma, bilateral, severe stage: Secondary | ICD-10-CM | POA: Diagnosis not present

## 2021-11-28 DIAGNOSIS — I700 Atherosclerosis of aorta: Secondary | ICD-10-CM | POA: Diagnosis not present

## 2021-11-28 DIAGNOSIS — I119 Hypertensive heart disease without heart failure: Secondary | ICD-10-CM | POA: Diagnosis not present

## 2021-11-28 DIAGNOSIS — E782 Mixed hyperlipidemia: Secondary | ICD-10-CM | POA: Diagnosis not present

## 2021-11-28 DIAGNOSIS — E1165 Type 2 diabetes mellitus with hyperglycemia: Secondary | ICD-10-CM | POA: Diagnosis not present

## 2021-11-28 DIAGNOSIS — N1831 Chronic kidney disease, stage 3a: Secondary | ICD-10-CM | POA: Diagnosis not present

## 2021-11-28 DIAGNOSIS — I712 Thoracic aortic aneurysm, without rupture, unspecified: Secondary | ICD-10-CM | POA: Diagnosis not present

## 2021-11-28 DIAGNOSIS — Z794 Long term (current) use of insulin: Secondary | ICD-10-CM | POA: Diagnosis not present

## 2021-12-05 DIAGNOSIS — M159 Polyosteoarthritis, unspecified: Secondary | ICD-10-CM | POA: Diagnosis not present

## 2021-12-05 DIAGNOSIS — N1831 Chronic kidney disease, stage 3a: Secondary | ICD-10-CM | POA: Diagnosis not present

## 2021-12-05 DIAGNOSIS — I119 Hypertensive heart disease without heart failure: Secondary | ICD-10-CM | POA: Diagnosis not present

## 2021-12-05 DIAGNOSIS — I700 Atherosclerosis of aorta: Secondary | ICD-10-CM | POA: Diagnosis not present

## 2021-12-05 DIAGNOSIS — E1140 Type 2 diabetes mellitus with diabetic neuropathy, unspecified: Secondary | ICD-10-CM | POA: Diagnosis not present

## 2021-12-05 DIAGNOSIS — Z794 Long term (current) use of insulin: Secondary | ICD-10-CM | POA: Diagnosis not present

## 2021-12-05 DIAGNOSIS — E782 Mixed hyperlipidemia: Secondary | ICD-10-CM | POA: Diagnosis not present

## 2021-12-21 ENCOUNTER — Other Ambulatory Visit: Payer: Self-pay

## 2021-12-21 ENCOUNTER — Ambulatory Visit (HOSPITAL_COMMUNITY)
Admission: RE | Admit: 2021-12-21 | Discharge: 2021-12-21 | Disposition: A | Discharge status: Home / Self Care | Payer: Medicare Other | Source: Ambulatory Visit | Attending: Cardiology | Admitting: Cardiology

## 2021-12-21 DIAGNOSIS — E669 Obesity, unspecified: Secondary | ICD-10-CM | POA: Diagnosis not present

## 2021-12-21 DIAGNOSIS — I2510 Atherosclerotic heart disease of native coronary artery without angina pectoris: Secondary | ICD-10-CM | POA: Diagnosis not present

## 2021-12-21 DIAGNOSIS — I119 Hypertensive heart disease without heart failure: Secondary | ICD-10-CM | POA: Diagnosis not present

## 2021-12-21 DIAGNOSIS — Z9861 Coronary angioplasty status: Secondary | ICD-10-CM | POA: Insufficient documentation

## 2021-12-21 DIAGNOSIS — I7121 Aneurysm of the ascending aorta, without rupture: Secondary | ICD-10-CM | POA: Insufficient documentation

## 2021-12-21 DIAGNOSIS — J9811 Atelectasis: Secondary | ICD-10-CM | POA: Diagnosis not present

## 2021-12-21 LAB — POCT I-STAT CREATININE: Creatinine, Ser: 1.9 mg/dL — ABNORMAL HIGH (ref 0.61–1.24)

## 2021-12-21 IMAGING — CT CT ANGIO CHEST
2 of 7 series · 15 of 46 positions shown · IV contrast (agent unspecified)
Comparison: Chest CT-[DATE]; [DATE]; [DATE];
[DATE]; [DATE]; [DATE]; [DATE]; [DATE]

CLINICAL DATA: Follow-up thoracic aortic aneurysm.

EXAM:
CT ANGIOGRAPHY CHEST WITH CONTRAST
TECHNIQUE: Multidetector CT imaging of the chest was performed using the
standard protocol during bolus administration of intravenous
contrast. Multiplanar CT image reconstructions and MIPs were
obtained to evaluate the vascular anatomy.

[Series 8: dissection 2mm · axial · 0.91mm/px · z∈[+1223,+1517]mm · 12 of 167 slices shown]
[im 10/167  lung]
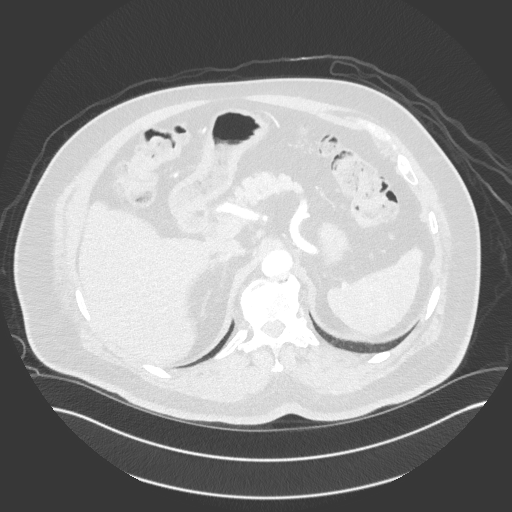
[im 30/167  soft-tissue]
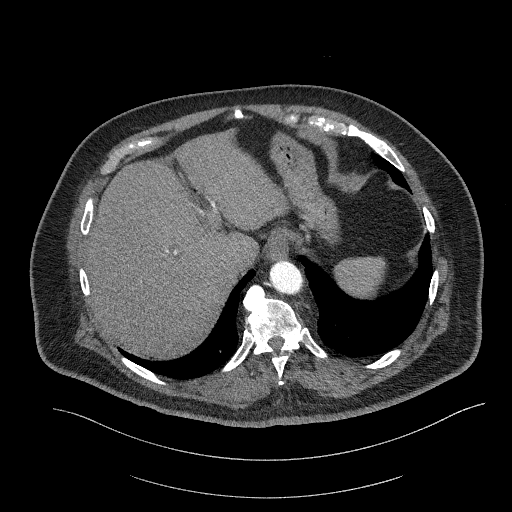
[im 40/167  lung]
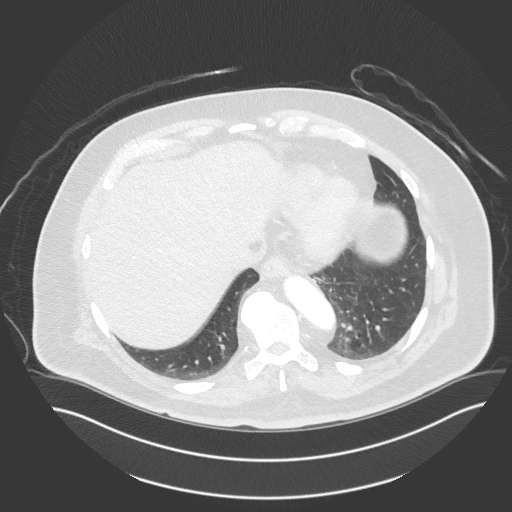
[im 49/167  soft-tissue]
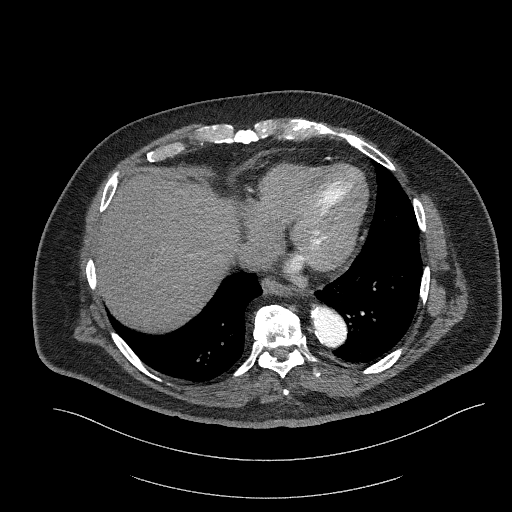
[im 69/167  lung]
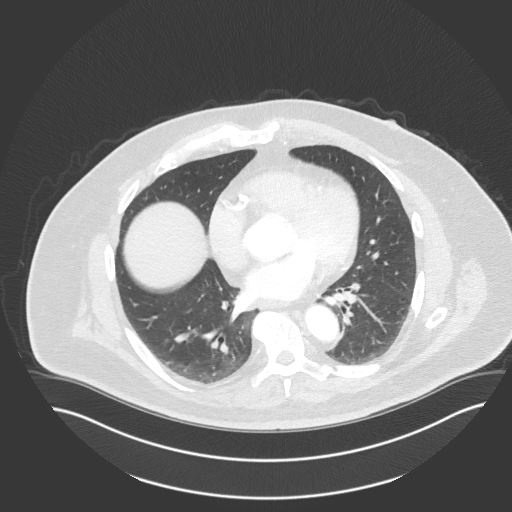
[im 79/167  soft-tissue]
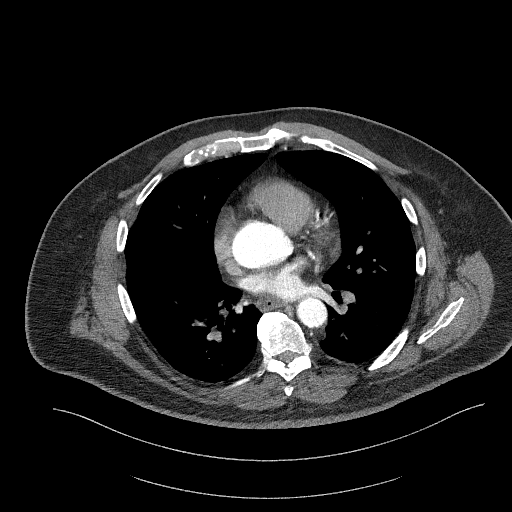
[im 88/167  lung]
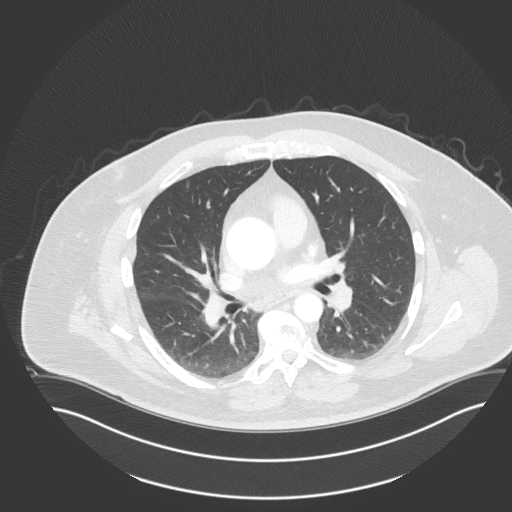
[im 108/167  soft-tissue]
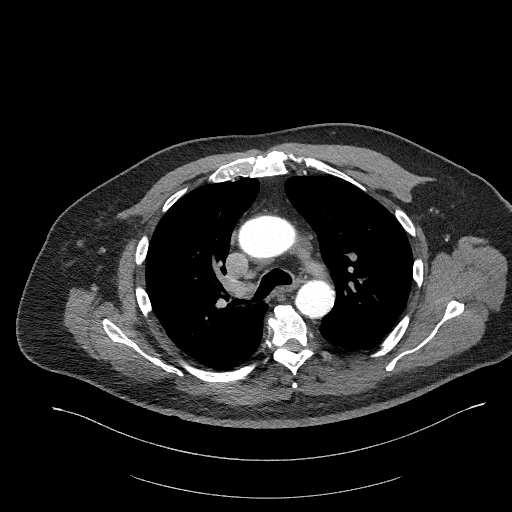
[im 118/167  lung]
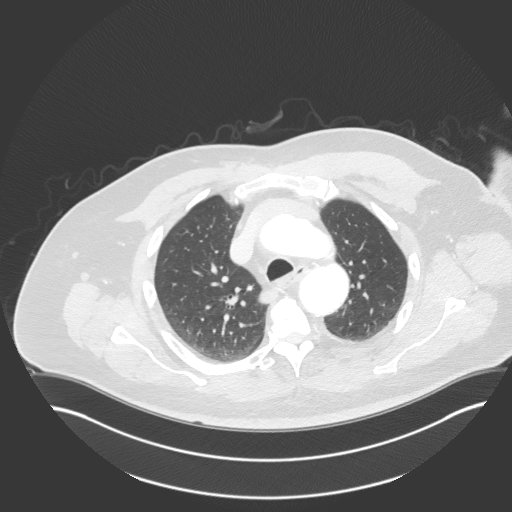
[im 127/167  soft-tissue]
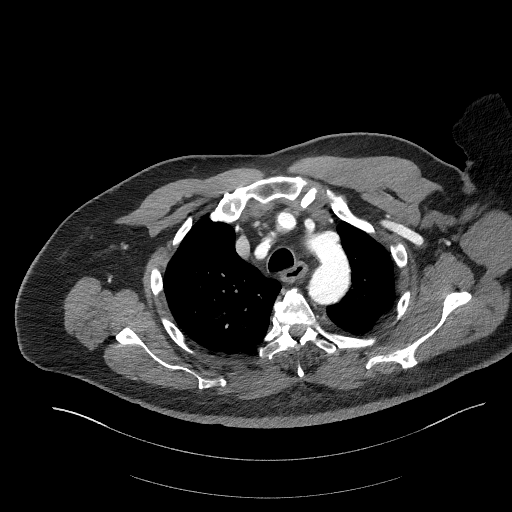
[im 147/167  lung]
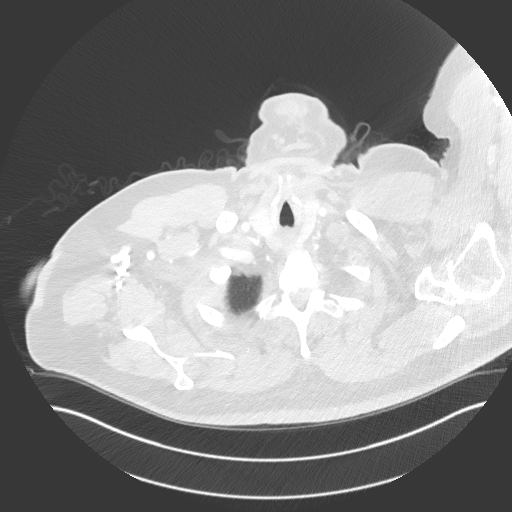
[im 157/167  soft-tissue]
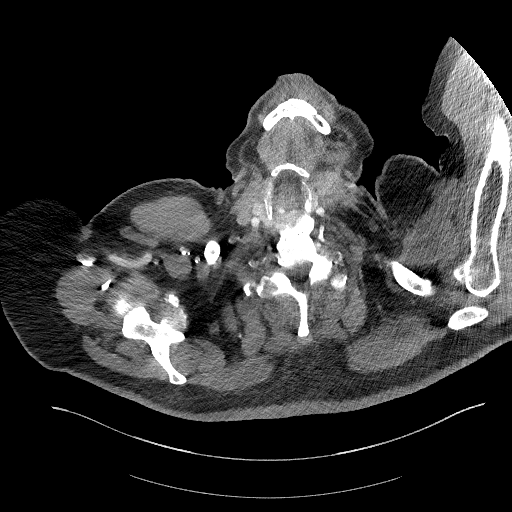

[Series 11: dissection 2mm cor · coronal · 0.67mm/px · 3 of 162 slices shown]
[im 41/162  soft-tissue]
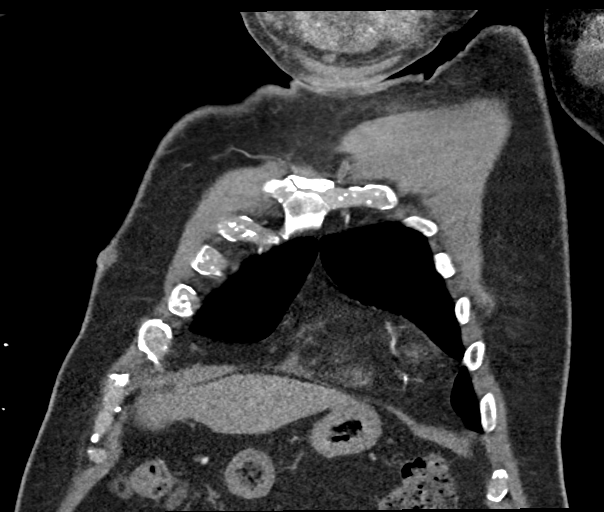
[im 81/162  soft-tissue]
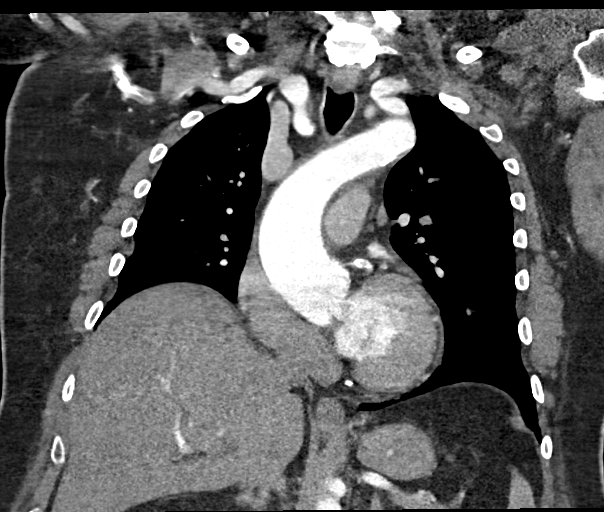
[im 121/162  soft-tissue]
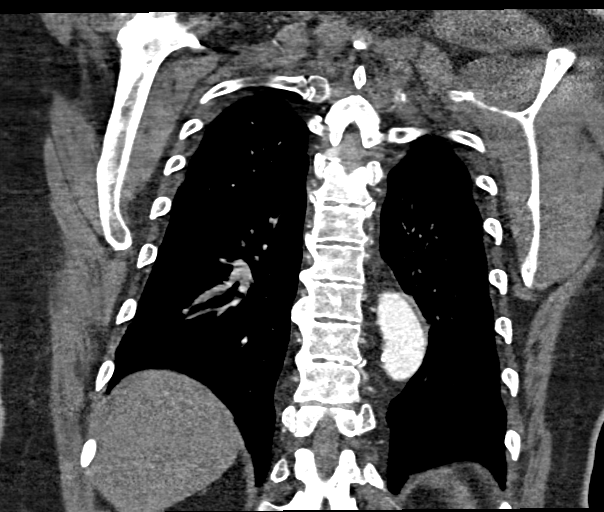

[15 of 46 positions shown; findings below may reference images not displayed]

RADIATION DOSE REDUCTION: This exam was performed according to the
departmental dose-optimization program which includes automated
exposure control, adjustment of the mA and/or kV according to
patient size and/or use of iterative reconstruction technique.

CONTRAST:  60mL OMNIPAQUE IOHEXOL 350 MG/ML SOLN
FINDINGS: Vascular Findings:

Stable mild fusiform aneurysmal dilatation of the ascending thoracic
aorta with measurements as follows. The thoracic aorta tapers to a
normal caliber at the level of the aortic arch however there is
tortuosity and unchanged mild fusiform aneurysmal dilatation
involving the proximal aspect of the descending thoracic aorta.
Remainder of the descending thoracic aorta is tortuous but of normal
caliber. No evidence of thoracic aortic dissection or perivascular
stranding on this nongated examination. Moderate amount of slightly
irregular mixed calcified and noncalcified atherosclerotic plaque
involving the aortic arch and descending thoracic aorta, not
resulting in a hemodynamically significant stenosis.

Conventional configuration of the aortic arch. There is a moderate
amount of eccentric mixed calcified and noncalcified atherosclerotic
plaque involving the proximal aspect the right subclavian artery
(coronal image 77, series 11; axial image 40, series 8), which
approaches 50% luminal narrowing, unchanged. The branch vessels of
the aortic arch are tortuous though patent throughout their imaged
courses

Normal heart size. Extensive coronary artery calcifications. No
pericardial effusion, though small amount of fluid is seen with the
pericardial recess.

Although this examination was not tailored for the evaluation the
pulmonary arteries, there are no discrete filling defects within the
central pulmonary arterial tree to suggest central pulmonary
embolism. Normal caliber of the main pulmonary artery.

-------------------------------------------------------------

Thoracic aortic measurements:

SINOTUBULAR JUNCTION: 46 mm as measured in greatest oblique short
axis coronal dimension.

PROXIMAL ASCENDING THORACIC AORTA: 45 mm as measured in greatest
oblique short axis axial dimension (image 76, series 8) at the level
of the main pulmonary artery and approximately 45 mm as measured in
greatest oblique short axis coronal dimension (coronal image 78,
series 11), unchanged compared to the [DATE] examination by my
direct remeasurement.

AORTIC ARCH: 30 mm as measured in greatest oblique short axis
sagittal dimension.

PROXIMAL DESCENDING THORACIC AORTA: Redemonstrated fusiform
aneurysmal dilatation involving the proximal aspect of the
descending thoracic aorta measuring 41 mm in diameter (as measured
in greatest oblique short axis axial, image 47, series 8; and
sagittal, image 126, series 12 dimensions), unchanged compared to
the [DATE] examination by my direct remeasurement. The descending
thoracic aorta tapers to a normal caliber measuring 27 mm as
measured in greatest oblique short axis axial dimension at the level
of the main pulmonary artery.

DISTAL DESCENDING THORACIC AORTA: 28 mm as measured in greatest
oblique short axis axial dimension at the level of the diaphragmatic
hiatus.

Review of the MIP images confirms the above findings.

-------------------------------------------------------------

Non-Vascular Findings:

Mediastinum/Lymph Nodes: No bulky mediastinal, hilar or axillary
lymphadenopathy.

Lungs/Pleura: Mildly prominent right-sided perifissural lymph nodes
(represent image 106, series 9), unchanged since at least the
[DATE] examination and thus of benign etiology. No discrete
worrisome pulmonary nodules. Minimal dependent subpleural
ground-glass atelectasis. No discrete focal airspace opacities. No
air bronchograms. No pleural effusion or pneumothorax. The central
pulmonary airways appear widely patent.

Upper abdomen: Limited early arterial phase evaluation of the upper
abdomen demonstrates diffuse decreased attenuation of the hepatic
parenchyma suggestive of hepatic steatosis.

Musculoskeletal: No acute or aggressive osseous abnormalities.
Stigmata of dish throughout the thoracolumbar spine. Post lower
cervical ACDF, incompletely evaluated. Mild-to-moderate degenerative
change the bilateral glenohumeral joints, right greater than left,
incompletely evaluated. Mild thyromegaly without discrete nodule.
Regional soft tissues appear normal.
IMPRESSION: 1. Stable uncomplicated fusiform aneurysmal dilatation of the
ascending thoracic aorta and proximal aspect of the descending
thoracic aorta measuring 45 mm and 41 mm in diameter respectively,
unchanged compared to the [DATE] examination. Aortic aneurysm NOS
([8Y]-[8Y]).
2. Moderate amount of calcified atherosclerotic plaque involving the
proximal aspect the right subclavian artery which approaches 50%
luminal narrowing, unchanged. If there is clinical concern for
subclavian steal syndrome, further evaluation with the acquisition
of bilateral upper extremity blood pressure measurements as well as
carotid Doppler ultrasound could be performed as indicated.
3. Coronary artery calcifications. Aortic Atherosclerosis
([8Y]-[8Y]).
4. Suspected hepatic steatosis.

## 2021-12-21 MED ORDER — IOHEXOL 350 MG/ML SOLN
60.0000 mL | Freq: Once | INTRAVENOUS | Status: AC | PRN
Start: 2021-12-21 — End: 2021-12-21
  Administered 2021-12-21: 60 mL via INTRAVENOUS

## 2021-12-22 DIAGNOSIS — H401133 Primary open-angle glaucoma, bilateral, severe stage: Secondary | ICD-10-CM | POA: Diagnosis not present

## 2021-12-22 DIAGNOSIS — Z79899 Other long term (current) drug therapy: Secondary | ICD-10-CM | POA: Diagnosis not present

## 2021-12-22 DIAGNOSIS — H04123 Dry eye syndrome of bilateral lacrimal glands: Secondary | ICD-10-CM | POA: Diagnosis not present

## 2022-01-30 DIAGNOSIS — H04123 Dry eye syndrome of bilateral lacrimal glands: Secondary | ICD-10-CM | POA: Diagnosis not present

## 2022-01-30 DIAGNOSIS — Z888 Allergy status to other drugs, medicaments and biological substances status: Secondary | ICD-10-CM | POA: Diagnosis not present

## 2022-01-30 DIAGNOSIS — H16003 Unspecified corneal ulcer, bilateral: Secondary | ICD-10-CM | POA: Diagnosis not present

## 2022-01-30 DIAGNOSIS — H401133 Primary open-angle glaucoma, bilateral, severe stage: Secondary | ICD-10-CM | POA: Diagnosis not present

## 2022-01-30 DIAGNOSIS — H02403 Unspecified ptosis of bilateral eyelids: Secondary | ICD-10-CM | POA: Diagnosis not present

## 2022-03-17 DIAGNOSIS — H401133 Primary open-angle glaucoma, bilateral, severe stage: Secondary | ICD-10-CM | POA: Diagnosis not present

## 2022-03-17 DIAGNOSIS — H02843 Edema of right eye, unspecified eyelid: Secondary | ICD-10-CM | POA: Diagnosis not present

## 2022-03-17 DIAGNOSIS — H02846 Edema of left eye, unspecified eyelid: Secondary | ICD-10-CM | POA: Diagnosis not present

## 2022-03-17 DIAGNOSIS — H02883 Meibomian gland dysfunction of right eye, unspecified eyelid: Secondary | ICD-10-CM | POA: Diagnosis not present

## 2022-03-17 DIAGNOSIS — Z888 Allergy status to other drugs, medicaments and biological substances status: Secondary | ICD-10-CM | POA: Diagnosis not present

## 2022-03-17 DIAGNOSIS — H02886 Meibomian gland dysfunction of left eye, unspecified eyelid: Secondary | ICD-10-CM | POA: Diagnosis not present

## 2022-03-17 DIAGNOSIS — G514 Facial myokymia: Secondary | ICD-10-CM | POA: Diagnosis not present

## 2022-03-17 DIAGNOSIS — H04123 Dry eye syndrome of bilateral lacrimal glands: Secondary | ICD-10-CM | POA: Diagnosis not present

## 2022-03-17 DIAGNOSIS — H16143 Punctate keratitis, bilateral: Secondary | ICD-10-CM | POA: Diagnosis not present

## 2022-03-17 DIAGNOSIS — Z961 Presence of intraocular lens: Secondary | ICD-10-CM | POA: Diagnosis not present

## 2022-03-17 DIAGNOSIS — H02403 Unspecified ptosis of bilateral eyelids: Secondary | ICD-10-CM | POA: Diagnosis not present

## 2022-03-20 DIAGNOSIS — G4733 Obstructive sleep apnea (adult) (pediatric): Secondary | ICD-10-CM | POA: Diagnosis not present

## 2022-03-20 DIAGNOSIS — I119 Hypertensive heart disease without heart failure: Secondary | ICD-10-CM | POA: Diagnosis not present

## 2022-03-20 DIAGNOSIS — Z6838 Body mass index (BMI) 38.0-38.9, adult: Secondary | ICD-10-CM | POA: Diagnosis not present

## 2022-03-20 DIAGNOSIS — I2510 Atherosclerotic heart disease of native coronary artery without angina pectoris: Secondary | ICD-10-CM | POA: Diagnosis not present

## 2022-03-20 DIAGNOSIS — E1121 Type 2 diabetes mellitus with diabetic nephropathy: Secondary | ICD-10-CM | POA: Diagnosis not present

## 2022-03-20 DIAGNOSIS — E118 Type 2 diabetes mellitus with unspecified complications: Secondary | ICD-10-CM | POA: Diagnosis not present

## 2022-03-20 DIAGNOSIS — E782 Mixed hyperlipidemia: Secondary | ICD-10-CM | POA: Diagnosis not present

## 2022-03-20 DIAGNOSIS — E1140 Type 2 diabetes mellitus with diabetic neuropathy, unspecified: Secondary | ICD-10-CM | POA: Diagnosis not present

## 2022-03-20 DIAGNOSIS — I10 Essential (primary) hypertension: Secondary | ICD-10-CM | POA: Diagnosis not present

## 2022-03-20 DIAGNOSIS — I129 Hypertensive chronic kidney disease with stage 1 through stage 4 chronic kidney disease, or unspecified chronic kidney disease: Secondary | ICD-10-CM | POA: Diagnosis not present

## 2022-03-20 DIAGNOSIS — Z794 Long term (current) use of insulin: Secondary | ICD-10-CM | POA: Diagnosis not present

## 2022-03-20 DIAGNOSIS — N1831 Chronic kidney disease, stage 3a: Secondary | ICD-10-CM | POA: Diagnosis not present

## 2022-05-15 DIAGNOSIS — H04123 Dry eye syndrome of bilateral lacrimal glands: Secondary | ICD-10-CM | POA: Diagnosis not present

## 2022-05-15 DIAGNOSIS — H02843 Edema of right eye, unspecified eyelid: Secondary | ICD-10-CM | POA: Diagnosis not present

## 2022-05-15 DIAGNOSIS — H02842 Edema of right lower eyelid: Secondary | ICD-10-CM | POA: Diagnosis not present

## 2022-05-15 DIAGNOSIS — H02845 Edema of left lower eyelid: Secondary | ICD-10-CM | POA: Diagnosis not present

## 2022-05-15 DIAGNOSIS — H401133 Primary open-angle glaucoma, bilateral, severe stage: Secondary | ICD-10-CM | POA: Diagnosis not present

## 2022-05-15 DIAGNOSIS — H02841 Edema of right upper eyelid: Secondary | ICD-10-CM | POA: Diagnosis not present

## 2022-05-15 DIAGNOSIS — Z79899 Other long term (current) drug therapy: Secondary | ICD-10-CM | POA: Diagnosis not present

## 2022-05-15 DIAGNOSIS — H02846 Edema of left eye, unspecified eyelid: Secondary | ICD-10-CM | POA: Diagnosis not present

## 2022-05-15 DIAGNOSIS — H02844 Edema of left upper eyelid: Secondary | ICD-10-CM | POA: Diagnosis not present

## 2022-06-26 DIAGNOSIS — R5383 Other fatigue: Secondary | ICD-10-CM | POA: Diagnosis not present

## 2022-06-26 DIAGNOSIS — M159 Polyosteoarthritis, unspecified: Secondary | ICD-10-CM | POA: Diagnosis not present

## 2022-06-26 DIAGNOSIS — E1140 Type 2 diabetes mellitus with diabetic neuropathy, unspecified: Secondary | ICD-10-CM | POA: Diagnosis not present

## 2022-06-26 DIAGNOSIS — Z0000 Encounter for general adult medical examination without abnormal findings: Secondary | ICD-10-CM | POA: Diagnosis not present

## 2022-06-26 DIAGNOSIS — Z794 Long term (current) use of insulin: Secondary | ICD-10-CM | POA: Diagnosis not present

## 2022-06-26 DIAGNOSIS — I700 Atherosclerosis of aorta: Secondary | ICD-10-CM | POA: Diagnosis not present

## 2022-06-26 DIAGNOSIS — E782 Mixed hyperlipidemia: Secondary | ICD-10-CM | POA: Diagnosis not present

## 2022-06-26 DIAGNOSIS — I712 Thoracic aortic aneurysm, without rupture, unspecified: Secondary | ICD-10-CM | POA: Diagnosis not present

## 2022-06-26 DIAGNOSIS — I119 Hypertensive heart disease without heart failure: Secondary | ICD-10-CM | POA: Diagnosis not present

## 2022-06-26 DIAGNOSIS — N1831 Chronic kidney disease, stage 3a: Secondary | ICD-10-CM | POA: Diagnosis not present

## 2022-07-04 DIAGNOSIS — H02843 Edema of right eye, unspecified eyelid: Secondary | ICD-10-CM | POA: Diagnosis not present

## 2022-07-04 DIAGNOSIS — H401133 Primary open-angle glaucoma, bilateral, severe stage: Secondary | ICD-10-CM | POA: Diagnosis not present

## 2022-07-04 DIAGNOSIS — H04123 Dry eye syndrome of bilateral lacrimal glands: Secondary | ICD-10-CM | POA: Diagnosis not present

## 2022-07-04 DIAGNOSIS — H02846 Edema of left eye, unspecified eyelid: Secondary | ICD-10-CM | POA: Diagnosis not present

## 2022-08-30 DIAGNOSIS — H401113 Primary open-angle glaucoma, right eye, severe stage: Secondary | ICD-10-CM | POA: Diagnosis not present

## 2022-08-30 DIAGNOSIS — H401133 Primary open-angle glaucoma, bilateral, severe stage: Secondary | ICD-10-CM | POA: Diagnosis not present

## 2022-08-31 DIAGNOSIS — H18413 Arcus senilis, bilateral: Secondary | ICD-10-CM | POA: Diagnosis not present

## 2022-08-31 DIAGNOSIS — H43812 Vitreous degeneration, left eye: Secondary | ICD-10-CM | POA: Diagnosis not present

## 2022-08-31 DIAGNOSIS — Z48810 Encounter for surgical aftercare following surgery on the sense organs: Secondary | ICD-10-CM | POA: Diagnosis not present

## 2022-08-31 DIAGNOSIS — H401133 Primary open-angle glaucoma, bilateral, severe stage: Secondary | ICD-10-CM | POA: Diagnosis not present

## 2022-08-31 DIAGNOSIS — H04123 Dry eye syndrome of bilateral lacrimal glands: Secondary | ICD-10-CM | POA: Diagnosis not present

## 2022-08-31 DIAGNOSIS — Z961 Presence of intraocular lens: Secondary | ICD-10-CM | POA: Diagnosis not present

## 2022-08-31 DIAGNOSIS — H02403 Unspecified ptosis of bilateral eyelids: Secondary | ICD-10-CM | POA: Diagnosis not present

## 2022-08-31 DIAGNOSIS — Z888 Allergy status to other drugs, medicaments and biological substances status: Secondary | ICD-10-CM | POA: Diagnosis not present

## 2022-09-07 DIAGNOSIS — Z9689 Presence of other specified functional implants: Secondary | ICD-10-CM | POA: Diagnosis not present

## 2022-09-07 DIAGNOSIS — H02841 Edema of right upper eyelid: Secondary | ICD-10-CM | POA: Diagnosis not present

## 2022-09-07 DIAGNOSIS — Z79899 Other long term (current) drug therapy: Secondary | ICD-10-CM | POA: Diagnosis not present

## 2022-09-07 DIAGNOSIS — H02844 Edema of left upper eyelid: Secondary | ICD-10-CM | POA: Diagnosis not present

## 2022-09-07 DIAGNOSIS — H401133 Primary open-angle glaucoma, bilateral, severe stage: Secondary | ICD-10-CM | POA: Diagnosis not present

## 2022-09-07 DIAGNOSIS — H02845 Edema of left lower eyelid: Secondary | ICD-10-CM | POA: Diagnosis not present

## 2022-09-07 DIAGNOSIS — H04123 Dry eye syndrome of bilateral lacrimal glands: Secondary | ICD-10-CM | POA: Diagnosis not present

## 2022-09-07 DIAGNOSIS — H02842 Edema of right lower eyelid: Secondary | ICD-10-CM | POA: Diagnosis not present

## 2022-09-13 DIAGNOSIS — Z23 Encounter for immunization: Secondary | ICD-10-CM | POA: Diagnosis not present

## 2022-09-13 DIAGNOSIS — E1165 Type 2 diabetes mellitus with hyperglycemia: Secondary | ICD-10-CM | POA: Diagnosis not present

## 2022-09-13 DIAGNOSIS — E782 Mixed hyperlipidemia: Secondary | ICD-10-CM | POA: Diagnosis not present

## 2022-09-13 DIAGNOSIS — E1121 Type 2 diabetes mellitus with diabetic nephropathy: Secondary | ICD-10-CM | POA: Diagnosis not present

## 2022-09-13 DIAGNOSIS — E1140 Type 2 diabetes mellitus with diabetic neuropathy, unspecified: Secondary | ICD-10-CM | POA: Diagnosis not present

## 2022-09-13 DIAGNOSIS — I129 Hypertensive chronic kidney disease with stage 1 through stage 4 chronic kidney disease, or unspecified chronic kidney disease: Secondary | ICD-10-CM | POA: Diagnosis not present

## 2022-10-12 DIAGNOSIS — H04123 Dry eye syndrome of bilateral lacrimal glands: Secondary | ICD-10-CM | POA: Diagnosis not present

## 2022-10-12 DIAGNOSIS — H401133 Primary open-angle glaucoma, bilateral, severe stage: Secondary | ICD-10-CM | POA: Diagnosis not present

## 2022-10-12 DIAGNOSIS — H02843 Edema of right eye, unspecified eyelid: Secondary | ICD-10-CM | POA: Diagnosis not present

## 2022-10-12 DIAGNOSIS — H02846 Edema of left eye, unspecified eyelid: Secondary | ICD-10-CM | POA: Diagnosis not present

## 2022-10-17 NOTE — Progress Notes (Unsigned)
Cardiology Office Note:    Date:  10/18/2022   ID:  Jamaurion, Slemmer 1946/03/16, MRN [id number]  PCP:  Merrilee Seashore, [place] Cardiologist: Glenetta Hew, MD   Reason for visit: 1 year follow-up  History of Present Illness:    Jacob Berger is a 76 y.o. male with a hx of coronary artery disease status post PCI 2011, hypertension, thoracic aortic aneurysm, obesity, OSA, diabetes.  He last saw Dr. Ellyn Hack 1 year ago.  He noted brief fluttering sensations and left ankle swelling.  Today, he states he is doing fairly well.  He states with his PCI in 2011, he felt like he was having a heart attack with chest pressure.  He does mention he has had episodes of slightly left chest discomfort like clockwork around 3 to 4 AM, he wonders if related to constipation.  He feels like these happened a month ago and have subsided some.  He wonders if they improved when he took his CPAP off.  He denies exertional chest pain and shortness of breath.  He walks at least every other day 1.5 miles at a time.  Denies claudication.  He denies palpitations, LE edema, PND, lightheadedness and syncope.  He uses his CPAP regularly.      Past Medical History:  Diagnosis Date   Arthritis    "knees" (05/10/2016)   CAD S/P percutaneous coronary angioplasty 2011   a. 2011: s/p PCI to PDA with Endeavor 2.5 mm x 12 mm DES - [place] b. low-risk NST in 07/2015   Cancer of kidney [facility])    Chronic kidney disease    Daily headache    "recently" (05/10/2016)   Dyslipidemia, goal LDL below 70    Glaucoma    Gout    History of kidney stones    Hypertension, essential    Obesity, Class II, BMI 35-39.9, with comorbidity    BMI 37   OSA on CPAP    PONV (postoperative nausea and vomiting)    after colonoscopy   Prostate cancer ([place]) 2007   Rash since 04-08-16   on neck healing, right arm improving   Thoracic aortic aneurysm without rupture ([place]) - Noted on chest CT. 4.5  cm 02/15/2016   4.5 cm per dr Cyndia Bent note and [facility] ct 01-18-16   Type II diabetes mellitus [facility])     Past Surgical History:  Procedure Laterality Date   ANTERIOR CERVICAL DECOMP/DISCECTOMY FUSION N/A 11/09/2017   Procedure: Cervical four-five, Cervical five-six Anterior discectomy with fusion and plate fixation;  Surgeon: Ditty, Kevan Ny, MD;  Location: [place];  Service: Neurosurgery;  Laterality: N/A;   COLONOSCOPY W/ BIOPSIES AND POLYPECTOMY  "several"   CORONARY ANGIOPLASTY WITH STENT PLACEMENT  2011   [place]: Endeavor 2.5 mm x 12 mm DES - distal PDA ([facility])   EYE SURGERY     LAPAROSCOPIC NEPHRECTOMY Left 04/13/2016   Procedure: LAPAROSCOPIC RADICAL LEFT NEPHRECTOMY;  Surgeon: Raynelle Bring, MD;  Location: [facility];  Service: Urology;  Laterality: Left;   NM MYOVIEW ([place] HX)  6/'13, 3/'16   a. Treadmill Myoview: 10 minutes, 12 METS; no ischemia infarction, EF 65%; b. [facility]: LOW RISK. EF 57%. 10.4 METS low risk scan no ischemia. No wall motion abnormality.    NM MYOVIEW LTD  05/2017   EF 50%. Normal function. No ischemia or infarction.   PROSTATECTOMY     REFRACTIVE SURGERY Left    TOTAL KNEE ARTHROPLASTY  Left 05/27/2019   Procedure: Left Knee Arthroplasty;  Surgeon: Melrose Nakayama, MD;  Location: [facility];  Service: Orthopedics;  Laterality: Left;   TRANSTHORACIC ECHOCARDIOGRAM  05/2016   EF 60 to 65%.  No R WMA.  GR 1 DD.  Normal valves.    Current Medications: Current Meds  Medication Sig   allopurinol (ZYLOPRIM) 300 MG tablet Take 300 mg by mouth daily.    aspirin EC 81 MG tablet Take 81 mg by mouth daily.   AZOR 10-40 MG per tablet TAKE 1 TABLET BY MOUTH DAILY   bimatoprost (LUMIGAN) 0.01 % SOLN Place 1 drop into the right eye at bedtime.    Carboxymethylcellulose Sodium (REFRESH LIQUIGEL) 1 % GEL Place 1 drop into the left eye as needed (eye irritation).   diphenhydramine-acetaminophen (TYLENOL PM) 25-500 MG TABS tablet Take 1 tablet by  mouth at bedtime as needed (sleep).   Dulaglutide 1.5 MG/0.5ML SOPN Inject 1.5 mg into the skin every Thursday. In the afternoon 2pm   fenofibrate (TRICOR) 145 MG tablet Take 145 mg by mouth daily at 2 PM.    furosemide (LASIX) 80 MG tablet Take 20-40 mg by mouth daily as needed (for fluid).   LEVEMIR FLEXTOUCH 100 UNIT/ML Pen Inject 38 Units into the skin at bedtime.    Methylcellulose, Laxative, 500 MG TABS Take 500 mg by mouth daily.    nitroGLYCERIN (NITROSTAT) 0.4 MG SL tablet PLACE 1 TABLET (0.4 MG TOTAL) UNDER THE TONGUE EVERY 5 (FIVE) MINUTES AS NEEDED FOR CHEST PAIN.   NOVOLOG FLEXPEN 100 UNIT/ML FlexPen Inject 15 Units into the skin 3 (three) times daily before meals.    sildenafil (VIAGRA) 50 MG tablet Take 1 tablet (total 50 mg)  to 1.5 tablet ( total 75 mg) as needed for erectile dysfunction   timolol (TIMOPTIC) 0.5 % ophthalmic solution Place 1 drop into both eyes 2 (two) times daily. Morning and afternoon   VYTORIN 10-40 MG per tablet Take 1 tablet by mouth at bedtime.      Allergies:   Accupril [quinapril hcl], Quinapril, and Quinapril hcl   Social History   Socioeconomic History   Marital status: Married    Spouse name: Not on file   Number of children: Not on file   Years of education: Not on file   Highest education level: Not on file  Occupational History   Not on file  Tobacco Use   Smoking status: Former    Types: Cigarettes    Quit date: 06/04/1993    Years since quitting: 29.3   Smokeless tobacco: Never   Tobacco comments:    A PK WOULD LAST A WEEK  Vaping Use   Vaping Use: Never used  Substance and Sexual Activity   Alcohol use: Yes    Alcohol/week: 2.0 standard drinks of alcohol    Types: 1 Glasses of wine, 1 Cans of beer per week    Comment: rare   Drug use: No   Sexual activity: Not Currently  Other Topics Concern   Not on file  Social History Narrative   Married father of 3. Had been walking up to 2 miles every other day appetite is good his  weight for about half an hour time period, but limited now due to back pain. Occasional alcohol. No tobacco products -- he quit about 20-30 years ago.   Social Determinants of Health   Financial Resource Strain: Not on file  Food Insecurity: Not on file  Transportation Needs: Not on file  Physical Activity: Not on file  Stress: Not on file  Social Connections: Not on file     Family History: The patient's family history includes Hypertension in his son.  ROS:   Please see the history of present illness.     EKGs/Labs/Other Studies Reviewed:    EKG:  The ekg ordered today demonstrates normal sinus rhythm with sinus arrhythmia, heart rate 63.  Recent Labs: 12/21/2021: Creatinine, Ser 1.90   Recent Lipid Panel Lab Results  Component Value Date/Time   CHOL 105 05/27/2017 05:39 AM   TRIG 224 (H) 05/27/2017 05:39 AM   HDL 25 (L) 05/27/2017 05:39 AM   LDLCALC 35 05/27/2017 05:39 AM    Physical Exam:    VS:  BP 128/60   Pulse 63   Ht 6' (1.829 m)   Wt 275 lb 3.2 oz (124.8 kg)   BMI 37.32 kg/m    No data found.       Wt Readings from Last 3 Encounters:  10/18/22 275 lb 3.2 oz (124.8 kg)  09/26/21 278 lb 9.6 oz (126.4 kg)  03/29/20 283 lb (128.4 kg)     GEN:  Well nourished, well developed in no acute distress HEENT: Normal NECK: No JVD; No carotid bruits CARDIAC: RRR, no murmurs, rubs, gallops RESPIRATORY:  Clear to auscultation without rales, wheezing or rhonchi  ABDOMEN: Soft, non-tender, non-distended MUSCULOSKELETAL: No edema SKIN: Warm and dry NEUROLOGIC:  Alert and oriented PSYCHIATRIC:  Normal affect     ASSESSMENT AND PLAN   Coronary artery disease without angina -PCI in 2011 -Continue statin and aspirin therapy. -Not on a beta-blocker secondary to history of fatigue and bradycardia. -Chest discomfort sounds atypical.  Do not think ischemic testing is warranted. -Continue diabetic control, last A1c 7%. -Ok to take Viagra.  Thoracic aortic  aneurysm -CTA 12/2021: stable ascending thoracic aorta aneurysm at 45 mm, unchanged from 06/2018 -Previously seen by Dr. Cyndia Bent -threshold for surgical intervention 5.5 cm. -Continue BP control, goal systolic blood pressure 086-578. -Not on a beta-blocker secondary to history of fatigue and bradycardia.  Heart rate controlled. -Recheck CT angio of the chest aorta in February-March 2024.  Hypertension, well-controlled -Continue current medications. -Goal BP is <130/80.  Recommend DASH diet (high in vegetables, fruits, low-fat dairy products, whole grains, poultry, fish, and nuts and low in sweets, sugar-sweetened beverages, and red meats), salt restriction and increase physical activity.  Hyperlipidemia with goal LDL less than 70 -Managed by PCP.  On Tricor & Vytorin. -Discussed cholesterol lowering diets - Mediterranean diet, DASH diet, vegetarian diet, low-carbohydrate diet and avoidance of trans fats.  Discussed healthier choice substitutes.  Nuts, high-fiber foods, and fiber supplements may also improve lipids.    OSA  -Encouraged CPAP use.  Disposition - Follow-up in 1 year.    Medication Adjustments/Labs and Tests Ordered: Current medicines are reviewed at length with the patient today.  Concerns regarding medicines are outlined above.  Orders Placed This Encounter  Procedures   CT ANGIO CHEST AORTA W/CM & OR WO/CM   Basic metabolic panel   EKG 46-NGEX   No orders of the defined types were placed in this encounter.   Patient Instructions  Medication Instructions:  Your physician recommends that you continue on your current medications as directed. Please refer to the Current Medication list given to you today.  -ok to take sildenafil from a cardiac standpoint.  *If you need a refill on your cardiac medications before your next appointment, please call your pharmacy*   Lab  Work: Your physician recommends that you return for lab work in: within 2 weeks of Chest/Aorta  CTA  If you have labs (blood work) drawn today and your tests are completely normal, you will receive your results only by: [place] (if you have [place]) OR A paper copy in the mail If you have any lab test that is abnormal or we need to change your treatment, we will call you to review the results.   Testing/Procedures: Non-Cardiac CT Angiography (CTA) chest/aorta, is a special type of CT scan that uses a computer to produce multi-dimensional views of major blood vessels throughout the body. In CT angiography, a contrast material is injected through an IV to help visualize the blood vessels To be done in February 2024.  This will be done at [address] [place], [place] [address]      Follow-Up: At [facility], you and your health needs are our priority.  As part of our continuing mission to provide you with exceptional heart care, we have created designated Provider Care Teams.  These Care Teams include your primary Cardiologist (physician) and Advanced Practice Providers (APPs -  Physician Assistants and Nurse Practitioners) who all work together to provide you with the care you need, when you need it.  We recommend signing up for the patient portal called "MyChart".  Sign up information is provided on this After Visit Summary.  MyChart is used to connect with patients for Virtual Visits (Telemedicine).  Patients are able to view lab/test results, encounter notes, upcoming appointments, etc.  Non-urgent messages can be sent to your provider as well.   To learn more about what you can do with MyChart, go to [email or web address].    Your next appointment:   12 month(s)  The format for your next appointment:   In Person  Provider:   Caron Presume, PA-C or Glenetta Hew, MD   Signed, Warren Lacy, PA-C  10/18/2022 9:36 AM    [place]

## 2022-10-18 ENCOUNTER — Ambulatory Visit: Payer: Medicare Other | Attending: Physician Assistant | Admitting: Physician Assistant

## 2022-10-18 ENCOUNTER — Encounter: Payer: Self-pay | Admitting: Physician Assistant

## 2022-10-18 VITALS — BP 128/60 | HR 63 | Ht 72.0 in | Wt 275.2 lb

## 2022-10-18 DIAGNOSIS — E785 Hyperlipidemia, unspecified: Secondary | ICD-10-CM | POA: Insufficient documentation

## 2022-10-18 DIAGNOSIS — E1169 Type 2 diabetes mellitus with other specified complication: Secondary | ICD-10-CM | POA: Diagnosis not present

## 2022-10-18 DIAGNOSIS — I7121 Aneurysm of the ascending aorta, without rupture: Secondary | ICD-10-CM | POA: Insufficient documentation

## 2022-10-18 DIAGNOSIS — E669 Obesity, unspecified: Secondary | ICD-10-CM | POA: Diagnosis not present

## 2022-10-18 DIAGNOSIS — I10 Essential (primary) hypertension: Secondary | ICD-10-CM | POA: Diagnosis not present

## 2022-10-18 DIAGNOSIS — I2510 Atherosclerotic heart disease of native coronary artery without angina pectoris: Secondary | ICD-10-CM | POA: Diagnosis not present

## 2022-10-18 NOTE — Patient Instructions (Signed)
Medication Instructions:  Your physician recommends that you continue on your current medications as directed. Please refer to the Current Medication list given to you today.  -ok to take sildenafil from a cardiac standpoint.  *If you need a refill on your cardiac medications before your next appointment, please call your pharmacy*   Lab Work: Your physician recommends that you return for lab work in: within 2 weeks of Chest/Aorta CTA  If you have labs (blood work) drawn today and your tests are completely normal, you will receive your results only by: [place] (if you have MyChart) OR A paper copy in the mail If you have any lab test that is abnormal or we need to change your treatment, we will call you to review the results.   Testing/Procedures: Non-Cardiac CT Angiography (CTA) chest/aorta, is a special type of CT scan that uses a computer to produce multi-dimensional views of major blood vessels throughout the body. In CT angiography, a contrast material is injected through an IV to help visualize the blood vessels To be done in February 2024.  This will be done at [place], [place] [address]      Follow-Up: At [facility], you and your health needs are our priority.  As part of our continuing mission to provide you with exceptional heart care, we have created designated Provider Care Teams.  These Care Teams include your primary Cardiologist (physician) and Advanced Practice Providers (APPs -  Physician Assistants and Nurse Practitioners) who all work together to provide you with the care you need, when you need it.  We recommend signing up for the patient portal called "MyChart".  Sign up information is provided on this After Visit Summary.  MyChart is used to connect with patients for Virtual Visits (Telemedicine).  Patients are able to view lab/test results, encounter notes, upcoming appointments, etc.  Non-urgent messages can be sent to your  provider as well.   To learn more about what you can do with MyChart, go to [email or web address].    Your next appointment:   12 month(s)  The format for your next appointment:   In Person  Provider:   Caron Presume, PA-C or Glenetta Hew, MD

## 2022-11-27 DIAGNOSIS — E782 Mixed hyperlipidemia: Secondary | ICD-10-CM | POA: Diagnosis not present

## 2022-11-27 DIAGNOSIS — I700 Atherosclerosis of aorta: Secondary | ICD-10-CM | POA: Diagnosis not present

## 2022-11-27 DIAGNOSIS — N1831 Chronic kidney disease, stage 3a: Secondary | ICD-10-CM | POA: Diagnosis not present

## 2022-11-27 DIAGNOSIS — E1140 Type 2 diabetes mellitus with diabetic neuropathy, unspecified: Secondary | ICD-10-CM | POA: Diagnosis not present

## 2022-12-04 DIAGNOSIS — M159 Polyosteoarthritis, unspecified: Secondary | ICD-10-CM | POA: Diagnosis not present

## 2022-12-04 DIAGNOSIS — E782 Mixed hyperlipidemia: Secondary | ICD-10-CM | POA: Diagnosis not present

## 2022-12-04 DIAGNOSIS — N1831 Chronic kidney disease, stage 3a: Secondary | ICD-10-CM | POA: Diagnosis not present

## 2022-12-04 DIAGNOSIS — M549 Dorsalgia, unspecified: Secondary | ICD-10-CM | POA: Diagnosis not present

## 2022-12-04 DIAGNOSIS — E1140 Type 2 diabetes mellitus with diabetic neuropathy, unspecified: Secondary | ICD-10-CM | POA: Diagnosis not present

## 2022-12-04 DIAGNOSIS — I700 Atherosclerosis of aorta: Secondary | ICD-10-CM | POA: Diagnosis not present

## 2022-12-04 DIAGNOSIS — I712 Thoracic aortic aneurysm, without rupture, unspecified: Secondary | ICD-10-CM | POA: Diagnosis not present

## 2022-12-04 DIAGNOSIS — I119 Hypertensive heart disease without heart failure: Secondary | ICD-10-CM | POA: Diagnosis not present

## 2022-12-04 DIAGNOSIS — Z794 Long term (current) use of insulin: Secondary | ICD-10-CM | POA: Diagnosis not present

## 2022-12-04 DIAGNOSIS — N478 Other disorders of prepuce: Secondary | ICD-10-CM | POA: Diagnosis not present

## 2022-12-04 DIAGNOSIS — M1610 Unilateral primary osteoarthritis, unspecified hip: Secondary | ICD-10-CM | POA: Diagnosis not present

## 2023-01-01 ENCOUNTER — Ambulatory Visit (HOSPITAL_COMMUNITY): Admission: RE | Admit: 2023-01-01 | Payer: Medicare Other | Source: Ambulatory Visit

## 2023-03-06 DIAGNOSIS — H401133 Primary open-angle glaucoma, bilateral, severe stage: Secondary | ICD-10-CM | POA: Diagnosis not present

## 2023-03-06 DIAGNOSIS — H04123 Dry eye syndrome of bilateral lacrimal glands: Secondary | ICD-10-CM | POA: Diagnosis not present

## 2023-03-06 DIAGNOSIS — Z9689 Presence of other specified functional implants: Secondary | ICD-10-CM | POA: Diagnosis not present

## 2023-03-14 DIAGNOSIS — I10 Essential (primary) hypertension: Secondary | ICD-10-CM | POA: Diagnosis not present

## 2023-03-14 DIAGNOSIS — N1832 Chronic kidney disease, stage 3b: Secondary | ICD-10-CM | POA: Diagnosis not present

## 2023-03-14 DIAGNOSIS — I712 Thoracic aortic aneurysm, without rupture, unspecified: Secondary | ICD-10-CM | POA: Diagnosis not present

## 2023-03-14 DIAGNOSIS — I700 Atherosclerosis of aorta: Secondary | ICD-10-CM | POA: Diagnosis not present

## 2023-03-14 DIAGNOSIS — E782 Mixed hyperlipidemia: Secondary | ICD-10-CM | POA: Diagnosis not present

## 2023-03-14 DIAGNOSIS — E1165 Type 2 diabetes mellitus with hyperglycemia: Secondary | ICD-10-CM | POA: Diagnosis not present

## 2023-03-14 DIAGNOSIS — E1140 Type 2 diabetes mellitus with diabetic neuropathy, unspecified: Secondary | ICD-10-CM | POA: Diagnosis not present

## 2023-04-30 DIAGNOSIS — Z8546 Personal history of malignant neoplasm of prostate: Secondary | ICD-10-CM | POA: Diagnosis not present

## 2023-05-04 DIAGNOSIS — N5201 Erectile dysfunction due to arterial insufficiency: Secondary | ICD-10-CM | POA: Diagnosis not present

## 2023-05-04 DIAGNOSIS — Z85528 Personal history of other malignant neoplasm of kidney: Secondary | ICD-10-CM | POA: Diagnosis not present

## 2023-05-04 DIAGNOSIS — Z8546 Personal history of malignant neoplasm of prostate: Secondary | ICD-10-CM | POA: Diagnosis not present

## 2023-05-07 ENCOUNTER — Ambulatory Visit (HOSPITAL_COMMUNITY)
Admission: RE | Admit: 2023-05-07 | Discharge: 2023-05-07 | Disposition: A | Discharge status: Home / Self Care | Payer: Medicare Other | Source: Ambulatory Visit | Attending: Physician Assistant | Admitting: Physician Assistant

## 2023-05-07 DIAGNOSIS — I2510 Atherosclerotic heart disease of native coronary artery without angina pectoris: Secondary | ICD-10-CM | POA: Insufficient documentation

## 2023-05-07 DIAGNOSIS — I10 Essential (primary) hypertension: Secondary | ICD-10-CM | POA: Insufficient documentation

## 2023-05-07 DIAGNOSIS — I7121 Aneurysm of the ascending aorta, without rupture: Secondary | ICD-10-CM | POA: Insufficient documentation

## 2023-05-07 DIAGNOSIS — E1169 Type 2 diabetes mellitus with other specified complication: Secondary | ICD-10-CM | POA: Insufficient documentation

## 2023-05-07 DIAGNOSIS — E669 Obesity, unspecified: Secondary | ICD-10-CM | POA: Insufficient documentation

## 2023-05-07 DIAGNOSIS — Z794 Long term (current) use of insulin: Secondary | ICD-10-CM | POA: Diagnosis not present

## 2023-05-07 DIAGNOSIS — E785 Hyperlipidemia, unspecified: Secondary | ICD-10-CM | POA: Insufficient documentation

## 2023-05-07 DIAGNOSIS — K8020 Calculus of gallbladder without cholecystitis without obstruction: Secondary | ICD-10-CM | POA: Diagnosis not present

## 2023-05-07 DIAGNOSIS — E119 Type 2 diabetes mellitus without complications: Secondary | ICD-10-CM | POA: Diagnosis not present

## 2023-05-07 DIAGNOSIS — I708 Atherosclerosis of other arteries: Secondary | ICD-10-CM | POA: Diagnosis not present

## 2023-05-07 DIAGNOSIS — I700 Atherosclerosis of aorta: Secondary | ICD-10-CM | POA: Diagnosis not present

## 2023-05-07 MED ORDER — IOHEXOL 350 MG/ML SOLN
55.0000 mL | Freq: Once | INTRAVENOUS | Status: AC | PRN
  Administered 2023-05-07: 55 mL via INTRAVENOUS

## 2023-05-09 ENCOUNTER — Telehealth: Payer: Self-pay | Admitting: Cardiology

## 2023-05-09 ENCOUNTER — Telehealth: Payer: Self-pay

## 2023-05-09 NOTE — Telephone Encounter (Signed)
Pt returning call from today regarding test results. Please advise

## 2023-05-09 NOTE — Telephone Encounter (Addendum)
Called patient regarding results. Left message for patient to call office. ----- Message from Cannon Kettle, PA-C sent at 05/09/2023 12:25 PM EDT ----- Stable ascending thoracic aorta aneurysm at 4.6cm. Previously seen by Dr. Laneta Simmers -threshold for surgical intervention 5.5 cm. Continue BP control, goal systolic blood pressure 105-120. Repeat CT angio of the chest aorta in 1 year.

## 2023-05-09 NOTE — Telephone Encounter (Signed)
Call to patient. No answer. LM to call office

## 2023-05-11 ENCOUNTER — Telehealth: Payer: Self-pay

## 2023-05-11 NOTE — Telephone Encounter (Addendum)
Called patient regarding results. Left message for patient to call office. ----- Message from Jennifer K Lambert, PA-C sent at 05/09/2023 12:25 PM EDT ----- Stable ascending thoracic aorta aneurysm at 4.6cm. Previously seen by Dr. Bartle -threshold for surgical intervention 5.5 cm. Continue BP control, goal systolic blood pressure 105-120. Repeat CT angio of the chest aorta in 1 year.    

## 2023-05-11 NOTE — Telephone Encounter (Signed)
Patient is returning call. Requesting return call.  

## 2023-05-11 NOTE — Telephone Encounter (Signed)
Gave recommendations regarding test results and recommendations regarding BP parameters.  He states understanding.  He ask the results be sent to his PCP.  Sent through [employer]

## 2023-05-11 NOTE — Telephone Encounter (Signed)
Duplicate encounter for results.  Please see separate encounter.

## 2023-05-11 NOTE — Telephone Encounter (Signed)
Call to patient. No answer. LM to call office 

## 2023-05-15 LAB — POCT I-STAT CREATININE: Creatinine, Ser: 1.8 mg/dL — ABNORMAL HIGH (ref 0.61–1.24)

## 2023-05-29 ENCOUNTER — Telehealth: Payer: Self-pay

## 2023-05-29 NOTE — Telephone Encounter (Addendum)
Letter mailed to patient 05/29/23----- Message from Cannon Kettle sent at 05/09/2023 12:25 PM EDT ----- Stable ascending thoracic aorta aneurysm at 4.6cm. Previously seen by Dr. Laneta Simmers -threshold for surgical intervention 5.5 cm. Continue BP control, goal systolic blood pressure 105-120. Repeat CT angio of the chest aorta in 1 year.

## 2023-07-03 DIAGNOSIS — E1165 Type 2 diabetes mellitus with hyperglycemia: Secondary | ICD-10-CM | POA: Diagnosis not present

## 2023-07-03 DIAGNOSIS — I119 Hypertensive heart disease without heart failure: Secondary | ICD-10-CM | POA: Diagnosis not present

## 2023-07-03 DIAGNOSIS — E1140 Type 2 diabetes mellitus with diabetic neuropathy, unspecified: Secondary | ICD-10-CM | POA: Diagnosis not present

## 2023-07-03 DIAGNOSIS — E782 Mixed hyperlipidemia: Secondary | ICD-10-CM | POA: Diagnosis not present

## 2023-07-03 DIAGNOSIS — M159 Polyosteoarthritis, unspecified: Secondary | ICD-10-CM | POA: Diagnosis not present

## 2023-07-03 DIAGNOSIS — N1831 Chronic kidney disease, stage 3a: Secondary | ICD-10-CM | POA: Diagnosis not present

## 2023-07-03 DIAGNOSIS — Z0000 Encounter for general adult medical examination without abnormal findings: Secondary | ICD-10-CM | POA: Diagnosis not present

## 2023-07-03 DIAGNOSIS — Z23 Encounter for immunization: Secondary | ICD-10-CM | POA: Diagnosis not present

## 2023-07-03 DIAGNOSIS — Z794 Long term (current) use of insulin: Secondary | ICD-10-CM | POA: Diagnosis not present

## 2023-07-03 DIAGNOSIS — R5383 Other fatigue: Secondary | ICD-10-CM | POA: Diagnosis not present

## 2023-07-10 DIAGNOSIS — E1122 Type 2 diabetes mellitus with diabetic chronic kidney disease: Secondary | ICD-10-CM | POA: Diagnosis not present

## 2023-07-10 DIAGNOSIS — N1831 Chronic kidney disease, stage 3a: Secondary | ICD-10-CM | POA: Diagnosis not present

## 2023-07-10 DIAGNOSIS — Z23 Encounter for immunization: Secondary | ICD-10-CM | POA: Diagnosis not present

## 2023-07-10 DIAGNOSIS — I7123 Aneurysm of the descending thoracic aorta, without rupture: Secondary | ICD-10-CM | POA: Diagnosis not present

## 2023-07-10 DIAGNOSIS — M549 Dorsalgia, unspecified: Secondary | ICD-10-CM | POA: Diagnosis not present

## 2023-07-10 DIAGNOSIS — I7121 Aneurysm of the ascending aorta, without rupture: Secondary | ICD-10-CM | POA: Diagnosis not present

## 2023-07-10 DIAGNOSIS — I119 Hypertensive heart disease without heart failure: Secondary | ICD-10-CM | POA: Diagnosis not present

## 2023-07-10 DIAGNOSIS — Z794 Long term (current) use of insulin: Secondary | ICD-10-CM | POA: Diagnosis not present

## 2023-07-10 DIAGNOSIS — I10 Essential (primary) hypertension: Secondary | ICD-10-CM | POA: Diagnosis not present

## 2023-07-10 DIAGNOSIS — R519 Headache, unspecified: Secondary | ICD-10-CM | POA: Diagnosis not present

## 2023-07-10 DIAGNOSIS — E782 Mixed hyperlipidemia: Secondary | ICD-10-CM | POA: Diagnosis not present

## 2023-07-10 DIAGNOSIS — I700 Atherosclerosis of aorta: Secondary | ICD-10-CM | POA: Diagnosis not present

## 2023-07-11 ENCOUNTER — Other Ambulatory Visit: Payer: Self-pay | Admitting: Internal Medicine

## 2023-07-11 DIAGNOSIS — R519 Headache, unspecified: Secondary | ICD-10-CM

## 2023-07-16 DIAGNOSIS — I10 Essential (primary) hypertension: Secondary | ICD-10-CM | POA: Diagnosis not present

## 2023-07-16 DIAGNOSIS — N1832 Chronic kidney disease, stage 3b: Secondary | ICD-10-CM | POA: Diagnosis not present

## 2023-07-16 DIAGNOSIS — E782 Mixed hyperlipidemia: Secondary | ICD-10-CM | POA: Diagnosis not present

## 2023-07-16 DIAGNOSIS — N1831 Chronic kidney disease, stage 3a: Secondary | ICD-10-CM | POA: Diagnosis not present

## 2023-07-16 DIAGNOSIS — Z794 Long term (current) use of insulin: Secondary | ICD-10-CM | POA: Diagnosis not present

## 2023-07-16 DIAGNOSIS — E1165 Type 2 diabetes mellitus with hyperglycemia: Secondary | ICD-10-CM | POA: Diagnosis not present

## 2023-07-16 DIAGNOSIS — E1122 Type 2 diabetes mellitus with diabetic chronic kidney disease: Secondary | ICD-10-CM | POA: Diagnosis not present

## 2023-07-27 ENCOUNTER — Ambulatory Visit
Admission: RE | Admit: 2023-07-27 | Discharge: 2023-07-27 | Disposition: A | Discharge status: Home / Self Care | Payer: Medicare Other | Source: Ambulatory Visit | Attending: Internal Medicine | Admitting: Internal Medicine

## 2023-07-27 DIAGNOSIS — R519 Headache, unspecified: Secondary | ICD-10-CM

## 2023-07-27 DIAGNOSIS — M5481 Occipital neuralgia: Secondary | ICD-10-CM | POA: Diagnosis not present

## 2023-08-16 DIAGNOSIS — H401133 Primary open-angle glaucoma, bilateral, severe stage: Secondary | ICD-10-CM | POA: Diagnosis not present

## 2023-08-16 DIAGNOSIS — H04123 Dry eye syndrome of bilateral lacrimal glands: Secondary | ICD-10-CM | POA: Diagnosis not present

## 2023-08-16 DIAGNOSIS — Z9689 Presence of other specified functional implants: Secondary | ICD-10-CM | POA: Diagnosis not present

## 2023-10-22 DIAGNOSIS — E1122 Type 2 diabetes mellitus with diabetic chronic kidney disease: Secondary | ICD-10-CM | POA: Diagnosis not present

## 2023-10-22 DIAGNOSIS — E782 Mixed hyperlipidemia: Secondary | ICD-10-CM | POA: Diagnosis not present

## 2023-10-22 DIAGNOSIS — I10 Essential (primary) hypertension: Secondary | ICD-10-CM | POA: Diagnosis not present

## 2023-10-22 DIAGNOSIS — N1832 Chronic kidney disease, stage 3b: Secondary | ICD-10-CM | POA: Diagnosis not present

## 2023-10-22 DIAGNOSIS — E1165 Type 2 diabetes mellitus with hyperglycemia: Secondary | ICD-10-CM | POA: Diagnosis not present

## 2023-12-17 DIAGNOSIS — H401133 Primary open-angle glaucoma, bilateral, severe stage: Secondary | ICD-10-CM | POA: Diagnosis not present

## 2023-12-17 DIAGNOSIS — H04123 Dry eye syndrome of bilateral lacrimal glands: Secondary | ICD-10-CM | POA: Diagnosis not present

## 2023-12-17 DIAGNOSIS — Z9689 Presence of other specified functional implants: Secondary | ICD-10-CM | POA: Diagnosis not present

## 2024-01-15 DIAGNOSIS — N1831 Chronic kidney disease, stage 3a: Secondary | ICD-10-CM | POA: Diagnosis not present

## 2024-01-15 DIAGNOSIS — M1A00X0 Idiopathic chronic gout, unspecified site, without tophus (tophi): Secondary | ICD-10-CM | POA: Diagnosis not present

## 2024-01-15 DIAGNOSIS — I10 Essential (primary) hypertension: Secondary | ICD-10-CM | POA: Diagnosis not present

## 2024-01-15 DIAGNOSIS — M159 Polyosteoarthritis, unspecified: Secondary | ICD-10-CM | POA: Diagnosis not present

## 2024-01-15 DIAGNOSIS — E1122 Type 2 diabetes mellitus with diabetic chronic kidney disease: Secondary | ICD-10-CM | POA: Diagnosis not present

## 2024-01-15 DIAGNOSIS — I119 Hypertensive heart disease without heart failure: Secondary | ICD-10-CM | POA: Diagnosis not present

## 2024-01-15 DIAGNOSIS — I7123 Aneurysm of the descending thoracic aorta, without rupture: Secondary | ICD-10-CM | POA: Diagnosis not present

## 2024-01-15 DIAGNOSIS — E782 Mixed hyperlipidemia: Secondary | ICD-10-CM | POA: Diagnosis not present

## 2024-01-15 DIAGNOSIS — I7121 Aneurysm of the ascending aorta, without rupture: Secondary | ICD-10-CM | POA: Diagnosis not present

## 2024-01-15 DIAGNOSIS — I700 Atherosclerosis of aorta: Secondary | ICD-10-CM | POA: Diagnosis not present

## 2024-01-15 DIAGNOSIS — Z794 Long term (current) use of insulin: Secondary | ICD-10-CM | POA: Diagnosis not present

## 2024-01-22 DIAGNOSIS — I7123 Aneurysm of the descending thoracic aorta, without rupture: Secondary | ICD-10-CM | POA: Diagnosis not present

## 2024-01-22 DIAGNOSIS — M159 Polyosteoarthritis, unspecified: Secondary | ICD-10-CM | POA: Diagnosis not present

## 2024-01-22 DIAGNOSIS — M549 Dorsalgia, unspecified: Secondary | ICD-10-CM | POA: Diagnosis not present

## 2024-01-22 DIAGNOSIS — Z794 Long term (current) use of insulin: Secondary | ICD-10-CM | POA: Diagnosis not present

## 2024-01-22 DIAGNOSIS — E782 Mixed hyperlipidemia: Secondary | ICD-10-CM | POA: Diagnosis not present

## 2024-01-22 DIAGNOSIS — I7121 Aneurysm of the ascending aorta, without rupture: Secondary | ICD-10-CM | POA: Diagnosis not present

## 2024-01-22 DIAGNOSIS — I700 Atherosclerosis of aorta: Secondary | ICD-10-CM | POA: Diagnosis not present

## 2024-01-22 DIAGNOSIS — E1122 Type 2 diabetes mellitus with diabetic chronic kidney disease: Secondary | ICD-10-CM | POA: Diagnosis not present

## 2024-01-22 DIAGNOSIS — N1831 Chronic kidney disease, stage 3a: Secondary | ICD-10-CM | POA: Diagnosis not present

## 2024-01-22 DIAGNOSIS — I10 Essential (primary) hypertension: Secondary | ICD-10-CM | POA: Diagnosis not present

## 2024-01-22 DIAGNOSIS — I119 Hypertensive heart disease without heart failure: Secondary | ICD-10-CM | POA: Diagnosis not present

## 2024-01-30 DIAGNOSIS — E1165 Type 2 diabetes mellitus with hyperglycemia: Secondary | ICD-10-CM | POA: Diagnosis not present

## 2024-01-30 DIAGNOSIS — I2510 Atherosclerotic heart disease of native coronary artery without angina pectoris: Secondary | ICD-10-CM | POA: Diagnosis not present

## 2024-01-30 DIAGNOSIS — Z794 Long term (current) use of insulin: Secondary | ICD-10-CM | POA: Diagnosis not present

## 2024-01-30 DIAGNOSIS — I10 Essential (primary) hypertension: Secondary | ICD-10-CM | POA: Diagnosis not present

## 2024-01-30 DIAGNOSIS — E782 Mixed hyperlipidemia: Secondary | ICD-10-CM | POA: Diagnosis not present

## 2024-01-30 DIAGNOSIS — I700 Atherosclerosis of aorta: Secondary | ICD-10-CM | POA: Diagnosis not present

## 2024-01-30 DIAGNOSIS — E1122 Type 2 diabetes mellitus with diabetic chronic kidney disease: Secondary | ICD-10-CM | POA: Diagnosis not present

## 2024-02-09 DIAGNOSIS — D649 Anemia, unspecified: Secondary | ICD-10-CM | POA: Diagnosis not present

## 2024-02-09 DIAGNOSIS — R509 Fever, unspecified: Secondary | ICD-10-CM | POA: Diagnosis not present

## 2024-02-09 DIAGNOSIS — R1110 Vomiting, unspecified: Secondary | ICD-10-CM | POA: Diagnosis not present

## 2024-02-09 DIAGNOSIS — N183 Chronic kidney disease, stage 3 unspecified: Secondary | ICD-10-CM | POA: Diagnosis not present

## 2024-02-09 DIAGNOSIS — R000 Tachycardia, unspecified: Secondary | ICD-10-CM | POA: Diagnosis not present

## 2024-02-09 DIAGNOSIS — T679XXA Effect of heat and light, unspecified, initial encounter: Secondary | ICD-10-CM | POA: Diagnosis not present

## 2024-02-09 DIAGNOSIS — Z794 Long term (current) use of insulin: Secondary | ICD-10-CM | POA: Diagnosis not present

## 2024-02-09 DIAGNOSIS — E1122 Type 2 diabetes mellitus with diabetic chronic kidney disease: Secondary | ICD-10-CM | POA: Diagnosis not present

## 2024-02-09 DIAGNOSIS — R519 Headache, unspecified: Secondary | ICD-10-CM | POA: Diagnosis not present

## 2024-02-09 DIAGNOSIS — R7989 Other specified abnormal findings of blood chemistry: Secondary | ICD-10-CM | POA: Diagnosis not present

## 2024-02-13 DIAGNOSIS — Z794 Long term (current) use of insulin: Secondary | ICD-10-CM | POA: Diagnosis not present

## 2024-02-13 DIAGNOSIS — I7121 Aneurysm of the ascending aorta, without rupture: Secondary | ICD-10-CM | POA: Diagnosis not present

## 2024-02-13 DIAGNOSIS — N1831 Chronic kidney disease, stage 3a: Secondary | ICD-10-CM | POA: Diagnosis not present

## 2024-02-13 DIAGNOSIS — I7123 Aneurysm of the descending thoracic aorta, without rupture: Secondary | ICD-10-CM | POA: Diagnosis not present

## 2024-02-13 DIAGNOSIS — I119 Hypertensive heart disease without heart failure: Secondary | ICD-10-CM | POA: Diagnosis not present

## 2024-02-13 DIAGNOSIS — E1122 Type 2 diabetes mellitus with diabetic chronic kidney disease: Secondary | ICD-10-CM | POA: Diagnosis not present

## 2024-02-13 DIAGNOSIS — E782 Mixed hyperlipidemia: Secondary | ICD-10-CM | POA: Diagnosis not present

## 2024-02-13 DIAGNOSIS — R410 Disorientation, unspecified: Secondary | ICD-10-CM | POA: Diagnosis not present

## 2024-02-13 DIAGNOSIS — R5383 Other fatigue: Secondary | ICD-10-CM | POA: Diagnosis not present

## 2024-02-13 DIAGNOSIS — I700 Atherosclerosis of aorta: Secondary | ICD-10-CM | POA: Diagnosis not present

## 2024-02-14 DIAGNOSIS — R410 Disorientation, unspecified: Secondary | ICD-10-CM | POA: Diagnosis not present

## 2024-02-14 DIAGNOSIS — R5383 Other fatigue: Secondary | ICD-10-CM | POA: Diagnosis not present

## 2024-03-21 DIAGNOSIS — I2510 Atherosclerotic heart disease of native coronary artery without angina pectoris: Secondary | ICD-10-CM | POA: Diagnosis not present

## 2024-03-21 DIAGNOSIS — N1831 Chronic kidney disease, stage 3a: Secondary | ICD-10-CM | POA: Diagnosis not present

## 2024-03-28 DIAGNOSIS — Z794 Long term (current) use of insulin: Secondary | ICD-10-CM | POA: Diagnosis not present

## 2024-03-28 DIAGNOSIS — I7121 Aneurysm of the ascending aorta, without rupture: Secondary | ICD-10-CM | POA: Diagnosis not present

## 2024-03-28 DIAGNOSIS — E1122 Type 2 diabetes mellitus with diabetic chronic kidney disease: Secondary | ICD-10-CM | POA: Diagnosis not present

## 2024-03-28 DIAGNOSIS — I119 Hypertensive heart disease without heart failure: Secondary | ICD-10-CM | POA: Diagnosis not present

## 2024-03-28 DIAGNOSIS — E782 Mixed hyperlipidemia: Secondary | ICD-10-CM | POA: Diagnosis not present

## 2024-03-28 DIAGNOSIS — I700 Atherosclerosis of aorta: Secondary | ICD-10-CM | POA: Diagnosis not present

## 2024-03-28 DIAGNOSIS — I7123 Aneurysm of the descending thoracic aorta, without rupture: Secondary | ICD-10-CM | POA: Diagnosis not present

## 2024-03-28 DIAGNOSIS — N1831 Chronic kidney disease, stage 3a: Secondary | ICD-10-CM | POA: Diagnosis not present

## 2024-04-04 DIAGNOSIS — H04123 Dry eye syndrome of bilateral lacrimal glands: Secondary | ICD-10-CM | POA: Diagnosis not present

## 2024-04-04 DIAGNOSIS — H401133 Primary open-angle glaucoma, bilateral, severe stage: Secondary | ICD-10-CM | POA: Diagnosis not present

## 2024-04-04 DIAGNOSIS — Z9689 Presence of other specified functional implants: Secondary | ICD-10-CM | POA: Diagnosis not present

## 2024-04-23 DIAGNOSIS — Z85528 Personal history of other malignant neoplasm of kidney: Secondary | ICD-10-CM | POA: Diagnosis not present

## 2024-04-30 DIAGNOSIS — Z85528 Personal history of other malignant neoplasm of kidney: Secondary | ICD-10-CM | POA: Diagnosis not present

## 2024-04-30 DIAGNOSIS — Z8546 Personal history of malignant neoplasm of prostate: Secondary | ICD-10-CM | POA: Diagnosis not present

## 2024-05-01 ENCOUNTER — Other Ambulatory Visit: Payer: Self-pay | Admitting: Urology

## 2024-05-01 DIAGNOSIS — Z85528 Personal history of other malignant neoplasm of kidney: Secondary | ICD-10-CM

## 2024-05-01 DIAGNOSIS — D49511 Neoplasm of unspecified behavior of right kidney: Secondary | ICD-10-CM

## 2024-05-06 DIAGNOSIS — E1122 Type 2 diabetes mellitus with diabetic chronic kidney disease: Secondary | ICD-10-CM | POA: Diagnosis not present

## 2024-05-13 DIAGNOSIS — E782 Mixed hyperlipidemia: Secondary | ICD-10-CM | POA: Diagnosis not present

## 2024-05-13 DIAGNOSIS — N1832 Chronic kidney disease, stage 3b: Secondary | ICD-10-CM | POA: Diagnosis not present

## 2024-05-13 DIAGNOSIS — Z794 Long term (current) use of insulin: Secondary | ICD-10-CM | POA: Diagnosis not present

## 2024-05-13 DIAGNOSIS — I10 Essential (primary) hypertension: Secondary | ICD-10-CM | POA: Diagnosis not present

## 2024-05-13 DIAGNOSIS — I2510 Atherosclerotic heart disease of native coronary artery without angina pectoris: Secondary | ICD-10-CM | POA: Diagnosis not present

## 2024-05-13 DIAGNOSIS — E1165 Type 2 diabetes mellitus with hyperglycemia: Secondary | ICD-10-CM | POA: Diagnosis not present

## 2024-06-16 ENCOUNTER — Other Ambulatory Visit

## 2024-06-16 ENCOUNTER — Ambulatory Visit
Admission: RE | Admit: 2024-06-16 | Discharge: 2024-06-16 | Disposition: A | Discharge status: Home / Self Care | Source: Ambulatory Visit | Attending: Urology | Admitting: Urology

## 2024-06-16 DIAGNOSIS — Z85528 Personal history of other malignant neoplasm of kidney: Secondary | ICD-10-CM

## 2024-06-16 DIAGNOSIS — D49511 Neoplasm of unspecified behavior of right kidney: Secondary | ICD-10-CM

## 2024-06-16 DIAGNOSIS — N2889 Other specified disorders of kidney and ureter: Secondary | ICD-10-CM | POA: Diagnosis not present

## 2024-06-16 MED ORDER — GADOPICLENOL 0.5 MMOL/ML IV SOLN
10.0000 mL | Freq: Once | INTRAVENOUS | Status: AC | PRN
  Administered 2024-06-16 (×2): 10 mL via INTRAVENOUS

## 2024-07-02 ENCOUNTER — Other Ambulatory Visit: Payer: Self-pay | Admitting: Urology

## 2024-07-02 DIAGNOSIS — N2889 Other specified disorders of kidney and ureter: Secondary | ICD-10-CM

## 2024-07-03 DIAGNOSIS — I771 Stricture of artery: Secondary | ICD-10-CM | POA: Diagnosis not present

## 2024-07-03 DIAGNOSIS — I700 Atherosclerosis of aorta: Secondary | ICD-10-CM | POA: Diagnosis not present

## 2024-07-03 DIAGNOSIS — Z85528 Personal history of other malignant neoplasm of kidney: Secondary | ICD-10-CM | POA: Diagnosis not present

## 2024-07-03 DIAGNOSIS — I7121 Aneurysm of the ascending aorta, without rupture: Secondary | ICD-10-CM | POA: Diagnosis not present

## 2024-07-09 ENCOUNTER — Telehealth: Payer: Self-pay | Admitting: Cardiology

## 2024-07-09 ENCOUNTER — Ambulatory Visit
Admission: RE | Admit: 2024-07-09 | Discharge: 2024-07-09 | Disposition: A | Discharge status: Home / Self Care | Source: Ambulatory Visit | Attending: Urology | Admitting: Urology

## 2024-07-09 DIAGNOSIS — N2889 Other specified disorders of kidney and ureter: Secondary | ICD-10-CM | POA: Diagnosis not present

## 2024-07-09 NOTE — Telephone Encounter (Signed)
   Name: Jacob Berger  DOB: 01-Jan-1946  MRN: [id number]  Primary Cardiologist: Alm Clay, MD  Chart reviewed as part of pre-operative protocol coverage. Because of Jacob Berger's past medical history and time since last visit, he will require a follow-up in-office visit in order to better assess preoperative cardiovascular risk. Patient has not been seen by cardiology since 10/2022.   Pre-op covering staff: - Please schedule appointment and call patient to inform them. If patient already had an upcoming appointment within acceptable timeframe, please add pre-op clearance to the appointment notes so provider is aware. - Please contact requesting surgeon's office via preferred method (i.e, phone, fax) to inform them of need for appointment prior to surgery.   Rollo FABIENE Louder, PA-C  07/09/2024, 11:38 AM

## 2024-07-09 NOTE — Telephone Encounter (Signed)
 Left message for pt to call our office and schedule IN OFFICE preop appt.

## 2024-07-09 NOTE — Telephone Encounter (Signed)
   Pre-operative Risk Assessment    Patient Name: Jacob Berger  DOB: 07-03-1946 MRN: [id number]      Request for Surgical Clearance    Procedure:  Right Renal Biopsy, Cryoablation  Date of Surgery:  Clearance TBD                                 Surgeon:  Dr Luverne Socks Group or Practice Name:  [facility] Phone number:  [phone] Fax number:  [phone]    Type of Clearance Requested:   - Medical    Type of Anesthesia:  General    Additional requests/questions:    SignedRojelio Kays   07/09/2024, 10:26 AM

## 2024-07-09 NOTE — Consult Note (Signed)
 Chief Complaint: Patient was seen in consultation today for cryoablation of a right renal mass at the request of Borden,Lester  Referring Physician(s): Borden,Lester  History of Present Illness: Jacob Berger is a 78 y.o. male status post left nephrectomy on 04/13/2016 for multiple clear cell renal carcinomas, the largest measuring 3 cm. Margins were negative. MRI in 2018 and serial renal ultrasound studies were negative for recurrence or right renal lesions until recently this year, demonstrating a complex cystic lesion of the right kidney. MRI on 06/16/24 demonstrated an enhancing solid mass of the upper pole of the right kidney estimated at 3.6 x 2.3 x 2.0 cm in size and suspicious for renal carcinoma. There is no evidence of metastatic disease in the abdomen or renal vein tumor. Recent CT chest on 07/03/24 shows no metastatic disease and stable known aneurysmal disease of the ascending thoracic aorta, measuring about 4.5 cm in maximum diameter, which has been followed by Dr. Lucas. Jacob Berger is asymptomatic and denies abdominal pain, hematuria or urinary symptoms.   He cannot see well after prior bilateral glaucoma and cataract surgeries. He has a history of coronary artery disease and prior PCI and is followed by Dr. Anner. He has a history of CKD with baseline Cr around 1.37 and GFR 52 mL/min.  Past Medical History:  Diagnosis Date   Arthritis    knees (05/10/2016)   CAD S/P percutaneous coronary angioplasty 2011   a. 2011: s/p PCI to PDA with Endeavor 2.5 mm x 12 mm DES - [facility] b. low-risk NST in 07/2015   Cancer of kidney [facility])    Chronic kidney disease    Daily headache    recently (05/10/2016)   Dyslipidemia, goal LDL below 70    Glaucoma    Gout    History of kidney stones    Hypertension, essential    Obesity, Class II, BMI 35-39.9, with comorbidity    BMI 37   OSA on CPAP    PONV (postoperative nausea and vomiting)    after colonoscopy    Prostate cancer (HCC) 2007   Rash since 04-08-16   on neck healing, right arm improving   Thoracic aortic aneurysm without rupture (HCC) - Noted on chest CT. 4.5 cm 02/15/2016   4.5 cm per dr lucas note and [facility] ct 01-18-16   Type II diabetes mellitus [facility])     Past Surgical History:  Procedure Laterality Date   ANTERIOR CERVICAL DECOMP/DISCECTOMY FUSION N/A 11/09/2017   Procedure: Cervical four-five, Cervical five-six Anterior discectomy with fusion and plate fixation;  Surgeon: Ditty, Morene Hicks, MD;  Location: [facility] OR;  Service: Neurosurgery;  Laterality: N/A;   COLONOSCOPY W/ BIOPSIES AND POLYPECTOMY  several   CORONARY ANGIOPLASTY WITH STENT PLACEMENT  2011   [place] : Endeavor 2.5 mm x 12 mm DES - distal PDA ([facility])   EYE SURGERY     LAPAROSCOPIC NEPHRECTOMY Left 04/13/2016   Procedure: LAPAROSCOPIC RADICAL LEFT NEPHRECTOMY;  Surgeon: Gretel Ferrara, MD;  Location: [facility] ORS;  Service: Urology;  Laterality: Left;   NM MYOVIEW  ([facility] HX)  6/'13, 3/'16   a. Treadmill Myoview : 10 minutes, 12 METS; no ischemia infarction, EF 65%; b. [facility]: LOW RISK. EF 57%. 10.4 METS low risk scan no ischemia. No wall motion abnormality.    NM MYOVIEW  LTD  05/2017   EF 50%. Normal function. No ischemia or infarction.   PROSTATECTOMY     REFRACTIVE SURGERY Left  TOTAL KNEE ARTHROPLASTY Left 05/27/2019   Procedure: Left Knee Arthroplasty;  Surgeon: Sheril Coy, MD;  Location: [facility] ORS;  Service: Orthopedics;  Laterality: Left;   TRANSTHORACIC ECHOCARDIOGRAM  05/2016   EF 60 to 65%.  No R WMA.  GR 1 DD.  Normal valves.    Allergies: Accupril [quinapril hcl], Quinapril, and Quinapril hcl  Medications: Prior to Admission medications   Medication Sig Start Date End Date Taking? Authorizing Provider  allopurinol  (ZYLOPRIM ) 300 MG tablet Take 300 mg by mouth daily.     [provider]  aspirin  EC 81 MG tablet Take 81 mg by mouth daily.    [provider]  AZOR  10-40 MG per tablet TAKE 1 TABLET BY MOUTH DAILY 12/18/13   Anner Alm ORN, MD  bimatoprost (LUMIGAN) 0.01 % SOLN Place 1 drop into the right eye at bedtime.     [provider]  Carboxymethylcellulose Sodium (REFRESH LIQUIGEL) 1 % GEL Place 1 drop into the left eye as needed (eye irritation).    [provider]  diphenhydrAMINE  (BENADRYL ) 25 MG tablet Take 1 tablet (25 mg total) by mouth every 6 (six) hours as needed for itching. Patient not taking: Reported on 10/18/2022 11/12/18   Mesner, Selinda, MD  diphenhydramine -acetaminophen  (TYLENOL  PM) 25-500 MG TABS tablet Take 1 tablet by mouth at bedtime as needed (sleep).    [provider]  Dulaglutide  1.5 MG/0.5ML SOPN Inject 1.5 mg into the skin every Thursday. In the afternoon 2pm    [provider]  fenofibrate  (TRICOR ) 145 MG tablet Take 145 mg by mouth daily at 2 PM.     [provider]  furosemide  (LASIX ) 80 MG tablet Take 20-40 mg by mouth daily as needed (for fluid).    [provider]  LEVEMIR  FLEXTOUCH 100 UNIT/ML Pen Inject 38 Units into the skin at bedtime.  01/04/16   [provider]  Methylcellulose, Laxative, 500 MG TABS Take 500 mg by mouth daily.     [provider]  Multiple Vitamins-Minerals (EMERGEN-C IMMUNE PO) Take 1 tablet by mouth daily. Patient not taking: Reported on 10/18/2022    [provider]  nitroGLYCERIN  (NITROSTAT ) 0.4 MG SL tablet PLACE 1 TABLET (0.4 MG TOTAL) UNDER THE TONGUE EVERY 5 (FIVE) MINUTES AS NEEDED FOR CHEST PAIN. 01/13/21 10/18/22  Anner Alm ORN, MD  NOVOLOG  FLEXPEN 100 UNIT/ML FlexPen Inject 15 Units into the skin 3 (three) times daily before meals.  08/10/16   [provider]  WALKER COQ10/UBIQUINOL/MEGA PO Take 20 mLs by mouth 2 (two) times a week. Patient not taking: Reported on 10/18/2022    [provider]  RHOPRESSA 0.02 % SOLN Apply 0.02 drops to eye daily. Patient not taking: Reported on  10/18/2022 08/15/21   [provider]  sildenafil  (VIAGRA ) 50 MG tablet Take 1 tablet (total 50 mg)  to 1.5 tablet ( total 75 mg) as needed for erectile dysfunction 03/29/20   Anner Alm ORN, MD  timolol  (TIMOPTIC ) 0.5 % ophthalmic solution Place 1 drop into both eyes 2 (two) times daily. Morning and afternoon 10/01/17   [provider]  VYTORIN  10-40 MG per tablet Take 1 tablet by mouth at bedtime.  04/27/15   [provider]     Family History  Problem Relation Age of Onset   Hypertension Son     Social History   Socioeconomic History   Marital status: Married    Spouse name: Not on file   Number of children: Not  on file   Years of education: Not on file   Highest education level: Not on file  Occupational History   Not on file  Tobacco Use   Smoking status: Former    Current packs/day: 0.00    Types: Cigarettes    Quit date: 06/04/1993    Years since quitting: 31.1   Smokeless tobacco: Never   Tobacco comments:    A PK WOULD LAST A WEEK  Vaping Use   Vaping status: Never Used  Substance and Sexual Activity   Alcohol use: Yes    Alcohol/week: 2.0 standard drinks of alcohol    Types: 1 Glasses of wine, 1 Cans of beer per week    Comment: rare   Drug use: No   Sexual activity: Not Currently  Other Topics Concern   Not on file  Social History Narrative   Married father of 3. Had been walking up to 2 miles every other day appetite is good his weight for about half an hour time period, but limited now due to back pain. Occasional alcohol. No tobacco products -- he quit about 20-30 years ago.   Social Drivers of Corporate investment banker Strain: Low Risk  (05/06/2019)   [facility]   Overall Financial Resource Strain (CARDIA)    Difficulty of Paying Living Expenses: Not hard at all  Food Insecurity: Unknown (05/06/2019)   Received from [facility]   Hunger Vital Sign    Within the past 12 months, you worried that your  food would run out before you got the money to buy more.: Patient declined    Within the past 12 months, the food you bought just didn't last and you didn't have money to get more.: Patient declined  Transportation Needs: Unknown (05/06/2019)   Received from [facility]   PRAPARE - Transportation    Lack of Transportation (Medical): Patient declined    Lack of Transportation (Non-Medical): Patient declined  Physical Activity: Not on file  Stress: Not on file  Social Connections: Not on file    ECOG Status: 0 - Asymptomatic  Review of Systems: A 12 point ROS discussed and pertinent positives are indicated in the HPI above.  All other systems are negative.  Review of Systems  Constitutional: Negative.   Eyes:        Poor vision, left eye worse than right.  Respiratory: Negative.    Cardiovascular: Negative.   Gastrointestinal: Negative.   Genitourinary: Negative.   Musculoskeletal: Negative.   Neurological: Negative.     Vital Signs: BP (!) 156/76 (BP Location: Left Arm, Patient Position: Sitting, Cuff Size: Normal)   Pulse 64   Temp 98.2 F (36.8 C) (Oral)   Resp 18   SpO2 95%     Physical Exam Constitutional:      General: He is not in acute distress.    Appearance: He is not ill-appearing, toxic-appearing or diaphoretic.  Cardiovascular:     Rate and Rhythm: Normal rate and regular rhythm.     Heart sounds: Normal heart sounds. No murmur heard.    No friction rub. No gallop.  Pulmonary:     Effort: Pulmonary effort is normal. No respiratory distress.     Breath sounds: Normal breath sounds. No stridor. No wheezing, rhonchi or rales.  Abdominal:     General: Bowel sounds are normal. There is no distension.     Palpations: Abdomen is soft. There is no mass.  Tenderness: There is no abdominal tenderness. There is no right CVA tenderness, left CVA tenderness, guarding or rebound.  Musculoskeletal:        General: No swelling.  Skin:    General: Skin is warm  and dry.  Neurological:     General: No focal deficit present.     Mental Status: He is alert and oriented to person, place, and time.        Imaging: MR ABDOMEN WWO CONTRAST Result Date: 06/20/2024 CLINICAL DATA:  Follow-up renal cell carcinoma, status post left nephrectomy EXAM: MRI ABDOMEN WITHOUT AND WITH CONTRAST TECHNIQUE: Multiplanar multisequence MR imaging of the abdomen was performed both before and after the administration of intravenous contrast. CONTRAST:  10 mL Vueway  gadolinium contrast IV COMPARISON:  CT chest abdomen pelvis, 05/25/2021 FINDINGS: Lower chest: No acute abnormality. Hepatobiliary: No solid liver abnormality is seen. No gallstones, gallbladder wall thickening, or biliary dilatation. Pancreas: Unremarkable. No pancreatic ductal dilatation or surrounding inflammatory changes. Spleen: Normal in size without significant abnormality. Adrenals/Urinary Tract: Adrenal glands are unremarkable. Status post left nephrectomy. No suspicious soft tissue or contrast enhancement in the nephrectomy bed to suggest local recurrence. Arterially enhancing mass of the superior pole of the right kidney measuring 3.6 x 2.3 x 2.0 cm (series 12, image 43, series 18, image 28). Multiple additional simple fluid signal and hemorrhagic or proteinaceous signal right renal cysts, generally very tiny without other suspicious contrast enhancement. No obvious calculi. No hydronephrosis. Stomach/Bowel: Stomach is within normal limits. No evidence of bowel wall thickening, distention, or inflammatory changes. Vascular/Lymphatic: No significant vascular findings are present. No enlarged abdominal lymph nodes. Other: No abdominal wall hernia or abnormality. No ascites. Musculoskeletal: No acute or significant osseous findings. IMPRESSION: 1. Arterially enhancing mass of the superior pole of the right kidney measuring 3.6 x 2.3 x 2.0 cm, consistent with renal cell carcinoma. 2. Status post left nephrectomy. No  suspicious soft tissue or contrast enhancement in the nephrectomy bed to suggest local recurrence. 3. No evidence of renal vein invasion, lymphadenopathy or metastatic disease in the abdomen. Electronically Signed   By: Marolyn JONETTA Jaksch M.D.   On: 06/20/2024 07:14       Assessment and Plan:  I met with Jacob Berger in person and we reviewed the MRI dated 06/16/24. Based on my measurements, the upper pole mass of the right kidney measures approximately 3.0 x 2.8 x 2.1 cm and is mostly avidly enhancing with partially endophytic and partially exophytic components. The mass is of high likelihood to represent a renal carcinoma by imaging characteristics and is treatable in size and location with percutaneous cryoablation. Biopsy could be performed at the time of the procedure to establish a tissue diagnosis.   Given his history of left nephrectomy and underlying CKD, ablation would certainly be the least invasive means to treat this mass and most nephron sparing procedure. At it's size, there would be a roughly 10-20% likelihood of marginal recurrence after one treatment, but any recurrence could likely be retreated. I discussed details of ablation with Jacob Berger including need for general anesthesia. Given his history of CAD and PCI, I would like him to see Dr. Anner for cardiac clearance before the procedure. We will begin the authorization and scheduling process in the meantime.  Thank you for this interesting consult.  I greatly enjoyed meeting Jacob Berger and look forward to participating in their care.  A copy of this report was sent to the requesting provider on this date.  Electronically  Signed: Marcey ONEIDA Moan 07/09/2024, 9:22 AM    I spent a total of  40 Minutes  in face to face in clinical consultation, greater than 50% of which was counseling/coordinating care for cryoablation of a right renal mass.

## 2024-07-10 NOTE — Telephone Encounter (Signed)
 2nd attempt to reach the pt to schedule in office appt for preop clearance.

## 2024-07-11 NOTE — Telephone Encounter (Signed)
 3rd attempt. Left message for patient to call our office to schedule IN OFFICE Preop appt.  Will update surgeons office that our office has been unable to reach pt to schedule appt.  Pt can call our office at [phone] to schedule that appt.

## 2024-07-11 NOTE — Telephone Encounter (Signed)
 Patient returned Pre-op call.

## 2024-07-11 NOTE — Telephone Encounter (Signed)
 S/W pt and scheduled IN OFFICE preop appt 07/25/24 with Jackee Wyn Raddle., NP  Pt stated they are looking at the beginning of OCT 2025 to do the procedure.  Will update the surgeons office

## 2024-07-22 DIAGNOSIS — N1831 Chronic kidney disease, stage 3a: Secondary | ICD-10-CM | POA: Diagnosis not present

## 2024-07-22 DIAGNOSIS — E1122 Type 2 diabetes mellitus with diabetic chronic kidney disease: Secondary | ICD-10-CM | POA: Diagnosis not present

## 2024-07-22 DIAGNOSIS — M549 Dorsalgia, unspecified: Secondary | ICD-10-CM | POA: Diagnosis not present

## 2024-07-22 DIAGNOSIS — I700 Atherosclerosis of aorta: Secondary | ICD-10-CM | POA: Diagnosis not present

## 2024-07-22 DIAGNOSIS — Z794 Long term (current) use of insulin: Secondary | ICD-10-CM | POA: Diagnosis not present

## 2024-07-22 DIAGNOSIS — E782 Mixed hyperlipidemia: Secondary | ICD-10-CM | POA: Diagnosis not present

## 2024-07-22 DIAGNOSIS — I10 Essential (primary) hypertension: Secondary | ICD-10-CM | POA: Diagnosis not present

## 2024-07-22 DIAGNOSIS — I7121 Aneurysm of the ascending aorta, without rupture: Secondary | ICD-10-CM | POA: Diagnosis not present

## 2024-07-22 DIAGNOSIS — I7123 Aneurysm of the descending thoracic aorta, without rupture: Secondary | ICD-10-CM | POA: Diagnosis not present

## 2024-07-22 DIAGNOSIS — M1A00X0 Idiopathic chronic gout, unspecified site, without tophus (tophi): Secondary | ICD-10-CM | POA: Diagnosis not present

## 2024-07-22 DIAGNOSIS — I119 Hypertensive heart disease without heart failure: Secondary | ICD-10-CM | POA: Diagnosis not present

## 2024-07-22 NOTE — Telephone Encounter (Signed)
 I sent a message to the [facility] clinic pool. He is a pt of Dr Alena and is seeking a refill. -Chiquita

## 2024-07-24 ENCOUNTER — Other Ambulatory Visit (HOSPITAL_COMMUNITY): Payer: Self-pay | Admitting: Interventional Radiology

## 2024-07-24 DIAGNOSIS — N2889 Other specified disorders of kidney and ureter: Secondary | ICD-10-CM

## 2024-07-24 NOTE — Progress Notes (Addendum)
 Cardiology Office Note    Patient Name: Jacob Berger Date of Encounter: 07/24/2024  Primary Care Provider:  Verdia Lombard, MD Primary Cardiologist:  Alm Clay, MD Primary Electrophysiologist: None   Past Medical History    Past Medical History:  Diagnosis Date   Arthritis    knees (05/10/2016)   CAD S/P percutaneous coronary angioplasty 2011   a. 2011: s/p PCI to PDA with Endeavor 2.5 mm x 12 mm DES - [facility] b. low-risk NST in 07/2015   Cancer of kidney [facility])    Chronic kidney disease    Daily headache    recently (05/10/2016)   Dyslipidemia, goal LDL below 70    Glaucoma    Gout    History of kidney stones    Hypertension, essential    Obesity, Class II, BMI 35-39.9, with comorbidity    BMI 37   OSA on CPAP    PONV (postoperative nausea and vomiting)    after colonoscopy   Prostate cancer (HCC) 2007   Rash since 04-08-16   on neck healing, right arm improving   Thoracic aortic aneurysm without rupture (HCC) - Noted on chest CT. 4.5 cm 02/15/2016   4.5 cm per dr lucas note and [facility] ct 01-18-16   Type II diabetes mellitus (HCC)     History of Present Illness  Jacob Berger is a 78 y.o. male with a PMH of CAD s/p PCI to RCA in 2011, renal CA s/p left nephrectomy, CKD stage IIIb, HLD, HTN, OSA (on CPAP), prostate CA thoracic aortic aneurysm, DM type II who presents today for preoperative clearance.  Jacob Berger has been followed by Dr. Clay since 2014 and was previously followed by Dr. Morgan for management of CAD.  Jacob Berger has a history of PCI to the PDA with DES that occurred in 2011 while in [place] .  He was diagnosed with a thoracic aortic aneurysm and 2019 which is currently followed by Dr. Sherrine.  Myoview 's in 2018 and 2020 that were nonischemic.  He completed a CT of the chest in 2024 that showed stable dilation of ascending aneurysm with plan to repeat in 1 year.  He was seen by his PCP on 05/13/2024 and blood  pressure A1c above goal at 7.5.  He was last seen in 2023 by Delon Holts, PA and reported episodes of chest pain that occurred in the middle of the evening upon waking.  Jacob Berger presents today for overdue follow-up and preoperative clearance. He experiences ongoing episodes of slight left chest discomfort, consistently occurring around 3 to 4 AM, despite reducing his intake of Tylenol  PM from two to one tablet. This discomfort is not associated with physical activity but occurs at rest, particularly in the early morning hours. No dizziness, daytime palpitations, or chest pain during physical activity. He reports chest discomfort and palpitations occurring in the early morning at rest.  He has a history of coronary artery disease with prior stent placement and has undergone PCI. He recalls an episode of chest pressure resembling a heart attack. He has had both treadmill and chemical stress tests in the past, with the last one being in 2020, and reports doing well with the chemical stress test. He has a history of kidney issues, including a prior kidney removal. A recent sonogram identified a spot on his right kidney, prompting the upcoming biopsy. The spot was found during an MRI, and subsequent imaging was ordered to ensure it had not spread. He  takes Vytorin  and Tricor  for cholesterol management and uses Lasix  as needed for fluid management, typically taking a quarter of an 80 mg pill daily. He avoids medications unless necessary, preferring to minimize intake. Patient denies palpitations, dyspnea, PND, orthopnea, nausea, vomiting, dizziness, syncope, edema, weight gain, or early satiety.  Discussed the use of AI scribe software for clinical note transcription with the patient, who gave verbal consent to proceed.  History of Present Illness   Review of Systems  Please see the history of present illness.    All other systems reviewed and are otherwise negative except as noted above.  Physical  Exam    Wt Readings from Last 3 Encounters:  10/18/22 275 lb 3.2 oz (124.8 kg)  09/26/21 278 lb 9.6 oz (126.4 kg)  03/29/20 283 lb (128.4 kg)   CD:Uyzmz were no vitals filed for this visit.,There is no height or weight on file to calculate BMI. GEN: Well nourished, well developed in no acute distress Neck: No JVD; No carotid bruits Pulmonary: Clear to auscultation without rales, wheezing or rhonchi  Cardiovascular: Normal rate. Regular rhythm. Normal S1. Normal S2.   Murmurs: There is no murmur.  ABDOMEN: Soft, non-tender, non-distended EXTREMITIES: +1 edema in left lower leg and trace in right  EKG/LABS/ Recent Cardiac Studies   ECG personally reviewed by me today -sinus rhythm with rate of 60 bpm and new TWI in leads II and III aVF V3 V4 V5 V6  Risk Assessment/Calculations:          Lab Results  Component Value Date   WBC 8.5 05/23/2019   HGB 12.4 (L) 05/23/2019   HCT 40.0 05/23/2019   MCV 90.9 05/23/2019   PLT 216 05/23/2019   Lab Results  Component Value Date   CREATININE 1.80 (H) 05/07/2023   BUN 29 (H) 05/23/2019   NA 140 05/23/2019   K 4.0 05/23/2019   CL 105 05/23/2019   CO2 22 05/23/2019   Lab Results  Component Value Date   CHOL 105 05/27/2017   HDL 25 (L) 05/27/2017   LDLCALC 35 05/27/2017   TRIG 224 (H) 05/27/2017   CHOLHDL 4.2 05/27/2017    Lab Results  Component Value Date   HGBA1C 7.4 (H) 05/23/2019   Assessment & Plan   Assessment & Plan  1.  Preoperative clearance: - Patient's RCRI score is 6.6%  The patient affirms he has been doing well without any new cardiac symptoms. They are able to achieve 7 METS without cardiac limitations. Therefore, based on ACC/AHA guidelines, the patient would be at acceptable risk for the planned procedure without further cardiovascular testing. The patient was advised that if he develops new symptoms prior to surgery to contact our office to arrange for a follow-up visit, and he verbalized understanding.   Moderate risk for renal biopsy due to heart disease and abnormal EKG. Functional assessment indicates capability for daily activities. -Patient completed an Echo and stress test that showed no acute changes or abnormalities. - Patient is cleared for biopsy with no further cardiac work up required.    2.  History of CAD: - Previous history of PCI/DES to RCA in 2011 last ischemic evaluation completed in 2020 that was nonischemic. - Today patient reports intermittent episodes of chest pain that occurs at night but denies chest pain with activity.  He also was noted to have EKG changes with T wave abnormalities in inferior and anterior leads. - He will complete a Lexiscan  stress test to rule out possible ischemia -  Patient advised to use as needed nitroglycerin  -Continue current GDMT with ASA 81 mg, Vytorin  10-40 mg, Tricor  145 mg  3.  Essential hypertension: - Patient's blood pressure today is stable at 132/87 - Continue Azor  10-40 mg  4.  Hyperlipidemia: - Patient's LDL cholesterol is at goal at 58 - Continue Vytorin  10-40 mg, Tricor  145 mg  5.  Ascending aortic aneurysm: -Currently followed by Dr. Sherrine with CVTS - Completed CT angio of the chest on 05/2023 that showed stable aneurysm measuring 4.0 cm. - Repeat surveillance CT of the chest   Disposition: Follow-up with Alm Clay, MD or APP in 12 months Informed Consent   Shared Decision Making/Informed Consent The risks [chest pain, shortness of breath, cardiac arrhythmias, dizziness, blood pressure fluctuations, myocardial infarction, stroke/transient ischemic attack, nausea, vomiting, allergic reaction, radiation exposure, metallic taste sensation and life-threatening complications (estimated to be 1 in 10,000)], benefits (risk stratification, diagnosing coronary artery disease, treatment guidance) and alternatives of a nuclear stress test were discussed in detail with Jacob Berger and he agrees to proceed.      Signed, Wyn Raddle,  Jackee Shove, NP 07/24/2024, 6:37 PM [facility]

## 2024-07-25 ENCOUNTER — Encounter: Payer: Self-pay | Admitting: Nurse Practitioner

## 2024-07-25 ENCOUNTER — Ambulatory Visit: Attending: Nurse Practitioner | Admitting: Nurse Practitioner

## 2024-07-25 VITALS — BP 132/84 | HR 60 | Ht 72.0 in | Wt 274.4 lb

## 2024-07-25 DIAGNOSIS — I10 Essential (primary) hypertension: Secondary | ICD-10-CM | POA: Diagnosis not present

## 2024-07-25 DIAGNOSIS — E785 Hyperlipidemia, unspecified: Secondary | ICD-10-CM | POA: Insufficient documentation

## 2024-07-25 DIAGNOSIS — R9431 Abnormal electrocardiogram [ECG] [EKG]: Secondary | ICD-10-CM | POA: Insufficient documentation

## 2024-07-25 DIAGNOSIS — Z01810 Encounter for preprocedural cardiovascular examination: Secondary | ICD-10-CM | POA: Insufficient documentation

## 2024-07-25 DIAGNOSIS — I2510 Atherosclerotic heart disease of native coronary artery without angina pectoris: Secondary | ICD-10-CM | POA: Diagnosis not present

## 2024-07-25 DIAGNOSIS — E1169 Type 2 diabetes mellitus with other specified complication: Secondary | ICD-10-CM | POA: Insufficient documentation

## 2024-07-25 DIAGNOSIS — I7121 Aneurysm of the ascending aorta, without rupture: Secondary | ICD-10-CM | POA: Diagnosis not present

## 2024-07-25 MED ORDER — NITROGLYCERIN 0.4 MG SL SUBL: 0.4000 mg | SUBLINGUAL_TABLET | SUBLINGUAL | 3 refills | Status: AC | PRN

## 2024-07-25 NOTE — Patient Instructions (Signed)
 Medication Instructions:  Your physician recommends that you continue on your current medications as directed. Please refer to the Current Medication list given to you today. *If you need a refill on your cardiac medications before your next appointment, please call your pharmacy*  Lab Work: None ordered If you have labs (blood work) drawn today and your tests are completely normal, you will receive your results only by: MyChart Message (if you have MyChart) OR A paper copy in the mail If you have any lab test that is abnormal or we need to change your treatment, we will call you to review the results.  Testing/Procedures: Your physician has requested that you have a lexiscan  myoview . For further information please visit [email or web address] Please follow instruction sheet, as given.  Follow-Up: At [facility], you and your health needs are our priority.  As part of our continuing mission to provide you with exceptional heart care, our providers are all part of one team.  This team includes your primary Cardiologist (physician) and Advanced Practice Providers or APPs (Physician Assistants and Nurse Practitioners) who all work together to provide you with the care you need, when you need it.  Your next appointment:   12 month(s)  Provider:   Alm Clay, MD    We recommend signing up for the patient portal called MyChart.  Sign up information is provided on this After Visit Summary.  MyChart is used to connect with patients for Virtual Visits (Telemedicine).  Patients are able to view lab/test results, encounter notes, upcoming appointments, etc.  Non-urgent messages can be sent to your provider as well.   To learn more about what you can do with MyChart, go to [email or web address].   Other Instructions

## 2024-07-28 ENCOUNTER — Encounter (HOSPITAL_COMMUNITY): Payer: Self-pay | Admitting: *Deleted

## 2024-07-29 DIAGNOSIS — M1A00X0 Idiopathic chronic gout, unspecified site, without tophus (tophi): Secondary | ICD-10-CM | POA: Diagnosis not present

## 2024-07-29 DIAGNOSIS — Z23 Encounter for immunization: Secondary | ICD-10-CM | POA: Diagnosis not present

## 2024-07-29 DIAGNOSIS — M159 Polyosteoarthritis, unspecified: Secondary | ICD-10-CM | POA: Diagnosis not present

## 2024-07-29 DIAGNOSIS — E782 Mixed hyperlipidemia: Secondary | ICD-10-CM | POA: Diagnosis not present

## 2024-07-29 DIAGNOSIS — N1831 Chronic kidney disease, stage 3a: Secondary | ICD-10-CM | POA: Diagnosis not present

## 2024-07-29 DIAGNOSIS — E1122 Type 2 diabetes mellitus with diabetic chronic kidney disease: Secondary | ICD-10-CM | POA: Diagnosis not present

## 2024-07-29 DIAGNOSIS — I7121 Aneurysm of the ascending aorta, without rupture: Secondary | ICD-10-CM | POA: Diagnosis not present

## 2024-07-29 DIAGNOSIS — Z794 Long term (current) use of insulin: Secondary | ICD-10-CM | POA: Diagnosis not present

## 2024-07-29 DIAGNOSIS — Z0000 Encounter for general adult medical examination without abnormal findings: Secondary | ICD-10-CM | POA: Diagnosis not present

## 2024-07-29 DIAGNOSIS — I119 Hypertensive heart disease without heart failure: Secondary | ICD-10-CM | POA: Diagnosis not present

## 2024-07-29 DIAGNOSIS — I7123 Aneurysm of the descending thoracic aorta, without rupture: Secondary | ICD-10-CM | POA: Diagnosis not present

## 2024-07-29 DIAGNOSIS — R809 Proteinuria, unspecified: Secondary | ICD-10-CM | POA: Diagnosis not present

## 2024-07-30 ENCOUNTER — Other Ambulatory Visit: Payer: Self-pay | Admitting: Nurse Practitioner

## 2024-07-30 DIAGNOSIS — R9431 Abnormal electrocardiogram [ECG] [EKG]: Secondary | ICD-10-CM

## 2024-08-04 ENCOUNTER — Ambulatory Visit (HOSPITAL_COMMUNITY)
Admission: RE | Admit: 2024-08-04 | Source: Ambulatory Visit | Attending: Nurse Practitioner | Admitting: Nurse Practitioner

## 2024-08-06 ENCOUNTER — Other Ambulatory Visit: Payer: Self-pay | Admitting: Radiology

## 2024-08-06 DIAGNOSIS — N2889 Other specified disorders of kidney and ureter: Secondary | ICD-10-CM

## 2024-08-06 NOTE — Progress Notes (Signed)
 Spoke with IR team. Requested orders needed for preop.

## 2024-08-07 ENCOUNTER — Ambulatory Visit (HOSPITAL_COMMUNITY)
Admission: RE | Admit: 2024-08-07 | Discharge: 2024-08-07 | Disposition: A | Discharge status: Home / Self Care | Source: Ambulatory Visit | Attending: Cardiology | Admitting: Cardiology

## 2024-08-07 DIAGNOSIS — R9431 Abnormal electrocardiogram [ECG] [EKG]: Secondary | ICD-10-CM | POA: Diagnosis not present

## 2024-08-07 DIAGNOSIS — I700 Atherosclerosis of aorta: Secondary | ICD-10-CM | POA: Diagnosis not present

## 2024-08-07 LAB — MYOCARDIAL PERFUSION IMAGING
LV dias vol: 130 mL (ref 62–150)
LV sys vol: 58 mL (ref 4.2–5.8)
Nuc Stress EF: 55 %
Peak HR: 83 {beats}/min
Rest HR: 57 {beats}/min
Rest Nuclear Isotope Dose: 12.2 mCi
SDS: 0
SRS: 0
SSS: 0
ST Depression (mm): 0 mm
Stress Nuclear Isotope Dose: 38.7 mCi
TID: 1.19

## 2024-08-07 MED ORDER — TECHNETIUM TC 99M TETROFOSMIN IV KIT
12.2000 | PACK | Freq: Once | INTRAVENOUS | Status: AC | PRN
  Administered 2024-08-07: 12.2 via INTRAVENOUS

## 2024-08-07 MED ORDER — REGADENOSON 0.4 MG/5ML IV SOLN
0.4000 mg | Freq: Once | INTRAVENOUS | Status: AC
Start: 2024-08-07 — End: 2024-08-07
  Administered 2024-08-07: 0.4 mg via INTRAVENOUS

## 2024-08-07 MED ORDER — REGADENOSON 0.4 MG/5ML IV SOLN
INTRAVENOUS | Status: AC
  Filled 2024-08-07: qty 5

## 2024-08-07 MED ORDER — TECHNETIUM TC 99M TETROFOSMIN IV KIT
38.7000 | PACK | Freq: Once | INTRAVENOUS | Status: AC | PRN
  Administered 2024-08-07: 38.7 via INTRAVENOUS

## 2024-08-08 ENCOUNTER — Ambulatory Visit: Payer: Self-pay | Admitting: Nurse Practitioner

## 2024-08-12 ENCOUNTER — Ambulatory Visit (HOSPITAL_COMMUNITY): Payer: Self-pay | Admitting: Nurse Practitioner

## 2024-08-12 NOTE — Patient Instructions (Signed)
 SURGICAL WAITING ROOM VISITATION  Patients having surgery or a procedure may have no more than 2 support people in the waiting area - these visitors may rotate.    Children under the age of 71 must have an adult with them who is not the patient.  Visitors with respiratory illnesses are discouraged from visiting and should remain at home.  If the patient needs to stay at the hospital during part of their recovery, the visitor guidelines for inpatient rooms apply. Pre-op nurse will coordinate an appropriate time for 1 support person to accompany patient in pre-op.  This support person may not rotate.    Please refer to the [facility] website for the visitor guidelines for Inpatients (after your surgery is over and you are in a regular room).       Your procedure is scheduled on: 08-20-24   Report to [facility] Main Entrance    Report to admitting at       10:00 AM   Call this number if you have problems the morning of surgery [phone]   Do not eat food  or drink liquids  :After Midnight. Except sips of water  with meds                If you have questions, please contact your surgeon's office.   FOLLOW  ANY ADDITIONAL PRE OP INSTRUCTIONS YOU RECEIVED FROM YOUR SURGEON'S OFFICE!!!     Oral Hygiene is also important to reduce your risk of infection.                                    Remember - BRUSH YOUR TEETH THE MORNING OF SURGERY WITH YOUR REGULAR TOOTHPASTE  DENTURES WILL BE REMOVED PRIOR TO SURGERY PLEASE DO NOT APPLY Poly grip OR ADHESIVES!!!   Do NOT smoke after Midnight   Stop all vitamins and herbal supplements 7 days before surgery.   Take these medicines the morning of surgery with A SIP OF WATER : Eye drops as usual, allopurinol , Vytorin , fenofibrate   DO NOT TAKE ANY ORAL DIABETIC MEDICATIONS DAY OF YOUR SURGERY  How to Manage Your Diabetes Before and After Surgery  Why is it important to control my blood sugar before and after  surgery? Improving blood sugar levels before and after surgery helps healing and can limit problems. A way of improving blood sugar control is eating a healthy diet by:  Eating less sugar and carbohydrates  Increasing activity/exercise  Talking with your doctor about reaching your blood sugar goals High blood sugars (greater than 180 mg/dL) can raise your risk of infections and slow your recovery, so you will need to focus on controlling your diabetes during the weeks before surgery. Make sure that the doctor who takes care of your diabetes knows about your planned surgery including the date and location.  How do I manage my blood sugar before surgery? Check your blood sugar at least 4 times a day, starting 2 days before surgery, to make sure that the level is not too high or low. Check your blood sugar the morning of your surgery when you wake up and every 2 hours until you get to the Short Stay unit. If your blood sugar is less than 70 mg/dL, you will need to treat for low blood sugar: Do not take insulin . Treat a low blood sugar (less than 70 mg/dL) with  cup of clear juice (cranberry or apple),  4 glucose tablets, OR glucose gel. Recheck blood sugar in 15 minutes after treatment (to make sure it is greater than 70 mg/dL). If your blood sugar is not greater than 70 mg/dL on recheck, call [phone] for further instructions. Report your blood sugar to the short stay nurse when you get to Short Stay.  If you are admitted to the hospital after surgery: Your blood sugar will be checked by the staff and you will probably be given insulin  after surgery (instead of oral diabetes medicines) to make sure you have good blood sugar levels. The goal for blood sugar control after surgery is 80-180 mg/dL.   WHAT DO I DO ABOUT MY DIABETES MEDICATION?  Do not take oral diabetes medicines (pills) the morning of surgery.  THE NIGHT BEFORE SURGERY, take 50% of your normal dose of Lantus     insulin .       THE MORNING OF SURGERY, take 0  units of   insulin .  DO NOT TAKE THE FOLLOWING 7 DAYS PRIOR TO SURGERY: Ozempic, Wegovy, Rybelsus (Semaglutide), Byetta (exenatide), Bydureon (exenatide ER), Victoza, Saxenda (liraglutide), or Trulicity  (dulaglutide ) Mounjaro (Tirzepatide) Adlyxin (Lixisenatide), Polyethylene Glycol Loxenatide.  If your CBG is greater than 220 mg/dL, you may take  of your sliding scale  (correction) dose of insulin .   Bring CPAP mask and tubing day of surgery.                              You may not have any metal on your body including hair pins, jewelry, and body piercing             Do not wear  lotions, powders, perfumes/cologne, or deodorant                Men may shave face and neck.   Do not bring valuables to the hospital. [facility] IS NOT             RESPONSIBLE   FOR VALUABLES.   Contacts, glasses, dentures or bridgework may not be worn into surgery.   Bring small overnight bag day of surgery.   DO NOT BRING YOUR HOME MEDICATIONS TO THE HOSPITAL. PHARMACY WILL DISPENSE MEDICATIONS LISTED ON YOUR MEDICATION LIST TO YOU DURING YOUR ADMISSION IN THE HOSPITAL!    Patients discharged on the day of surgery will not be allowed to drive home.  Someone NEEDS to stay with you for the first 24 hours after anesthesia.   Special Instructions: Bring a copy of your healthcare power of attorney and living will documents the day of surgery if you haven't scanned them before.              Please read over the following fact sheets you were given: IF YOU HAVE QUESTIONS ABOUT YOUR PRE-OP INSTRUCTIONS PLEASE CALL [phone].    If you test positive for Covid or have been in contact with anyone that has tested positive in the last 10 days please notify you surgeon.    [facility] - Preparing for Surgery Before surgery, you can play an important role.  Because skin is not sterile, your skin needs to be as free of germs as possible.  You can reduce the  number of germs on your skin by washing with CHG (chlorahexidine gluconate) soap before surgery.  CHG is an antiseptic cleaner which kills germs and bonds with the skin to continue killing germs even after washing. Please DO NOT use if  you have an allergy to CHG or antibacterial soaps.  If your skin becomes reddened/irritated stop using the CHG and inform your nurse when you arrive at Short Stay. Do not shave (including legs and underarms) for at least 48 hours prior to the first CHG shower.  You may shave your face/neck.  Please follow these instructions carefully:  1.  Shower with CHG Soap the night before surgery ONLY (DO NOT USE THE SOAP THE MORNING OF SURGERY).  2.  If you choose to wash your hair, wash your hair first as usual with your normal  shampoo.  3.  After you shampoo, rinse your hair and body thoroughly to remove the shampoo.                             4.  Use CHG as you would any other liquid soap.  You can apply chg directly to the skin and wash.  Gently with a scrungie or clean washcloth.  5.  Apply the CHG Soap to your body ONLY FROM THE NECK DOWN.   Do  not use on face/ open                           Wound or open sores. Avoid contact with eyes, ears mouth and genitals (private parts).                       Wash face,  Genitals (private parts) with your normal soap.             6.  Wash thoroughly, paying special attention to the area where your  surgery  will be performed.  7.  Thoroughly rinse your body with warm water  from the neck down.  8.  DO NOT shower/wash with your normal soap after using and rinsing off the CHG Soap.                9.  Pat yourself dry with a clean towel.            10.  Wear clean pajamas.            11.  Place clean sheets on your bed the night of your first shower and do not  sleep with pets. Day of Surgery : Do not apply any CHG, lotions/deodorants the morning of surgery.  Please wear clean clothes to the hospital/surgery center.  FAILURE TO  FOLLOW THESE INSTRUCTIONS MAY RESULT IN THE CANCELLATION OF YOUR SURGERY  PATIENT SIGNATURE_________________________________  NURSE SIGNATURE__________________________________  ________________________________________________________________________

## 2024-08-12 NOTE — Progress Notes (Addendum)
 PCP - Verdia Lombard, MD  Cardiologist - Alm Clay, MD  ARNETTA 07-24-24 epic Clearance Wyn Manus, NP   PPM/ICD -  Device Orders -  Rep Notified -   Chest x-ray -  EKG - 07-25-24 epic Stress Test - 08-07-24 epic ECHO - 2017 epic Cardiac Cath -  HgbA1c- 07-22-24 CE 7.4  Sleep Study -  CPAP -   Fasting Blood Sugar -  Checks Blood Sugar _____ times a day  Blood Thinner Instructions: Aspirin  Instructions:  ERAS Protcol - PRE-SURGERY     COVID vaccine -  Activity-- Anesthesia review: DM2, OSA cpap, CAD / Stent, CKD, Thoracic aortic aneurysm  Patient denies shortness of breath, fever, cough and chest pain at PAT appointment   All instructions explained to the patient, with a verbal understanding of the material. Patient agrees to go over the instructions while at home for a better understanding. Patient also instructed to self quarantine after being tested for COVID-19. The opportunity to ask questions was provided.

## 2024-08-13 ENCOUNTER — Ambulatory Visit (HOSPITAL_COMMUNITY)
Admission: RE | Admit: 2024-08-13 | Discharge: 2024-08-13 | Disposition: A | Discharge status: Home / Self Care | Source: Ambulatory Visit | Attending: Radiology | Admitting: Radiology

## 2024-08-13 ENCOUNTER — Other Ambulatory Visit: Payer: Self-pay

## 2024-08-13 ENCOUNTER — Encounter (HOSPITAL_COMMUNITY): Payer: Self-pay

## 2024-08-13 ENCOUNTER — Telehealth: Payer: Self-pay | Admitting: Cardiology

## 2024-08-13 ENCOUNTER — Encounter (HOSPITAL_COMMUNITY)
Admission: RE | Admit: 2024-08-13 | Discharge: 2024-08-13 | Disposition: A | Source: Ambulatory Visit | Attending: Interventional Radiology

## 2024-08-13 VITALS — BP 154/76 | HR 58 | Temp 97.9°F | Resp 16 | Ht 72.0 in | Wt 271.0 lb

## 2024-08-13 DIAGNOSIS — Z01810 Encounter for preprocedural cardiovascular examination: Secondary | ICD-10-CM

## 2024-08-13 DIAGNOSIS — E119 Type 2 diabetes mellitus without complications: Secondary | ICD-10-CM | POA: Insufficient documentation

## 2024-08-13 DIAGNOSIS — I771 Stricture of artery: Secondary | ICD-10-CM | POA: Diagnosis not present

## 2024-08-13 DIAGNOSIS — N2889 Other specified disorders of kidney and ureter: Secondary | ICD-10-CM | POA: Diagnosis not present

## 2024-08-13 DIAGNOSIS — Z794 Long term (current) use of insulin: Secondary | ICD-10-CM | POA: Insufficient documentation

## 2024-08-13 DIAGNOSIS — R9431 Abnormal electrocardiogram [ECG] [EKG]: Secondary | ICD-10-CM

## 2024-08-13 DIAGNOSIS — I2510 Atherosclerotic heart disease of native coronary artery without angina pectoris: Secondary | ICD-10-CM

## 2024-08-13 DIAGNOSIS — I700 Atherosclerosis of aorta: Secondary | ICD-10-CM | POA: Diagnosis not present

## 2024-08-13 DIAGNOSIS — R9439 Abnormal result of other cardiovascular function study: Secondary | ICD-10-CM

## 2024-08-13 HISTORY — DX: Cardiac murmur, unspecified: R01.1

## 2024-08-13 LAB — BASIC METABOLIC PANEL WITH GFR
Anion gap: 12 (ref 5–15)
BUN: 43 mg/dL — ABNORMAL HIGH (ref 8–23)
CO2: 22 mmol/L (ref 22–32)
Calcium: 10 mg/dL (ref 8.9–10.3)
Chloride: 104 mmol/L (ref 98–111)
Creatinine, Ser: 2.03 mg/dL — ABNORMAL HIGH (ref 0.61–1.24)
GFR, Estimated: 33 mL/min — ABNORMAL LOW (ref >60)
Glucose, Bld: 108 mg/dL — ABNORMAL HIGH (ref 70–99)
Potassium: 4.1 mmol/L (ref 3.5–5.1)
Sodium: 138 mmol/L (ref 135–145)

## 2024-08-13 LAB — CBC WITH DIFFERENTIAL/PLATELET
Abs Immature Granulocytes: 0.02 K/uL (ref 0.00–0.07)
Basophils Absolute: 0.1 K/uL (ref 0.0–0.1)
Basophils Relative: 1 %
Eosinophils Absolute: 0.1 K/uL (ref 0.0–0.5)
Eosinophils Relative: 2 %
HCT: 39.7 % (ref 39.0–52.0)
Hemoglobin: 12.3 g/dL — ABNORMAL LOW (ref 13.0–17.0)
Immature Granulocytes: 0 %
Lymphocytes Relative: 28 %
Lymphs Abs: 1.9 K/uL (ref 0.7–4.0)
MCH: 28.3 pg (ref 26.0–34.0)
MCHC: 31 g/dL (ref 30.0–36.0)
MCV: 91.5 fL (ref 80.0–100.0)
Monocytes Absolute: 0.4 K/uL (ref 0.1–1.0)
Monocytes Relative: 6 %
Neutro Abs: 4.3 K/uL (ref 1.7–7.7)
Neutrophils Relative %: 63 %
Platelets: 195 K/uL (ref 150–400)
RBC: 4.34 MIL/uL (ref 4.22–5.81)
RDW: 14.7 % (ref 11.5–15.5)
WBC: 6.8 K/uL (ref 4.0–10.5)
nRBC: 0 % (ref 0.0–0.2)

## 2024-08-13 LAB — PROTIME-INR
INR: 1 (ref 0.8–1.2)
Prothrombin Time: 14.2 s (ref 11.4–15.2)

## 2024-08-13 LAB — GLUCOSE, CAPILLARY: Glucose-Capillary: 98 mg/dL (ref 70–99)

## 2024-08-13 NOTE — Telephone Encounter (Signed)
 Pt returning call to nurse for results. Pt states to call him back on Mobile number

## 2024-08-13 NOTE — Telephone Encounter (Signed)
 Pt called in stating the he was at [facility] for a procedure and he was told the he going to have a Echo today Please Advise

## 2024-08-13 NOTE — Telephone Encounter (Signed)
 Called patient and pt verbalized that he was suppose to have an echo ordered. Per Jackee Alberts order place for Echo. Made pt aware that this would be sent to scheduling to get echo scheduled. Pt verbalized an understanding.

## 2024-08-14 ENCOUNTER — Ambulatory Visit: Payer: Self-pay | Admitting: Nurse Practitioner

## 2024-08-14 ENCOUNTER — Ambulatory Visit (HOSPITAL_COMMUNITY)
Admission: RE | Admit: 2024-08-14 | Discharge: 2024-08-14 | Disposition: A | Discharge status: Home / Self Care | Source: Ambulatory Visit | Attending: Cardiology | Admitting: Cardiology

## 2024-08-14 DIAGNOSIS — R9439 Abnormal result of other cardiovascular function study: Secondary | ICD-10-CM | POA: Diagnosis not present

## 2024-08-14 DIAGNOSIS — Z01810 Encounter for preprocedural cardiovascular examination: Secondary | ICD-10-CM | POA: Insufficient documentation

## 2024-08-14 DIAGNOSIS — I2510 Atherosclerotic heart disease of native coronary artery without angina pectoris: Secondary | ICD-10-CM | POA: Insufficient documentation

## 2024-08-14 DIAGNOSIS — R9431 Abnormal electrocardiogram [ECG] [EKG]: Secondary | ICD-10-CM | POA: Insufficient documentation

## 2024-08-14 LAB — ECHOCARDIOGRAM COMPLETE
AR max vel: 3.62 cm2
AV Area VTI: 3.47 cm2
AV Area mean vel: 3.33 cm2
AV Mean grad: 7 mmHg
AV Peak grad: 12.7 mmHg
Ao pk vel: 1.78 m/s
Area-P 1/2: 2.67 cm2
P 1/2 time: 603 ms
S' Lateral: 2.5 cm

## 2024-08-15 DIAGNOSIS — H04123 Dry eye syndrome of bilateral lacrimal glands: Secondary | ICD-10-CM | POA: Diagnosis not present

## 2024-08-15 DIAGNOSIS — Z9689 Presence of other specified functional implants: Secondary | ICD-10-CM | POA: Diagnosis not present

## 2024-08-15 DIAGNOSIS — H401133 Primary open-angle glaucoma, bilateral, severe stage: Secondary | ICD-10-CM | POA: Diagnosis not present

## 2024-08-15 NOTE — Anesthesia Preprocedure Evaluation (Addendum)
 Anesthesia Evaluation  Patient identified by MRN, date of birth, ID band Patient awake    Reviewed: Allergy & Precautions, NPO status , Patient's Chart, lab work & pertinent test results  History of Anesthesia Complications (+) PONV and history of anesthetic complications  Airway Mallampati: III   Neck ROM: Full    Dental  (+) Dental Advisory Given, Chipped, Missing,    Pulmonary neg shortness of breath, sleep apnea and Continuous Positive Airway Pressure Ventilation , neg COPD, neg recent URI, former smoker   Pulmonary exam normal breath sounds clear to auscultation       Cardiovascular hypertension (olmesartan ), Pt. on medications (-) angina + CAD and + Cardiac Stents  (-) CABG + Valvular Problems/Murmurs  Rhythm:Regular Rate:Normal  HLD, thoracic aortic aneurysm  TTE 08/14/2024: IMPRESSIONS    1. Left ventricular ejection fraction, by estimation, is 60 to 65%. Left  ventricular ejection fraction by 3D volume is 66 %. The left ventricle has  normal function. The left ventricle has no regional wall motion  abnormalities. Left ventricular diastolic   parameters were normal.   2. Right ventricular systolic function is normal. The right ventricular  size is normal.   3. The mitral valve is normal in structure. No evidence of mitral valve  regurgitation. No evidence of mitral stenosis.   4. The aortic valve is tricuspid. Aortic valve regurgitation is mild. No  aortic stenosis is present.   5. 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   6. The inferior vena cava is dilated in size with <50% respiratory  variability, suggesting right atrial pressure of 15 mmHg.   Stress Test 08/07/2024: Small sized, mild intensity, reversible perfusion defect of apical anterior and apex, consider either distal LAD disease or soft tissue attenuation (BMI 37).  Wall motion in this segment is preserved.  No evidence of  infarction.  LVEF is 55%, wall thickness preserved, no wall motion abnormalities noted. Overall low risk study.  Clinical correlation required.    Neuro/Psych  Headaches, neg Seizures  Neuromuscular disease (right cervical radiculopathy s/p ACDF)    GI/Hepatic negative GI ROS, Neg liver ROS,,,  Endo/Other  diabetes, Type 2, Insulin  Dependent    Renal/GU CRFRenal disease (right renal mass)   H/o prostate cancer    Musculoskeletal  (+) Arthritis ,    Abdominal  (+) + obese  Peds  Hematology  (+) Blood dyscrasia, anemia Lab Results      Component                Value               Date                      WBC                      6.8                 08/13/2024                HGB                      12.3 (L)            08/13/2024                HCT  39.7                08/13/2024                MCV                      91.5                08/13/2024                PLT                      195                 08/13/2024              Anesthesia Other Findings Last Trulicity : 08/07/2024  Reproductive/Obstetrics                              Anesthesia Physical Anesthesia Plan  ASA: 3  Anesthesia Plan: General   Post-op Pain Management: Tylenol  PO (pre-op)*   Induction: Intravenous  PONV Risk Score and Plan: 3 and Ondansetron , Dexamethasone  and Treatment may vary due to age or medical condition  Airway Management Planned: Oral ETT  Additional Equipment:   Intra-op Plan:   Post-operative Plan: Extubation in OR  Informed Consent: I have reviewed the patients History and Physical, chart, labs and discussed the procedure including the risks, benefits and alternatives for the proposed anesthesia with the patient or authorized representative who has indicated his/her understanding and acceptance.     Dental advisory given  Plan Discussed with: CRNA and Anesthesiologist  Anesthesia Plan Comments: (See PAT note  08/13/2024  Risks of general anesthesia discussed including, but not limited to, sore throat, hoarse voice, chipped/damaged teeth, injury to vocal cords, nausea and vomiting, allergic reactions, lung infection, heart attack, stroke, and death. All questions answered. )         Anesthesia Quick Evaluation

## 2024-08-15 NOTE — Progress Notes (Signed)
 Anesthesia Chart Review   Case: [id number] Date/Time: 08/20/24 1145   Procedure: CT WITH ANESTHESIA (Right) - CT CRYOABLATION RIGHT RENAL MASS   Anesthesia type: General   Pre-op diagnosis: RIGHT RENAL MASS   [facility]   Surgeons: Radiologist, Medication, MD       DISCUSSION:77 y.o. former smoker with h/o PONV, OSA on CPAP, HTN, CAD s/p DES, CKD, DM II (7.4 on 07/22/2024), prostate cancer, right renal mass scheduled for above procedure 08/20/2024.   Pt seen by cardiology 07/25/2024 for preoperative evaluation 07/25/2024. Per OV note, - Patient's RCRI score is 6.6%   The patient affirms he has been doing well without any new cardiac symptoms. They are able to achieve 7 METS without cardiac limitations. Therefore, based on ACC/AHA guidelines, the patient would be at acceptable risk for the planned procedure without further cardiovascular testing. The patient was advised that if he develops new symptoms prior to surgery to contact our office to arrange for a follow-up visit, and he verbalized understanding.  Moderate risk for renal biopsy due to heart disease and abnormal EKG. Functional assessment indicates capability for daily activities. -Patient completed an Echo and stress test that showed no acute changes or abnormalities. - Patient is cleared for biopsy with no further cardiac work up required.  VS: BP (!) 154/76   Pulse (!) 58   Temp 36.6 C (Oral)   Resp 16   Ht 6' (1.829 m)   Wt 122.9 kg   SpO2 99%   BMI 36.75 kg/m   PROVIDERS: Verdia Lombard, MD  Primary Cardiologist:  Alm Clay, MD  LABS: Labs reviewed: Acceptable for surgery. (all labs ordered are listed, but only abnormal results are displayed)  Labs Reviewed  CBC WITH DIFFERENTIAL/PLATELET - Abnormal; Notable for the following components:      Result Value   Hemoglobin 12.3 (*)    All other components within normal limits  BASIC METABOLIC PANEL WITH GFR - Abnormal; Notable for the following  components:   Glucose, Bld 108 (*)    BUN 43 (*)    Creatinine, Ser 2.03 (*)    GFR, Estimated 33 (*)    All other components within normal limits  PROTIME-INR  GLUCOSE, CAPILLARY     IMAGES:   EKG:   CV: Echo 08/14/2024 1. Left ventricular ejection fraction, by estimation, is 60 to 65%. Left  ventricular ejection fraction by 3D volume is 66 %. The left ventricle has  normal function. The left ventricle has no regional wall motion  abnormalities. Left ventricular diastolic   parameters were normal.   2. Right ventricular systolic function is normal. The right ventricular  size is normal.   3. The mitral valve is normal in structure. No evidence of mitral valve  regurgitation. No evidence of mitral stenosis.   4. The aortic valve is tricuspid. Aortic valve regurgitation is mild. No  aortic stenosis is present.   5. 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   6. The inferior vena cava is dilated in size with <50% respiratory  variability, suggesting right atrial pressure of 15 mmHg.   Myocardial Perfusion 08/07/24   Stress ECG is diagnostic due to baseline ST-T changes and pharmacological stress protocol.   LV perfusion is abnormal. Defect 1: There is a small defect with mild reduction in uptake present in the apical anterior and apex location(s) that is reversible. There is normal wall motion in the defect area. Consistent with soft tissue  attenuation versus subtle ischemia in the distal LAD cannot be ruled out.   Left ventricular function is normal. Nuclear stress EF: 55%. The left ventricular ejection fraction is normal (55-65%). End diastolic cavity size is enlarged (EDV 130 cc)   Coronary calcium was present on the attenuation correction CT images. Severe coronary calcifications were present. Coronary calcifications were present in the left anterior descending artery, left circumflex artery and right coronary artery distribution(s).  Aortic  atherosclerosis.   Prior study available for comparison. 2018, low risk study   The study is low risk.   Small sized, mild intensity, reversible perfusion defect of apical anterior and apex, consider either distal LAD disease or soft tissue attenuation (BMI 37).  Wall motion in this segment is preserved.  No evidence of infarction.  LVEF is 55%, wall thickness preserved, no wall motion abnormalities noted. Overall low risk study.  Clinical correlation required. Past Medical History:  Diagnosis Date   Arthritis    knees (05/10/2016)   CAD S/P percutaneous coronary angioplasty 2011   a. 2011: s/p PCI to PDA with Endeavor 2.5 mm x 12 mm DES - [facility] b. low-risk NST in 07/2015   Cancer of kidney [facility])    Chronic kidney disease    Daily headache    recently (05/10/2016)   Dyslipidemia, goal LDL below 70    Glaucoma    Gout    Heart murmur    History of kidney stones    Hypertension, essential    Obesity, Class II, BMI 35-39.9, with comorbidity    BMI 37   OSA on CPAP    PONV (postoperative nausea and vomiting)    after colonoscopy   10-15 yr ago per pt.   Prostate cancer (HCC) 2007   Rash since 04-08-16   on neck healing, right arm improving   Thoracic aortic aneurysm without rupture (HCC) - Noted on chest CT. 4.5 cm 02/15/2016   4.5 cm per dr lucas note and [facility] chest ct 01-18-16   Type II diabetes mellitus [facility])     Past Surgical History:  Procedure Laterality Date   ANTERIOR CERVICAL DECOMP/DISCECTOMY FUSION N/A 11/09/2017   Procedure: Cervical four-five, Cervical five-six Anterior discectomy with fusion and plate fixation;  Surgeon: Ditty, Morene Hicks, MD;  Location: [facility];  Service: Neurosurgery;  Laterality: N/A;   COLONOSCOPY W/ BIOPSIES AND POLYPECTOMY  several   CORONARY ANGIOPLASTY WITH STENT PLACEMENT  2011   [place] : Endeavor 2.5 mm x 12 mm DES - distal PDA ([facility])   EYE SURGERY     glaucoma and cataract   IR  RADIOLOGIST EVAL & MGMT  07/09/2024   LAPAROSCOPIC NEPHRECTOMY Left 04/13/2016   Procedure: LAPAROSCOPIC RADICAL LEFT NEPHRECTOMY;  Surgeon: Gretel Ferrara, MD;  Location[facility];  Service: Urology;  Laterality: Left;   NM MYOVIEW  ([facility] HX)  6/'13, 3/'16   a. Treadmill Myoview : 10 minutes, 12 METS; no ischemia infarction, EF 65%; b. [facility]: LOW RISK. EF 57%. 10.4 METS low risk scan no ischemia. No wall motion abnormality.    NM MYOVIEW  LTD  05/2017   EF 50%. Normal function. No ischemia or infarction.   PROSTATECTOMY     REFRACTIVE SURGERY Left    TOTAL KNEE ARTHROPLASTY Left 05/27/2019   Procedure: Left Knee Arthroplasty;  Surgeon: Sheril Coy, MD;  Location[facility];  Service: Orthopedics;  Laterality: Left;   TRANSTHORACIC ECHOCARDIOGRAM  05/2016   EF 60 to 65%.  No R WMA.  GR 1 DD.  Normal valves.    MEDICATIONS:  allopurinol  (ZYLOPRIM ) 100 MG tablet   aspirin  EC 81 MG tablet   AZOR  10-40 MG per tablet   bimatoprost (LUMIGAN) 0.01 % SOLN   Carboxymethylcellulose Sodium (REFRESH LIQUIGEL) 1 % GEL   diphenhydramine -acetaminophen  (TYLENOL  PM) 25-500 MG TABS tablet   Dulaglutide  (TRULICITY ) 1.5 MG/0.5ML SOAJ   fenofibrate  (TRICOR ) 145 MG tablet   furosemide  (LASIX ) 80 MG tablet   LANTUS SOLOSTAR 100 UNIT/ML Solostar Pen   Methylcellulose, Laxative, (CITRUCEL) 500 MG TABS   nitroGLYCERIN  (NITROSTAT ) 0.4 MG SL tablet   NOVOLOG  FLEXPEN 100 UNIT/ML FlexPen   ofloxacin (OCUFLOX) 0.3 % ophthalmic solution   olmesartan  (BENICAR ) 20 MG tablet   sildenafil  (VIAGRA ) 50 MG tablet   timolol  (TIMOPTIC ) 0.5 % ophthalmic solution   TURMERIC CURCUMIN PO   VYTORIN  10-40 MG per tablet   No current facility-administered medications for this encounter.    0.9 %  sodium chloride  infusion     Harlene Hoots Ward, PA-C [facility] Pre-Surgical Testing [phone]

## 2024-08-19 ENCOUNTER — Other Ambulatory Visit: Payer: Self-pay

## 2024-08-19 NOTE — H&P (Signed)
 Chief Complaint: Patient was seen in consultation today for right renal mass, with consideration for percutaneous cryoablation.  Referring Provider(s): Dr. Gretel Ferrara, MD  Supervising Physician: Luverne Aran  Patient Status: [facility] - Out-pt  Patient is Full Code  History of Present Illness: Jacob Berger is a 78 y.o. male  with PMHx notable for right renal mass, left nephrectomy (04/13/2016) secondary to multiple clear-cell renal carcinomas, HLD, HTN, CAD s/p PCI, heart murmur, OSA, CKD, DMT2 headaches, glaucoma, gout, nephrolithiasis, obesity, prostate cancer, arthritis, and others as delineated below.  Per Dr. Herminia consult note dated 9/3: [...] Jacob Berger is a 78 y.o. male status post left nephrectomy on 04/13/2016 for multiple clear cell renal carcinomas, the largest measuring 3 cm. Margins were negative. MRI in 2018 and serial renal ultrasound studies were negative for recurrence or right renal lesions until recently this year, demonstrating a complex cystic lesion of the right kidney. MRI on 06/16/24 demonstrated an enhancing solid mass of the upper pole of the right kidney estimated at 3.6 x 2.3 x 2.0 cm in size and suspicious for renal carcinoma. There is no evidence of metastatic disease in the abdomen or renal vein tumor. Recent CT chest on 07/03/24 shows no metastatic disease and stable known aneurysmal disease of the ascending thoracic aorta, measuring about 4.5 cm in maximum diameter, which has been followed by Dr. Lucas. Jacob Berger is asymptomatic and denies abdominal pain, hematuria or urinary symptoms.    He cannot see well after prior bilateral glaucoma and cataract surgeries. He has a history of coronary artery disease and prior PCI and is followed by Dr. Anner. He has a history of CKD with baseline Cr around 1.37 and GFR 52 mL/min.  [...] The mass is of high likelihood to represent a renal carcinoma by imaging characteristics and is treatable in size  and location with percutaneous cryoablation. Biopsy could be performed at the time of the procedure to establish a tissue diagnosis.    Given his history of left nephrectomy and underlying CKD, ablation would certainly be the least invasive means to treat this mass and most nephron sparing procedure. At it's size, there would be a roughly 10-20% likelihood of marginal recurrence after one treatment, but any recurrence could likely be retreated. I discussed details of ablation with Jacob Berger including need for general anesthesia. Given his history of CAD and PCI, I would like him to see Dr. Anner for cardiac clearance before the procedure. We will begin the authorization and scheduling process in the meantime.   Patient is scheduled for percutaneous cryoablation of right renal mass in IR today.   Patient is alert and laying in bed, calm.  Patient is currently without any significant complaints.  Patient denies any fevers, headache, chest pain, SOB, cough, abdominal pain, nausea, vomiting or bleeding.     Past Medical History:  Diagnosis Date   Arthritis    knees (05/10/2016)   CAD S/P percutaneous coronary angioplasty 2011   a. 2011: s/p PCI to PDA with Endeavor 2.5 mm x 12 mm DES - [facility] b. low-risk NST in 07/2015   Cancer of kidney [facility])    Chronic kidney disease    Daily headache    recently (05/10/2016)   Dyslipidemia, goal LDL below 70    Glaucoma    Gout    Heart murmur    History of kidney stones    Hypertension, essential    Obesity, Class II, BMI 35-39.9,  with comorbidity    BMI 37   OSA on CPAP    PONV (postoperative nausea and vomiting)    after colonoscopy   10-15 yr ago per pt.   Prostate cancer (HCC) 2007   Rash since 04-08-16   on neck healing, right arm improving   Thoracic aortic aneurysm without rupture (HCC) - Noted on chest CT. 4.5 cm 02/15/2016   4.5 cm per dr lucas note and [facility] ct 01-18-16   Type II diabetes mellitus [facility])      Past Surgical History:  Procedure Laterality Date   ANTERIOR CERVICAL DECOMP/DISCECTOMY FUSION N/A 11/09/2017   Procedure: Cervical four-five, Cervical five-six Anterior discectomy with fusion and plate fixation;  Surgeon: Ditty, Morene Hicks, MD;  Location: [facility] OR;  Service: Neurosurgery;  Laterality: N/A;   COLONOSCOPY W/ BIOPSIES AND POLYPECTOMY  several   CORONARY ANGIOPLASTY WITH STENT PLACEMENT  2011   [place] : Endeavor 2.5 mm x 12 mm DES - distal PDA ([facility])   EYE SURGERY     glaucoma and cataract   IR RADIOLOGIST EVAL & MGMT  07/09/2024   LAPAROSCOPIC NEPHRECTOMY Left 04/13/2016   Procedure: LAPAROSCOPIC RADICAL LEFT NEPHRECTOMY;  Surgeon: Gretel Ferrara, MD;  Location: [facility] ORS;  Service: Urology;  Laterality: Left;   NM MYOVIEW  ([facility] HX)  6/'13, 3/'16   a. Treadmill Myoview : 10 minutes, 12 METS; no ischemia infarction, EF 65%; b. [facility]: LOW RISK. EF 57%. 10.4 METS low risk scan no ischemia. No wall motion abnormality.    NM MYOVIEW  LTD  05/2017   EF 50%. Normal function. No ischemia or infarction.   PROSTATECTOMY     REFRACTIVE SURGERY Left    TOTAL KNEE ARTHROPLASTY Left 05/27/2019   Procedure: Left Knee Arthroplasty;  Surgeon: Sheril Coy, MD;  Location: [facility] ORS;  Service: Orthopedics;  Laterality: Left;   TRANSTHORACIC ECHOCARDIOGRAM  05/2016   EF 60 to 65%.  No R WMA.  GR 1 DD.  Normal valves.    Allergies: Accupril [quinapril hcl]  Medications: Prior to Admission medications   Medication Sig Start Date End Date Taking? Authorizing Provider  allopurinol  (ZYLOPRIM ) 100 MG tablet Take 100 mg by mouth daily.    [provider]  aspirin  EC 81 MG tablet Take 81 mg by mouth daily.    [provider]  AZOR  10-40 MG per tablet TAKE 1 TABLET BY MOUTH DAILY 12/18/13   Anner Alm ORN, MD  bimatoprost (LUMIGAN) 0.01 % SOLN Place 1 drop into both eyes at bedtime.    [provider]  Carboxymethylcellulose Sodium (REFRESH  LIQUIGEL) 1 % GEL Place 1 drop into both eyes 4 (four) times daily as needed (eye irritation).    [provider]  diphenhydramine -acetaminophen  (TYLENOL  PM) 25-500 MG TABS tablet Take 1 tablet by mouth at bedtime as needed (sleep).    [provider]  Dulaglutide  (TRULICITY ) 1.5 MG/0.5ML SOAJ Inject 1.5 mg into the skin once a week.    [provider]  fenofibrate  (TRICOR ) 145 MG tablet Take 145 mg by mouth daily.    [provider]  furosemide  (LASIX ) 80 MG tablet Take 20-80 mg by mouth daily as needed for fluid or edema.    [provider]  LANTUS SOLOSTAR 100 UNIT/ML Solostar Pen Inject 18-20 Units into the skin at bedtime. 05/30/24   [provider]  Methylcellulose, Laxative, (CITRUCEL) 500 MG TABS Take 500 mg by mouth daily.    [provider]  nitroGLYCERIN  (NITROSTAT ) 0.4  MG SL tablet Place 1 tablet (0.4 mg total) under the tongue every 5 (five) minutes as needed for chest pain. 07/25/24 10/23/24  Anner Alm ORN, MD  NOVOLOG  FLEXPEN 100 UNIT/ML FlexPen Inject 18-20 Units into the skin 3 (three) times daily before meals. 08/10/16   [provider]  ofloxacin (OCUFLOX) 0.3 % ophthalmic solution Place 1 drop into the left eye at bedtime. 07/22/24   [provider]  olmesartan  (BENICAR ) 20 MG tablet Take 20 mg by mouth daily. 07/29/24   [provider]  sildenafil  (VIAGRA ) 50 MG tablet Take 1 tablet (total 50 mg)  to 1.5 tablet ( total 75 mg) as needed for erectile dysfunction 03/29/20   Anner Alm ORN, MD  timolol  (TIMOPTIC ) 0.5 % ophthalmic solution Place 1 drop into both eyes 2 (two) times daily. Morning and afternoon 10/01/17   [provider]  TURMERIC CURCUMIN PO Take 1 each by mouth daily.    [provider]  VYTORIN  10-40 MG per tablet Take 1 tablet by mouth daily. 04/27/15   [provider]     Family History  Problem Relation Age of Onset   Hypertension Son     Social  History   Socioeconomic History   Marital status: Married    Spouse name: Not on file   Number of children: Not on file   Years of education: Not on file   Highest education level: Not on file  Occupational History   Not on file  Tobacco Use   Smoking status: Former    Current packs/day: 0.00    Types: Cigarettes    Quit date: 06/04/1993    Years since quitting: 31.2   Smokeless tobacco: Never   Tobacco comments:    A PK WOULD LAST A WEEK  Vaping Use   Vaping status: Never Used  Substance and Sexual Activity   Alcohol use: Yes    Alcohol/week: 2.0 standard drinks of alcohol    Types: 1 Glasses of wine, 1 Cans of beer per week    Comment: rare   Drug use: No   Sexual activity: Not Currently  Other Topics Concern   Not on file  Social History Narrative   Married father of 3. Had been walking up to 2 miles every other day appetite is good his weight for about half an hour time period, but limited now due to back pain. Occasional alcohol. No tobacco products -- he quit about 20-30 years ago.   Social Drivers of Corporate investment banker Strain: Low Risk  (05/06/2019)   [facility]   Overall Financial Resource Strain (CARDIA)    Difficulty of Paying Living Expenses: Not hard at all  Food Insecurity: Unknown (05/06/2019)   Received from [facility]   Hunger Vital Sign    Within the past 12 months, you worried that your food would run out before you got the money to buy more.: Patient declined    Within the past 12 months, the food you bought just didn't last and you didn't have money to get more.: Patient declined  Transportation Needs: Unknown (05/06/2019)   Received from [facility]   PRAPARE - Transportation    Lack of Transportation (Medical): Patient declined    Lack of Transportation (Non-Medical): Patient declined  Physical Activity: Not on file  Stress: Not on file  Social Connections: Not on file     Review of Systems: A 12 point ROS  discussed and pertinent  positives are indicated in the HPI above.  All other systems are negative.  Vital Signs: Ht 6' (1.829 m)   Wt 270 lb 15.1 oz (122.9 kg)   BMI 36.75 kg/m   Advance Care Plan: The advanced care place/surrogate decision maker was discussed at the time of visit and the patient did not wish to discuss or was not able to name a surrogate decision maker or provide an advance care plan.  Physical Exam Vitals reviewed.  Constitutional:      General: He is not in acute distress.    Appearance: Normal appearance. He is obese.  HENT:     Mouth/Throat:     Mouth: Mucous membranes are dry.  Cardiovascular:     Rate and Rhythm: Normal rate and regular rhythm.     Pulses: Normal pulses.     Heart sounds: Normal heart sounds.  Pulmonary:     Effort: Pulmonary effort is normal.     Breath sounds: Rales present.  Abdominal:     General: Abdomen is flat.  Musculoskeletal:        General: Normal range of motion.     Cervical back: Normal range of motion.     Comments: No CVA tenderness on right side.  Skin:    General: Skin is warm and dry.  Neurological:     Mental Status: He is alert and oriented to person, place, and time.  Psychiatric:        Mood and Affect: Mood normal.        Behavior: Behavior normal.        Thought Content: Thought content normal.        Judgment: Judgment normal.     Imaging: ECHOCARDIOGRAM COMPLETE Result Date: 08/14/2024    ECHOCARDIOGRAM REPORT   Patient Name:   Jacob Berger Date of Exam: 08/14/2024 Medical Rec #:  [id number]           Height:       72.0 in Accession #:    [id number]          Weight:       271.0 lb Date of Birth:  08/17/1946           BSA:          2.424 m Patient Age:    77 years            BP:           133/84 mmHg Patient Gender: M                   HR:           59 bpm. Exam Location:  [address] Procedure: 2D Echo, 3D Echo, Cardiac Doppler and Color Doppler (Both Spectral            and Color Flow Doppler were  utilized during procedure). Indications:    R94.39 Abnormal Stress Testing                 R94.31 Abnormal EKG  History:        Patient has prior history of Echocardiogram examinations, most                 recent 05/10/2016. CAD, Abnormal ECG, Signs/Symptoms:Murmur; Risk                 Factors:Sleep Apnea, Dyslipidemia, Hypertension, Former Smoker                 and  Diabetes. Ascending Aortic Aneurysm (Prior 4.5cm by CT),                 Chronic Kidney Disease, Pre-Operative Eval for Renal Surgery,                 Obesity.  Sonographer:    Heather Hawks RDCS Referring Phys: JACKEE DEL DICK IMPRESSIONS  1. Left ventricular ejection fraction, by estimation, is 60 to 65%. Left ventricular ejection fraction by 3D volume is 66 %. The left ventricle has normal function. The left ventricle has no regional wall motion abnormalities. Left ventricular diastolic  parameters were normal.  2. Right ventricular systolic function is normal. The right ventricular size is normal.  3. The mitral valve is normal in structure. No evidence of mitral valve regurgitation. No evidence of mitral stenosis.  4. The aortic valve is tricuspid. Aortic valve regurgitation is mild. No aortic stenosis is present.  5. 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  6. The inferior vena cava is dilated in size with <50% respiratory variability, suggesting right atrial pressure of 15 mmHg. FINDINGS  Left Ventricle: Left ventricular ejection fraction, by estimation, is 60 to 65%. Left ventricular ejection fraction by 3D volume is 66 %. The left ventricle has normal function. The left ventricle has no regional wall motion abnormalities. The left ventricular internal cavity size was normal in size. There is no left ventricular hypertrophy. Left ventricular diastolic parameters were normal. Right Ventricle: The right ventricular size is normal. No increase in right ventricular wall thickness. Right ventricular systolic function is  normal. Left Atrium: Left atrial size was normal in size. Right Atrium: Right atrial size was normal in size. Pericardium: There is no evidence of pericardial effusion. Mitral Valve: The mitral valve is normal in structure. No evidence of mitral valve regurgitation. No evidence of mitral valve stenosis. Tricuspid Valve: The tricuspid valve is normal in structure. Tricuspid valve regurgitation is mild . No evidence of tricuspid stenosis. Aortic Valve: The aortic valve is tricuspid. Aortic valve regurgitation is mild. Aortic regurgitation PHT measures 603 msec. No aortic stenosis is present. Aortic valve mean gradient measures 7.0 mmHg. Aortic valve peak gradient measures 12.7 mmHg. Aortic valve area, by VTI measures 3.47 cm. Pulmonic Valve: The pulmonic valve was grossly normal. Pulmonic valve regurgitation is not visualized. No evidence of pulmonic stenosis. Aorta: The aortic root is normal in size and structure. 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 Venous: The inferior vena cava is dilated in size with less than 50% respiratory variability, suggesting right atrial pressure of 15 mmHg. IAS/Shunts: No atrial level shunt detected by color flow Doppler. Additional Comments: 3D was performed not requiring image post processing on an independent workstation and was normal.  LEFT VENTRICLE PLAX 2D LVIDd:         4.60 cm         Diastology LVIDs:         2.50 cm         LV e' medial:    6.14 cm/s LV PW:         1.20 cm         LV E/e' medial:  13.5 LV IVS:        1.30 cm         LV e' lateral:   7.51 cm/s LVOT diam:     2.30 cm         LV E/e' lateral: 11.0 LV  SV:         149 LV SV Index:   62 LVOT Area:     4.15 cm        3D Volume EF LV IVRT:       81 msec         LV 3D EF:    Left                                             ventricul                                             ar                                             ejection                                             fraction                                              by 3D                                             volume is                                             66 %.                                 3D Volume EF:                                3D EF:        66 %                                LV EDV:       129 ml                                LV ESV:       44 ml                                LV SV:        85 ml RIGHT VENTRICLE RV Basal diam:  3.40 cm     PULMONARY VEINS RV S prime:  15.50 cm/s  Diastolic Velocity: 34.60 cm/s TAPSE (M-mode): 2.2 cm      S/D Velocity:       1.60 RVSP:           31.7 mmHg   Systolic Velocity:  56.20 cm/s LEFT ATRIUM            Index        RIGHT ATRIUM           Index LA diam:      4.60 cm  1.90 cm/m   RA Pressure: 3.00 mmHg LA Vol (A2C): 103.0 ml 42.49 ml/m  RA Area:     19.20 cm LA Vol (A4C): 47.1 ml  19.43 ml/m  RA Volume:   56.40 ml  23.27 ml/m  AORTIC VALVE AV Area (Vmax):    3.62 cm AV Area (Vmean):   3.33 cm AV Area (VTI):     3.47 cm AV Vmax:           178.00 cm/s AV Vmean:          123.000 cm/s AV VTI:            0.430 m AV Peak Grad:      12.7 mmHg AV Mean Grad:      7.0 mmHg LVOT Vmax:         155.00 cm/s LVOT Vmean:        98.500 cm/s LVOT VTI:          0.360 m LVOT/AV VTI ratio: 0.84 AI PHT:            603 msec  AORTA Ao Root diam: 3.80 cm Ao Asc diam:  5.40 cm MITRAL VALVE               TRICUSPID VALVE MV Area (PHT): cm         TR Peak grad:   28.7 mmHg MV Decel Time: 284 msec    TR Vmax:        268.00 cm/s MV E velocity: 82.80 cm/s  Estimated RAP:  3.00 mmHg MV A velocity: 94.50 cm/s  RVSP:           31.7 mmHg MV E/A ratio:  0.88                            SHUNTS                            Systemic VTI:  0.36 m                            Systemic Diam: 2.30 cm Jacob Azobou Berger Electronically signed by Jacob Cedars Berger Signature Date/Time: 08/14/2024/1:40:47 PM    Final    DG Chest 1 View Result Date: 08/13/2024 CLINICAL DATA:  Preop, right renal mass. EXAM: CHEST   1 VIEW COMPARISON:  Radiograph 05/23/2019, CT 07/03/2024 FINDINGS: The cardiomediastinal contours are stable. Aortic atherosclerosis and tortuosity. The lungs are clear. Pulmonary vasculature is normal. No consolidation, pleural effusion, or pneumothorax. No acute osseous abnormalities are seen. IMPRESSION: No active disease. Electronically Signed   By: Andrea Gasman M.D.   On: 08/13/2024 18:01   MYOCARDIAL PERFUSION/CT RAD READ Result Date: 08/12/2024 CLINICAL DATA:  This over-read does not include interpretation of cardiac or coronary anatomy or pathology. The cardiac SPECT CT interpretation by the cardiologist is attached. COMPARISON:  CT May 07, 2023 MRI June 16, 2024 FINDINGS: Vascular: Aortic atherosclerosis. Dilation of the ascending thoracic aorta measuring 4.6 cm, unchanged. Three-vessel coronary artery calcifications/stents. Mediastinum/Nodes: No pathologically enlarged mediastinal lymph nodes. Lungs/Pleura: Motion degraded examination of the lungs. Upper Abdomen: Known right renal cell carcinoma better seen on MRI June 16, 2024. Musculoskeletal: Thoracic spondylosis. IMPRESSION: 1. Dilation of the ascending thoracic aorta measuring 4.6 cm, unchanged. 2. Known right renal cell carcinoma better seen on MRI June 16, 2024. 3. Aortic atherosclerosis. Aortic Atherosclerosis (ICD10-I70.0). Electronically Signed   By: Reyes Holder M.D.   On: 08/12/2024 12:33   MYOCARDIAL PERFUSION IMAGING Result Date: 08/07/2024   Stress ECG is diagnostic due to baseline ST-T changes and pharmacological stress protocol.   LV perfusion is abnormal. Defect 1: There is a small defect with mild reduction in uptake present in the apical anterior and apex location(s) that is reversible. There is normal wall motion in the defect area. Consistent with soft tissue attenuation versus subtle ischemia in the distal LAD cannot be ruled out.   Left ventricular function is normal. Nuclear stress EF: 55%. The left ventricular  ejection fraction is normal (55-65%). End diastolic cavity size is enlarged (EDV 130 cc)   Coronary calcium was present on the attenuation correction CT images. Severe coronary calcifications were present. Coronary calcifications were present in the left anterior descending artery, left circumflex artery and right coronary artery distribution(s).  Aortic atherosclerosis.   Prior study available for comparison. 2018, low risk study   The study is low risk. Small sized, mild intensity, reversible perfusion defect of apical anterior and apex, consider either distal LAD disease or soft tissue attenuation (BMI 37).  Wall motion in this segment is preserved.  No evidence of infarction.  LVEF is 55%, wall thickness preserved, no wall motion abnormalities noted. Overall low risk study.  Clinical correlation required.    Labs:  CBC: Recent Labs    08/13/24 0954 08/20/24 1140  WBC 6.8 6.5  HGB 12.3* 11.3*  HCT 39.7 35.9*  PLT 195 181    COAGS: Recent Labs    08/13/24 0954 08/20/24 1140  INR 1.0 1.1    BMP: Recent Labs    08/13/24 0954 08/20/24 1140  NA 138 142  K 4.1 4.0  CL 104 107  CO2 22 23  GLUCOSE 108* 106*  BUN 43* 39*  CALCIUM 10.0 9.6  CREATININE 2.03* 1.65*  GFRNONAA 33* 42*    LIVER FUNCTION TESTS: No results for input(s): BILITOT, AST, ALT, ALKPHOS, PROT, ALBUMIN  in the last 8760 hours.  TUMOR MARKERS: No results for input(s): AFPTM, CEA, CA199, CHROMGRNA in the last 8760 hours.  Assessment and Plan: Patient has a history of left nephrectomy, now with a right renal mass.  He is referred by his urologist, Dr. Renda, for recommended percutaneous cryoablation.  Patient consulted with Dr. Luverne, and is in agreement with current plan.  He presents for scheduled percutaneous cryoablation of right renal mass under general anesthesia in IR today.  Patient has been NPO since midnight.  All labs and medications are within acceptable parameters.   Allergies reviewed: Accupril.  Risks and benefits of image guided renal cryoablation was discussed with the patient including, but not limited to, failure to treat entire lesion, bleeding, infection, damage to adjacent structures, hematuria, urine leak, decrease in renal function or post procedural neuropathy.  All of the patient's questions were answered and the patient is agreeable to proceed. Consent signed and in chart.  Thank you for allowing our service to participate in Jacob Berger 's care.  Electronically Signed: Carlin DELENA Griffon, PA-C   08/20/2024, 12:36 PM      I spent a total of  25 Minutes in face to face in clinical consultation, greater than 50% of which was counseling/coordinating care for right renal mass, with consideration for percutaneous cryoablation.

## 2024-08-19 NOTE — H&P (Deleted)
   The note originally documented on this encounter has been moved the the encounter in which it belongs.

## 2024-08-20 ENCOUNTER — Ambulatory Visit (HOSPITAL_COMMUNITY): Payer: Self-pay | Admitting: Anesthesiology

## 2024-08-20 ENCOUNTER — Encounter (HOSPITAL_COMMUNITY)
Admission: RE | Disposition: A | Discharge status: Home / Self Care | Payer: Self-pay | Source: Home / Self Care | Attending: Interventional Radiology

## 2024-08-20 ENCOUNTER — Ambulatory Visit (HOSPITAL_COMMUNITY): Payer: Self-pay | Admitting: Medical

## 2024-08-20 ENCOUNTER — Observation Stay (HOSPITAL_COMMUNITY)
Admission: RE | Admit: 2024-08-20 | Discharge: 2024-08-20 | Disposition: A | Source: Ambulatory Visit | Attending: Interventional Radiology | Admitting: Interventional Radiology

## 2024-08-20 ENCOUNTER — Encounter (HOSPITAL_COMMUNITY): Payer: Self-pay

## 2024-08-20 ENCOUNTER — Other Ambulatory Visit: Payer: Self-pay

## 2024-08-20 ENCOUNTER — Observation Stay (HOSPITAL_COMMUNITY)
Admission: RE | Admit: 2024-08-20 | Discharge: 2024-08-20 | Disposition: A | Discharge status: Home / Self Care | Attending: Interventional Radiology | Admitting: Interventional Radiology

## 2024-08-20 VITALS — Ht 72.0 in | Wt 270.9 lb

## 2024-08-20 DIAGNOSIS — Z7985 Long-term (current) use of injectable non-insulin antidiabetic drugs: Secondary | ICD-10-CM | POA: Insufficient documentation

## 2024-08-20 DIAGNOSIS — I2510 Atherosclerotic heart disease of native coronary artery without angina pectoris: Secondary | ICD-10-CM | POA: Insufficient documentation

## 2024-08-20 DIAGNOSIS — Z85528 Personal history of other malignant neoplasm of kidney: Secondary | ICD-10-CM | POA: Diagnosis not present

## 2024-08-20 DIAGNOSIS — Z8546 Personal history of malignant neoplasm of prostate: Secondary | ICD-10-CM | POA: Insufficient documentation

## 2024-08-20 DIAGNOSIS — N2889 Other specified disorders of kidney and ureter: Secondary | ICD-10-CM | POA: Diagnosis present

## 2024-08-20 DIAGNOSIS — E119 Type 2 diabetes mellitus without complications: Secondary | ICD-10-CM

## 2024-08-20 DIAGNOSIS — Z87891 Personal history of nicotine dependence: Secondary | ICD-10-CM

## 2024-08-20 DIAGNOSIS — E785 Hyperlipidemia, unspecified: Secondary | ICD-10-CM | POA: Insufficient documentation

## 2024-08-20 DIAGNOSIS — Z905 Acquired absence of kidney: Secondary | ICD-10-CM | POA: Diagnosis not present

## 2024-08-20 DIAGNOSIS — E1122 Type 2 diabetes mellitus with diabetic chronic kidney disease: Secondary | ICD-10-CM | POA: Insufficient documentation

## 2024-08-20 DIAGNOSIS — N189 Chronic kidney disease, unspecified: Secondary | ICD-10-CM | POA: Diagnosis not present

## 2024-08-20 DIAGNOSIS — I129 Hypertensive chronic kidney disease with stage 1 through stage 4 chronic kidney disease, or unspecified chronic kidney disease: Principal | ICD-10-CM | POA: Insufficient documentation

## 2024-08-20 DIAGNOSIS — F1090 Alcohol use, unspecified, uncomplicated: Secondary | ICD-10-CM | POA: Diagnosis not present

## 2024-08-20 DIAGNOSIS — I10 Essential (primary) hypertension: Secondary | ICD-10-CM

## 2024-08-20 DIAGNOSIS — N12 Tubulo-interstitial nephritis, not specified as acute or chronic: Secondary | ICD-10-CM | POA: Diagnosis not present

## 2024-08-20 DIAGNOSIS — Z955 Presence of coronary angioplasty implant and graft: Secondary | ICD-10-CM | POA: Diagnosis not present

## 2024-08-20 DIAGNOSIS — Z79899 Other long term (current) drug therapy: Secondary | ICD-10-CM | POA: Insufficient documentation

## 2024-08-20 DIAGNOSIS — G4733 Obstructive sleep apnea (adult) (pediatric): Secondary | ICD-10-CM | POA: Diagnosis not present

## 2024-08-20 DIAGNOSIS — Z794 Long term (current) use of insulin: Secondary | ICD-10-CM | POA: Diagnosis not present

## 2024-08-20 DIAGNOSIS — N269 Renal sclerosis, unspecified: Secondary | ICD-10-CM | POA: Diagnosis not present

## 2024-08-20 DIAGNOSIS — Z01818 Encounter for other preprocedural examination: Secondary | ICD-10-CM

## 2024-08-20 LAB — CBC
HCT: 35.9 % — ABNORMAL LOW (ref 39.0–52.0)
Hemoglobin: 11.3 g/dL — ABNORMAL LOW (ref 13.0–17.0)
MCH: 28.8 pg (ref 26.0–34.0)
MCHC: 31.5 g/dL (ref 30.0–36.0)
MCV: 91.3 fL (ref 80.0–100.0)
Platelets: 181 K/uL (ref 150–400)
RBC: 3.93 MIL/uL — ABNORMAL LOW (ref 4.22–5.81)
RDW: 14.6 % (ref 11.5–15.5)
WBC: 6.5 K/uL (ref 4.0–10.5)
nRBC: 0 % (ref 0.0–0.2)

## 2024-08-20 LAB — GLUCOSE, CAPILLARY
Glucose-Capillary: 111 mg/dL — ABNORMAL HIGH (ref 70–99)
Glucose-Capillary: 91 mg/dL (ref 70–99)
Glucose-Capillary: 103 mg/dL — ABNORMAL HIGH (ref 70–99)

## 2024-08-20 LAB — BASIC METABOLIC PANEL WITH GFR
Anion gap: 13 (ref 5–15)
BUN: 39 mg/dL — ABNORMAL HIGH (ref 8–23)
CO2: 23 mmol/L (ref 22–32)
Calcium: 9.6 mg/dL (ref 8.9–10.3)
Chloride: 107 mmol/L (ref 98–111)
Creatinine, Ser: 1.65 mg/dL — ABNORMAL HIGH (ref 0.61–1.24)
GFR, Estimated: 42 mL/min — ABNORMAL LOW (ref >60)
Glucose, Bld: 106 mg/dL — ABNORMAL HIGH (ref 70–99)
Potassium: 4 mmol/L (ref 3.5–5.1)
Sodium: 142 mmol/L (ref 135–145)

## 2024-08-20 LAB — PROTIME-INR
INR: 1.1 (ref 0.8–1.2)
Prothrombin Time: 14.3 s (ref 11.4–15.2)

## 2024-08-20 SURGERY — CT WITH ANESTHESIA
Anesthesia: General | Laterality: Right

## 2024-08-20 MED ORDER — ROCURONIUM BROMIDE 100 MG/10ML IV SOLN
INTRAVENOUS | Status: DC | PRN
  Administered 2024-08-20: 60 mg via INTRAVENOUS

## 2024-08-20 MED ORDER — ACETAMINOPHEN 500 MG PO TABS
ORAL_TABLET | ORAL | Status: AC
  Administered 2024-08-20: 1000 mg
  Filled 2024-08-20: qty 2

## 2024-08-20 MED ORDER — AMISULPRIDE (ANTIEMETIC) 5 MG/2ML IV SOLN: 10.0000 mg | Freq: Once | INTRAVENOUS | Status: DC | PRN

## 2024-08-20 MED ORDER — DEXAMETHASONE SOD PHOSPHATE PF 10 MG/ML IJ SOLN
INTRAMUSCULAR | Status: DC | PRN
  Administered 2024-08-20: 8 mg via INTRAVENOUS

## 2024-08-20 MED ORDER — OXYCODONE HCL 5 MG PO TABS
ORAL_TABLET | ORAL | Status: AC
  Filled 2024-08-20: qty 1

## 2024-08-20 MED ORDER — SODIUM CHLORIDE 0.9 % IV SOLN: INTRAVENOUS | Status: DC

## 2024-08-20 MED ORDER — ONDANSETRON HCL 4 MG/2ML IJ SOLN
INTRAMUSCULAR | Status: DC | PRN
  Administered 2024-08-20: 4 mg via INTRAVENOUS

## 2024-08-20 MED ORDER — ORAL CARE MOUTH RINSE: 15.0000 mL | Freq: Once | OROMUCOSAL | Status: AC

## 2024-08-20 MED ORDER — INSULIN ASPART 100 UNIT/ML IJ SOLN: 0.0000 [IU] | INTRAMUSCULAR | Status: DC | PRN

## 2024-08-20 MED ORDER — SUGAMMADEX SODIUM 200 MG/2ML IV SOLN
INTRAVENOUS | Status: DC | PRN
Start: 2024-08-20 — End: 2024-08-20
  Administered 2024-08-20: 200 mg via INTRAVENOUS

## 2024-08-20 MED ORDER — OXYCODONE HCL 5 MG PO TABS
5.0000 mg | ORAL_TABLET | Freq: Once | ORAL | Status: AC | PRN
  Administered 2024-08-20: 5 mg via ORAL

## 2024-08-20 MED ORDER — FENTANYL CITRATE (PF) 250 MCG/5ML IJ SOLN
INTRAMUSCULAR | Status: AC
  Filled 2024-08-20: qty 5

## 2024-08-20 MED ORDER — EPHEDRINE SULFATE-NACL 50-0.9 MG/10ML-% IV SOSY
PREFILLED_SYRINGE | INTRAVENOUS | Status: DC | PRN
  Administered 2024-08-20: 5 mg via INTRAVENOUS

## 2024-08-20 MED ORDER — PHENYLEPHRINE HCL-NACL 20-0.9 MG/250ML-% IV SOLN
INTRAVENOUS | Status: DC | PRN
  Administered 2024-08-20: 40 ug/min via INTRAVENOUS

## 2024-08-20 MED ORDER — SODIUM CHLORIDE 0.9 % IV SOLN
INTRAVENOUS | Status: AC
  Filled 2024-08-20: qty 250

## 2024-08-20 MED ORDER — ACETAMINOPHEN 500 MG PO TABS
ORAL_TABLET | ORAL | Status: AC
  Filled 2024-08-20: qty 2

## 2024-08-20 MED ORDER — CHLORHEXIDINE GLUCONATE CLOTH 2 % EX PADS: 6.0000 | MEDICATED_PAD | Freq: Every day | CUTANEOUS | Status: DC

## 2024-08-20 MED ORDER — CEFAZOLIN SODIUM-DEXTROSE 1-4 GM/50ML-% IV SOLN
1.0000 g | Freq: Once | INTRAVENOUS | Status: DC
  Filled 2024-08-20: qty 50

## 2024-08-20 MED ORDER — FENTANYL CITRATE (PF) 100 MCG/2ML IJ SOLN
INTRAMUSCULAR | Status: DC | PRN
  Administered 2024-08-20 (×2): 50 ug via INTRAVENOUS

## 2024-08-20 MED ORDER — LACTATED RINGERS IV SOLN: INTRAVENOUS | Status: DC

## 2024-08-20 MED ORDER — LIDOCAINE HCL (CARDIAC) PF 100 MG/5ML IV SOSY
PREFILLED_SYRINGE | INTRAVENOUS | Status: DC | PRN
  Administered 2024-08-20: 100 mg via INTRAVENOUS

## 2024-08-20 MED ORDER — CHLORHEXIDINE GLUCONATE 0.12 % MT SOLN
15.0000 mL | Freq: Once | OROMUCOSAL | Status: AC
Start: 2024-08-20 — End: 2024-08-20
  Administered 2024-08-20: 15 mL via OROMUCOSAL

## 2024-08-20 MED ORDER — OXYCODONE HCL 5 MG/5ML PO SOLN: 5.0000 mg | Freq: Once | ORAL | Status: AC | PRN

## 2024-08-20 MED ORDER — PHENYLEPHRINE 80 MCG/ML (10ML) SYRINGE FOR IV PUSH (FOR BLOOD PRESSURE SUPPORT)
PREFILLED_SYRINGE | INTRAVENOUS | Status: DC | PRN
Start: 2024-08-20 — End: 2024-08-20
  Administered 2024-08-20: 80 ug via INTRAVENOUS

## 2024-08-20 MED ORDER — CEFAZOLIN SODIUM-DEXTROSE 3-4 GM/150ML-% IV SOLN
3.0000 g | INTRAVENOUS | Status: DC
  Administered 2024-08-20: 3 g via INTRAVENOUS
  Filled 2024-08-20 (×2): qty 150

## 2024-08-20 MED ORDER — CEFAZOLIN SODIUM-DEXTROSE 2-4 GM/100ML-% IV SOLN
INTRAVENOUS | Status: AC
  Filled 2024-08-20: qty 100

## 2024-08-20 MED ORDER — ACETAMINOPHEN 500 MG PO TABS
1000.0000 mg | ORAL_TABLET | Freq: Once | ORAL | Status: AC
  Administered 2024-08-20: 1000 mg via ORAL

## 2024-08-20 MED ORDER — PROPOFOL 10 MG/ML IV BOLUS
INTRAVENOUS | Status: DC | PRN
  Administered 2024-08-20: 150 mg via INTRAVENOUS

## 2024-08-20 MED ORDER — FENTANYL CITRATE (PF) 50 MCG/ML IJ SOSY: 25.0000 ug | PREFILLED_SYRINGE | INTRAMUSCULAR | Status: DC | PRN

## 2024-08-20 NOTE — Procedures (Signed)
 Interventional Radiology Procedure Note  Procedure: US  and CT Guided Biopsy of right kidney  Complications: None  Estimated Blood Loss: < 10 mL  Findings:  Definable right renal mass to treat with ablation not visible by CT or US . Biopsy therefore performed of upper pole of right kidney in region of abnormal enhancement seen by prior MRI. No ablation performed today. Will follow up on biopsy results with patient.  18 G core biopsy of upper pole of right kidney performed under US  and CT guidance.  Two core samples obtained and sent to Pathology.  Plan: 2 hour bedrest; discharge from PACU.  Jacob Berger. Luverne, M.D Pager:  [phone]

## 2024-08-20 NOTE — Anesthesia Procedure Notes (Signed)
 Procedure Name: Intubation Date/Time: 08/20/2024 12:57 PM  Performed by: Deeann Eva BROCKS, CRNAPre-anesthesia Checklist: Patient identified, Emergency Drugs available, Suction available and Patient being monitored Patient Re-evaluated:Patient Re-evaluated prior to induction Oxygen Delivery Method: Circle System Utilized Preoxygenation: Pre-oxygenation with 100% oxygen Induction Type: IV induction Ventilation: Mask ventilation without difficulty Laryngoscope Size: Mac and 3 Grade View: Grade II Tube type: Oral Tube size: 8.0 mm Number of attempts: 1 Airway Equipment and Method: Stylet Placement Confirmation: ETT inserted through vocal cords under direct vision, positive ETCO2 and breath sounds checked- equal and bilateral Secured at: 23 cm Tube secured with: Tape Dental Injury: Teeth and Oropharynx as per pre-operative assessment

## 2024-08-20 NOTE — Anesthesia Postprocedure Evaluation (Signed)
 Anesthesia Post Note  Patient: Jacob Berger  Procedure(s) Performed: CT WITH ANESTHESIA (Right)     Patient location during evaluation: PACU Anesthesia Type: General Level of consciousness: awake Pain management: pain level controlled Vital Signs Assessment: post-procedure vital signs reviewed and stable Respiratory status: spontaneous breathing, nonlabored ventilation and respiratory function stable Cardiovascular status: blood pressure returned to baseline and stable Postop Assessment: no apparent nausea or vomiting Anesthetic complications: no   No notable events documented.  Last Vitals:  Vitals:   08/20/24 1715 08/20/24 1730  BP: (!) 145/65 (!) 150/64  Pulse: (!) 56   Resp:    Temp:    SpO2: 99%     Last Pain:  Vitals:   08/20/24 1851  TempSrc:   PainSc: 2                  Delon Aisha Arch

## 2024-08-20 NOTE — Transfer of Care (Signed)
 Immediate Anesthesia Transfer of Care Note  Patient: Jacob Berger  Procedure(s) Performed: CT WITH ANESTHESIA (Right)  Patient Location: PACU  Anesthesia Type:General  Level of Consciousness: drowsy  Airway & Oxygen Therapy: Patient Spontanous Breathing and Patient connected to face mask oxygen  Post-op Assessment: Report given to RN and Post -op Vital signs reviewed and stable  Post vital signs: Reviewed and stable  Last Vitals:  Vitals Value Taken Time  BP 155/67 08/20/24 15:15  Temp    Pulse 74 08/20/24 15:18  Resp 12 08/20/24 15:18  SpO2 97 % 08/20/24 15:18  Vitals shown include unfiled device data.  Last Pain:  Vitals:   08/20/24 1029  TempSrc: Oral         Complications: No notable events documented.

## 2024-08-21 ENCOUNTER — Encounter (HOSPITAL_COMMUNITY): Payer: Self-pay | Admitting: Radiology

## 2024-08-21 LAB — SURGICAL PATHOLOGY

## 2024-08-27 ENCOUNTER — Other Ambulatory Visit: Payer: Self-pay | Admitting: Interventional Radiology

## 2024-08-27 DIAGNOSIS — N2889 Other specified disorders of kidney and ureter: Secondary | ICD-10-CM

## 2024-09-04 ENCOUNTER — Ambulatory Visit
Admission: RE | Admit: 2024-09-04 | Discharge: 2024-09-04 | Disposition: A | Discharge status: Home / Self Care | Source: Ambulatory Visit | Attending: Interventional Radiology | Admitting: Interventional Radiology

## 2024-09-04 DIAGNOSIS — N2889 Other specified disorders of kidney and ureter: Secondary | ICD-10-CM

## 2024-09-04 NOTE — Progress Notes (Signed)
 Chief Complaint: Patient was seen in consultation today for possible right renal mass, status post biopsy.  History of Present Illness: Jacob Berger is a 78 y.o. male status post left nephrectomy on 04/13/2016 for multiple clear cell renal carcinomas, the largest measuring 3 cm. Margins were negative. MRI in 2018 and serial renal ultrasound studies were negative for recurrence or right renal lesions until recently this year, demonstrating a complex cystic lesion of the right kidney. MRI on 06/16/24 demonstrated an enhancing solid mass of the upper pole of the right kidney estimated at 3.6 x 2.3 x 2.0 cm in size and suspicious for renal carcinoma.   He was set up for right renal cryoablation on 08/20/24. At the time of the procedure, unenhanced CT as well as ultrasound did not demonstrate a discrete mass along the upper pole of the right kidney corresponding to the area of abnormal enhancement seen on prior MRI. IV contrast could not be administered due to underlying renal insufficiency and chronic kidney disease. As a focal target was difficult to ascertain for cryoablation, decision was made to perform a biopsy only of the upper pole targeting the region of abnormal enhancement seen by prior MRI. He was sent home after biopsy and ablation was not performed. He has done well after biopsy with some midline low back pain when bending over to reach his shoes. He denies right flank pain, hematuria, fever, abdominal pain or urinary symptoms after biopsy.  Past Medical History:  Diagnosis Date   Arthritis    knees (05/10/2016)   CAD S/P percutaneous coronary angioplasty 2011   a. 2011: s/p PCI to PDA with Endeavor 2.5 mm x 12 mm DES - [facility] b. low-risk NST in 07/2015   Cancer of kidney [facility])    Chronic kidney disease    Daily headache    recently (05/10/2016)   Dyslipidemia, goal LDL below 70    Glaucoma    Gout    Heart murmur    History of kidney stones     Hypertension, essential    Obesity, Class II, BMI 35-39.9, with comorbidity    BMI 37   OSA on CPAP    PONV (postoperative nausea and vomiting)    after colonoscopy   10-15 yr ago per pt.   Prostate cancer (HCC) 2007   Rash since 04-08-16   on neck healing, right arm improving   Thoracic aortic aneurysm without rupture (HCC) - Noted on chest CT. 4.5 cm 02/15/2016   4.5 cm per dr lucas note and [facility] chest ct 01-18-16   Type II diabetes mellitus [facility])     Past Surgical History:  Procedure Laterality Date   ANTERIOR CERVICAL DECOMP/DISCECTOMY FUSION N/A 11/09/2017   Procedure: Cervical four-five, Cervical five-six Anterior discectomy with fusion and plate fixation;  Surgeon: Ditty, Morene Hicks, MD;  Location: [facility] OR;  Service: Neurosurgery;  Laterality: N/A;   COLONOSCOPY W/ BIOPSIES AND POLYPECTOMY  several   CORONARY ANGIOPLASTY WITH STENT PLACEMENT  2011   [place] : Endeavor 2.5 mm x 12 mm DES - distal PDA ([facility])   EYE SURGERY     glaucoma and cataract   IR RADIOLOGIST EVAL & MGMT  07/09/2024   LAPAROSCOPIC NEPHRECTOMY Left 04/13/2016   Procedure: LAPAROSCOPIC RADICAL LEFT NEPHRECTOMY;  Surgeon: Gretel Ferrara, MD;  Location: [facility] ORS;  Service: Urology;  Laterality: Left;   NM MYOVIEW  ([facility] HX)  6/'13, 3/'16   a. Treadmill Myoview : 10 minutes, 12  METS; no ischemia infarction, EF 65%; b. [facility]: LOW RISK. EF 57%. 10.4 METS low risk scan no ischemia. No wall motion abnormality.    NM MYOVIEW  LTD  05/2017   EF 50%. Normal function. No ischemia or infarction.   PROSTATECTOMY     RADIOLOGY WITH ANESTHESIA Right 08/20/2024   Procedure: CT WITH ANESTHESIA;  Surgeon: Radiologist, Medication, MD;  Location: [facility] ORS;  Service: Radiology;  Laterality: Right;  CT CRYOABLATION RIGHT RENAL MASS   REFRACTIVE SURGERY Left    TOTAL KNEE ARTHROPLASTY Left 05/27/2019   Procedure: Left Knee Arthroplasty;  Surgeon: Sheril Coy, MD;  Location: [facility] ORS;  Service: Orthopedics;   Laterality: Left;   TRANSTHORACIC ECHOCARDIOGRAM  05/2016   EF 60 to 65%.  No R WMA.  GR 1 DD.  Normal valves.    Allergies: Accupril [quinapril hcl]  Medications: Prior to Admission medications   Medication Sig Start Date End Date Taking? Authorizing Provider  allopurinol  (ZYLOPRIM ) 100 MG tablet Take 100 mg by mouth daily.    [provider]  aspirin  EC 81 MG tablet Take 81 mg by mouth daily.    [provider]  AZOR  10-40 MG per tablet TAKE 1 TABLET BY MOUTH DAILY 12/18/13   Anner Alm ORN, MD  bimatoprost (LUMIGAN) 0.01 % SOLN Place 1 drop into both eyes at bedtime.    [provider]  Carboxymethylcellulose Sodium (REFRESH LIQUIGEL) 1 % GEL Place 1 drop into both eyes 4 (four) times daily as needed (eye irritation).    [provider]  diphenhydramine -acetaminophen  (TYLENOL  PM) 25-500 MG TABS tablet Take 1 tablet by mouth at bedtime as needed (sleep).    [provider]  Dulaglutide  (TRULICITY ) 1.5 MG/0.5ML SOAJ Inject 1.5 mg into the skin once a week.    [provider]  fenofibrate  (TRICOR ) 145 MG tablet Take 145 mg by mouth daily.    [provider]  furosemide  (LASIX ) 80 MG tablet Take 20-80 mg by mouth daily as needed for fluid or edema.    [provider]  LANTUS SOLOSTAR 100 UNIT/ML Solostar Pen Inject 18-20 Units into the skin at bedtime. 05/30/24   [provider]  Methylcellulose, Laxative, (CITRUCEL) 500 MG TABS Take 500 mg by mouth daily.    [provider]  nitroGLYCERIN  (NITROSTAT ) 0.4 MG SL tablet Place 1 tablet (0.4 mg total) under the tongue every 5 (five) minutes as needed for chest pain. 07/25/24 10/23/24  Anner Alm ORN, MD  NOVOLOG  FLEXPEN 100 UNIT/ML FlexPen Inject 18-20 Units into the skin 3 (three) times daily before meals. 08/10/16   [provider]  ofloxacin (OCUFLOX) 0.3 % ophthalmic solution Place 1 drop into the left eye at bedtime. 07/22/24   [provider]  olmesartan  (BENICAR ) 20 MG tablet Take 20 mg by mouth daily. 07/29/24   [provider]  sildenafil  (VIAGRA ) 50 MG tablet Take 1 tablet (total 50 mg)  to 1.5 tablet ( total 75 mg) as needed for erectile dysfunction 03/29/20   Anner Alm ORN, MD  timolol  (TIMOPTIC ) 0.5 % ophthalmic solution Place 1 drop into both eyes 2 (two) times daily. Morning and afternoon 10/01/17   [provider]  TURMERIC CURCUMIN PO Take 1 each by mouth daily.    [provider]  VYTORIN  10-40 MG per tablet Take 1 tablet by mouth daily. 04/27/15   [provider]     Family History  Problem Relation Age of Onset   Hypertension Son  Social History   Socioeconomic History   Marital status: Married    Spouse name: Not on file   Number of children: Not on file   Years of education: Not on file   Highest education level: Not on file  Occupational History   Not on file  Tobacco Use   Smoking status: Former    Current packs/day: 0.00    Types: Cigarettes    Quit date: 06/04/1993    Years since quitting: 31.2   Smokeless tobacco: Never   Tobacco comments:    A PK WOULD LAST A WEEK  Vaping Use   Vaping status: Never Used  Substance and Sexual Activity   Alcohol use: Yes    Alcohol/week: 2.0 standard drinks of alcohol    Types: 1 Glasses of wine, 1 Cans of beer per week    Comment: rare   Drug use: No   Sexual activity: Not Currently  Other Topics Concern   Not on file  Social History Narrative   Married father of 3. Had been walking up to 2 miles every other day appetite is good his weight for about half an hour time period, but limited now due to back pain. Occasional alcohol. No tobacco products -- he quit about 20-30 years ago.   Social Drivers of Corporate Investment Banker Strain: Low Risk  (05/06/2019)   [facility]   Overall Financial Resource Strain (CARDIA)    Difficulty of Paying Living Expenses: Not hard at all  Food  Insecurity: Unknown (05/06/2019)   Received from [facility]   Hunger Vital Sign    Within the past 12 months, you worried that your food would run out before you got the money to buy more.: Patient declined    Within the past 12 months, the food you bought just didn't last and you didn't have money to get more.: Patient declined  Transportation Needs: Unknown (05/06/2019)   Received from [facility]   PRAPARE - Transportation    Lack of Transportation (Medical): Patient declined    Lack of Transportation (Non-Medical): Patient declined  Physical Activity: Not on file  Stress: Not on file  Social Connections: Not on file    ECOG Status: 0 - Asymptomatic  Review of Systems: A 12 point ROS discussed and pertinent positives are indicated in the HPI above.  All other systems are negative.  Review of Systems  Vital Signs: BP (!) 157/73 (BP Location: Left Arm, Patient Position: Sitting, Cuff Size: Large)   Pulse 62   Temp 97.6 F (36.4 C) (Oral)   Resp 16   SpO2 96%     Imaging: CT RENAL BIOPSY Result Date: 08/20/2024 INDICATION: History of left nephrectomy in 2017 for multiple clear cell carcinomas. New area abnormal enhancement along the superior right kidney suspicious for neoplasm by MRI. The patient presents for planned biopsy and cryoablation. EXAM: CT AND ULTRASOUND-GUIDED BIOPSY OF RIGHT KIDNEY TECHNIQUE: Multidetector CT imaging of the abdomen was performed following the standard protocol without IV contrast. Ultrasound was also utilized for the procedure. RADIATION DOSE REDUCTION: This exam was performed according to the departmental dose-optimization program which includes automated exposure control, adjustment of the mA and/or kV according to patient size and/or use of iterative reconstruction technique. MEDICATIONS: 3 g IV Ancef  ANESTHESIA/SEDATION: General COMPLICATIONS: None immediate. PROCEDURE: Informed written consent was obtained from the patient after a thorough  discussion of the procedural risks, benefits and alternatives. All questions were addressed. Maximal Sterile  Barrier Technique was utilized including caps, mask, sterile gowns, sterile gloves, sterile drape, hand hygiene and skin antiseptic. A timeout was performed prior to the initiation of the procedure. The patient was placed in a prone position under general anesthesia in anticipation performing percutaneous cryoablation of a right renal mass. Initial CT and ultrasound evaluation of the right kidney was performed. A 17 gauge trocar needle was advanced to the upper pole of the right kidney under ultrasound guidance. Two separate coaxial 18 gauge core biopsy samples were then obtained. Samples were submitted in formalin. A slurry of Gel-Foam pledgets was then injected via the outer needle as the needle was retracted and removed. Additional CT was performed. FINDINGS: Initial unenhanced CT as well as ultrasound does not demonstrate a discrete mass along the upper pole of the right kidney corresponding to the area of abnormal enhancement seen on prior MRI. IV contrast could not be administered due to underlying renal insufficiency and chronic kidney disease. As a focal target was difficult to ascertain for cryoablation, decision was made to perform a biopsy only at this time of the upper pole targeting the region of abnormal enhancement seen by prior MRI. IMPRESSION: Discrete mass not identified along the upper pole of the right kidney by unenhanced CT or ultrasound. Decision made to perform biopsy only today under image guidance at the level abnormal enhancement seen previously by MRI. Cryoablation was not performed today. Follow-up will be performed after biopsy results are finalized. Electronically Signed   By: Marcey Moan M.D.   On: 08/20/2024 16:17   ECHOCARDIOGRAM COMPLETE Result Date: 08/14/2024    ECHOCARDIOGRAM REPORT   Patient Name:   Jacob Berger Date of Exam: 08/14/2024 Medical Rec #:   [id number]           Height:       72.0 in Accession #:    [id number]          Weight:       271.0 lb Date of Birth:  10-26-1946           BSA:          2.424 m Patient Age:    77 years            BP:           133/84 mmHg Patient Gender: M                   HR:           59 bpm. Exam Location:  [address] Procedure: 2D Echo, 3D Echo, Cardiac Doppler and Color Doppler (Both Spectral            and Color Flow Doppler were utilized during procedure). Indications:    R94.39 Abnormal Stress Testing                 R94.31 Abnormal EKG  History:        Patient has prior history of Echocardiogram examinations, most                 recent 05/10/2016. CAD, Abnormal ECG, Signs/Symptoms:Murmur; Risk                 Factors:Sleep Apnea, Dyslipidemia, Hypertension, Former Smoker                 and Diabetes. Ascending Aortic Aneurysm (Prior 4.5cm by CT),                 Chronic Kidney  Disease, Pre-Operative Eval for Renal Surgery,                 Obesity.  Sonographer:    Heather Hawks RDCS Referring Phys: JACKEE DEL DICK IMPRESSIONS  1. Left ventricular ejection fraction, by estimation, is 60 to 65%. Left ventricular ejection fraction by 3D volume is 66 %. The left ventricle has normal function. The left ventricle has no regional wall motion abnormalities. Left ventricular diastolic  parameters were normal.  2. Right ventricular systolic function is normal. The right ventricular size is normal.  3. The mitral valve is normal in structure. No evidence of mitral valve regurgitation. No evidence of mitral stenosis.  4. The aortic valve is tricuspid. Aortic valve regurgitation is mild. No aortic stenosis is present.  5. 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  6. The inferior vena cava is dilated in size with <50% respiratory variability, suggesting right atrial pressure of 15 mmHg. FINDINGS  Left Ventricle: Left ventricular ejection fraction, by estimation, is 60 to 65%. Left ventricular ejection  fraction by 3D volume is 66 %. The left ventricle has normal function. The left ventricle has no regional wall motion abnormalities. The left ventricular internal cavity size was normal in size. There is no left ventricular hypertrophy. Left ventricular diastolic parameters were normal. Right Ventricle: The right ventricular size is normal. No increase in right ventricular wall thickness. Right ventricular systolic function is normal. Left Atrium: Left atrial size was normal in size. Right Atrium: Right atrial size was normal in size. Pericardium: There is no evidence of pericardial effusion. Mitral Valve: The mitral valve is normal in structure. No evidence of mitral valve regurgitation. No evidence of mitral valve stenosis. Tricuspid Valve: The tricuspid valve is normal in structure. Tricuspid valve regurgitation is mild . No evidence of tricuspid stenosis. Aortic Valve: The aortic valve is tricuspid. Aortic valve regurgitation is mild. Aortic regurgitation PHT measures 603 msec. No aortic stenosis is present. Aortic valve mean gradient measures 7.0 mmHg. Aortic valve peak gradient measures 12.7 mmHg. Aortic valve area, by VTI measures 3.47 cm. Pulmonic Valve: The pulmonic valve was grossly normal. Pulmonic valve regurgitation is not visualized. No evidence of pulmonic stenosis. Aorta: The aortic root is normal in size and structure. 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 Venous: The inferior vena cava is dilated in size with less than 50% respiratory variability, suggesting right atrial pressure of 15 mmHg. IAS/Shunts: No atrial level shunt detected by color flow Doppler. Additional Comments: 3D was performed not requiring image post processing on an independent workstation and was normal.  LEFT VENTRICLE PLAX 2D LVIDd:         4.60 cm         Diastology LVIDs:         2.50 cm         LV e' medial:    6.14 cm/s LV PW:         1.20 cm         LV E/e' medial:  13.5 LV IVS:        1.30  cm         LV e' lateral:   7.51 cm/s LVOT diam:     2.30 cm         LV E/e' lateral: 11.0 LV SV:         149 LV SV Index:   62 LVOT Area:     4.15 cm  3D Volume EF LV IVRT:       81 msec         LV 3D EF:    Left                                             ventricul                                             ar                                             ejection                                             fraction                                             by 3D                                             volume is                                             66 %.                                 3D Volume EF:                                3D EF:        66 %                                LV EDV:       129 ml                                LV ESV:       44 ml                                LV SV:        85 ml RIGHT VENTRICLE RV Basal diam:  3.40 cm     PULMONARY VEINS RV S prime:     15.50 cm/s  Diastolic Velocity: 34.60 cm/s TAPSE (M-mode): 2.2 cm      S/D Velocity:       1.60 RVSP:  31.7 mmHg   Systolic Velocity:  56.20 cm/s LEFT ATRIUM            Index        RIGHT ATRIUM           Index LA diam:      4.60 cm  1.90 cm/m   RA Pressure: 3.00 mmHg LA Vol (A2C): 103.0 ml 42.49 ml/m  RA Area:     19.20 cm LA Vol (A4C): 47.1 ml  19.43 ml/m  RA Volume:   56.40 ml  23.27 ml/m  AORTIC VALVE AV Area (Vmax):    3.62 cm AV Area (Vmean):   3.33 cm AV Area (VTI):     3.47 cm AV Vmax:           178.00 cm/s AV Vmean:          123.000 cm/s AV VTI:            0.430 m AV Peak Grad:      12.7 mmHg AV Mean Grad:      7.0 mmHg LVOT Vmax:         155.00 cm/s LVOT Vmean:        98.500 cm/s LVOT VTI:          0.360 m LVOT/AV VTI ratio: 0.84 AI PHT:            603 msec  AORTA Ao Root diam: 3.80 cm Ao Asc diam:  5.40 cm MITRAL VALVE               TRICUSPID VALVE MV Area (PHT): cm         TR Peak grad:   28.7 mmHg MV Decel Time: 284 msec    TR Vmax:        268.00 cm/s MV E velocity: 82.80 cm/s  Estimated RAP:   3.00 mmHg MV A velocity: 94.50 cm/s  RVSP:           31.7 mmHg MV E/A ratio:  0.88                            SHUNTS                            Systemic VTI:  0.36 m                            Systemic Diam: 2.30 cm Joelle Azobou Tonleu Electronically signed by Joelle Cedars Tonleu Signature Date/Time: 08/14/2024/1:40:47 PM    Final    DG Chest 1 View Result Date: 08/13/2024 CLINICAL DATA:  Preop, right renal mass. EXAM: CHEST  1 VIEW COMPARISON:  Radiograph 05/23/2019, CT 07/03/2024 FINDINGS: The cardiomediastinal contours are stable. Aortic atherosclerosis and tortuosity. The lungs are clear. Pulmonary vasculature is normal. No consolidation, pleural effusion, or pneumothorax. No acute osseous abnormalities are seen. IMPRESSION: No active disease. Electronically Signed   By: Andrea Gasman M.D.   On: 08/13/2024 18:01   MYOCARDIAL PERFUSION/CT RAD READ Result Date: 08/12/2024 CLINICAL DATA:  This over-read does not include interpretation of cardiac or coronary anatomy or pathology. The cardiac SPECT CT interpretation by the cardiologist is attached. COMPARISON:  CT May 07, 2023 MRI June 16, 2024 FINDINGS: Vascular: Aortic atherosclerosis. Dilation of the ascending thoracic aorta measuring 4.6 cm, unchanged. Three-vessel coronary artery calcifications/stents. Mediastinum/Nodes: No pathologically enlarged mediastinal lymph nodes. Lungs/Pleura: Motion  degraded examination of the lungs. Upper Abdomen: Known right renal cell carcinoma better seen on MRI June 16, 2024. Musculoskeletal: Thoracic spondylosis. IMPRESSION: 1. Dilation of the ascending thoracic aorta measuring 4.6 cm, unchanged. 2. Known right renal cell carcinoma better seen on MRI June 16, 2024. 3. Aortic atherosclerosis. Aortic Atherosclerosis (ICD10-I70.0). Electronically Signed   By: Reyes Holder M.D.   On: 08/12/2024 12:33   MYOCARDIAL PERFUSION IMAGING Result Date: 08/07/2024   Stress ECG is diagnostic due to baseline ST-T changes and  pharmacological stress protocol.   LV perfusion is abnormal. Defect 1: There is a small defect with mild reduction in uptake present in the apical anterior and apex location(s) that is reversible. There is normal wall motion in the defect area. Consistent with soft tissue attenuation versus subtle ischemia in the distal LAD cannot be ruled out.   Left ventricular function is normal. Nuclear stress EF: 55%. The left ventricular ejection fraction is normal (55-65%). End diastolic cavity size is enlarged (EDV 130 cc)   Coronary calcium was present on the attenuation correction CT images. Severe coronary calcifications were present. Coronary calcifications were present in the left anterior descending artery, left circumflex artery and right coronary artery distribution(s).  Aortic atherosclerosis.   Prior study available for comparison. 2018, low risk study   The study is low risk. Small sized, mild intensity, reversible perfusion defect of apical anterior and apex, consider either distal LAD disease or soft tissue attenuation (BMI 37).  Wall motion in this segment is preserved.  No evidence of infarction.  LVEF is 55%, wall thickness preserved, no wall motion abnormalities noted. Overall low risk study.  Clinical correlation required.    Labs:  CBC: Recent Labs    08/13/24 0954 08/20/24 1140  WBC 6.8 6.5  HGB 12.3* 11.3*  HCT 39.7 35.9*  PLT 195 181    COAGS: Recent Labs    08/13/24 0954 08/20/24 1140  INR 1.0 1.1    BMP: Recent Labs    08/13/24 0954 08/20/24 1140  NA 138 142  K 4.1 4.0  CL 104 107  CO2 22 23  GLUCOSE 108* 106*  BUN 43* 39*  CALCIUM 10.0 9.6  CREATININE 2.03* 1.65*  GFRNONAA 33* 42*     Assessment and Plan:  Jacob Berger and his daughter Jacob Berger are here today to discuss biopsy results. Pathology demonstrates: Benign renal parenchyma with focal mild interstitial inflammation and rare sclerosed glomeruli (2/21). No evidence of neoplastic process and malignancy.    I told Jacob Berger that although there may certainly be sampling error, both CT and US  demonstrated the biopsy needle to be exactly in the region of increased enhancement in the upper pole seen by MRI. Both US  and unenhanced CT at the time of planned ablation did not show evidence of a discrete mass and no exophytic contour bulge.   I recommended continued surveillance of the MRI abnormality with a follow up MRI around March of next year. His GFR has been just high enough to receive Gadolinium for MRI evaluation with contrast.  Given that there is the possibility of performing future ablation on his solitary kidney and with underlying significant CKD, I did suggest that we refer Jacob Berger to [facility] for nephrology consultation as his CKD could conceivably worsen should he need ablation in the future. Either we could make that referral, or have Dr. Verdia place a referral. I will follow up with Jacob Berger after his follow up MRI next spring.   Electronically  Signed: Marcey ONEIDA Moan 09/04/2024, 11:42 AM    I spent a total of 15 Minutes in face to face in clinical consultation, greater than 50% of which was counseling/coordinating care for a possible right renal mass.

## 2024-09-09 NOTE — Discharge Summary (Signed)
 DISCHARGE SUMMARY:  History of Present Illness: Jacob Berger is a 78 y.o. 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 New area of abnormal enhancement along the superior right kidney suspicious for neoplasm by MRI. The patient presents for planned biopsy and cryoablation. After being placed under general anesthesia, CT and US  demonstrate no discrete mass along the upper pole of the right kidney. IV contrast could not be administered due to underlying renal insufficiency and chronic kidney disease. As a focal target was difficult to ascertain for cryoablation, decision was made to perform a biopsy only at this time of the upper pole targeting the region of abnormal enhancement seen by prior MRI. Two satisfactory core biopsy samples were obtained. Jacob Berger tolerated biopsy well without complication.  Past Medical History:  Diagnosis Date   Arthritis    knees (05/10/2016)   CAD S/P percutaneous coronary angioplasty 2011   a. 2011: s/p PCI to PDA with Endeavor 2.5 mm x 12 mm DES - [facility] b. low-risk NST in 07/2015   Cancer of kidney [facility])    Chronic kidney disease    Daily headache    recently (05/10/2016)   Dyslipidemia, goal LDL below 70    Glaucoma    Gout    Heart murmur    History of kidney stones    Hypertension, essential    Obesity, Class II, BMI 35-39.9, with comorbidity    BMI 37   OSA on CPAP    PONV (postoperative nausea and vomiting)    after colonoscopy   10-15 yr ago per pt.   Prostate cancer (HCC) 2007   Rash since 04-08-16   on neck healing, right arm improving   Thoracic aortic aneurysm without rupture (HCC) - Noted on chest CT. 4.5 cm 02/15/2016   4.5 cm per dr lucas note and [facility] chest ct 01-18-16   Type II diabetes mellitus [facility])     Past Surgical History:  Procedure Laterality Date   ANTERIOR CERVICAL DECOMP/DISCECTOMY FUSION N/A 11/09/2017   Procedure: Cervical four-five, Cervical  five-six Anterior discectomy with fusion and plate fixation;  Surgeon: Ditty, Morene Hicks, MD;  Location: [facility] OR;  Service: Neurosurgery;  Laterality: N/A;   COLONOSCOPY W/ BIOPSIES AND POLYPECTOMY  several   CORONARY ANGIOPLASTY WITH STENT PLACEMENT  2011   [place] : Endeavor 2.5 mm x 12 mm DES - distal PDA ([facility])   EYE SURGERY     glaucoma and cataract   IR RADIOLOGIST EVAL & MGMT  07/09/2024   IR RADIOLOGIST EVAL & MGMT  09/04/2024   LAPAROSCOPIC NEPHRECTOMY Left 04/13/2016   Procedure: LAPAROSCOPIC RADICAL LEFT NEPHRECTOMY;  Surgeon: Gretel Ferrara, MD;  Location: [facility] ORS;  Service: Urology;  Laterality: Left;   NM MYOVIEW  ([facility] HX)  6/'13, 3/'16   a. Treadmill Myoview : 10 minutes, 12 METS; no ischemia infarction, EF 65%; b. [facility]: LOW RISK. EF 57%. 10.4 METS low risk scan no ischemia. No wall motion abnormality.    NM MYOVIEW  LTD  05/2017   EF 50%. Normal function. No ischemia or infarction.   PROSTATECTOMY     RADIOLOGY WITH ANESTHESIA Right 08/20/2024   Procedure: CT WITH ANESTHESIA;  Surgeon: Radiologist, Medication, MD;  Location: [facility] ORS;  Service: Radiology;  Laterality: Right;  CT CRYOABLATION RIGHT RENAL MASS   REFRACTIVE SURGERY Left    TOTAL KNEE ARTHROPLASTY Left 05/27/2019   Procedure: Left Knee Arthroplasty;  Surgeon: Sheril Coy, MD;  Location: [facility] ORS;  Service: Orthopedics;  Laterality: Left;   TRANSTHORACIC ECHOCARDIOGRAM  05/2016   EF 60 to 65%.  No R WMA.  GR 1 DD.  Normal valves.    Allergies: Accupril [quinapril hcl]  Medications: Prior to Admission medications   Medication Sig Start Date End Date Taking? Authorizing Provider  allopurinol  (ZYLOPRIM ) 100 MG tablet Take 100 mg by mouth daily.   Yes [provider]  aspirin  EC 81 MG tablet Take 81 mg by mouth daily.   Yes [provider]  AZOR  10-40 MG per tablet TAKE 1 TABLET BY MOUTH DAILY 12/18/13  Yes Anner Alm ORN, MD  bimatoprost (LUMIGAN) 0.01 % SOLN Place  1 drop into both eyes at bedtime.   Yes [provider]  Carboxymethylcellulose Sodium (REFRESH LIQUIGEL) 1 % GEL Place 1 drop into both eyes 4 (four) times daily as needed (eye irritation).   Yes [provider]  diphenhydramine -acetaminophen  (TYLENOL  PM) 25-500 MG TABS tablet Take 1 tablet by mouth at bedtime as needed (sleep).   Yes [provider]  Dulaglutide  (TRULICITY ) 1.5 MG/0.5ML SOAJ Inject 1.5 mg into the skin once a week.   Yes [provider]  fenofibrate  (TRICOR ) 145 MG tablet Take 145 mg by mouth daily.   Yes [provider]  furosemide  (LASIX ) 80 MG tablet Take 20-80 mg by mouth daily as needed for fluid or edema.   Yes [provider]  LANTUS SOLOSTAR 100 UNIT/ML Solostar Pen Inject 18-20 Units into the skin at bedtime. 05/30/24  Yes [provider]  Methylcellulose, Laxative, (CITRUCEL) 500 MG TABS Take 500 mg by mouth daily.   Yes [provider]  nitroGLYCERIN  (NITROSTAT ) 0.4 MG SL tablet Place 1 tablet (0.4 mg total) under the tongue every 5 (five) minutes as needed for chest pain. 07/25/24 10/23/24 Yes Anner Alm ORN, MD  NOVOLOG  FLEXPEN 100 UNIT/ML FlexPen Inject 18-20 Units into the skin 3 (three) times daily before meals. 08/10/16  Yes [provider]  ofloxacin (OCUFLOX) 0.3 % ophthalmic solution Place 1 drop into the left eye at bedtime. 07/22/24  Yes [provider]  olmesartan  (BENICAR ) 20 MG tablet Take 20 mg by mouth daily. 07/29/24  Yes [provider]  timolol  (TIMOPTIC ) 0.5 % ophthalmic solution Place 1 drop into both eyes 2 (two) times daily. Morning and afternoon 10/01/17  Yes [provider]  TURMERIC CURCUMIN PO Take 1 each by mouth daily.   Yes [provider]  VYTORIN  10-40 MG per tablet Take 1 tablet by mouth daily. 04/27/15  Yes [provider]  sildenafil  (VIAGRA ) 50 MG tablet Take 1 tablet (total 50 mg)  to 1.5 tablet ( total 75 mg) as  needed for erectile dysfunction 03/29/20   Anner Alm ORN, MD     Family History  Problem Relation Age of Onset   Hypertension Son     Social History   Socioeconomic History   Marital status: Married    Spouse name: Not on file   Number of children: Not on file   Years of education: Not on file   Highest education level: Not on file  Occupational History   Not on file  Tobacco Use   Smoking status: Former    Current packs/day: 0.00    Types: Cigarettes    Quit date: 06/04/1993    Years since quitting: 31.2   Smokeless tobacco: Never   Tobacco comments:    A PK WOULD LAST A WEEK  Vaping Use  Vaping status: Never Used  Substance and Sexual Activity   Alcohol use: Yes    Alcohol/week: 2.0 standard drinks of alcohol    Types: 1 Glasses of wine, 1 Cans of beer per week    Comment: rare   Drug use: No   Sexual activity: Not Currently  Other Topics Concern   Not on file  Social History Narrative   Married father of 3. Had been walking up to 2 miles every other day appetite is good his weight for about half an hour time period, but limited now due to back pain. Occasional alcohol. No tobacco products -- he quit about 20-30 years ago.   Social Drivers of Corporate Investment Banker Strain: Low Risk  (05/06/2019)   Received [facility]   Overall Financial Resource Strain (CARDIA)    Difficulty of Paying Living Expenses: Not hard at all  Food Insecurity: Unknown (05/06/2019)   Received from [facility]   Hunger Vital Sign    Within the past 12 months, you worried that your food would run out before you got the money to buy more.: Patient declined    Within the past 12 months, the food you bought just didn't last and you didn't have money to get more.: Patient declined  Transportation Needs: Unknown (05/06/2019)   Received from [facility]   PRAPARE - Transportation    Lack of Transportation (Medical): Patient declined    Lack of Transportation  (Non-Medical): Patient declined  Physical Activity: Not on file  Stress: Not on file  Social Connections: Not on file    Review of Systems: A 12 point ROS discussed and pertinent positives are indicated in the HPI above.  All other systems are negative.  Review of Systems  Constitutional: Negative.   Respiratory: Negative.    Cardiovascular: Negative.   Gastrointestinal: Negative.   Genitourinary: Negative.   Musculoskeletal: Negative.   Neurological: Negative.     Vital Signs: BP (!) 150/64   Pulse (!) 56   Temp (!) 97.5 F (36.4 C) (Oral)   Resp 16   SpO2 99%    Physical Exam Vitals reviewed.  Constitutional:      General: He is not in acute distress.    Appearance: He is not ill-appearing, toxic-appearing or diaphoretic.  Abdominal:     General: There is no distension.     Palpations: Abdomen is soft.     Tenderness: There is no abdominal tenderness. There is no guarding or rebound.  Neurological:     General: No focal deficit present.     Mental Status: He is alert and oriented to person, place, and time.     Imaging: IR Radiologist Eval & Mgmt Result Date: 09/04/2024 CLINICAL DATA:  IR clinic follow up. EXAM: IR EVAL AND MANAGEMENT COMPARISON:  None Available. FINDINGS: See dictated note in Epic. IMPRESSION: See dictated note in Epic. Electronically Signed   By: Marcey Moan M.D.   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 New area abnormal enhancement along the superior right kidney suspicious for neoplasm by MRI. The patient presents for planned biopsy and cryoablation. EXAM: CT AND ULTRASOUND-GUIDED BIOPSY OF RIGHT KIDNEY TECHNIQUE: Multidetector CT imaging of the abdomen was performed following the standard protocol without IV contrast. Ultrasound was also utilized for the procedure. RADIATION DOSE REDUCTION: This exam was performed according to the  departmental dose-optimization program  which includes automated exposure control, adjustment of the mA and/or kV according to patient size and/or use of iterative reconstruction technique. MEDICATIONS: 3 g IV Ancef  ANESTHESIA/SEDATION: General COMPLICATIONS: None immediate. PROCEDURE: Informed written consent was obtained from the patient after a thorough discussion of the procedural risks, benefits and alternatives. All questions were addressed. Maximal Sterile Barrier Technique was utilized including caps, mask, sterile gowns, sterile gloves, sterile drape, hand hygiene and skin antiseptic. A timeout was performed prior to the initiation of the procedure. The patient was placed in a prone position under general anesthesia in anticipation performing percutaneous cryoablation of a right renal mass. Initial CT and ultrasound evaluation of the right kidney was performed. A 17 gauge trocar needle was advanced to the upper pole of the right kidney under ultrasound guidance. Two separate coaxial 18 gauge core biopsy samples were then obtained. Samples were submitted in formalin. A slurry of Gel-Foam pledgets was then injected via the outer needle as the needle was retracted and removed. Additional CT was performed. FINDINGS: Initial unenhanced CT as well as ultrasound does not demonstrate a discrete mass along the upper pole of the right kidney corresponding to the area of abnormal enhancement seen on prior MRI. IV contrast could not be administered due to underlying renal insufficiency and chronic kidney disease. As a focal target was difficult to ascertain for cryoablation, decision was made to perform a biopsy only at this time of the upper pole targeting the region of abnormal enhancement seen by prior MRI. IMPRESSION: Discrete mass not identified along the upper pole of the right kidney by unenhanced CT or ultrasound. Decision made to perform biopsy only today under image guidance at the level abnormal enhancement  seen previously by MRI. Cryoablation was not performed today. Follow-up will be performed after biopsy results are finalized. Electronically Signed   By: Marcey Moan M.D.   On: 08/20/2024 16:17   ECHOCARDIOGRAM COMPLETE Result Date: 08/14/2024    ECHOCARDIOGRAM REPORT   Patient Name:   Jacob Berger Date of Exam: 08/14/2024 Medical Rec #:  [id number]           Height:       72.0 in Accession #:    [id number]          Weight:       271.0 lb Date of Birth:  Jan 06, 1946           BSA:          2.424 m Patient Age:    77 years            BP:           133/84 mmHg Patient Gender: M                   HR:           59 bpm. Exam Location:  [address] Procedure: 2D Echo, 3D Echo, Cardiac Doppler and Color Doppler (Both Spectral            and Color Flow Doppler were utilized during procedure). Indications:    R94.39 Abnormal Stress Testing                 R94.31 Abnormal EKG  History:        Patient has prior history of Echocardiogram examinations, most                 recent 05/10/2016. CAD, Abnormal ECG, Signs/Symptoms:Murmur; Risk  Factors:Sleep Apnea, Dyslipidemia, Hypertension, Former Smoker                 and Diabetes. Ascending Aortic Aneurysm (Prior 4.5cm by CT),                 Chronic Kidney Disease, Pre-Operative Eval for Renal Surgery,                 Obesity.  Sonographer:    Heather Hawks RDCS Referring Phys: JACKEE DEL DICK IMPRESSIONS  1. Left ventricular ejection fraction, by estimation, is 60 to 65%. Left ventricular ejection fraction by 3D volume is 66 %. The left ventricle has normal function. The left ventricle has no regional wall motion abnormalities. Left ventricular diastolic  parameters were normal.  2. Right ventricular systolic function is normal. The right ventricular size is normal.  3. The mitral valve is normal in structure. No evidence of mitral valve regurgitation. No evidence of mitral stenosis.  4. The aortic valve is tricuspid. Aortic valve regurgitation is  mild. No aortic stenosis is present.  5. 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  6. The inferior vena cava is dilated in size with <50% respiratory variability, suggesting right atrial pressure of 15 mmHg. FINDINGS  Left Ventricle: Left ventricular ejection fraction, by estimation, is 60 to 65%. Left ventricular ejection fraction by 3D volume is 66 %. The left ventricle has normal function. The left ventricle has no regional wall motion abnormalities. The left ventricular internal cavity size was normal in size. There is no left ventricular hypertrophy. Left ventricular diastolic parameters were normal. Right Ventricle: The right ventricular size is normal. No increase in right ventricular wall thickness. Right ventricular systolic function is normal. Left Atrium: Left atrial size was normal in size. Right Atrium: Right atrial size was normal in size. Pericardium: There is no evidence of pericardial effusion. Mitral Valve: The mitral valve is normal in structure. No evidence of mitral valve regurgitation. No evidence of mitral valve stenosis. Tricuspid Valve: The tricuspid valve is normal in structure. Tricuspid valve regurgitation is mild . No evidence of tricuspid stenosis. Aortic Valve: The aortic valve is tricuspid. Aortic valve regurgitation is mild. Aortic regurgitation PHT measures 603 msec. No aortic stenosis is present. Aortic valve mean gradient measures 7.0 mmHg. Aortic valve peak gradient measures 12.7 mmHg. Aortic valve area, by VTI measures 3.47 cm. Pulmonic Valve: The pulmonic valve was grossly normal. Pulmonic valve regurgitation is not visualized. No evidence of pulmonic stenosis. Aorta: The aortic root is normal in size and structure. 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 Venous: The inferior vena cava is dilated in size with less than 50% respiratory variability, suggesting right atrial pressure of 15 mmHg. IAS/Shunts: No atrial level  shunt detected by color flow Doppler. Additional Comments: 3D was performed not requiring image post processing on an independent workstation and was normal.  LEFT VENTRICLE PLAX 2D LVIDd:         4.60 cm         Diastology LVIDs:         2.50 cm         LV e' medial:    6.14 cm/s LV PW:         1.20 cm         LV E/e' medial:  13.5 LV IVS:        1.30 cm         LV e' lateral:  7.51 cm/s LVOT diam:     2.30 cm         LV E/e' lateral: 11.0 LV SV:         149 LV SV Index:   62 LVOT Area:     4.15 cm        3D Volume EF LV IVRT:       81 msec         LV 3D EF:    Left                                             ventricul                                             ar                                             ejection                                             fraction                                             by 3D                                             volume is                                             66 %.                                 3D Volume EF:                                3D EF:        66 %                                LV EDV:       129 ml                                LV ESV:       44 ml                                LV SV:        85  ml RIGHT VENTRICLE RV Basal diam:  3.40 cm     PULMONARY VEINS RV S prime:     15.50 cm/s  Diastolic Velocity: 34.60 cm/s TAPSE (M-mode): 2.2 cm      S/D Velocity:       1.60 RVSP:           31.7 mmHg   Systolic Velocity:  56.20 cm/s LEFT ATRIUM            Index        RIGHT ATRIUM           Index LA diam:      4.60 cm  1.90 cm/m   RA Pressure: 3.00 mmHg LA Vol (A2C): 103.0 ml 42.49 ml/m  RA Area:     19.20 cm LA Vol (A4C): 47.1 ml  19.43 ml/m  RA Volume:   56.40 ml  23.27 ml/m  AORTIC VALVE AV Area (Vmax):    3.62 cm AV Area (Vmean):   3.33 cm AV Area (VTI):     3.47 cm AV Vmax:           178.00 cm/s AV Vmean:          123.000 cm/s AV VTI:            0.430 m AV Peak Grad:      12.7 mmHg AV Mean Grad:      7.0 mmHg LVOT Vmax:         155.00 cm/s  LVOT Vmean:        98.500 cm/s LVOT VTI:          0.360 m LVOT/AV VTI ratio: 0.84 AI PHT:            603 msec  AORTA Ao Root diam: 3.80 cm Ao Asc diam:  5.40 cm MITRAL VALVE               TRICUSPID VALVE MV Area (PHT): cm         TR Peak grad:   28.7 mmHg MV Decel Time: 284 msec    TR Vmax:        268.00 cm/s MV E velocity: 82.80 cm/s  Estimated RAP:  3.00 mmHg MV A velocity: 94.50 cm/s  RVSP:           31.7 mmHg MV E/A ratio:  0.88                            SHUNTS                            Systemic VTI:  0.36 m                            Systemic Diam: 2.30 cm Joelle Azobou Tonleu Electronically signed by Joelle Cedars Tonleu Signature Date/Time: 08/14/2024/1:40:47 PM    Final    DG Chest 1 View Result Date: 08/13/2024 CLINICAL DATA:  Preop, right renal mass. EXAM: CHEST  1 VIEW COMPARISON:  Radiograph 05/23/2019, CT 07/03/2024 FINDINGS: The cardiomediastinal contours are stable. Aortic atherosclerosis and tortuosity. The lungs are clear. Pulmonary vasculature is normal. No consolidation, pleural effusion, or pneumothorax. No acute osseous abnormalities are seen. IMPRESSION: No active disease. Electronically Signed   By: Andrea Gasman M.D.   On: 08/13/2024 18:01    Labs:  CBC: Recent Labs    08/13/24 0954 08/20/24  1140  WBC 6.8 6.5  HGB 12.3* 11.3*  HCT 39.7 35.9*  PLT 195 181    COAGS: Recent Labs    08/13/24 0954 08/20/24 1140  INR 1.0 1.1    BMP: Recent Labs    08/13/24 0954 08/20/24 1140  NA 138 142  K 4.1 4.0  CL 104 107  CO2 22 23  GLUCOSE 108* 106*  BUN 43* 39*  CALCIUM 10.0 9.6  CREATININE 2.03* 1.65*  GFRNONAA 33* 42*    Assessment and Plan:  Patient doing well after right renal biopsy. Foley catheter out with no hematuria. OK for discharge. Will follow up in clinic after biopsy results final.  Electronically Signed: Marcey ONEIDA Moan, MD 09/09/2024, 3:17 PM

## 2024-09-16 DIAGNOSIS — I2510 Atherosclerotic heart disease of native coronary artery without angina pectoris: Secondary | ICD-10-CM | POA: Diagnosis not present

## 2024-09-16 DIAGNOSIS — E1140 Type 2 diabetes mellitus with diabetic neuropathy, unspecified: Secondary | ICD-10-CM | POA: Diagnosis not present

## 2024-09-16 DIAGNOSIS — E782 Mixed hyperlipidemia: Secondary | ICD-10-CM | POA: Diagnosis not present

## 2024-09-16 DIAGNOSIS — I10 Essential (primary) hypertension: Secondary | ICD-10-CM | POA: Diagnosis not present

## 2024-09-16 DIAGNOSIS — E1165 Type 2 diabetes mellitus with hyperglycemia: Secondary | ICD-10-CM | POA: Diagnosis not present

## 2024-09-16 DIAGNOSIS — N1831 Chronic kidney disease, stage 3a: Secondary | ICD-10-CM | POA: Diagnosis not present

## 2024-09-16 DIAGNOSIS — I700 Atherosclerosis of aorta: Secondary | ICD-10-CM | POA: Diagnosis not present

## 2024-09-16 DIAGNOSIS — I119 Hypertensive heart disease without heart failure: Secondary | ICD-10-CM | POA: Diagnosis not present

## 2024-09-22 DIAGNOSIS — I7123 Aneurysm of the descending thoracic aorta, without rupture: Secondary | ICD-10-CM | POA: Diagnosis not present

## 2024-09-22 DIAGNOSIS — E1122 Type 2 diabetes mellitus with diabetic chronic kidney disease: Secondary | ICD-10-CM | POA: Diagnosis not present

## 2024-09-22 DIAGNOSIS — M545 Low back pain, unspecified: Secondary | ICD-10-CM | POA: Diagnosis not present

## 2024-09-22 DIAGNOSIS — Z794 Long term (current) use of insulin: Secondary | ICD-10-CM | POA: Diagnosis not present

## 2024-09-22 DIAGNOSIS — E782 Mixed hyperlipidemia: Secondary | ICD-10-CM | POA: Diagnosis not present

## 2024-09-22 DIAGNOSIS — I10 Essential (primary) hypertension: Secondary | ICD-10-CM | POA: Diagnosis not present

## 2024-09-22 DIAGNOSIS — R21 Rash and other nonspecific skin eruption: Secondary | ICD-10-CM | POA: Diagnosis not present

## 2024-09-22 DIAGNOSIS — N1831 Chronic kidney disease, stage 3a: Secondary | ICD-10-CM | POA: Diagnosis not present

## 2024-09-22 DIAGNOSIS — M1A00X0 Idiopathic chronic gout, unspecified site, without tophus (tophi): Secondary | ICD-10-CM | POA: Diagnosis not present

## 2024-09-22 DIAGNOSIS — I7121 Aneurysm of the ascending aorta, without rupture: Secondary | ICD-10-CM | POA: Diagnosis not present

## 2024-09-22 DIAGNOSIS — I119 Hypertensive heart disease without heart failure: Secondary | ICD-10-CM | POA: Diagnosis not present

## 2024-11-04 ENCOUNTER — Emergency Department (HOSPITAL_COMMUNITY)
Admission: EM | Admit: 2024-11-04 | Discharge: 2024-11-04 | Disposition: A | Discharge status: Home / Self Care | Source: Ambulatory Visit | Attending: Emergency Medicine | Admitting: Emergency Medicine

## 2024-11-04 ENCOUNTER — Other Ambulatory Visit: Payer: Self-pay

## 2024-11-04 ENCOUNTER — Telehealth: Payer: Self-pay | Admitting: Cardiology

## 2024-11-04 ENCOUNTER — Other Ambulatory Visit (HOSPITAL_COMMUNITY): Payer: Self-pay

## 2024-11-04 ENCOUNTER — Emergency Department (EMERGENCY_DEPARTMENT_HOSPITAL)

## 2024-11-04 ENCOUNTER — Ambulatory Visit (HOSPITAL_COMMUNITY)
Admission: RE | Admit: 2024-11-04 | Discharge: 2024-11-04 | Disposition: A | Discharge status: Home / Self Care | Source: Ambulatory Visit | Attending: Cardiovascular Disease | Admitting: Cardiovascular Disease

## 2024-11-04 ENCOUNTER — Telehealth (HOSPITAL_COMMUNITY): Payer: Self-pay

## 2024-11-04 DIAGNOSIS — M7989 Other specified soft tissue disorders: Secondary | ICD-10-CM | POA: Diagnosis not present

## 2024-11-04 DIAGNOSIS — I10 Essential (primary) hypertension: Secondary | ICD-10-CM | POA: Insufficient documentation

## 2024-11-04 DIAGNOSIS — E1169 Type 2 diabetes mellitus with other specified complication: Secondary | ICD-10-CM

## 2024-11-04 DIAGNOSIS — R7989 Other specified abnormal findings of blood chemistry: Secondary | ICD-10-CM | POA: Diagnosis not present

## 2024-11-04 DIAGNOSIS — I2510 Atherosclerotic heart disease of native coronary artery without angina pectoris: Secondary | ICD-10-CM | POA: Insufficient documentation

## 2024-11-04 DIAGNOSIS — R079 Chest pain, unspecified: Secondary | ICD-10-CM | POA: Diagnosis present

## 2024-11-04 DIAGNOSIS — Z905 Acquired absence of kidney: Secondary | ICD-10-CM | POA: Diagnosis not present

## 2024-11-04 DIAGNOSIS — I7121 Aneurysm of the ascending aorta, without rupture: Secondary | ICD-10-CM

## 2024-11-04 DIAGNOSIS — R0602 Shortness of breath: Secondary | ICD-10-CM | POA: Diagnosis not present

## 2024-11-04 DIAGNOSIS — I2699 Other pulmonary embolism without acute cor pulmonale: Secondary | ICD-10-CM | POA: Diagnosis not present

## 2024-11-04 DIAGNOSIS — Z01810 Encounter for preprocedural cardiovascular examination: Secondary | ICD-10-CM | POA: Insufficient documentation

## 2024-11-04 DIAGNOSIS — Z7901 Long term (current) use of anticoagulants: Secondary | ICD-10-CM | POA: Diagnosis not present

## 2024-11-04 DIAGNOSIS — I129 Hypertensive chronic kidney disease with stage 1 through stage 4 chronic kidney disease, or unspecified chronic kidney disease: Secondary | ICD-10-CM | POA: Diagnosis not present

## 2024-11-04 DIAGNOSIS — E785 Hyperlipidemia, unspecified: Secondary | ICD-10-CM | POA: Insufficient documentation

## 2024-11-04 DIAGNOSIS — N189 Chronic kidney disease, unspecified: Secondary | ICD-10-CM | POA: Insufficient documentation

## 2024-11-04 LAB — COMPREHENSIVE METABOLIC PANEL WITH GFR
ALT: 15 U/L (ref 0–44)
AST: 25 U/L (ref 15–41)
Albumin: 4.2 g/dL (ref 3.5–5.0)
Alkaline Phosphatase: 59 U/L (ref 38–126)
Anion gap: 12 (ref 5–15)
BUN: 31 mg/dL — ABNORMAL HIGH (ref 8–23)
CO2: 22 mmol/L (ref 22–32)
Calcium: 9.4 mg/dL (ref 8.9–10.3)
Chloride: 104 mmol/L (ref 98–111)
Creatinine, Ser: 1.65 mg/dL — ABNORMAL HIGH (ref 0.61–1.24)
GFR, Estimated: 42 mL/min — ABNORMAL LOW
Glucose, Bld: 182 mg/dL — ABNORMAL HIGH (ref 70–99)
Potassium: 3.8 mmol/L (ref 3.5–5.1)
Sodium: 137 mmol/L (ref 135–145)
Total Bilirubin: 0.3 mg/dL (ref 0.0–1.2)
Total Protein: 7.4 g/dL (ref 6.5–8.1)

## 2024-11-04 LAB — CBC WITH DIFFERENTIAL/PLATELET
Abs Immature Granulocytes: 0.04 K/uL (ref 0.00–0.07)
Basophils Absolute: 0 K/uL (ref 0.0–0.1)
Basophils Relative: 1 %
Eosinophils Absolute: 0.2 K/uL (ref 0.0–0.5)
Eosinophils Relative: 2 %
HCT: 38.2 % — ABNORMAL LOW (ref 39.0–52.0)
Hemoglobin: 12.6 g/dL — ABNORMAL LOW (ref 13.0–17.0)
Immature Granulocytes: 1 %
Lymphocytes Relative: 23 %
Lymphs Abs: 1.7 K/uL (ref 0.7–4.0)
MCH: 29.4 pg (ref 26.0–34.0)
MCHC: 33 g/dL (ref 30.0–36.0)
MCV: 89.3 fL (ref 80.0–100.0)
Monocytes Absolute: 0.5 K/uL (ref 0.1–1.0)
Monocytes Relative: 7 %
Neutro Abs: 4.9 K/uL (ref 1.7–7.7)
Neutrophils Relative %: 66 %
Platelets: 182 K/uL (ref 150–400)
RBC: 4.28 MIL/uL (ref 4.22–5.81)
RDW: 14.1 % (ref 11.5–15.5)
WBC: 7.3 K/uL (ref 4.0–10.5)
nRBC: 0 % (ref 0.0–0.2)

## 2024-11-04 LAB — TROPONIN T, HIGH SENSITIVITY
Troponin T High Sensitivity: 31 ng/L — ABNORMAL HIGH (ref 0–19)
Troponin T High Sensitivity: 37 ng/L — ABNORMAL HIGH (ref 0–19)

## 2024-11-04 LAB — PRO BRAIN NATRIURETIC PEPTIDE: Pro Brain Natriuretic Peptide: 51.6 pg/mL

## 2024-11-04 MED ORDER — APIXABAN 5 MG PO TABS: 5.0000 mg | ORAL_TABLET | Freq: Two times a day (BID) | ORAL | Status: DC

## 2024-11-04 MED ORDER — APIXABAN 5 MG PO TABS
10.0000 mg | ORAL_TABLET | Freq: Two times a day (BID) | ORAL | Status: DC
  Administered 2024-11-04: 10 mg via ORAL
  Filled 2024-11-04: qty 2

## 2024-11-04 MED ORDER — IOHEXOL 350 MG/ML SOLN
75.0000 mL | Freq: Once | INTRAVENOUS | Status: AC | PRN
  Administered 2024-11-04: 75 mL via INTRAVENOUS

## 2024-11-04 MED ORDER — HEPARIN BOLUS VIA INFUSION
6500.0000 [IU] | Freq: Once | INTRAVENOUS | Status: DC
  Filled 2024-11-04: qty 6500

## 2024-11-04 MED ORDER — APIXABAN (ELIQUIS) VTE STARTER PACK (10MG AND 5MG): ORAL_TABLET | ORAL | 0 refills | Status: AC

## 2024-11-04 MED ORDER — HEPARIN (PORCINE) 25000 UT/250ML-% IV SOLN: 1800.0000 [IU]/h | INTRAVENOUS | Status: DC

## 2024-11-04 NOTE — ED Provider Triage Note (Signed)
 Emergency Medicine Provider Triage Evaluation Note  Markevius Trombetta , a 78 y.o. male  was evaluated in triage.  Pt complains of pulmonary embolisms that were found on outpatient CT scan.  Review of Systems  Positive: Some shortness of breath and chest pain at times Negative: Fever chills swelling  Physical Exam  BP 136/67 (BP Location: Right Arm)   Pulse 66   Temp 97.6 F (36.4 C)   Resp (!) 24   Ht 6' (1.829 m)   Wt 124.7 kg   SpO2 100%   BMI 37.30 kg/m  Gen:   Awake, no distress   Resp:  Normal effort  MSK:   Moves extremities without difficulty  Other:    Medical Decision Making  Medically screening exam initiated at 11:22 AM.  Appropriate orders placed.  Cheikh Bramble Kastelic was informed that the remainder of the evaluation will be completed by another provider, this initial triage assessment does not replace that evaluation, and the importance of remaining in the ED until their evaluation is complete.  Reviewed CT scan that showed multiple pulmonary emboli bilaterally.  Will start him on heparin per PE protocol get basic labs anticipate admission.  Will keep him in triage until an acute care bed is ready.   Ruthe Cornet, DO 11/04/24 1123

## 2024-11-04 NOTE — Telephone Encounter (Signed)
 Caller Theresa) stated Dr. Landy wants to speak with the cardiologist regarding this patient's test results.

## 2024-11-04 NOTE — ED Triage Notes (Signed)
 Pt. Stated, I had a CT of lung and I was called to say I had multiple clots in my lungs. Ive had some SOB only.

## 2024-11-04 NOTE — Telephone Encounter (Signed)
 Pharmacy Patient Advocate Encounter  Insurance verification completed.    The patient is insured through [employer]. Patient has Medicare and is not eligible for a copay card, but may be able to apply for patient assistance or Medicare RX Payment Plan (Patient Must reach out to their plan, if eligible for payment plan), if available.    Ran test claim for eliquis  5mg  tablet and the current 30 day co-pay is $0.   This test claim was processed through [employer]- copay amounts may vary at other pharmacies due to [employer], or as the patient moves through the different stages of their insurance plan.

## 2024-11-04 NOTE — Assessment & Plan Note (Signed)
-   Hold baby aspirin  while on Eliquis

## 2024-11-04 NOTE — ED Notes (Signed)
 Patient discharged by RN. Patient verbalizes understanding of instructions. Patient in wheelchair with family to lobby.

## 2024-11-04 NOTE — Hospital Course (Addendum)
 Jacob Berger is a 78 y.o. year old male with past medical history of CAD, CHF, OSA, type 2 diabetes mellitus, hypertension, chronic kidney disease stage III, gout, and obstructive sleep apnea on CPAP.  He presents to [facility] ED, his cardiologist called and sent him to the ED after a CTA of his chest this morning that showed bilateral small pulmonary emboli without right heart strain.  Emboli seen most prominently within the right lower and middle lobes.  Patient reports he has been experiencing chest tightness and shortness of breath for approximately 6 months after an episode of heat exhaustion this summer.  He also notes (L) lower extremity swelling that has been present since his left knee replacement.  He denies any recent change, worsening, or progression in his symptomatology.  He denies palpitations, chest pain, dizziness, syncope, or near syncope.  On evaluation in the ED patient is not in any acute distress and he is hemodynamically stable with BP 131/64 HR 69 RR 14 SPO2 96% on RA.

## 2024-11-04 NOTE — Progress Notes (Signed)
 Venous duplex lower ext  has been completed. Refer to [facility] under chart review to view preliminary results.   11/04/2024  2:53 PM Anaysha Andre, Ricka BIRCH

## 2024-11-04 NOTE — Telephone Encounter (Signed)
 Received call from radiologist that patient's CTA chest today showed multiple bilateral small pulmonary emboli.  Spoke with patient, he reports he has been feeling short of breath.  Recommend evaluation in the ED, discussed with patient and his daughter, they are agreeable, will go to ED for evaluation and to start anticoagulation.

## 2024-11-04 NOTE — Consult Note (Signed)
 [facility] Initial Consultation Note  Patient Name: Jacob Berger    FMW:[id number] PCP: Verdia Lombard, MD     DOB: May 27, 1946  DOA: 11/04/2024 DOS: the patient was seen and examined on 11/04/2024  Primary Service: Yolande Lamar BROCKS, MD Referring physician: Dr. Jakie from ED Reason for consult: Admission concern for PE.  Assessment/Plan Pulmonary embolism. Small. Incidental finding. Patient has chronic chest pain and shortness of breath.  CT scan was ordered for further evaluation of thoracic aneurysm. PE is small.  Currently no right ventricular strain.  Lung examination unremarkable as well. Lower extremity Doppler negative for DVT. Troponin minimally elevated. Started on anticoagulation. Discussed with CT surgery and appreciate assistance, no contraindication for anticoagulation for now. Will hold baby aspirin  while the patient is on anticoagulation. For now would recommend 78-month of anticoagulation.  Although prolonged ejaculation can be justified as well given his history of renal cell cancer as well as prostate cancer.  Thoracic aneurysm. Appears to be stable since last imaging and at least for last 5 years based on the imaging currently performed. Patient will follow-up with CT surgery outpatient. I have discussed I will refer the patient to CT surgery.  History of CAD. No recent PCI intervention performed. Recent echocardiogram as well as stress test did not show any acute changes well. Patient is on 81 mg aspirin  only. While the patient is on full dose anticoagulation with Eliquis , would recommend to pause the aspirin  to reduce risk of bleeding. Follow-up with cardiology as recommended. Continue as needed nitroglycerin .  Type 2 diabetes mellitus. Patient is on Lantus, NovoLog  FlexPen, Trulicity . Would like to continue same regimen for now.  HLD. On Tricor . Continue.  Left leg edema. Lower extreme Doppler is negative. Patient is on Lasix   as needed outpatient. Would recommend to continue for now.  We will sign off at present, please call us  again as needed. Discussed with ED provider, patient appears to be safe for discharge from the ED based on stable hemodynamics. Family at bedside.  Discussed with them as well.  Provided answers to all questions.  HPI: Jacob Berger is a 78 y.o. male with Past medical history of CAD, HTN, HLD, AAA, T2DM, renal cancer, prostate cancer CKD 3B.  Patient is coming from Home Jacob Berger is a 78 y.o. year old male with past medical history of CAD, CHF, OSA, type 2 diabetes mellitus, hypertension, chronic kidney disease stage III, gout, and obstructive sleep apnea on CPAP.  He presents to [facility] ED, his cardiologist called and sent him to the ED after a CTA of his chest this morning that showed bilateral small pulmonary emboli without right heart strain.  Emboli seen most prominently within the right lower and middle lobes.  Patient reports he has been experiencing chest tightness and shortness of breath for approximately 6 months after an episode of heat exhaustion this summer.  He also notes (L) lower extremity swelling that has been present since his left knee replacement.  He denies any recent change, worsening, or progression in his symptomatology.  He denies palpitations, chest pain, dizziness, syncope, or near syncope.  On evaluation in the ED patient is not in any acute distress and he is hemodynamically stable with BP 131/64 HR 69 RR 14 SPO2 96% on RA.   At the time of my evaluation denies any chest pain.  No nausea no vomiting.  Reports he has swelling in his legs chronically.  Denies any dizziness or lightheadedness. Denies any recent hospitalization.  Prolonged immobilization.  Recent procedures. Denies any fall trauma or injury. Patient was on DAPT in the past and has handled it without any reported bleeding history. Does not report having any bleeding right now. Reports he  is compliant with all his medications. Does have history of prostate cancer as well as renal cell carcinoma status post left nephrectomy which appears to be stable based on his imaging.  The CT scan that was performed was ordered in 07/25/2024 for evaluation of thoracic aneurysm.  Thus PE is an incidental finding on the CT scan.  Review of Systems: as mentioned in the history of present illness.  All other systems reviewed and are negative.  Past Medical History:  Diagnosis Date   Arthritis    knees (05/10/2016)   CAD S/P percutaneous coronary angioplasty 2011   a. 2011: s/p PCI to PDA with Endeavor 2.5 mm x 12 mm DES - [facility] b. low-risk NST in 07/2015   Cancer of kidney [facility])    Chronic kidney disease    Daily headache    recently (05/10/2016)   Dyslipidemia, goal LDL below 70    Glaucoma    Gout    Heart murmur    History of kidney stones    Hypertension, essential    Obesity, Class II, BMI 35-39.9, with comorbidity    BMI 37   OSA on CPAP    PONV (postoperative nausea and vomiting)    after colonoscopy   10-15 yr ago per pt.   Prostate cancer (HCC) 2007   Rash since 04-08-16   on neck healing, right arm improving   Thoracic aortic aneurysm without rupture (HCC) - Noted on chest CT. 4.5 cm 02/15/2016   4.5 cm per dr lucas note and [facility] ct 01-18-16   Type II diabetes mellitus [facility])    Past Surgical History:  Procedure Laterality Date   ANTERIOR CERVICAL DECOMP/DISCECTOMY FUSION N/A 11/09/2017   Procedure: Cervical four-five, Cervical five-six Anterior discectomy with fusion and plate fixation;  Surgeon: Ditty, Morene Hicks, MD;  Location: [facility] OR;  Service: Neurosurgery;  Laterality: N/A;   COLONOSCOPY W/ BIOPSIES AND POLYPECTOMY  several   CORONARY ANGIOPLASTY WITH STENT PLACEMENT  2011   [place] : Endeavor 2.5 mm x 12 mm DES - distal PDA ([facility])   EYE SURGERY     glaucoma and cataract   IR RADIOLOGIST EVAL & MGMT   07/09/2024   IR RADIOLOGIST EVAL & MGMT  09/04/2024   LAPAROSCOPIC NEPHRECTOMY Left 04/13/2016   Procedure: LAPAROSCOPIC RADICAL LEFT NEPHRECTOMY;  Surgeon: Gretel Ferrara, MD;  Location: [facility] ORS;  Service: Urology;  Laterality: Left;   NM MYOVIEW  ([facility] HX)  6/'13, 3/'16   a. Treadmill Myoview : 10 minutes, 12 METS; no ischemia infarction, EF 65%; b. [facility]: LOW RISK. EF 57%. 10.4 METS low risk scan no ischemia. No wall motion abnormality.    NM MYOVIEW  LTD  05/2017   EF 50%. Normal function. No ischemia or infarction.   PROSTATECTOMY     RADIOLOGY WITH ANESTHESIA Right 08/20/2024   Procedure: CT WITH ANESTHESIA;  Surgeon: Radiologist, Medication, MD;  Location: [facility] ORS;  Service: Radiology;  Laterality: Right;  CT CRYOABLATION RIGHT RENAL MASS   REFRACTIVE SURGERY Left    TOTAL KNEE ARTHROPLASTY Left 05/27/2019   Procedure: Left Knee Arthroplasty;  Surgeon: Sheril Coy, MD;  Location: [facility] ORS;  Service: Orthopedics;  Laterality: Left;   TRANSTHORACIC ECHOCARDIOGRAM  05/2016   EF 60 to 65%.  No R WMA.  GR 1 DD.  Normal valves.   Social History:  reports that he quit smoking about 31 years ago. His smoking use included cigarettes. He has never used smokeless tobacco. He reports current alcohol use of about 2.0 standard drinks of alcohol per week. He reports that he does not use drugs. Allergies[1] Family History  Problem Relation Age of Onset   Hypertension Son    Prior to Admission medications  Medication Sig Start Date End Date Taking? Authorizing Provider  allopurinol  (ZYLOPRIM ) 100 MG tablet Take 100 mg by mouth daily.    [provider]  aspirin  EC 81 MG tablet Take 81 mg by mouth daily.    [provider]  AZOR  10-40 MG per tablet TAKE 1 TABLET BY MOUTH DAILY 12/18/13   Anner Alm ORN, MD  bimatoprost (LUMIGAN) 0.01 % SOLN Place 1 drop into both eyes at bedtime.    [provider]  Carboxymethylcellulose Sodium (REFRESH LIQUIGEL) 1 % GEL Place 1 drop into  both eyes 4 (four) times daily as needed (eye irritation).    [provider]  diphenhydramine -acetaminophen  (TYLENOL  PM) 25-500 MG TABS tablet Take 1 tablet by mouth at bedtime as needed (sleep).    [provider]  Dulaglutide  (TRULICITY ) 1.5 MG/0.5ML SOAJ Inject 1.5 mg into the skin once a week.    [provider]  fenofibrate  (TRICOR ) 145 MG tablet Take 145 mg by mouth daily.    [provider]  furosemide  (LASIX ) 80 MG tablet Take 20-80 mg by mouth daily as needed for fluid or edema.    [provider]  LANTUS SOLOSTAR 100 UNIT/ML Solostar Pen Inject 18-20 Units into the skin at bedtime. 05/30/24   [provider]  Methylcellulose, Laxative, (CITRUCEL) 500 MG TABS Take 500 mg by mouth daily.    [provider]  nitroGLYCERIN  (NITROSTAT ) 0.4 MG SL tablet Place 1 tablet (0.4 mg total) under the tongue every 5 (five) minutes as needed for chest pain. 07/25/24 10/23/24  Anner Alm ORN, MD  NOVOLOG  FLEXPEN 100 UNIT/ML FlexPen Inject 18-20 Units into the skin 3 (three) times daily before meals. 08/10/16   [provider]  ofloxacin (OCUFLOX) 0.3 % ophthalmic solution Place 1 drop into the left eye at bedtime. 07/22/24   [provider]  olmesartan  (BENICAR ) 20 MG tablet Take 20 mg by mouth daily. 07/29/24   [provider]  sildenafil  (VIAGRA ) 50 MG tablet Take 1 tablet (total 50 mg)  to 1.5 tablet ( total 75 mg) as needed for erectile dysfunction 03/29/20   Anner Alm ORN, MD  timolol  (TIMOPTIC ) 0.5 % ophthalmic solution Place 1 drop into both eyes 2 (two) times daily. Morning and afternoon 10/01/17   [provider]  TURMERIC CURCUMIN PO Take 1 each by mouth daily.    [provider]  VYTORIN  10-40 MG per tablet Take 1 tablet by mouth daily. 04/27/15   [provider]    Physical Exam: Vitals:   11/04/24 1034 11/04/24 1059 11/04/24 1200  BP: 136/67  136/64  Pulse: 66  63  Resp: (!)  24  (!) 22  Temp: 97.6 F (36.4 C)  98 F (36.7 C)  TempSrc:   Oral  SpO2: 100%  100%  Weight:  124.7 kg   Height:  6' (1.829 m)    Clear to auscultation. S1-S2 present margin bowel sound present Lower extremity edema seen.  Left more than right.  No calf tenderness.  Data Reviewed: Since  last encounter, pertinent lab results CBC and BMP   . I have ordered test including none  . I have discussed pt's care plan and test results with ED provider as well as CT surgery  .   Family Communication: Discussed with family at bedside Primary team communication: Discussed with ED provider. Thank you very much for involving us  in care of your patient.  Author: Yetta Blanch, MD [facility] 11/04/2024 2:51 PM    To reach us , Look up on care teams to locate the [facility] team or provider name and reach out to them via secure chat or [email or web address]. Between 7PM-7AM, please contact night-coverage. If you still have difficulty reaching the attending provider, please page the Director on Call-DOC for [facility] on amion for assistance.    [1]  Allergies Allergen Reactions   Accupril [Quinapril Hcl] Swelling    MOUTH SWELLING

## 2024-11-04 NOTE — Assessment & Plan Note (Signed)
 CTA PE: Small bilateral pulmonary emboli are noted, most prominently seen in right lower and middle lobe branches. No (R) heart strain PESI 119 Class IV Symptoms: Chest Tightness and SOB that have been ongoing for ~6 months. Patient reports symptoms have not worsened recently. Describes SOB as mild and does not interfere with daily activity  HR 69 RR 14 SPO2 96% on RA - Eliquis  10 mg BID for 7 days, then 5 mg BID for 3-6 months - BLE DVT Study - Troponin: 31--> - Follow up with PCP within 1 week - Strict Return precautions provided to patient and daughter at bedside

## 2024-11-04 NOTE — Progress Notes (Addendum)
 PHARMACY - ANTICOAGULATION CONSULT NOTE  Pharmacy Consult for iV heparin Indication: pulmonary embolus  Allergies[1]  Patient Measurements: Height: 6' (182.9 cm) Weight: 124.7 kg (275 lb) IBW/kg (Calculated) : 77.6 HEPARIN DW (KG): 105.3  Vital Signs: Temp: 97.6 F (36.4 C) (12/30 1034) BP: 136/67 (12/30 1034) Pulse Rate: 66 (12/30 1034)  Labs: No results for input(s): HGB, HCT, PLT, APTT, LABPROT, INR, HEPARINUNFRC, HEPRLOWMOCWT, CREATININE, CKTOTAL, CKMB, TROPONINIHS in the last 72 hours.  CrCl cannot be calculated (Patient's most recent lab result is older than the maximum 21 days allowed.).   Medical History: Past Medical History:  Diagnosis Date   Arthritis    knees (05/10/2016)   CAD S/P percutaneous coronary angioplasty 2011   a. 2011: s/p PCI to PDA with Endeavor 2.5 mm x 12 mm DES - [facility] b. low-risk NST in 07/2015   Cancer of kidney [facility])    Chronic kidney disease    Daily headache    recently (05/10/2016)   Dyslipidemia, goal LDL below 70    Glaucoma    Gout    Heart murmur    History of kidney stones    Hypertension, essential    Obesity, Class II, BMI 35-39.9, with comorbidity    BMI 37   OSA on CPAP    PONV (postoperative nausea and vomiting)    after colonoscopy   10-15 yr ago per pt.   Prostate cancer (HCC) 2007   Rash since 04-08-16   on neck healing, right arm improving   Thoracic aortic aneurysm without rupture (HCC) - Noted on chest CT. 4.5 cm 02/15/2016   4.5 cm per dr lucas note and [facility] ct 01-18-16   Type II diabetes mellitus (HCC)    Assessment: Jacob Berger is a 78 y.o. year old male admitted on 11/04/2024 with concern for shortness of breath referred from outpt with new PE. CTA with small bilateral pulmonary emboli with no RHS noted. No anticoagulation prior to admission. Pharmacy consulted to dose heparin.  Goal of Therapy:  Heparin level 0.3-0.7 units/ml Monitor  platelets by anticoagulation protocol: Yes   Plan:  Heparin 6500 units x 1 as bolus followed by heparin infusion at 1800 units/hr 6h heparin level  Daily heparin level, CBC, and monitoring for bleeding F/u plans for anticoagulation   Thank you for allowing pharmacy to participate in this patient's care.  Leonor GORMAN Bash, PharmD Emergency Medicine Clinical Pharmacist 11/04/2024,11:29 AM  ------------- Addendum:  Heparin not started, will transition to oral anticoagulation with eliquis .  Thank you for allowing pharmacy to participate in this patient's care.  Leonor GORMAN Bash, PharmD Emergency Medicine Clinical Pharmacist 11/04/2024,12:28 PM      [1]  Allergies Allergen Reactions   Accupril Janann Hcl] Swelling    MOUTH SWELLING

## 2024-11-04 NOTE — ED Provider Notes (Signed)
 " [place] EMERGENCY DEPARTMENT AT [facility] Provider Note   CSN: [id number] Arrival date & time: 11/04/24  1026     Patient presents with: Shortness of Breath   Jacob Berger is a 78 y.o. male.   79 year old male history of renal cell carcinoma status post left nephrectomy, CKD, hypertension, hyperlipidemia, CAD status post PCI, and thoracic or aortic aneurysm who presents to the emergency department with chest pain and shortness of breath.  Has been going on for several months.  Saw his outpatient doctor who ordered a CTA of the chest which shows small bilateral PE.  I did call radiology and there is no right heart strain.  Says he is just having some mild chest pain and shortness of breath this point in time.  Does have a history of chronic lower extremity swelling left worse than right after his knee surgery that he had on his left previously.  Not currently on any blood thinners.       Prior to Admission medications  Medication Sig Start Date End Date Taking? Authorizing Provider  APIXABAN  (ELIQUIS ) VTE STARTER PACK (10MG  AND 5MG ) Take as directed on package: start with two-5mg  tablets twice daily for 7 days. On day 8, switch to one-5mg  tablet twice daily. 11/04/24  Yes Yolande Lamar BROCKS, MD  allopurinol  (ZYLOPRIM ) 100 MG tablet Take 100 mg by mouth daily.    [provider]  [Paused] aspirin  EC 81 MG tablet Take 81 mg by mouth daily. Wait to take this until your doctor or other care provider tells you to start again.    [provider]  AZOR  10-40 MG per tablet TAKE 1 TABLET BY MOUTH DAILY 12/18/13   Anner Alm ORN, MD  bimatoprost (LUMIGAN) 0.01 % SOLN Place 1 drop into both eyes at bedtime.    [provider]  Carboxymethylcellulose Sodium (REFRESH LIQUIGEL) 1 % GEL Place 1 drop into both eyes 4 (four) times daily as needed (eye irritation).    [provider]  diphenhydramine -acetaminophen  (TYLENOL  PM) 25-500 MG TABS  tablet Take 1 tablet by mouth at bedtime as needed (sleep).    [provider]  Dulaglutide  (TRULICITY ) 1.5 MG/0.5ML SOAJ Inject 1.5 mg into the skin once a week.    [provider]  fenofibrate  (TRICOR ) 145 MG tablet Take 145 mg by mouth daily.    [provider]  furosemide  (LASIX ) 80 MG tablet Take 20-80 mg by mouth daily as needed for fluid or edema.    [provider]  LANTUS SOLOSTAR 100 UNIT/ML Solostar Pen Inject 18-20 Units into the skin at bedtime. 05/30/24   [provider]  Methylcellulose, Laxative, (CITRUCEL) 500 MG TABS Take 500 mg by mouth daily.    [provider]  nitroGLYCERIN  (NITROSTAT ) 0.4 MG SL tablet Place 1 tablet (0.4 mg total) under the tongue every 5 (five) minutes as needed for chest pain. 07/25/24 10/23/24  Anner Alm ORN, MD  NOVOLOG  FLEXPEN 100 UNIT/ML FlexPen Inject 18-20 Units into the skin 3 (three) times daily before meals. 08/10/16   [provider]  ofloxacin (OCUFLOX) 0.3 % ophthalmic solution Place 1 drop into the left eye at bedtime. 07/22/24   [provider]  olmesartan  (BENICAR ) 20 MG tablet Take 20 mg by mouth daily. 07/29/24   [provider]  sildenafil  (VIAGRA ) 50 MG tablet Take 1 tablet (total 50 mg)  to 1.5 tablet ( total 75 mg) as needed for erectile dysfunction 03/29/20   Anner Alm  W, MD  timolol  (TIMOPTIC ) 0.5 % ophthalmic solution Place 1 drop into both eyes 2 (two) times daily. Morning and afternoon 10/01/17   [provider]  TURMERIC CURCUMIN PO Take 1 each by mouth daily.    [provider]  VYTORIN  10-40 MG per tablet Take 1 tablet by mouth daily. 04/27/15   [provider]    Allergies: Accupril [quinapril hcl]    Review of Systems  Updated Vital Signs BP 127/62   Pulse (!) 59   Temp 98 F (36.7 C)   Resp 12   Ht 6' (1.829 m)   Wt 124.7 kg   SpO2 100%   BMI 37.30 kg/m   Physical Exam Vitals and nursing note reviewed.   Constitutional:      General: He is not in acute distress.    Appearance: He is well-developed.  HENT:     Head: Normocephalic and atraumatic.     Right Ear: External ear normal.     Left Ear: External ear normal.     Nose: Nose normal.  Eyes:     Extraocular Movements: Extraocular movements intact.     Conjunctiva/sclera: Conjunctivae normal.     Pupils: Pupils are equal, round, and reactive to light.  Cardiovascular:     Rate and Rhythm: Normal rate and regular rhythm.     Heart sounds: Normal heart sounds.  Pulmonary:     Effort: Pulmonary effort is normal. No respiratory distress.     Breath sounds: Normal breath sounds.  Musculoskeletal:     Cervical back: Normal range of motion and neck supple.     Right lower leg: Edema present.     Left lower leg: Edema present.  Skin:    General: Skin is warm and dry.  Neurological:     Mental Status: He is alert. Mental status is at baseline.  Psychiatric:        Mood and Affect: Mood normal.        Behavior: Behavior normal.     (all labs ordered are listed, but only abnormal results are displayed) Labs Reviewed  CBC WITH DIFFERENTIAL/PLATELET - Abnormal; Notable for the following components:      Result Value   Hemoglobin 12.6 (*)    HCT 38.2 (*)    All other components within normal limits  COMPREHENSIVE METABOLIC PANEL WITH GFR - Abnormal; Notable for the following components:   Glucose, Bld 182 (*)    BUN 31 (*)    Creatinine, Ser 1.65 (*)    GFR, Estimated 42 (*)    All other components within normal limits  TROPONIN T, HIGH SENSITIVITY - Abnormal; Notable for the following components:   Troponin T High Sensitivity 31 (*)    All other components within normal limits  TROPONIN T, HIGH SENSITIVITY - Abnormal; Notable for the following components:   Troponin T High Sensitivity 37 (*)    All other components within normal limits  PRO BRAIN NATRIURETIC PEPTIDE    EKG: None  Radiology: VAS US  LOWER EXTREMITY  VENOUS (DVT) (ONLY [facility] & [facility]) Result Date: 11/04/2024  Lower Venous DVT Study Patient Name:  Jacob Berger  Date of Exam:   11/04/2024 Medical Rec #: [id number]            Accession #:    [id number] Date of Birth: 1946/04/01            Patient Gender: M Patient Age:   22 years Exam Location:  [facility]  Procedure:      VAS US  LOWER EXTREMITY VENOUS (DVT) Referring Phys: LAMAR Kelcey Wickstrom --------------------------------------------------------------------------------  Indications: Pulmonary embolism.  Risk Factors: CAD - stents. Anticoagulation: Eliquis . Comparison Study: No recent studies Performing Technologist: Ricka Sturdivant-Jones RDMS, RVT  Examination Guidelines: A complete evaluation includes B-mode imaging, spectral Doppler, color Doppler, and power Doppler as needed of all accessible portions of each vessel. Bilateral testing is considered an integral part of a complete examination. Limited examinations for reoccurring indications may be performed as noted. The reflux portion of the exam is performed with the patient in reverse Trendelenburg.  +---------+---------------+---------+-----------+----------+--------------+ RIGHT    CompressibilityPhasicitySpontaneityPropertiesThrombus Aging +---------+---------------+---------+-----------+----------+--------------+ CFV      Full           Yes      Yes                                 +---------+---------------+---------+-----------+----------+--------------+ SFJ      Full                                                        +---------+---------------+---------+-----------+----------+--------------+ FV Prox  Full                                                        +---------+---------------+---------+-----------+----------+--------------+ FV Mid   Full           Yes      Yes                                 +---------+---------------+---------+-----------+----------+--------------+ FV DistalFull                                                         +---------+---------------+---------+-----------+----------+--------------+ PFV      Full                                                        +---------+---------------+---------+-----------+----------+--------------+ POP      Full           Yes      Yes                                 +---------+---------------+---------+-----------+----------+--------------+ PTV      Full                                                        +---------+---------------+---------+-----------+----------+--------------+ PERO     Full                                                        +---------+---------------+---------+-----------+----------+--------------+   +---------+---------------+---------+-----------+----------+--------------+  LEFT     CompressibilityPhasicitySpontaneityPropertiesThrombus Aging +---------+---------------+---------+-----------+----------+--------------+ CFV      Full           Yes      Yes                                 +---------+---------------+---------+-----------+----------+--------------+ SFJ      Full                                                        +---------+---------------+---------+-----------+----------+--------------+ FV Prox  Full                                                        +---------+---------------+---------+-----------+----------+--------------+ FV Mid   Full           Yes      Yes                                 +---------+---------------+---------+-----------+----------+--------------+ FV DistalFull                                                        +---------+---------------+---------+-----------+----------+--------------+ PFV      Full                                                        +---------+---------------+---------+-----------+----------+--------------+ POP      Full           Yes      Yes                                  +---------+---------------+---------+-----------+----------+--------------+ PTV      Full                                                        +---------+---------------+---------+-----------+----------+--------------+ PERO     Full                                                        +---------+---------------+---------+-----------+----------+--------------+     Summary: BILATERAL: - No evidence of deep vein thrombosis seen in the lower extremities, bilaterally. -No evidence of popliteal cyst, bilaterally.   *See table(s) above for measurements and observations.    Preliminary    CT ANGIO CHEST AORTA W/CM & OR WO/CM Addendum Date: 11/04/2024 ADDENDUM  REPORT: 11/04/2024 09:57 ADDENDUM: Critical Value/emergent results were called by telephone at the time of interpretation on 11/04/2024 at 9:57 am to provider Medford Nanas, who verbally acknowledged these results. Electronically Signed   By: Lynwood Landy Raddle M.D.   On: 11/04/2024 09:57   Result Date: 11/04/2024 CLINICAL DATA:  Thoracic aortic aneurysm EXAM: CT ANGIOGRAPHY CHEST WITH CONTRAST TECHNIQUE: Multidetector CT imaging of the chest was performed using the standard protocol during bolus administration of intravenous contrast. Multiplanar CT image reconstructions and MIPs were obtained to evaluate the vascular anatomy. RADIATION DOSE REDUCTION: This exam was performed according to the departmental dose-optimization program which includes automated exposure control, adjustment of the mA and/or kV according to patient size and/or use of iterative reconstruction technique. CONTRAST:  75mL OMNIPAQUE  IOHEXOL  350 MG/ML SOLN COMPARISON:  July 03, 2024 FINDINGS: Cardiovascular: Stable 4.5 cm ascending thoracic aortic aneurysm. No dissection is noted. Stable 4.1 cm proximal descending thoracic aortic aneurysm. Great vessels are widely patent. Aortic atherosclerosis. Tortuosity of descending thoracic aorta is noted. Normal cardiac size. Coronary  artery calcifications are noted. Small bilateral pulmonary emboli are noted, most prominently seen in right lower and middle lobe branches. Mediastinum/Nodes: No enlarged mediastinal, hilar, or axillary lymph nodes. Thyroid  gland, trachea, and esophagus demonstrate no significant findings. Lungs/Pleura: No pneumothorax or pleural effusion is noted. Stable Peri fissural nodule seen in left lower lobe which can be considered benign at this point. Stable left lower lobe scarring. No definite acute abnormality seen. Upper Abdomen: 3.0 x 2.5 cm hyperdense mass is noted in upper pole of right kidney concerning for neoplasm as described on prior studies. Status post left nephrectomy. Musculoskeletal: No chest wall abnormality. No acute or significant osseous findings. Review of the MIP images confirms the above findings. IMPRESSION: 1. Small bilateral pulmonary emboli are noted, most prominently seen in right lower and middle lobe branches. 2. Grossly stable 4.5 cm ascending thoracic aortic aneurysm and 4.1 cm proximal descending thoracic aortic aneurysm. Recommend semi-annual imaging followup by CTA or MRA and referral to cardiothoracic surgery if not already obtained. This recommendation follows 2010 ACCF/AHA/AATS/ACR/ASA/SCA/SCAI/SIR/STS/SVM Guidelines for the Diagnosis and Management of Patients With Thoracic Aortic Disease. Circulation. 2010; 121: Z733-z630. Aortic aneurysm NOS (ICD10-I71.9). 3. Coronary artery calcifications are noted suggesting coronary artery disease. 4. 3.0 x 2.5 cm hyperdense mass is noted in upper pole of right kidney concerning for neoplasm as described on prior studies. 5. Aortic atherosclerosis. Aortic Atherosclerosis (ICD10-I70.0). Electronically Signed: By: Lynwood Landy Raddle M.D. On: 11/04/2024 09:42     Procedures   Medications Ordered in the ED  apixaban  (ELIQUIS ) tablet 10 mg (10 mg Oral Given 11/04/24 1236)    Followed by  apixaban  (ELIQUIS ) tablet 5 mg (has no administration in  time range)    Clinical Course as of 11/04/24 1555  Tue Nov 04, 2024  1210 Lynwood Landy Raddle M.D. contacted from radiology no right heart strain. Bolus timing is adequate and pt does not need repeat CTA PE.  [RP]  1245 Dr Yetta Blanch from hospitalist consulted for admission he will see the patient and evaluate if he meets criteria for admission [RP]  1410 Dr Blanch does not feel that the patient requires admission if repeat troponin is unchanged. [RP]  1527 VAS US  LOWER EXTREMITY VENOUS (DVT) (ONLY [facility] & [facility]) No evidence of DVT. [RP]    Clinical Course User Index [RP] Yolande Lamar BROCKS, MD  Medical Decision Making Amount and/or Complexity of Data Reviewed Radiology:  Decision-making details documented in ED Course.  Risk Prescription drug management. Decision regarding hospitalization.   Jacob Berger is a 78 year old male history of renal cell carcinoma status post left nephrectomy, CKD, hypertension, hyperlipidemia, CAD status post PCI, and thoracic or aortic aneurysm who presents to the emergency department with chest pain and shortness of breath.   Initial Ddx:  PE, right heart strain, hypoxia, MI  MDM/Course:  78 year old male who presents to the emergency department with bilateral PE on CTA.  On exam is overall well-appearing.  Satting well on room air.  Does have bilateral lower extremity edema.  CBC and CMP consistent with prior.  High-sensitivity mildly elevated but stable in the mid 30s.  BNP normal.  Ultrasound of the lower extremities without DVT.  Since the patient has a Pesi score of 119 did consult hospitalist for admission but they felt that he did not meet criteria.  They saw him in consultation and recommended discharge with outpatient follow-up.  Upon re-evaluation patient remains overall well-appearing.  At this point time is asymptomatic.  Will have him follow-up with his primary doctor and cards as an outpatient  This patient  presents to the ED for concern of complaints listed in HPI, this involves an extensive number of treatment options, and is a complaint that carries with it a high risk of complications and morbidity. Disposition including potential need for admission considered.   Dispo: DC Home. Return precautions discussed including, but not limited to, those listed in the AVS. Allowed pt time to ask questions which were answered fully prior to dc.  I have reviewed the patients home medications and made adjustments as needed Additional history obtained from daughter Records reviewed Outpatient Clinic Notes The following labs were independently interpreted: Chemistry and show CKD I independently reviewed the following imaging with scope of interpretation limited to determining acute life threatening conditions related to emergency care: Chest x-ray and agree with the radiologist interpretation with the following exceptions: none I personally reviewed and interpreted cardiac monitoring: normal sinus rhythm  I personally reviewed and interpreted the pt's EKG: see above for interpretation  Consults: Hospitalist Social Determinants of health:  Geriatric  Portions of this note were generated with Scientist, clinical (histocompatibility and immunogenetics). Dictation errors may occur despite best attempts at proofreading.     Final diagnoses:  Other acute pulmonary embolism without acute cor pulmonale (HCC)  Chest pain, unspecified type  Elevated troponin    ED Discharge Orders          Ordered    Ambulatory referral to Cardiothoracic Surgery        11/04/24 1529    APIXABAN  (ELIQUIS ) VTE STARTER PACK (10MG  AND 5MG )       Note to Pharmacy: If starter pack unavailable, substitute with seventy-four 5 mg apixaban  tabs following the above SIG directions.   11/04/24 1544               Yolande Lamar BROCKS, MD 11/04/24 [phone]  "

## 2024-11-04 NOTE — Discharge Instructions (Signed)
 You were seen for a blood clot in your lungs (pulmonary embolism) in the emergency department.   At home, please take the Eliquis  we have prescribed you.    Check your MyChart online for the results of any tests that had not resulted by the time you left the emergency department.   Follow-up with your primary doctor in 2-3 days regarding your visit.  Follow-up with your cardiologist as well.  Return immediately to the emergency department if you experience any of the following: Difficulty breathing, worsening chest pain, bleeding, or any other concerning symptoms.    Thank you for visiting our Emergency Department. It was a pleasure taking care of you today.

## 2024-11-04 NOTE — Telephone Encounter (Signed)
 Patient currently in ED as recommended by DOD Dr. Kate.

## 2024-11-05 LAB — POCT I-STAT CREATININE: Creatinine, Ser: 1.9 mg/dL — ABNORMAL HIGH (ref 0.61–1.24)

## 2024-11-10 ENCOUNTER — Telehealth: Payer: Self-pay | Admitting: Cardiology

## 2024-11-10 NOTE — Telephone Encounter (Signed)
 Pt scheduled hospital f/u from aortic aneursym. Scheduled for Dr. Sibyl next available 1/26. Pt does not feel this is soon enough, but declined to see PA. Please advise.

## 2024-11-10 NOTE — Telephone Encounter (Signed)
 Pt c/o medication issue:  1. Name of Medication: APIXABAN  (ELIQUIS ) VTE STARTER PACK (10MG  AND 5MG )   2. How are you currently taking this medication (dosage and times per day)?   Take as directed on package: start with two-5mg  tablets twice daily for 7 days. On day 8, switch to one-5mg  tablet twice daily.    3. Are you having a reaction (difficulty breathing--STAT)? no  4. What is your medication issue? Pt concerned he will run out before 1/26 appt. Please advise

## 2024-11-10 NOTE — Telephone Encounter (Signed)
 Attempted to call patient back, received message stating phone is not accepting calls because voicemail has not been setup.  Eliquis  started pack Rx sent in on 11/04/24 with qty of 74 tablets. This is enough to cover patient for 29 days and should get him through to his appt with Dr. Anner on 1/26.

## 2024-11-11 ENCOUNTER — Encounter

## 2024-11-26 ENCOUNTER — Ambulatory Visit

## 2024-11-27 NOTE — Progress Notes (Signed)
 No Show

## 2024-12-01 ENCOUNTER — Ambulatory Visit: Admitting: Cardiology

## 2024-12-02 NOTE — Progress Notes (Unsigned)
 " Cardiology Office Note   Date:  12/03/2024  ID:  Elisah, Parmer 07/03/1946, MRN [id number] PCP: Verdia Lombard, MD  [facility] Providers Cardiologist:  Alm Clay, MD     History of Present Illness Aaban Griep is a 79 y.o. male with history of CAD (s/p DES to PDA and 2011, low risk NST in 02/2019, hypertension, hyperlipidemia, OSA on CPAP, renal cell cancer (s/p nephrectomy and 04/2016), and thoracic aortic aneurysm followed by CT surgery 4.5 cm 10/2024.    He initially establish care with Dr. Clay in 2014 after Dr. Bebe retired for management of CAD.  Cardiac catheterization 2011 with PCI to PDA in [place] .  Carotid duplex 12/2016 showed bilateral 1-39% ICA stenosis. Myoview  is in 2018 and 2020 showed no evidence of ischemia or infarction.  He is diagnosed with thoracic aortic aneurysm in 2019 and followed by CT surgery.  He has not multiple unremarkable stress test over the years.   He was last seen in office 07/2024 to be cleared for renal biopsy procedure.  Stress test 08/2024 showed a small defect with mild reduction in uptake present in the apical anterior and apical locations that is reversible, with this study being low risk.  Echo 07/2024 LVEF 60 to 65%, no RWMA, normal RV, mild AR, and severe dilation of ascending aorta measuring 5.6 cm.  Last CT angio of aorta 11/04/2024 showed stable 4.5 cm ascending thoracic aortic aneurysm with no dissection with stable 4.1 cm proximal descending thoracic aortic aneurysm.  He presented to the hospital 11/04/2024 in the setting of chest pain and shortness of breath. CT scan showed small PE with no right ventricular strain.  Lower extremity Dopplers are negative for DVT. Troponin elevated with peak of 37.  Baby aspirin  was held and 18-month Eliquis  recommended.    He presents today for hospital follow-up in the setting of PE. He notes to be doing well since hospitalization. He has not noted shortness of breath  or chest pain. He is getting his Eliquis  refills thorough his PCP. He does not check his BP at home as he does not have a BP cuff. There was a renal mass noted on his right and only kidney. He recently underwent a renal biopsy and this was noted to be benign. He is not established with a nephrologist. He denies missing any Eliquis  doses with the exception of this morning. He was in a rush to get here and forgot. He notes increased swelling in lower extremities in the last two weeks. He has been taking 20 mg furosemide .  He denies chest pain, shortness of breath, fatigue, palpitations, melena, hematuria, hemoptysis, diaphoresis, weakness, presyncope, syncope, orthopnea, and PND.  ROS: All systems negative unless otherwise indicated in HPI.   Studies Reviewed EKG Interpretation Date/Time:  Wednesday December 03 2024 09:20:41 EST Ventricular Rate:  64 PR Interval:  152 QRS Duration:  88 QT Interval:  402 QTC Calculation: 414 R Axis:   34  Text Interpretation: Normal sinus rhythm T wave abnormality, consider inferior ischemia When compared with ECG of 25-Jul-2024 13:28, Criteria for Inferior infarct are no longer Present Nonspecific T wave abnormality no longer evident in Anterior leads Confirmed by Teresa Fish [phone]) on 12/03/2024 9:27:49 AM    Cardiac Studies & Procedures   ______________________________________________________________________________________________   STRESS TESTS  MYOCARDIAL PERFUSION IMAGING 08/07/2024  Interpretation Summary   Stress ECG is diagnostic due to baseline ST-T changes and pharmacological stress protocol.   LV perfusion is abnormal. Defect  1: There is a small defect with mild reduction in uptake present in the apical anterior and apex location(s) that is reversible. There is normal wall motion in the defect area. Consistent with soft tissue attenuation versus subtle ischemia in the distal LAD cannot be ruled out.   Left ventricular function is normal. Nuclear  stress EF: 55%. The left ventricular ejection fraction is normal (55-65%). End diastolic cavity size is enlarged (EDV 130 cc)   Coronary calcium was present on the attenuation correction CT images. Severe coronary calcifications were present. Coronary calcifications were present in the left anterior descending artery, left circumflex artery and right coronary artery distribution(s).  Aortic atherosclerosis.   Prior study available for comparison. 2018, low risk study   The study is low risk.  Small sized, mild intensity, reversible perfusion defect of apical anterior and apex, consider either distal LAD disease or soft tissue attenuation (BMI 37).  Wall motion in this segment is preserved.  No evidence of infarction.  LVEF is 55%, wall thickness preserved, no wall motion abnormalities noted. Overall low risk study.  Clinical correlation required.   ECHOCARDIOGRAM  ECHOCARDIOGRAM COMPLETE 08/14/2024  Narrative ECHOCARDIOGRAM REPORT    Patient Name:   Jonan Seufert Date of Exam: 08/14/2024 Medical Rec #:  [id number]           Height:       72.0 in Accession #:    [id number]          Weight:       271.0 lb Date of Birth:  03/25/1946           BSA:          2.424 m Patient Age:    77 years            BP:           133/84 mmHg Patient Gender: M                   HR:           59 bpm. Exam Location:  [address]  Procedure: 2D Echo, 3D Echo, Cardiac Doppler and Color Doppler (Both Spectral and Color Flow Doppler were utilized during procedure).  Indications:    R94.39 Abnormal Stress Testing R94.31 Abnormal EKG  History:        Patient has prior history of Echocardiogram examinations, most recent 05/10/2016. CAD, Abnormal ECG, Signs/Symptoms:Murmur; Risk Factors:Sleep Apnea, Dyslipidemia, Hypertension, Former Smoker and Diabetes. Ascending Aortic Aneurysm (Prior 4.5cm by CT), Chronic Kidney Disease, Pre-Operative Eval for Renal Surgery, Obesity.  Sonographer:    Heather Hawks  RDCS Referring Phys: JACKEE DEL DICK  IMPRESSIONS   1. Left ventricular ejection fraction, by estimation, is 60 to 65%. Left ventricular ejection fraction by 3D volume is 66 %. The left ventricle has normal function. The left ventricle has no regional wall motion abnormalities. Left ventricular diastolic parameters were normal. 2. Right ventricular systolic function is normal. The right ventricular size is normal. 3. The mitral valve is normal in structure. No evidence of mitral valve regurgitation. No evidence of mitral stenosis. 4. The aortic valve is tricuspid. Aortic valve regurgitation is mild. No aortic stenosis is present. 5. 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 6. The inferior vena cava is dilated in size with <50% respiratory variability, suggesting right atrial pressure of 15 mmHg.  FINDINGS Left Ventricle: Left ventricular ejection fraction, by estimation, is 60 to 65%. Left ventricular ejection fraction by 3D  volume is 66 %. The left ventricle has normal function. The left ventricle has no regional wall motion abnormalities. The left ventricular internal cavity size was normal in size. There is no left ventricular hypertrophy. Left ventricular diastolic parameters were normal.  Right Ventricle: The right ventricular size is normal. No increase in right ventricular wall thickness. Right ventricular systolic function is normal.  Left Atrium: Left atrial size was normal in size.  Right Atrium: Right atrial size was normal in size.  Pericardium: There is no evidence of pericardial effusion.  Mitral Valve: The mitral valve is normal in structure. No evidence of mitral valve regurgitation. No evidence of mitral valve stenosis.  Tricuspid Valve: The tricuspid valve is normal in structure. Tricuspid valve regurgitation is mild . No evidence of tricuspid stenosis.  Aortic Valve: The aortic valve is tricuspid. Aortic valve regurgitation is mild. Aortic  regurgitation PHT measures 603 msec. No aortic stenosis is present. Aortic valve mean gradient measures 7.0 mmHg. Aortic valve peak gradient measures 12.7 mmHg. Aortic valve area, by VTI measures 3.47 cm.  Pulmonic Valve: The pulmonic valve was grossly normal. Pulmonic valve regurgitation is not visualized. No evidence of pulmonic stenosis.  Aorta: The aortic root is normal in size and structure. 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  Venous: The inferior vena cava is dilated in size with less than 50% respiratory variability, suggesting right atrial pressure of 15 mmHg.  IAS/Shunts: No atrial level shunt detected by color flow Doppler.  Additional Comments: 3D was performed not requiring image post processing on an independent workstation and was normal.   LEFT VENTRICLE PLAX 2D LVIDd:         4.60 cm         Diastology LVIDs:         2.50 cm         LV e' medial:    6.14 cm/s LV PW:         1.20 cm         LV E/e' medial:  13.5 LV IVS:        1.30 cm         LV e' lateral:   7.51 cm/s LVOT diam:     2.30 cm         LV E/e' lateral: 11.0 LV SV:         149 LV SV Index:   62 LVOT Area:     4.15 cm        3D Volume EF LV IVRT:       81 msec         LV 3D EF:    Left ventricul ar ejection fraction by 3D volume is 66 %.  3D Volume EF: 3D EF:        66 % LV EDV:       129 ml LV ESV:       44 ml LV SV:        85 ml  RIGHT VENTRICLE RV Basal diam:  3.40 cm     PULMONARY VEINS RV S prime:     15.50 cm/s  Diastolic Velocity: 34.60 cm/s TAPSE (M-mode): 2.2 cm      S/D Velocity:       1.60 RVSP:           31.7 mmHg   Systolic Velocity:  56.20 cm/s  LEFT ATRIUM            Index  RIGHT ATRIUM           Index LA diam:      4.60 cm  1.90 cm/m   RA Pressure: 3.00 mmHg LA Vol (A2C): 103.0 ml 42.49 ml/m  RA Area:     19.20 cm LA Vol (A4C): 47.1 ml  19.43 ml/m  RA Volume:   56.40 ml  23.27 ml/m AORTIC VALVE AV Area (Vmax):    3.62 cm AV  Area (Vmean):   3.33 cm AV Area (VTI):     3.47 cm AV Vmax:           178.00 cm/s AV Vmean:          123.000 cm/s AV VTI:            0.430 m AV Peak Grad:      12.7 mmHg AV Mean Grad:      7.0 mmHg LVOT Vmax:         155.00 cm/s LVOT Vmean:        98.500 cm/s LVOT VTI:          0.360 m LVOT/AV VTI ratio: 0.84 AI PHT:            603 msec  AORTA Ao Root diam: 3.80 cm Ao Asc diam:  5.40 cm  MITRAL VALVE               TRICUSPID VALVE MV Area (PHT): cm         TR Peak grad:   28.7 mmHg MV Decel Time: 284 msec    TR Vmax:        268.00 cm/s MV E velocity: 82.80 cm/s  Estimated RAP:  3.00 mmHg MV A velocity: 94.50 cm/s  RVSP:           31.7 mmHg MV E/A ratio:  0.88 SHUNTS Systemic VTI:  0.36 m Systemic Diam: 2.30 cm  Franck Azobou Tonleu Electronically signed by Joelle Cedars Tonleu Signature Date/Time: 08/14/2024/1:40:47 PM    Final          ______________________________________________________________________________________________      Risk Assessment/Calculations           Physical Exam VS:  BP 132/70 (BP Location: Right Arm, Patient Position: Sitting, Cuff Size: Large)   Pulse 64   Ht 6' (1.829 m)   Wt 273 lb 6.4 oz (124 kg)   SpO2 97%   BMI 37.08 kg/m        Wt Readings from Last 3 Encounters:  12/03/24 273 lb 6.4 oz (124 kg)  11/04/24 275 lb (124.7 kg)  08/20/24 270 lb 15.1 oz (122.9 kg)    GEN: Well nourished, well developed in no acute distress NECK: No JVD; No carotid bruits CARDIAC: RRR, no murmurs, rubs, gallops RESPIRATORY:  Clear to auscultation without rales, wheezing or rhonchi  ABDOMEN: Soft, non-tender, non-distended EXTREMITIES:  No edema; No deformity   ASSESSMENT AND PLAN  CAD- Cardiac catheterization 2011 with PCI to PDA in [place] . -Stable with no anginal symptoms. No indication for ischemic evaluation.  -ASA 81 mg is paused as patient is taking Eliquis . -EKG today SR.  -Continue fenofibrate  145 mg and  ezetimibe -simvistatin 10-40 mg.   Hypertension- BP today 132/70. He is not currently taking his BP at home, as he does not have a BP cuff.   Discussed to monitor BP at home once a week or if symptomatic at least 2 hours after medications and sitting for 5-10 minutes. -He has not taken his olmesartan  today. He  does not have any left and is on a mail order. -Refill for one week supply of olmesartan  20 mg.  -BP cuff ordered and provided from [facility].  - Continue olmesartan  20 mg and furosemide  20 mg daily.   HLD LDL <70- Last LDL 58 on 07/22/24  -Heart healthy diet and regular cardiovascular exercise encouraged.  -Continue fenofibrate  145 mg and ezetimibe -simvistatin 10-40 mg.   Thoracic aortic aneurysm- CT angio of aorta 11/04/2024 showed stable 4.5 cm ascending thoracic aortic aneurysm with no dissection with stable 4.1 cm proximal descending thoracic aortic aneurysm. -BP today well controlled.  -Continue to follow with CT surgery.   OSA on CPAP- Compliant on CPAP.   [facility] the [facility] of chest pain and shortness of breath. CT scan showed small PE with no right ventricular strain.  -Denies shortness of breath, chest pain, and palpitations today.   -Follows with PCP for Eliquis  refills  -Recommend d-dimer or chest CT in 3 months after completing Eliquis  course.  -ASA 81 mg held  -3 month Eliquis  recommended.   Renal cell carcinoma- Nephrectomy in 2017.  -Last creatinine 1.65 on 11/04/24.  - 3.0 x 2.5 cm hyperdense mass is noted in upper pole of right kidney concerning for neoplasm as described on prior studies. Status post left nephrectomy. -Renal biopsy confirmed mass is benign. -Referral for nephrologist.   Low extremity edema- Has noticed increased lower extremity edema in the last two weeks. - Increase furosemide  to 40 mg daily for 3 days. Then resume 20 mg daily.  -Repeat BMET in one week to check kidney function.        Dispo: Follow up  with   Signed, Mardy KATHEE Pizza, FNP  "

## 2024-12-03 ENCOUNTER — Encounter: Payer: Self-pay | Admitting: Physician Assistant

## 2024-12-03 ENCOUNTER — Ambulatory Visit: Attending: Internal Medicine

## 2024-12-03 ENCOUNTER — Other Ambulatory Visit (HOSPITAL_COMMUNITY): Payer: Self-pay

## 2024-12-03 VITALS — BP 132/70 | HR 64 | Ht 72.0 in | Wt 273.4 lb

## 2024-12-03 DIAGNOSIS — R600 Localized edema: Secondary | ICD-10-CM | POA: Insufficient documentation

## 2024-12-03 DIAGNOSIS — Z86711 Personal history of pulmonary embolism: Secondary | ICD-10-CM | POA: Insufficient documentation

## 2024-12-03 DIAGNOSIS — Z9861 Coronary angioplasty status: Secondary | ICD-10-CM | POA: Insufficient documentation

## 2024-12-03 DIAGNOSIS — E785 Hyperlipidemia, unspecified: Secondary | ICD-10-CM | POA: Insufficient documentation

## 2024-12-03 DIAGNOSIS — Z85528 Personal history of other malignant neoplasm of kidney: Secondary | ICD-10-CM | POA: Diagnosis not present

## 2024-12-03 DIAGNOSIS — I2510 Atherosclerotic heart disease of native coronary artery without angina pectoris: Secondary | ICD-10-CM | POA: Insufficient documentation

## 2024-12-03 DIAGNOSIS — Z905 Acquired absence of kidney: Secondary | ICD-10-CM | POA: Diagnosis not present

## 2024-12-03 DIAGNOSIS — I7121 Aneurysm of the ascending aorta, without rupture: Secondary | ICD-10-CM | POA: Diagnosis not present

## 2024-12-03 DIAGNOSIS — I10 Essential (primary) hypertension: Secondary | ICD-10-CM | POA: Insufficient documentation

## 2024-12-03 DIAGNOSIS — G4733 Obstructive sleep apnea (adult) (pediatric): Secondary | ICD-10-CM | POA: Insufficient documentation

## 2024-12-03 MED ORDER — OLMESARTAN MEDOXOMIL 20 MG PO TABS: 20.0000 mg | ORAL_TABLET | Freq: Every day | ORAL | 3 refills | Status: DC

## 2024-12-03 MED ORDER — OLMESARTAN MEDOXOMIL 20 MG PO TABS: 20.0000 mg | ORAL_TABLET | Freq: Every day | ORAL | Status: DC

## 2024-12-03 MED ORDER — OMRON 3 SERIES BP MONITOR DEVI
1.0000 | Freq: Every day | 0 refills | Status: AC
  Filled 2024-12-03: qty 1, 30d supply, fill #0

## 2024-12-03 MED ORDER — OLMESARTAN MEDOXOMIL 20 MG PO TABS
20.0000 mg | ORAL_TABLET | Freq: Every day | ORAL | 3 refills | Status: AC
  Filled 2024-12-03: qty 30, 30d supply, fill #0

## 2024-12-03 NOTE — Patient Instructions (Signed)
 Medication Instructions:  INCREASE LASIX  (FUROSEMIDE ) TO 40 MG (2 TABS) ONCE DAILY FOR 3 DAYS, THEN RESUME 20 MG DAILY  WE WILL SEND 1 WEEK OLMESARTAN  IN TO [facility] ON FIRST FLOOR, THE REST WILL BE SENT TO YOUR REGULAR PHARMACY  *If you need a refill on your cardiac medications before your next appointment, please call your pharmacy*  Lab Work: BMP IN 1 WEEK  If you have labs (blood work) drawn today and your tests are completely normal, you will receive your results only by: MyChart Message (if you have MyChart) OR A paper copy in the mail If you have any lab test that is abnormal or we need to change your treatment, we will call you to review the results.   Follow-Up: At [facility], you and your health needs are our priority.  As part of our continuing mission to provide you with exceptional heart care, our providers are all part of one team.  This team includes your primary Cardiologist (physician) and Advanced Practice Providers or APPs (Physician Assistants and Nurse Practitioners) who all work together to provide you with the care you need, when you need it.  Your next appointment:   6 month(s)  Provider:   Alm Clay, MD    We recommend signing up for the patient portal called MyChart.  Sign up information is provided on this After Visit Summary.  MyChart is used to connect with patients for Virtual Visits (Telemedicine).  Patients are able to view lab/test results, encounter notes, upcoming appointments, etc.  Non-urgent messages can be sent to your provider as well.   To learn more about what you can do with MyChart, go to [email or web address].   Other Instructions WE WILL PUT IN A REFERRAL FOR NEPHROLOGY FOR YOU, THEY WILL CONTACT YOU TO SCHEDULE AN APPOINTMENT

## 2024-12-05 NOTE — Telephone Encounter (Signed)
 [id number] [place], [place] - [address] AT [facility] [address] - PHONE: [phone] - FAX: [phone]   ofloxacin (OCUFLOX) 0.3 % ophthalmic solution 10 mL 0 12/05/2024 --   Sig - Route: Administer 1 drop into left eye at bedtime. - Left Eye   Sent to pharmacy as: ofloxacin (OCUFLOX) 0.3 % ophthalmic solution   E-Prescribing Status: Receipt confirmed by pharmacy (12/05/2024  4:05 PM EST)

## 2024-12-09 NOTE — Telephone Encounter (Addendum)
 Prescription was sent on 12/05/24 (date of initial message). Called pharmacy today to confirm receipt of Rx. It has been waiting for pt to pick up since 12/05/24. Spoke to patient and made pt aware of this, also verified the pharmacy with pt. No further action required at this time.   ----- Message from Randie ORN, COA sent at 12/09/2024  2:05 PM EST ----- Regarding: FW: Refill Needed (Out of Drops)  ----- Message ----- From: Chiquita PARAS Soots, OA Sent: 12/09/2024   2:03 PM EST To: [facility] Clinical Support Subject: Refill Needed (Out of Drops)                   Good afternoon JT Team,   I sent a message last week about Jacob Berger needing a refill on his ofloxacin, but I think it got lost in the inbasket messages. He called and LVM again today stating he is totally out and in need of a refill for his ofloxacin. He is a pt of Dr Alena. Please advise.   Many Thanks,  Chiquita, [place]

## 2024-12-10 ENCOUNTER — Encounter: Payer: Self-pay | Admitting: *Deleted

## 2024-12-10 LAB — BASIC METABOLIC PANEL WITH GFR
BUN/Creatinine Ratio: 15 (ref 10–24)
BUN: 31 mg/dL — ABNORMAL HIGH (ref 8–27)
CO2: 18 mmol/L — ABNORMAL LOW (ref 20–29)
Calcium: 9.8 mg/dL (ref 8.6–10.2)
Chloride: 103 mmol/L (ref 96–106)
Creatinine, Ser: 2.02 mg/dL — ABNORMAL HIGH (ref 0.76–1.27)
Glucose: 115 mg/dL — ABNORMAL HIGH (ref 70–99)
Potassium: 4.2 mmol/L (ref 3.5–5.2)
Sodium: 142 mmol/L (ref 134–144)
eGFR: 33 mL/min/{1.73_m2} — ABNORMAL LOW

## 2024-12-11 ENCOUNTER — Ambulatory Visit: Payer: Self-pay

## 2024-12-11 ENCOUNTER — Other Ambulatory Visit: Payer: Self-pay

## 2024-12-11 DIAGNOSIS — I2510 Atherosclerotic heart disease of native coronary artery without angina pectoris: Secondary | ICD-10-CM

## 2024-12-11 MED ORDER — FUROSEMIDE 20 MG PO TABS: 20.0000 mg | ORAL_TABLET | Freq: Every day | ORAL | 3 refills | Status: AC

## 2024-12-11 MED ORDER — FUROSEMIDE 20 MG PO TABS: 20.0000 mg | ORAL_TABLET | Freq: Every day | ORAL | 3 refills | Status: DC

## 2024-12-11 NOTE — Telephone Encounter (Signed)
-----   Message from Nurse Roxie PARAS, RN sent at 12/11/2024  8:47 AM EST -----  ----- Message ----- From: Teresa Mardy NOVAK, FNP Sent: 12/11/2024   7:46 AM EST To: [facility] Triage  Creatinine has increased to 2.02. Electrolytes stable.  How much furosemide  is he taking daily?  Has he made an appointment with nephrology?

## 2024-12-11 NOTE — Telephone Encounter (Unsigned)
 Spoke to patient, stated he took furosemide  at 40 mg for three days and reverted back to 20 mg. Patient stated he sometimes takes one quarter (20 mg) and sometimes one half (40 mg) of a pill, but couldn't not say with accuracy how often he takes either dose.  Has not spoken to nephrology and referral is still active with no appointment scheduled. Does not weigh daily and is unsure as to whether or not he is retaining fluid.

## 2024-12-11 NOTE — Progress Notes (Signed)
 Spoke with patient. Relayed instructions from Iva, [place]. Patient expressed understanding with plan to take 20 mg furosemide  once daily, increase to 40 mg for three days if swelling increases, then back down to 20 mg daily. New dosage prescription sent in for 20 mg tablets instead of 80 mg. BMP ordered for two to evaluate kidney function. All other patient concerns addressed.

## 2024-12-12 ENCOUNTER — Other Ambulatory Visit: Payer: Self-pay

## 2024-12-12 DIAGNOSIS — I2510 Atherosclerotic heart disease of native coronary artery without angina pectoris: Secondary | ICD-10-CM

## 2025-01-01 ENCOUNTER — Encounter
